# Patient Record
Sex: Male | Born: 1935
Health system: Southern US, Community
[De-identification: ages and names within clinical notes are randomized; demographics above are authoritative.]

## PROBLEM LIST (undated history)

## (undated) DIAGNOSIS — E785 Hyperlipidemia, unspecified: Secondary | ICD-10-CM

## (undated) DIAGNOSIS — K635 Polyp of colon: Secondary | ICD-10-CM

## (undated) DIAGNOSIS — M353 Polymyalgia rheumatica: Secondary | ICD-10-CM

## (undated) DIAGNOSIS — M1990 Unspecified osteoarthritis, unspecified site: Secondary | ICD-10-CM

## (undated) DIAGNOSIS — K759 Inflammatory liver disease, unspecified: Secondary | ICD-10-CM

## (undated) DIAGNOSIS — C61 Malignant neoplasm of prostate: Secondary | ICD-10-CM

## (undated) DIAGNOSIS — C4490 Unspecified malignant neoplasm of skin, unspecified: Secondary | ICD-10-CM

## (undated) DIAGNOSIS — K5790 Diverticulosis of intestine, part unspecified, without perforation or abscess without bleeding: Secondary | ICD-10-CM

## (undated) DIAGNOSIS — C439 Malignant melanoma of skin, unspecified: Secondary | ICD-10-CM

## (undated) DIAGNOSIS — I10 Essential (primary) hypertension: Secondary | ICD-10-CM

## (undated) DIAGNOSIS — E079 Disorder of thyroid, unspecified: Secondary | ICD-10-CM

## (undated) HISTORY — DX: Essential (primary) hypertension: I10

## (undated) HISTORY — PX: CATARACT EXTRACTION: SUR2

## (undated) HISTORY — DX: Unspecified malignant neoplasm of skin, unspecified: C44.90

## (undated) HISTORY — DX: Diverticulosis of intestine, part unspecified, without perforation or abscess without bleeding: K57.90

## (undated) HISTORY — DX: Unspecified osteoarthritis, unspecified site: M19.90

## (undated) HISTORY — DX: Hyperlipidemia, unspecified: E78.5

## (undated) HISTORY — DX: Malignant melanoma of skin, unspecified: C43.9

## (undated) HISTORY — PX: POLYPECTOMY: SHX149

## (undated) HISTORY — DX: Polyp of colon: K63.5

## (undated) HISTORY — DX: Malignant neoplasm of prostate: C61

## (undated) HISTORY — PX: TONSILLECTOMY: SUR1361

## (undated) HISTORY — DX: Disorder of thyroid, unspecified: E07.9

---

## 2004-10-06 ENCOUNTER — Ambulatory Visit: Payer: Self-pay | Admitting: Family Medicine

## 2004-10-13 ENCOUNTER — Ambulatory Visit: Payer: Self-pay | Admitting: Family Medicine

## 2005-02-17 ENCOUNTER — Ambulatory Visit: Payer: Self-pay | Admitting: Family Medicine

## 2005-07-28 ENCOUNTER — Ambulatory Visit: Payer: Self-pay | Admitting: Family Medicine

## 2005-09-19 DIAGNOSIS — C61 Malignant neoplasm of prostate: Secondary | ICD-10-CM

## 2005-09-19 HISTORY — DX: Malignant neoplasm of prostate: C61

## 2005-11-14 ENCOUNTER — Ambulatory Visit: Payer: Self-pay | Admitting: Family Medicine

## 2006-02-16 ENCOUNTER — Ambulatory Visit: Payer: Self-pay | Admitting: Family Medicine

## 2006-03-14 ENCOUNTER — Ambulatory Visit: Payer: Self-pay | Admitting: Family Medicine

## 2006-04-18 ENCOUNTER — Ambulatory Visit: Admission: RE | Admit: 2006-04-18 | Discharge: 2006-05-10 | Payer: Self-pay | Admitting: Radiation Oncology

## 2006-05-03 ENCOUNTER — Ambulatory Visit: Payer: Self-pay | Admitting: Family Medicine

## 2006-06-07 ENCOUNTER — Ambulatory Visit: Payer: Self-pay | Admitting: Family Medicine

## 2006-06-21 ENCOUNTER — Ambulatory Visit: Admission: RE | Admit: 2006-06-21 | Discharge: 2006-11-16 | Payer: Self-pay | Admitting: Radiation Oncology

## 2006-07-27 ENCOUNTER — Encounter: Admission: RE | Admit: 2006-07-27 | Discharge: 2006-07-27 | Payer: Self-pay | Admitting: Urology

## 2006-08-02 ENCOUNTER — Ambulatory Visit: Payer: Self-pay | Admitting: Family Medicine

## 2006-08-03 ENCOUNTER — Ambulatory Visit (HOSPITAL_BASED_OUTPATIENT_CLINIC_OR_DEPARTMENT_OTHER): Admission: RE | Admit: 2006-08-03 | Discharge: 2006-08-03 | Payer: Self-pay | Admitting: Urology

## 2007-01-02 ENCOUNTER — Ambulatory Visit: Payer: Self-pay | Admitting: Family Medicine

## 2007-02-05 ENCOUNTER — Ambulatory Visit: Payer: Self-pay | Admitting: Family Medicine

## 2007-02-06 LAB — CONVERTED CEMR LAB
ALT: 17 units/L (ref 0–40)
BUN: 12 mg/dL (ref 6–23)
CO2: 28 meq/L (ref 19–32)
Chloride: 109 meq/L (ref 96–112)
Creatinine, Ser: 1.1 mg/dL (ref 0.4–1.5)
Sodium: 141 meq/L (ref 135–145)

## 2007-02-28 ENCOUNTER — Ambulatory Visit: Payer: Self-pay | Admitting: Family Medicine

## 2007-02-28 DIAGNOSIS — M1990 Unspecified osteoarthritis, unspecified site: Secondary | ICD-10-CM | POA: Insufficient documentation

## 2007-02-28 DIAGNOSIS — E785 Hyperlipidemia, unspecified: Secondary | ICD-10-CM

## 2007-02-28 DIAGNOSIS — I10 Essential (primary) hypertension: Secondary | ICD-10-CM | POA: Insufficient documentation

## 2007-05-10 ENCOUNTER — Ambulatory Visit: Payer: Self-pay | Admitting: Gastroenterology

## 2007-06-11 ENCOUNTER — Telehealth (INDEPENDENT_AMBULATORY_CARE_PROVIDER_SITE_OTHER): Payer: Self-pay | Admitting: *Deleted

## 2007-06-18 ENCOUNTER — Ambulatory Visit: Payer: Self-pay | Admitting: Family Medicine

## 2007-06-18 DIAGNOSIS — Z8546 Personal history of malignant neoplasm of prostate: Secondary | ICD-10-CM

## 2007-06-18 DIAGNOSIS — M25519 Pain in unspecified shoulder: Secondary | ICD-10-CM

## 2007-06-27 ENCOUNTER — Ambulatory Visit: Payer: Self-pay | Admitting: Family Medicine

## 2007-07-06 ENCOUNTER — Encounter: Payer: Self-pay | Admitting: Family Medicine

## 2007-07-06 ENCOUNTER — Ambulatory Visit: Payer: Self-pay | Admitting: Gastroenterology

## 2007-07-13 ENCOUNTER — Encounter: Payer: Self-pay | Admitting: Family Medicine

## 2007-08-14 ENCOUNTER — Ambulatory Visit: Payer: Self-pay | Admitting: Family Medicine

## 2007-08-14 DIAGNOSIS — E041 Nontoxic single thyroid nodule: Secondary | ICD-10-CM

## 2007-08-17 LAB — CONVERTED CEMR LAB: T3, Free: 3 pg/mL (ref 2.3–4.2)

## 2007-08-20 ENCOUNTER — Encounter: Admission: RE | Admit: 2007-08-20 | Discharge: 2007-08-20 | Payer: Self-pay | Admitting: Family Medicine

## 2007-10-09 ENCOUNTER — Ambulatory Visit: Payer: Self-pay | Admitting: Family Medicine

## 2007-10-11 ENCOUNTER — Encounter: Payer: Self-pay | Admitting: Family Medicine

## 2007-10-15 LAB — CONVERTED CEMR LAB
BUN: 28 mg/dL — ABNORMAL HIGH (ref 6–23)
Hgb A1c MFr Bld: 5.9 % (ref 4.6–6.0)
Sodium: 142 meq/L (ref 135–145)

## 2007-12-25 ENCOUNTER — Ambulatory Visit: Payer: Self-pay | Admitting: Family Medicine

## 2007-12-26 ENCOUNTER — Encounter (INDEPENDENT_AMBULATORY_CARE_PROVIDER_SITE_OTHER): Payer: Self-pay | Admitting: *Deleted

## 2008-01-01 ENCOUNTER — Encounter (INDEPENDENT_AMBULATORY_CARE_PROVIDER_SITE_OTHER): Payer: Self-pay | Admitting: *Deleted

## 2008-02-27 ENCOUNTER — Encounter: Payer: Self-pay | Admitting: Family Medicine

## 2008-03-10 ENCOUNTER — Ambulatory Visit: Payer: Self-pay | Admitting: Internal Medicine

## 2008-03-10 DIAGNOSIS — M25559 Pain in unspecified hip: Secondary | ICD-10-CM

## 2008-03-12 ENCOUNTER — Encounter (INDEPENDENT_AMBULATORY_CARE_PROVIDER_SITE_OTHER): Payer: Self-pay | Admitting: *Deleted

## 2008-03-13 ENCOUNTER — Ambulatory Visit: Payer: Self-pay | Admitting: Family Medicine

## 2008-03-13 ENCOUNTER — Encounter (INDEPENDENT_AMBULATORY_CARE_PROVIDER_SITE_OTHER): Payer: Self-pay | Admitting: *Deleted

## 2008-03-13 DIAGNOSIS — H9190 Unspecified hearing loss, unspecified ear: Secondary | ICD-10-CM | POA: Insufficient documentation

## 2008-03-13 LAB — CONVERTED CEMR LAB
Chloride: 103 meq/L (ref 96–112)
Potassium: 3.6 meq/L (ref 3.5–5.1)
Sodium: 140 meq/L (ref 135–145)

## 2008-03-14 ENCOUNTER — Telehealth (INDEPENDENT_AMBULATORY_CARE_PROVIDER_SITE_OTHER): Payer: Self-pay | Admitting: *Deleted

## 2008-03-14 ENCOUNTER — Encounter: Admission: RE | Admit: 2008-03-14 | Discharge: 2008-03-14 | Payer: Self-pay | Admitting: Family Medicine

## 2008-03-25 ENCOUNTER — Encounter: Payer: Self-pay | Admitting: Internal Medicine

## 2008-05-13 ENCOUNTER — Encounter: Admission: RE | Admit: 2008-05-13 | Discharge: 2008-05-13 | Payer: Self-pay | Admitting: Orthopedic Surgery

## 2008-06-12 ENCOUNTER — Ambulatory Visit: Payer: Self-pay

## 2008-06-12 ENCOUNTER — Ambulatory Visit: Payer: Self-pay | Admitting: Family Medicine

## 2008-06-12 DIAGNOSIS — L03119 Cellulitis of unspecified part of limb: Secondary | ICD-10-CM

## 2008-06-12 DIAGNOSIS — L02419 Cutaneous abscess of limb, unspecified: Secondary | ICD-10-CM | POA: Insufficient documentation

## 2008-06-12 DIAGNOSIS — M79609 Pain in unspecified limb: Secondary | ICD-10-CM | POA: Insufficient documentation

## 2008-06-18 ENCOUNTER — Telehealth (INDEPENDENT_AMBULATORY_CARE_PROVIDER_SITE_OTHER): Payer: Self-pay | Admitting: *Deleted

## 2008-06-25 ENCOUNTER — Ambulatory Visit: Payer: Self-pay | Admitting: Family Medicine

## 2008-06-25 DIAGNOSIS — I8000 Phlebitis and thrombophlebitis of superficial vessels of unspecified lower extremity: Secondary | ICD-10-CM | POA: Insufficient documentation

## 2008-07-21 ENCOUNTER — Encounter: Payer: Self-pay | Admitting: Family Medicine

## 2008-07-22 ENCOUNTER — Ambulatory Visit: Payer: Self-pay | Admitting: Family Medicine

## 2008-07-22 ENCOUNTER — Telehealth (INDEPENDENT_AMBULATORY_CARE_PROVIDER_SITE_OTHER): Payer: Self-pay | Admitting: *Deleted

## 2008-07-27 LAB — CONVERTED CEMR LAB
AST: 20 units/L (ref 0–37)
Alkaline Phosphatase: 67 units/L (ref 39–117)
BUN: 22 mg/dL (ref 6–23)
Basophils Relative: 2.9 % (ref 0.0–3.0)
Bilirubin, Direct: 0.2 mg/dL (ref 0.0–0.3)
Calcium: 9.6 mg/dL (ref 8.4–10.5)
Cholesterol: 110 mg/dL (ref 0–200)
Creatinine, Ser: 1 mg/dL (ref 0.4–1.5)
Eosinophils Absolute: 0.1 10*3/uL (ref 0.0–0.7)
GFR calc Af Amer: 94 mL/min
Glucose, Bld: 87 mg/dL (ref 70–99)
HCT: 42.9 % (ref 39.0–52.0)
Hemoglobin: 14.7 g/dL (ref 13.0–17.0)
LDL Cholesterol: 51 mg/dL (ref 0–99)
Lymphocytes Relative: 29.4 % (ref 12.0–46.0)
MCV: 97.3 fL (ref 78.0–100.0)
Monocytes Relative: 5.4 % (ref 3.0–12.0)
RBC: 4.41 M/uL (ref 4.22–5.81)
RDW: 11.8 % (ref 11.5–14.6)
Total Bilirubin: 0.9 mg/dL (ref 0.3–1.2)
WBC: 5.5 10*3/uL (ref 4.5–10.5)

## 2008-07-29 ENCOUNTER — Encounter (INDEPENDENT_AMBULATORY_CARE_PROVIDER_SITE_OTHER): Payer: Self-pay | Admitting: *Deleted

## 2008-08-27 ENCOUNTER — Ambulatory Visit: Payer: Self-pay | Admitting: Internal Medicine

## 2008-08-27 ENCOUNTER — Encounter (INDEPENDENT_AMBULATORY_CARE_PROVIDER_SITE_OTHER): Payer: Self-pay | Admitting: Orthopedic Surgery

## 2008-08-27 ENCOUNTER — Inpatient Hospital Stay (HOSPITAL_COMMUNITY): Admission: RE | Admit: 2008-08-27 | Discharge: 2008-08-31 | Payer: Self-pay | Admitting: Orthopedic Surgery

## 2008-08-27 ENCOUNTER — Telehealth: Payer: Self-pay | Admitting: Internal Medicine

## 2008-08-27 HISTORY — PX: TOTAL HIP ARTHROPLASTY: SHX124

## 2008-10-21 ENCOUNTER — Ambulatory Visit: Payer: Self-pay | Admitting: Family Medicine

## 2008-10-28 ENCOUNTER — Encounter: Payer: Self-pay | Admitting: Family Medicine

## 2008-10-30 ENCOUNTER — Ambulatory Visit: Payer: Self-pay | Admitting: Family Medicine

## 2008-10-31 ENCOUNTER — Ambulatory Visit: Payer: Self-pay | Admitting: Family Medicine

## 2008-11-07 ENCOUNTER — Encounter: Payer: Self-pay | Admitting: Family Medicine

## 2008-11-13 ENCOUNTER — Ambulatory Visit: Payer: Self-pay | Admitting: Family Medicine

## 2008-11-13 LAB — CONVERTED CEMR LAB
ALT: 14 U/L
AST: 18 U/L
Albumin: 3.8 g/dL
Alkaline Phosphatase: 64 U/L
BUN: 20 mg/dL
Bilirubin, Direct: 0.1 mg/dL
CO2: 29 meq/L
Calcium: 9.4 mg/dL
Chloride: 103 meq/L
Cholesterol: 110 mg/dL
Creatinine, Ser: 1.1 mg/dL
GFR calc Af Amer: 84 mL/min
GFR calc non Af Amer: 70 mL/min
Glucose, Bld: 111 mg/dL — ABNORMAL HIGH
HDL: 45.5 mg/dL
LDL Cholesterol: 49 mg/dL
Potassium: 3.6 meq/L
Sodium: 141 meq/L
Total Bilirubin: 0.9 mg/dL
Total CHOL/HDL Ratio: 2.4
Total Protein: 6.1 g/dL
Triglycerides: 76 mg/dL
VLDL: 15 mg/dL

## 2008-11-17 ENCOUNTER — Encounter: Admission: RE | Admit: 2008-11-17 | Discharge: 2008-11-17 | Payer: Self-pay | Admitting: Family Medicine

## 2008-11-18 ENCOUNTER — Encounter: Payer: Self-pay | Admitting: Family Medicine

## 2008-11-20 ENCOUNTER — Encounter (INDEPENDENT_AMBULATORY_CARE_PROVIDER_SITE_OTHER): Payer: Self-pay | Admitting: *Deleted

## 2008-12-11 ENCOUNTER — Ambulatory Visit: Payer: Self-pay | Admitting: Family Medicine

## 2008-12-31 ENCOUNTER — Inpatient Hospital Stay (HOSPITAL_COMMUNITY): Admission: RE | Admit: 2008-12-31 | Discharge: 2009-01-04 | Payer: Self-pay | Admitting: Orthopedic Surgery

## 2008-12-31 HISTORY — PX: TOTAL KNEE ARTHROPLASTY: SHX125

## 2009-01-13 ENCOUNTER — Encounter: Payer: Self-pay | Admitting: Family Medicine

## 2009-01-14 ENCOUNTER — Encounter: Payer: Self-pay | Admitting: Family Medicine

## 2009-01-22 ENCOUNTER — Ambulatory Visit: Payer: Self-pay | Admitting: Family Medicine

## 2009-01-22 DIAGNOSIS — Z96659 Presence of unspecified artificial knee joint: Secondary | ICD-10-CM

## 2009-01-22 LAB — CONVERTED CEMR LAB

## 2009-03-03 ENCOUNTER — Telehealth (INDEPENDENT_AMBULATORY_CARE_PROVIDER_SITE_OTHER): Payer: Self-pay | Admitting: *Deleted

## 2009-05-08 ENCOUNTER — Encounter: Payer: Self-pay | Admitting: Family Medicine

## 2009-05-14 ENCOUNTER — Ambulatory Visit: Payer: Self-pay | Admitting: Family Medicine

## 2009-05-26 ENCOUNTER — Encounter (INDEPENDENT_AMBULATORY_CARE_PROVIDER_SITE_OTHER): Payer: Self-pay | Admitting: *Deleted

## 2009-05-26 LAB — CONVERTED CEMR LAB
ALT: 19 units/L (ref 0–53)
AST: 24 units/L (ref 0–37)
Albumin: 4 g/dL (ref 3.5–5.2)
BUN: 24 mg/dL — ABNORMAL HIGH (ref 6–23)
Bilirubin, Direct: 0.1 mg/dL (ref 0.0–0.3)
Calcium: 9.1 mg/dL (ref 8.4–10.5)
GFR calc non Af Amer: 62.68 mL/min (ref >60)
HDL: 46.3 mg/dL (ref >39.00)
Sodium: 142 meq/L (ref 135–145)
Total Bilirubin: 1.2 mg/dL (ref 0.3–1.2)
Total Protein: 6.7 g/dL (ref 6.0–8.3)
Triglycerides: 47 mg/dL (ref 0.0–149.0)
VLDL: 9.4 mg/dL (ref 0.0–40.0)

## 2009-06-03 ENCOUNTER — Telehealth: Payer: Self-pay | Admitting: Family Medicine

## 2009-06-15 ENCOUNTER — Encounter: Payer: Self-pay | Admitting: Family Medicine

## 2009-06-19 ENCOUNTER — Telehealth (INDEPENDENT_AMBULATORY_CARE_PROVIDER_SITE_OTHER): Payer: Self-pay | Admitting: *Deleted

## 2009-06-24 ENCOUNTER — Telehealth (INDEPENDENT_AMBULATORY_CARE_PROVIDER_SITE_OTHER): Payer: Self-pay | Admitting: *Deleted

## 2009-10-02 ENCOUNTER — Ambulatory Visit: Payer: Self-pay | Admitting: Family Medicine

## 2009-11-13 ENCOUNTER — Encounter: Payer: Self-pay | Admitting: Family Medicine

## 2009-11-23 ENCOUNTER — Encounter: Payer: Self-pay | Admitting: Family Medicine

## 2009-12-09 ENCOUNTER — Telehealth (INDEPENDENT_AMBULATORY_CARE_PROVIDER_SITE_OTHER): Payer: Self-pay | Admitting: *Deleted

## 2010-03-08 ENCOUNTER — Ambulatory Visit: Payer: Self-pay | Admitting: Family Medicine

## 2010-03-08 DIAGNOSIS — IMO0001 Reserved for inherently not codable concepts without codable children: Secondary | ICD-10-CM | POA: Insufficient documentation

## 2010-03-09 ENCOUNTER — Telehealth: Payer: Self-pay | Admitting: Family Medicine

## 2010-03-09 LAB — CONVERTED CEMR LAB
ALT: 30 units/L (ref 0–53)
AST: 32 units/L (ref 0–37)
Albumin: 3.8 g/dL (ref 3.5–5.2)
Alkaline Phosphatase: 90 units/L (ref 39–117)
BUN: 21 mg/dL (ref 6–23)
Basophils Absolute: 0 10*3/uL (ref 0.0–0.1)
Basophils Relative: 0.2 % (ref 0.0–3.0)
Bilirubin, Direct: 0.2 mg/dL (ref 0.0–0.3)
CO2: 30 meq/L (ref 19–32)
Calcium: 9.4 mg/dL (ref 8.4–10.5)
Chloride: 107 meq/L (ref 96–112)
Creatinine, Ser: 1 mg/dL (ref 0.4–1.5)
Eosinophils Absolute: 0.1 10*3/uL (ref 0.0–0.7)
Eosinophils Relative: 2.3 % (ref 0.0–5.0)
GFR calc non Af Amer: 73.78 mL/min (ref >60)
Glucose, Bld: 99 mg/dL (ref 70–99)
HCT: 37.9 % — ABNORMAL LOW (ref 39.0–52.0)
Hemoglobin: 12.8 g/dL — ABNORMAL LOW (ref 13.0–17.0)
Lymphocytes Relative: 25.8 % (ref 12.0–46.0)
Lymphs Abs: 1.6 10*3/uL (ref 0.7–4.0)
MCHC: 33.7 g/dL (ref 30.0–36.0)
MCV: 94.8 fL (ref 78.0–100.0)
Monocytes Absolute: 0.5 10*3/uL (ref 0.1–1.0)
Monocytes Relative: 8.3 % (ref 3.0–12.0)
Neutro Abs: 3.9 10*3/uL (ref 1.4–7.7)
Neutrophils Relative %: 63.4 % (ref 43.0–77.0)
Platelets: 211 10*3/uL (ref 150.0–400.0)
Potassium: 4.5 meq/L (ref 3.5–5.1)
RBC: 4 M/uL — ABNORMAL LOW (ref 4.22–5.81)
RDW: 13.7 % (ref 11.5–14.6)
Rheumatoid fact SerPl-aCnc: 24.7 intl units/mL — ABNORMAL HIGH (ref 0.0–20.0)
Sed Rate: 34 mm/hr — ABNORMAL HIGH (ref 0–22)
Sodium: 143 meq/L (ref 135–145)
Total Bilirubin: 0.5 mg/dL (ref 0.3–1.2)
Total CK: 59 units/L (ref 7–232)
Total Protein: 6.7 g/dL (ref 6.0–8.3)
WBC: 6.2 10*3/uL (ref 4.5–10.5)

## 2010-03-10 ENCOUNTER — Ambulatory Visit: Payer: Self-pay | Admitting: Family Medicine

## 2010-03-23 ENCOUNTER — Ambulatory Visit: Payer: Self-pay | Admitting: Family Medicine

## 2010-03-31 ENCOUNTER — Emergency Department (HOSPITAL_BASED_OUTPATIENT_CLINIC_OR_DEPARTMENT_OTHER): Admission: EM | Admit: 2010-03-31 | Discharge: 2010-03-31 | Payer: Self-pay | Admitting: Emergency Medicine

## 2010-04-06 ENCOUNTER — Ambulatory Visit: Payer: Self-pay | Admitting: Family Medicine

## 2010-04-12 ENCOUNTER — Ambulatory Visit: Payer: Self-pay | Admitting: Family Medicine

## 2010-04-13 ENCOUNTER — Encounter: Payer: Self-pay | Admitting: Family Medicine

## 2010-04-14 LAB — CONVERTED CEMR LAB: Vit D, 25-Hydroxy: 36 ng/mL (ref 30–89)

## 2010-04-19 ENCOUNTER — Encounter: Admission: RE | Admit: 2010-04-19 | Discharge: 2010-05-28 | Payer: Self-pay | Admitting: Family Medicine

## 2010-04-21 ENCOUNTER — Encounter: Payer: Self-pay | Admitting: Family Medicine

## 2010-04-27 ENCOUNTER — Ambulatory Visit: Payer: Self-pay | Admitting: Family Medicine

## 2010-04-27 ENCOUNTER — Encounter (INDEPENDENT_AMBULATORY_CARE_PROVIDER_SITE_OTHER): Payer: Self-pay | Admitting: *Deleted

## 2010-04-28 ENCOUNTER — Encounter (INDEPENDENT_AMBULATORY_CARE_PROVIDER_SITE_OTHER): Payer: Self-pay | Admitting: *Deleted

## 2010-04-28 DIAGNOSIS — K921 Melena: Secondary | ICD-10-CM

## 2010-05-08 ENCOUNTER — Encounter: Payer: Self-pay | Admitting: Family Medicine

## 2010-05-10 ENCOUNTER — Ambulatory Visit: Payer: Self-pay | Admitting: Family Medicine

## 2010-05-10 DIAGNOSIS — M2550 Pain in unspecified joint: Secondary | ICD-10-CM | POA: Insufficient documentation

## 2010-05-10 DIAGNOSIS — R609 Edema, unspecified: Secondary | ICD-10-CM

## 2010-05-11 ENCOUNTER — Encounter: Payer: Self-pay | Admitting: Family Medicine

## 2010-05-11 LAB — CONVERTED CEMR LAB: Anti Nuclear Antibody(ANA): NEGATIVE

## 2010-05-12 ENCOUNTER — Encounter: Payer: Self-pay | Admitting: Family Medicine

## 2010-05-12 LAB — CONVERTED CEMR LAB
ALT: 23 units/L (ref 0–53)
Albumin: 3.6 g/dL (ref 3.5–5.2)
Chloride: 106 meq/L (ref 96–112)
Creatinine, Ser: 1 mg/dL (ref 0.4–1.5)
Eosinophils Absolute: 0.1 10*3/uL (ref 0.0–0.7)
Eosinophils Relative: 0.6 % (ref 0.0–5.0)
Glucose, Bld: 101 mg/dL — ABNORMAL HIGH (ref 70–99)
HCT: 34.3 % — ABNORMAL LOW (ref 39.0–52.0)
Hemoglobin: 11.6 g/dL — ABNORMAL LOW (ref 13.0–17.0)
Lymphocytes Relative: 13.7 % (ref 12.0–46.0)
Lymphs Abs: 1.1 10*3/uL (ref 0.7–4.0)
MCHC: 34 g/dL (ref 30.0–36.0)
Monocytes Relative: 5.8 % (ref 3.0–12.0)
Neutrophils Relative %: 79.7 % — ABNORMAL HIGH (ref 43.0–77.0)
Platelets: 242 10*3/uL (ref 150.0–400.0)
Sodium: 142 meq/L (ref 135–145)
TSH: 1.63 microintl units/mL (ref 0.35–5.50)
WBC: 8 10*3/uL (ref 4.5–10.5)

## 2010-05-14 ENCOUNTER — Encounter: Payer: Self-pay | Admitting: Family Medicine

## 2010-05-17 ENCOUNTER — Encounter: Payer: Self-pay | Admitting: Family Medicine

## 2010-05-18 ENCOUNTER — Encounter: Payer: Self-pay | Admitting: Family Medicine

## 2010-05-18 DIAGNOSIS — E559 Vitamin D deficiency, unspecified: Secondary | ICD-10-CM

## 2010-05-25 ENCOUNTER — Encounter: Payer: Self-pay | Admitting: Family Medicine

## 2010-06-03 ENCOUNTER — Ambulatory Visit: Payer: Self-pay | Admitting: Family Medicine

## 2010-06-09 DIAGNOSIS — K644 Residual hemorrhoidal skin tags: Secondary | ICD-10-CM | POA: Insufficient documentation

## 2010-06-09 DIAGNOSIS — K5730 Diverticulosis of large intestine without perforation or abscess without bleeding: Secondary | ICD-10-CM | POA: Insufficient documentation

## 2010-06-09 DIAGNOSIS — Z860100 Personal history of colon polyps, unspecified: Secondary | ICD-10-CM | POA: Insufficient documentation

## 2010-06-10 ENCOUNTER — Encounter (INDEPENDENT_AMBULATORY_CARE_PROVIDER_SITE_OTHER): Payer: Self-pay | Admitting: *Deleted

## 2010-06-10 ENCOUNTER — Ambulatory Visit: Payer: Self-pay | Admitting: Gastroenterology

## 2010-06-22 ENCOUNTER — Ambulatory Visit: Payer: Self-pay | Admitting: Gastroenterology

## 2010-06-22 LAB — CONVERTED CEMR LAB: Fecal Occult Bld: NEGATIVE

## 2010-07-02 ENCOUNTER — Telehealth: Payer: Self-pay | Admitting: Family Medicine

## 2010-07-29 ENCOUNTER — Telehealth: Payer: Self-pay | Admitting: Family Medicine

## 2010-07-29 ENCOUNTER — Encounter (INDEPENDENT_AMBULATORY_CARE_PROVIDER_SITE_OTHER): Payer: Self-pay | Admitting: *Deleted

## 2010-08-05 ENCOUNTER — Ambulatory Visit: Payer: Self-pay | Admitting: Family Medicine

## 2010-08-09 LAB — CONVERTED CEMR LAB
Alkaline Phosphatase: 51 units/L (ref 39–117)
BUN: 23 mg/dL (ref 6–23)
Basophils Relative: 0.5 % (ref 0.0–3.0)
Bilirubin, Direct: 0.2 mg/dL (ref 0.0–0.3)
CO2: 26 meq/L (ref 19–32)
Calcium: 9.2 mg/dL (ref 8.4–10.5)
Cholesterol: 126 mg/dL (ref 0–200)
Creatinine, Ser: 1.3 mg/dL (ref 0.4–1.5)
Eosinophils Absolute: 0.1 10*3/uL (ref 0.0–0.7)
Eosinophils Relative: 1.8 % (ref 0.0–5.0)
Glucose, Bld: 95 mg/dL (ref 70–99)
HCT: 41.8 % (ref 39.0–52.0)
HDL: 53.2 mg/dL (ref >39.00)
Lymphocytes Relative: 26.7 % (ref 12.0–46.0)
Monocytes Relative: 9.4 % (ref 3.0–12.0)
Neutrophils Relative %: 61.6 % (ref 43.0–77.0)
Platelets: 170 10*3/uL (ref 150.0–400.0)
RBC: 4.51 M/uL (ref 4.22–5.81)
RDW: 18.7 % — ABNORMAL HIGH (ref 11.5–14.6)
Total CHOL/HDL Ratio: 2
Transferrin: 311.4 mg/dL (ref 212.0–360.0)
VLDL: 14.4 mg/dL (ref 0.0–40.0)
Vitamin B-12: 334 pg/mL (ref 211–911)

## 2010-08-26 ENCOUNTER — Ambulatory Visit: Payer: Self-pay | Admitting: Family Medicine

## 2010-10-17 LAB — CONVERTED CEMR LAB
AST: 17 units/L (ref 0–37)
AST: 26 units/L (ref 0–37)
Albumin: 3.8 g/dL (ref 3.5–5.2)
Albumin: 4.2 g/dL (ref 3.5–5.2)
Alkaline Phosphatase: 53 units/L (ref 39–117)
BUN: 22 mg/dL (ref 6–23)
Basophils Relative: 0.2 % (ref 0.0–1.0)
Basophils Relative: 0.2 % (ref 0.0–3.0)
Bilirubin Urine: NEGATIVE
Bilirubin, Direct: 0.2 mg/dL (ref 0.0–0.3)
Bilirubin, Direct: 0.2 mg/dL (ref 0.0–0.3)
CO2: 33 meq/L — ABNORMAL HIGH (ref 19–32)
Chloride: 105 meq/L (ref 96–112)
Cholesterol: 111 mg/dL (ref 0–200)
Creatinine, Ser: 1.1 mg/dL (ref 0.4–1.5)
Creatinine, Ser: 1.1 mg/dL (ref 0.4–1.5)
Eosinophils Absolute: 0.1 10*3/uL (ref 0.0–0.7)
Eosinophils Relative: 1.8 % (ref 0.0–5.0)
GFR calc non Af Amer: 70 mL/min
Glucose, Bld: 126 mg/dL — ABNORMAL HIGH (ref 70–99)
Glucose, Bld: 98 mg/dL (ref 70–99)
Glucose, Urine, Semiquant: NEGATIVE
HCT: 43.2 % (ref 39.0–52.0)
HDL: 32.3 mg/dL — ABNORMAL LOW (ref >39.00)
Hemoglobin: 12.6 g/dL — ABNORMAL LOW (ref 13.0–17.0)
Hemoglobin: 15.4 g/dL (ref 13.0–17.0)
Ketones, urine, test strip: NEGATIVE
LDL Cholesterol: 65 mg/dL (ref 0–99)
Lymphocytes Relative: 16.4 % (ref 12.0–46.0)
Lymphocytes Relative: 28.4 % (ref 12.0–46.0)
Lymphs Abs: 1.1 10*3/uL (ref 0.7–4.0)
MCHC: 34.4 g/dL (ref 30.0–36.0)
MCV: 92.2 fL (ref 78.0–100.0)
Monocytes Absolute: 0.6 10*3/uL (ref 0.1–1.0)
Monocytes Relative: 9.8 % (ref 3.0–12.0)
Neutrophils Relative %: 71.8 % (ref 43.0–77.0)
Platelets: 155 10*3/uL (ref 150–400)
Potassium: 3.7 meq/L (ref 3.5–5.1)
Potassium: 4.4 meq/L (ref 3.5–5.1)
Protein, U semiquant: NEGATIVE
RBC: 4.55 M/uL (ref 4.22–5.81)
Sed Rate: 32 mm/hr — ABNORMAL HIGH (ref 0–22)
Sodium: 139 meq/L (ref 135–145)
TSH: 1.04 microintl units/mL (ref 0.35–5.50)
Total Bilirubin: 0.7 mg/dL (ref 0.3–1.2)
Total Protein: 6.5 g/dL (ref 6.0–8.3)
Triglycerides: 71 mg/dL (ref 0.0–149.0)
WBC: 4.8 10*3/uL (ref 4.5–10.5)
pH: 5

## 2010-10-19 NOTE — Miscellaneous (Signed)
   Clinical Lists Changes  Problems: Added new problem of UNSPECIFIED VITAMIN D DEFICIENCY (ICD-268.9)

## 2010-10-19 NOTE — Assessment & Plan Note (Signed)
Summary: FOLLOW -UP AND LABS--PH   Vital Signs:  Patient profile:   77 year old male Height:      72.25 inches Weight:      240 pounds Temp:     98.5 degrees F oral Pulse rate:   78 / minute BP sitting:   130 / 78  (left arm)  Vitals Entered By: Jeremy Johann CMA (April 06, 2010 8:11 AM) CC: f/u   History of Present Illness: Pt here f/u being off niaspan and crestor.   Muscle aches are slightly better but still there esp in am and late evening.   Pt was in ER last wednesday for fecal impaction.  Pt taking fiber and drinking fluids.    Current Medications (verified): 1)  Hytrin 1 Mg Caps (Terazosin Hcl) .Marland Kitchen.. 1 By Mouth Qhs 2)  Lisinopril 10 Mg Tabs (Lisinopril) .... Take 1 Tab Once Daily 3)  Eq Aspirin 325 Mg Tabs (Aspirin) .... Take 1 Tab Once Daily 4)  Mvi-Centrum .Marland Kitchen.. 1 By Mouth Once Daily 5)  Vitamin D3 1000 Unit Tabs (Cholecalciferol) .Marland Kitchen.. 1 By Mouth Qam 6)  Crestor 20 Mg Tabs (Rosuvastatin Calcium) .Marland Kitchen.. 1 By Mouth Qpm 7)  Mobic 15 Mg Tabs (Meloxicam) .... 1/2 -1 By Mouth Once Daily  Allergies (verified): No Known Drug Allergies  Past History:  Past medical, surgical, family and social histories (including risk factors) reviewed for relevance to current acute and chronic problems.  Past Medical History: Reviewed history from 03/13/2008 and no changes required. Hyperlipidemia Hypertension Osteoarthritis Prostate cancer, hx of--s/p radiation seed implants.  Past Surgical History: Reviewed history from 03/08/2010 and no changes required. Tonsillectomy Total knee replacement (12/31/2008) Total hip replacement (08/27/2008)  Family History: Reviewed history from 06/18/2007 and no changes required. Family History of CAD Male 1st degree relative 54yo---mother Family History of CAD Male 1st degree relative- 66--- father father-- pancreatic cancer brother-- pancreatic ca 13yo  Social History: Reviewed history from 06/18/2007 and no changes  required. 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 Alert and cooperative with normal attention span and concentration. No apparent delusions, illusions, hallucinations   Impression & Recommendations:  Problem # 1:  MYALGIA (ICD-729.1) Assessment Improved  The following medications were removed from the medication list:    Ultram 50 Mg Tabs (Tramadol hcl) .Marland Kitchen... 1 -2 by mouth every 6 hours hours as needed pain His updated medication list for this problem includes:    Eq Aspirin 325 Mg Tabs (Aspirin) .Marland Kitchen... Take 1 tab once daily    Mobic 15 Mg Tabs (Meloxicam) .Marland Kitchen... 1/2 -1 by mouth once daily consider rheum referral if no improvement  Problem # 2:  HYPERLIPIDEMIA (ICD-272.4)  The following medications were removed from the medication list:    Niaspan 1000 Mg Tbcr (Niacin (antihyperlipidemic)) .Marland Kitchen... 1 once daily    Crestor 20 Mg Tabs (Rosuvastatin calcium) .Marland Kitchen... 1 by mouth at bedtime His updated medication list for this problem includes:    Crestor 20 Mg Tabs (Rosuvastatin calcium) .Marland Kitchen... 1 by mouth qpm  Labs Reviewed: SGOT: 32 (03/08/2010)   SGPT: 30 (03/08/2010)   HDL:46.30 (05/14/2009), 45.5 (10/31/2008)  LDL:46 (05/14/2009), 49 (10/31/2008)  Chol:102 (05/14/2009), 110 (10/31/2008)  Trig:47.0 (05/14/2009), 76 (10/31/2008)  Complete Medication List: 1)  Hytrin 1 Mg Caps (Terazosin hcl) .Marland KitchenMarland KitchenMarland Kitchen  1 by mouth qhs 2)  Lisinopril 10 Mg Tabs (Lisinopril) .... Take 1 tab once daily 3)  Eq Aspirin 325 Mg Tabs (Aspirin) .... Take 1 tab once daily 4)  Mvi-centrum  .Marland KitchenMarland Kitchen. 1 by mouth once daily 5)  Vitamin D3 1000 Unit Tabs (Cholecalciferol) .Marland Kitchen.. 1 by mouth qam 6)  Crestor 20 Mg Tabs (Rosuvastatin calcium) .Marland Kitchen.. 1 by mouth  qpm 7)  Mobic 15 Mg Tabs (Meloxicam) .... 1/2 -1 by mouth once daily  Patient Instructions: 1)  start crestor again 2)  we will start mobic for your arthritis pain---take otc pepcid or prilosec etc with med each day 3)  check labs in 36months----272.4  lipid, bmp, hep 729.1  ESR Prescriptions: MOBIC 15 MG TABS (MELOXICAM) 1/2 -1 by mouth once daily  #30 x 2   Entered and Authorized by:   Loreen Freud DO   Signed by:   Loreen Freud DO on 04/06/2010   Method used:   [facility] to        [facility] [phone]* (retail)       [address]       [place], [place]  [address]       Ph: [phone]       Fax: [phone]   RxID:   [id number] CRESTOR 20 MG TABS (ROSUVASTATIN CALCIUM) 1 by mouth qpm  #30 x 0   Entered and Authorized by:   Loreen Freud DO   Signed by:   Loreen Freud DO on 04/06/2010   Method used:   Historical   RxID:   [id number]

## 2010-10-19 NOTE — Letter (Signed)
[facility]   Imported By: Sherian Rein 05/27/2010 12:16:46  _____________________________________________________________________  External Attachment:    Type:   Image     Comment:   External Document

## 2010-10-19 NOTE — Assessment & Plan Note (Signed)
Summary: swelling hands,feet/cbs   Vital Signs:  Patient profile:   77 year old male Weight:      243 pounds Temp:     98.4 degrees F BP sitting:   120 / 90  (right arm)  Vitals Entered By: Almeta Monas CMA Duncan Dull) (May 10, 2010 11:26 AM) CC: c/o swelling to the hands and feet x2wks, also needs labs pt  is fasting   History of Present Illness: Pt is here c/o swelling in hands and feet about 2 weeks ago.  He went to ortho for hip injection and ortho saw it and wants him to see rheum and have labs done.  Swelling has gone down since.  No other complaints.    Current Medications (verified): 1)  Hytrin 1 Mg Caps (Terazosin Hcl) .Marland Kitchen.. 1 By Mouth Qhs 2)  Lisinopril-Hydrochlorothiazide 10-12.5 Mg Tabs (Lisinopril-Hydrochlorothiazide) .Marland Kitchen.. 1 By Mouth Once Daily 3)  Eq Aspirin 325 Mg Tabs (Aspirin) .... Take 1 Tab Once Daily 4)  Mvi-Centrum .Marland Kitchen.. 1 By Mouth Once Daily 5)  Vitamin D3 1000 Unit Tabs (Cholecalciferol) .Marland Kitchen.. 1 By Mouth Qam 6)  Crestor 20 Mg Tabs (Rosuvastatin Calcium) .Marland Kitchen.. 1 By Mouth Qpm 7)  Mobic 15 Mg Tabs (Meloxicam) .... 1/2 -1 By Mouth Once Daily 8)  Prilosec Otc 20 Mg Tbec (Omeprazole Magnesium) .... Once Daily  Allergies (verified): No Known Drug Allergies  Past History:  Past medical, surgical, family and social histories (including risk factors) reviewed for relevance to current acute and chronic problems.  Past Medical History: Reviewed history from 03/13/2008 and no changes required. Hyperlipidemia Hypertension Osteoarthritis Prostate cancer, hx of--s/p radiation seed implants.  Past Surgical History: Reviewed history from 03/08/2010 and no changes required. Tonsillectomy Total knee replacement (12/31/2008) Total hip replacement (08/27/2008)  Family History: Reviewed history from 06/18/2007 and no changes required. Family History of CAD Male 1st degree relative 54yo---mother Family History of CAD Male 1st degree relative- 13--- father father--  pancreatic cancer brother-- pancreatic ca 63yo  Social History: Reviewed history from 06/18/2007 and no changes required. Retired--Marine,  [employer] Development worker, international aid) Married Never Smoked Alcohol use-yes Drug use-no Regular exercise-yes  Review of Systems      See HPI  Physical Exam  General:  Well-developed,well-nourished,in no acute distress; alert,appropriate and cooperative throughout examination Lungs:  Normal respiratory effort, chest expands symmetrically. Lungs are clear to auscultation, no crackles or wheezes. Extremities:  left pretibial edema and right pretibial edema.  -- trace Psych:  Oriented X3 and normally interactive.     Impression & Recommendations:  Problem # 1:  EDEMA (ICD-782.3) Assessment Improved  His updated medication list for this problem includes:    Lisinopril-hydrochlorothiazide 10-12.5 Mg Tabs (Lisinopril-hydrochlorothiazide) .Marland Kitchen... 1 by mouth once daily  Orders: TLB-BMP (Basic Metabolic Panel-BMET) (80048-METABOL) TLB-CBC Platelet - w/Differential (85025-CBCD) TLB-Hepatic/Liver Function Pnl (80076-HEPATIC) TLB-TSH (Thyroid Stimulating Hormone) (84443-TSH) TLB-CK Total Only(Creatine Kinase/CPK) (82550-CK) TLB-Rheumatoid Factor (RA) (13086-VH) TLB-Sedimentation Rate (ESR) (85652-ESR) T-Antinuclear Antib (ANA) (84696-29528) Rheumatology Referral (Rheumatology)  Discussed elevation of the legs, use of compression stockings, sodium restiction, and medication use.   Problem # 2:  PAIN IN JOINT, MULTIPLE SITES (ICD-719.49)  Orders: TLB-BMP (Basic Metabolic Panel-BMET) (80048-METABOL) TLB-CBC Platelet - w/Differential (85025-CBCD) TLB-Hepatic/Liver Function Pnl (80076-HEPATIC) TLB-TSH (Thyroid Stimulating Hormone) (84443-TSH) TLB-CK Total Only(Creatine Kinase/CPK) (82550-CK) TLB-Rheumatoid Factor (RA) (41324-MW) TLB-Sedimentation Rate (ESR) (85652-ESR) T-Antinuclear Antib (ANA) (10272-53664) Rheumatology Referral (Rheumatology)  Complete Medication  List: 1)  Hytrin 1 Mg Caps (Terazosin hcl) .Marland Kitchen.. 1 by mouth qhs 2)  Lisinopril-hydrochlorothiazide 10-12.5 Mg Tabs (Lisinopril-hydrochlorothiazide) .Marland KitchenMarland KitchenMarland Kitchen  1 by mouth once daily 3)  Eq Aspirin 325 Mg Tabs (Aspirin) .... Take 1 tab once daily 4)  Mvi-centrum  .Marland KitchenMarland Kitchen. 1 by mouth once daily 5)  Vitamin D3 1000 Unit Tabs (Cholecalciferol) .Marland Kitchen.. 1 by mouth qam 6)  Crestor 20 Mg Tabs (Rosuvastatin calcium) .Marland Kitchen.. 1 by mouth qpm 7)  Mobic 15 Mg Tabs (Meloxicam) .... 1/2 -1 by mouth once daily 8)  Prilosec Otc 20 Mg Tbec (Omeprazole magnesium) .... Once daily  Other Orders: Venipuncture ([address]) Prescriptions: LISINOPRIL-HYDROCHLOROTHIAZIDE 10-12.5 MG TABS (LISINOPRIL-HYDROCHLOROTHIAZIDE) 1 by mouth once daily  #30 x 2   Entered and Authorized by:   Loreen Freud DO   Signed by:   Loreen Freud DO on 05/10/2010   Method used:   Print then Give to Patient   RxID:   [id number]   Appended Document: swelling hands,feet/cbs     Clinical Lists Changes  Orders: Added new Service order of Specimen Handling (95621) - Signed

## 2010-10-19 NOTE — Consult Note (Signed)
Summary: [facility]   Imported By: Lanelle Bal 05/27/2010 12:37:07  _____________________________________________________________________  External Attachment:    Type:   Image     Comment:   External Document

## 2010-10-19 NOTE — Progress Notes (Signed)
Summary: Refill Request  Phone Note Refill Request Message from:  [facility] Fax #: [phone]  Refills Requested: Medication #1:  LISINOPRIL-HYDROCHLOROTHIAZIDE 10-12.5 MG TABS 1 by mouth once daily   Dosage confirmed as above?Dosage Confirmed   Supply Requested: 3 months   Notes: 3 refills Next Appointment Scheduled: none Initial call taken by: Harold Barban,  December 09, 2009 8:24 AM    Prescriptions: LISINOPRIL-HYDROCHLOROTHIAZIDE 10-12.5 MG TABS (LISINOPRIL-HYDROCHLOROTHIAZIDE) 1 by mouth once daily  #90 x 0   Entered by:   Army Fossa CMA   Authorized by:   Loreen Freud DO   Signed by:   Army Fossa CMA on 12/09/2009   Method used:   Re-Faxed to ...       [facility])       [address] [address]       [place], [place]  [address]       Ph: [phone]       Fax: [phone]   RxID:   [id number] LISINOPRIL-HYDROCHLOROTHIAZIDE 10-12.5 MG TABS (LISINOPRIL-HYDROCHLOROTHIAZIDE) 1 by mouth once daily  #90 x 0   Entered by:   Army Fossa CMA   Authorized by:   Loreen Freud DO   Signed by:   Army Fossa CMA on 12/09/2009   Method used:   Print then Give to Patient   RxID:   [id number]

## 2010-10-19 NOTE — Letter (Signed)
Summary: [facility]: Immunoassay Fecal Occult Blood (iFOB) Order [facility]  [address] [place], [place] [address]   Phone: [phone]  Fax: [phone]      [facility]: Immunoassay Fecal Occult Blood (iFOB) Order Form   June 10, 2010 MRN: [id number]   Andrew Ramos June 10, 1932   Physicican Name:Dr. Sheryn Bison  Diagnosis Code:792.1/564.0      [name] CMA (AAMA)

## 2010-10-19 NOTE — Assessment & Plan Note (Signed)
Summary: COUGH, RUNNY NOSE, NO FEVER///SPH   Vital Signs:  Patient profile:   77 year old male Weight:      240.2 pounds O2 Sat:      96 % Temp:     97.9 degrees F oral Pulse rate:   76 / minute Pulse rhythm:   regular BP sitting:   120 / 78  (left arm)  Vitals Entered By: Almeta Monas CMA Duncan Dull) (June 03, 2010 3:39 PM) CC: c/o cough, congestion, coughing up phelgm, runny nose and nasal drainage, URI symptoms   History of Present Illness:       This is a 77 year old man who presents with URI symptoms.  The symptoms began 1 week ago.  The patient complains of nasal congestion, purulent nasal discharge, sore throat, productive cough, earache, and sick contacts.  The patient denies fever, low-grade fever (<100.5 degrees), fever of 100.5-103 degrees, fever of 103.1-104 degrees, fever to >104 degrees, stiff neck, dyspnea, wheezing, rash, vomiting, diarrhea, use of an antipyretic, and response to antipyretic.  The patient denies itchy watery eyes, itchy throat, sneezing, seasonal symptoms, response to antihistamine, headache, muscle aches, and severe fatigue.  The patient denies the following risk factors for Strep sinusitis: unilateral facial pain, unilateral nasal discharge, poor response to decongestant, double sickening, tooth pain, Strep exposure, tender adenopathy, and absence of cough.    Current Medications (verified): 1)  Hytrin 1 Mg Caps (Terazosin Hcl) .Marland Kitchen.. 1 By Mouth Qhs 2)  Lisinopril-Hydrochlorothiazide 10-12.5 Mg Tabs (Lisinopril-Hydrochlorothiazide) .Marland Kitchen.. 1 By Mouth Once Daily 3)  Eq Aspirin 325 Mg Tabs (Aspirin) .... Take 1 Tab Once Daily 4)  Mvi-Centrum .Marland Kitchen.. 1 By Mouth Once Daily 5)  Vitamin D3 1000 Unit Tabs (Cholecalciferol) .Marland Kitchen.. 1 By Mouth Qam 6)  Crestor 20 Mg Tabs (Rosuvastatin Calcium) .Marland Kitchen.. 1 By Mouth Qpm 7)  Prednisone 20 Mg Tabs (Prednisone) .... By Mouth For 2 Weeks Then 15 Mg For 2 Weeks 8)  Zithromax Z-Pak 250 Mg Tabs (Azithromycin) .... As Directed 9)   Delsym 30 Mg/29ml Lqcr (Dextromethorphan Polistirex) 10)  Mucinex Dm 30-600 Mg Xr12h-Tab (Dextromethorphan-Guaifenesin)  Allergies (verified): No Known Drug Allergies  Past History:  Past medical, surgical, family and social histories (including risk factors) reviewed for relevance to current acute and chronic problems.  Past Medical History: Reviewed history from 03/13/2008 and no changes required. Hyperlipidemia Hypertension Osteoarthritis Prostate cancer, hx of--s/p radiation seed implants.  Past Surgical History: Reviewed history from 03/08/2010 and no changes required. Tonsillectomy Total knee replacement (12/31/2008) Total hip replacement (08/27/2008)  Family History: Reviewed history from 06/18/2007 and no changes required. Family History of CAD Male 1st degree relative 54yo---mother Family History of CAD Male 1st degree relative- 30--- father father-- pancreatic cancer brother-- pancreatic ca 28yo  Social History: Reviewed history from 06/18/2007 and no changes required. Retired--Marine,  [employer] Development worker, international aid) Married Never Smoked Alcohol use-yes Drug use-no Regular exercise-yes  Review of Systems      See HPI  Physical Exam  General:  Well-developed,well-nourished,in no acute distress; alert,appropriate and cooperative throughout examination Ears:  + b/l hearing aids Nose:  External nasal examination shows no deformity or inflammation. Nasal mucosa are pink and moist without lesions or exudates. Mouth:  pharyngeal erythema and postnasal drip.   Neck:  cervical lymphadenopathy.   Lungs:  + rhonci scattered Heart:  Normal rate and regular rhythm. S1 and S2 normal without gallop, murmur, click, rub or other extra sounds. Psych:  Cognition and judgment appear intact. Alert and cooperative with normal attention  span and concentration. No apparent delusions, illusions, hallucinations   Impression & Recommendations:  Problem # 1:  BRONCHITIS- ACUTE  (ICD-466.0)  His updated medication list for this problem includes:    Zithromax Z-pak 250 Mg Tabs (Azithromycin) .Marland Kitchen... As directed    Delsym 30 Mg/52ml Lqcr (Dextromethorphan polistirex)    Mucinex Dm 30-600 Mg Xr12h-tab (Dextromethorphan-guaifenesin)  Take antibiotics and other medications as directed. Encouraged to push clear liquids, get enough rest, and take acetaminophen as needed. To be seen in 5-7 days if no improvement, sooner if worse.  Complete Medication List: 1)  Hytrin 1 Mg Caps (Terazosin hcl) .Marland Kitchen.. 1 by mouth qhs 2)  Lisinopril-hydrochlorothiazide 10-12.5 Mg Tabs (Lisinopril-hydrochlorothiazide) .Marland Kitchen.. 1 by mouth once daily 3)  Eq Aspirin 325 Mg Tabs (Aspirin) .... Take 1 tab once daily 4)  Mvi-centrum  .Marland KitchenMarland Kitchen. 1 by mouth once daily 5)  Vitamin D3 1000 Unit Tabs (Cholecalciferol) .Marland Kitchen.. 1 by mouth qam 6)  Crestor 20 Mg Tabs (Rosuvastatin calcium) .Marland Kitchen.. 1 by mouth qpm 7)  Prednisone 20 Mg Tabs (Prednisone) .... By mouth for 2 weeks then 15 mg for 2 weeks 8)  Zithromax Z-pak 250 Mg Tabs (Azithromycin) .... As directed 9)  Delsym 30 Mg/5ml Lqcr (Dextromethorphan polistirex) 10)  Mucinex Dm 30-600 Mg Xr12h-tab (Dextromethorphan-guaifenesin) Prescriptions: ZITHROMAX Z-PAK 250 MG TABS (AZITHROMYCIN) as directed  #1 x 0   Entered and Authorized by:   Loreen Freud DO   Signed by:   Loreen Freud DO on 06/03/2010   Method used:   [facility] to        [facility] [phone]* (retail)       [address]       [place], [place]  [address]       Ph: [phone]       Fax: [phone]   RxID:   [id number]

## 2010-10-19 NOTE — Letter (Signed)
[facility]: [facility]   Imported By: Lanelle Bal 11/23/2009 13:07:57  _____________________________________________________________________  External Attachment:    Type:   Image     Comment:   External Document

## 2010-10-19 NOTE — Assessment & Plan Note (Signed)
Summary: guaiac Pos Stool--ch.    History of Present Illness Visit Type: Initial Consult Primary GI MD: Sheryn Bison MD FACP FAGA Primary Provider: Loreen Freud DO Requesting Provider: Loreen Freud DO Chief Complaint: Postiive hemoccult on exam,Pt went to ER for imacted bowel in July, states he had to have a digital disimpaction, feels like that is the cause for the positive hemoccult test History of Present Illness:   Very Pleasant 77 year old Caucasian male with recently diagnosed polymyalgia rheumatica and currently is on low-dose prednisone. He has a history of severe osteoarthritis with previous bilateral knee replacements and left hip. He has had intermittently guaiac positive stools with colonoscopy in 2005 showing adenomatous polyps which were removed, negative followup exam in 2008.  He recently has had worsening constipation related to tramadol use for arthritis. He actually had impaction requiring enemas and manual disimpaction and within the last month. He is followed by endocrinology for thyroid nodules. He continues to be constipated but has no abdominal or rectal pain. He has not had any rectal bleeding, upper gastrointestinal, or hepatobiliary complaints. He is on aspirin 325 mg a day for prophylaxis. He denies previous MI or CVA. He does have well controlled essential hypertension and hyperlipidemia.   GI Review of Systems      Denies abdominal pain, acid reflux, belching, bloating, chest pain, dysphagia with liquids, dysphagia with solids, heartburn, loss of appetite, nausea, vomiting, vomiting blood, weight loss, and  weight gain.      Reports constipation.     Denies anal fissure, black tarry stools, change in bowel habit, diarrhea, diverticulosis, fecal incontinence, heme positive stool, hemorrhoids, irritable bowel syndrome, jaundice, light color stool, liver problems, rectal bleeding, and  rectal pain.    Current Medications (verified): 1)  Hytrin 1 Mg Caps  (Terazosin Hcl) .Marland Kitchen.. 1 By Mouth Qhs 2)  Lisinopril-Hydrochlorothiazide 10-12.5 Mg Tabs (Lisinopril-Hydrochlorothiazide) .Marland Kitchen.. 1 By Mouth Once Daily 3)  Eq Aspirin 325 Mg Tabs (Aspirin) .... Take 1 Tab Once Daily 4)  Mvi-Centrum .Marland Kitchen.. 1 By Mouth Once Daily 5)  Vitamin D3 1000 Unit Tabs (Cholecalciferol) .Marland Kitchen.. 1 By Mouth Qam 6)  Crestor 20 Mg Tabs (Rosuvastatin Calcium) .Marland Kitchen.. 1 By Mouth Qpm 7)  Prednisone 20 Mg Tabs (Prednisone) .... By Mouth For 2 Weeks Then 15 Mg For 2 Weeks  Allergies (verified): No Known Drug Allergies  Past History:  Past medical, surgical, family and social histories (including risk factors) reviewed for relevance to current acute and chronic problems.  Past Medical History: Hyperlipidemia Hypertension Osteoarthritis Prostate cancer, hx of--s/p radiation seed implants. Colon polyps - Tubular Adenoma 2005 blood clots thyroid disease  Past Surgical History: Reviewed history from 03/08/2010 and no changes required. Tonsillectomy Total knee replacement (12/31/2008) Total hip replacement (08/27/2008)  Family History: Reviewed history from 06/18/2007 and no changes required. Family History of CAD Male 1st degree relative 54yo---mother Family History of CAD Male 1st degree relative- 27--- father father-- pancreatic cancer brother-- pancreatic ca 28yo  Social History: Reviewed history from 06/18/2007 and no changes required. 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  The patient denies allergy/sinus, anemia, anxiety-new, back pain, blood in urine, breast changes/lumps, change in vision, confusion, coughing up blood, depression-new, fainting, fatigue, fever, headaches-new, heart murmur, heart rhythm changes, itching, night sweats, nosebleeds, shortness of  breath, skin rash, sleeping problems, sore throat,  swollen lymph glands, thirst - excessive, urination - excessive, urination changes/pain, urine leakage, vision changes, and voice change.         He recently completed a course of Z-Pak for pneumonitis. He also has a history of radiation seed implants for prostate carcinoma. Review of his colonoscopies as not mentioned significant radiation proctitis.  Vital Signs:  Patient profile:   77 year old male Height:      73 inches Weight:      237 pounds BMI:     31.38 BSA:     2.31 Pulse rate:   78 / minute Pulse rhythm:   regular BP sitting:   130 / 74  (left arm)  Vitals Entered By: Merri Ray CMA Duncan Dull) (June 10, 2010 9:06 AM)  Physical Exam  General:  Well developed, well nourished, no acute distress.healthy appearing.   Head:  Normocephalic and atraumatic. Eyes:  PERRLA, no icterus.exam deferred to patient's ophthalmologist.   Neck:  Supple; no masses or thyromegaly. Lungs:  Clear throughout to auscultation. Heart:  Regular rate and rhythm; no murmurs, rubs,  or bruits. Abdomen:  Soft, nontender and nondistended. No masses, hepatosplenomegaly or hernias noted. Normal bowel sounds. Rectal:  Normal exam.hemocult negative.  Hard but not impacted stool present. No obvious fissure or fistula noted. No rectal masses or tenderness. Msk:  decreased ROM and arthritic changes.  lower extremities. Extremities:  No clubbing, cyanosis, edema or deformities noted. Neurologic:  Alert and  oriented x4;  grossly normal neurologically. Cervical Nodes:  No significant cervical adenopathy. Psych:  Alert and cooperative. Normal mood and affect.   Impression & Recommendations:  Problem # 1:  GUAIAC POSITIVE STOOL (ICD-578.1) Assessment Unchanged He denies GI complaints except for new onset constipation. He is on aspirin 325 mg a day and may have a false positive stool guaiac. Review of his labs shows a normal CBC. I will repeat his stool  cards for human hemoglobin will treat him for constipation with [facility] Colon Health tabs b.i.d. and q.h.s. MiraLax 8 ounces as tolerated. He may well need followup colonoscopy exam and endoscopy. Apparently he also was taking Mobic at the time of his previous stool card exams. He currently denies rectal bleeding or any symptoms suggestive of radiation proctitis.  Problem # 2:  BRONCHITIS- ACUTE (ICD-466.0) Assessment: Improved  Problem # 3:  PAIN IN JOINT, MULTIPLE SITES (ICD-719.49) Assessment: Improved recent diagnosis of polymyalgia rheumatica on low-dose prednisone therapy.He Also has severe osteoarthritis in his had previous knee and hip replacements.  Problem # 4:  THYROID NODULE, HX OF (ICD-V12.2) Assessment: Unchanged followed by endocrinology.  Problem # 5:  PROSTATE CANCER, HX OF (ICD-V10.46) Assessment: Unchanged  Patient Instructions: 1)  Copy sent to : Loreen Freud DO and Dr. Clarita Crane at [facility]. 2)  Your prescriptions has been sent to you pharmacy.  3)  Constipation and Hemorrhoids brochure given.  4)  Take Miralax at bedtime. 5)  Take the [facility] one tablet by mouth two times a day. 6)  Please go to the basement today for your labs.  7)  The medication list was reviewed and reconciled.  All changed / newly prescribed medications were explained.  A complete medication list was provided to the patient / caregiver. Prescriptions: MIRALAX  POWD (POLYETHYLENE GLYCOL 3350) mix one capfull in 8 ounces of water and drink at bedtime  #527 x 1   Entered by:   [name] CMA (AAMA)   Authorized by:   Mardella Layman  MD [facility]   Signed by:   Mardella Layman MD [facility] on 06/10/2010   Method used:   [facility]        [facility] [phone][facility] (retail)       [address]       [place], [place]  [address]       Ph: [phone]       Fax: [phone]   RxID:   [id number]

## 2010-10-19 NOTE — Assessment & Plan Note (Signed)
Summary: CONGESTED/SORE THROAT/KN   Vital Signs:  Patient profile:   77 year old male Weight:      248.6 pounds Temp:     97.3 degrees F oral Pulse rate:   83 / minute Pulse rhythm:   regular BP sitting:   130 / 70  (left arm) Cuff size:   large  Vitals Entered By: Almeta Monas CMA Duncan Dull) (August 26, 2010 2:49 PM) CC: x4days c/o cough, sore throat, runny nose and green sputum, Cough   History of Present Illness:  Cough      This is a 77 year old man who presents with Cough.  The symptoms began 4 days ago.  The patient reports productive cough, but denies non-productive cough, pleuritic chest pain, shortness of breath, wheezing, exertional dyspnea, fever, hemoptysis, and malaise.  Associated symtpoms include cold/URI symptoms, sore throat, and chronic rhinitis.  The cough is worse with activity.  Ineffective prior treatments have included OTC cough medication.    Current Medications (verified): 1)  Hytrin 1 Mg Caps (Terazosin Hcl) .Marland Kitchen.. 1 By Mouth Qhs 2)  Lisinopril-Hydrochlorothiazide 10-12.5 Mg Tabs (Lisinopril-Hydrochlorothiazide) .Marland Kitchen.. 1 By Mouth Once Daily 3)  Eq Aspirin 325 Mg Tabs (Aspirin) .... Take 1 Tab Once Daily 4)  Mvi-Centrum .Marland Kitchen.. 1 By Mouth Once Daily 5)  Vitamin D3 1000 Unit Tabs (Cholecalciferol) .Marland Kitchen.. 1 By Mouth Qam 6)  Crestor 20 Mg Tabs (Rosuvastatin Calcium) .Marland Kitchen.. 1 By Mouth Qpm**labs Due Now** 7)  Prednisone 20 Mg Tabs (Prednisone) .... By Mouth For 2 Weeks Then 15 Mg For 2 Weeks 8)  Miralax  Powd (Polyethylene Glycol 3350) .... Mix One Capfull in 8 Ounces of Water and Drink At Bedtime 9)  [employer]Ex  Caps (Probiotic Product) .... Take One By Mouth Two Times A Day 10)  Zithromax Z-Pak 250 Mg Tabs (Azithromycin) .... As Directed  Allergies (verified): No Known Drug Allergies  Past History:  Past Medical History: Last updated: 06/10/2010 Hyperlipidemia Hypertension Osteoarthritis Prostate cancer, hx of--s/p radiation seed implants. Colon  polyps - Tubular Adenoma 2005 blood clots thyroid disease  Past Surgical History: Last updated: 03/08/2010 Tonsillectomy Total knee replacement (12/31/2008) Total hip replacement (08/27/2008)  Family History: Last updated: 06/18/2007 Family History of CAD Male 1st degree relative 54yo---mother Family History of CAD Male 1st degree relative- 81--- father father-- pancreatic cancer brother-- pancreatic ca 54yo  Social History: Last updated: 06/18/2007 Tora Duck,  [employer] worker, international aid) Married Never Smoked Alcohol use-yes Drug use-no Regular exercise-yes  Risk Factors: Alcohol Use: <1 (04/12/2010) Caffeine Use: 0 (04/12/2010) Exercise: yes (04/12/2010)  Risk Factors: Smoking Status: never (04/12/2010) Passive Smoke Exposure: no (04/12/2010)  Family History: Reviewed history from 06/18/2007 and no changes required. Family History of CAD Male 1st degree relative 54yo---mother Family History of CAD Male 1st degree relative- 52--- father father-- pancreatic cancer brother-- pancreatic ca 69yo  Social History: Reviewed history from 06/18/2007 and no changes required. 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  Otoscopic examination reveals clear canals, tympanic membranes are intact bilaterally without bulging, retraction, inflammation or discharge. Hearing is grossly normal bilaterally. Nose:  no external deformity, mucosal erythema, and mucosal edema.   Mouth:  pharyngeal erythema and postnasal drip.   Neck:  No deformities, masses, or tenderness noted. Lungs:  scattered rhonci occassional ext wheeze Psych:  Cognition and judgment appear intact. Alert and cooperative  with normal attention span and concentration. No apparent delusions, illusions, hallucinations   Impression & Recommendations:  Problem # 1:  BRONCHITIS- ACUTE (ICD-466.0)  Take antibiotics and other medications as directed. Encouraged to push clear liquids, get enough rest, and take acetaminophen as needed. To be seen in 5-7 days if no improvement, sooner if worse.  His updated medication list for this problem includes:    Zithromax Z-pak 250 Mg Tabs (Azithromycin) .Marland Kitchen... As directed  Complete Medication List: 1)  Hytrin 1 Mg Caps (Terazosin hcl) .Marland Kitchen.. 1 by mouth qhs 2)  Lisinopril-hydrochlorothiazide 10-12.5 Mg Tabs (Lisinopril-hydrochlorothiazide) .Marland Kitchen.. 1 by mouth once daily 3)  Eq Aspirin 325 Mg Tabs (Aspirin) .... Take 1 tab once daily 4)  Mvi-centrum  .Marland KitchenMarland Kitchen. 1 by mouth once daily 5)  Vitamin D3 1000 Unit Tabs (Cholecalciferol) .Marland Kitchen.. 1 by mouth qam 6)  Crestor 20 Mg Tabs (Rosuvastatin calcium) .Marland Kitchen.. 1 by mouth qpm**labs due now** 7)  Prednisone 20 Mg Tabs (Prednisone) .... By mouth for 2 weeks then 15 mg for 2 weeks 8)  Miralax Powd (Polyethylene glycol 3350) .... Mix one capfull in 8 ounces of water and drink at bedtime 9)  [facility] Colon Health Caps (Probiotic product) .... Take one by mouth two times a day 10)  Zithromax Z-pak 250 Mg Tabs (Azithromycin) .... As directed  Patient Instructions: 1)  Get plenty of rest, drink lots of clear liquids, and use Tylenol or Ibuprofen for fever and comfort. Return in 7-10 days if you're not better: sooner if you'er feeling worse.  2)  Use mucinex or mucinex DM for cough 3)  take claritin or allegra etc for drainage Prescriptions: ZITHROMAX Z-PAK 250 MG TABS (AZITHROMYCIN) as directed  #1 x 0   Entered and Authorized by:   Loreen Freud DO   Signed by:   Loreen Freud DO on 08/26/2010   Method used:   Electronically to        [facility] [phone]* (retail)       [address]       [place], [place]  [address]        Ph: [phone]       Fax: [phone]   RxID:   [id number]    Orders Added: 1)  Est. Patient Level III [60109]

## 2010-10-19 NOTE — Letter (Signed)
Summary: Primary Care Appointment Letter  [place] [facility] [place]  [address] [place], [place] [address]   Phone: [phone]  Fax: [phone]    07/29/2010 MRN: [id number]  Andrew Ramos [address] [place], [place]  [address]  Dear [name],   Your Primary Care Physician Loreen Freud DO has indicated that:    _______it is time to schedule an appointment.    _______you missed your appointment on______ and need to call and          reschedule.    __x_____you need to have lab work done.    _______you need to schedule an appointment discuss lab or test results.    _______you need to call to reschedule your appointment that is                       scheduled on _________.     Please call our office as soon as possible. Our phone number is [phone]. Please press option 1. Our office is open 8a-12noon and 1p-5p, Monday through Friday.     Thank you,    [place] [facility] Scheduler

## 2010-10-19 NOTE — Progress Notes (Signed)
Summary: Lab Results   Phone Note Outgoing Call   Call placed by: Army Fossa CMA,  March 09, 2010 3:42 PM Reason for Call: Discuss lab or test results Summary of Call: Regarding lab results, LMTCB:  ESR  ( sed rate)  slightly elevated----- recheck at pt convenience to make sure not increasing R A ---only very slightly elevated-- nonspecific--- nothing to worry about  Pt is slightly anemic---take mvi with iron daily How are his symptoms? Signed by Loreen Freud DO on 03/09/2010 at 1:30 PM  Follow-up for Phone Call        Pt states he just picked up the meds today so things are about the same. Pt is coming tomorrow to repeat sed rate. Army Fossa CMA  March 09, 2010 3:47 PM

## 2010-10-19 NOTE — Letter (Signed)
Summary: New Patient letter  [facility]  [address] [place], [place] [address]   Phone: [phone]  Fax: [phone]       04/28/2010 MRN: [id number]  Andrew Ramos [address] [place], [place]  [address]  Dear Andrew Ramos,  Welcome to the Gastroenterology Division at [facility].    You are scheduled to see Dr.  Jarold Motto on 06-10-10 at 9:00a.m. on the 3rd floor at [facility], [address]  We ask that you try to arrive at our office 15 minutes prior to your appointment time to allow for check-in.  We would like you to complete the enclosed self-administered evaluation form prior to your visit and bring it with you on the day of your appointment.  We will review it with you.  Also, please bring a complete list of all your medications or, if you prefer, bring the medication bottles and we will list them.  Please bring your insurance card so that we may make a copy of it.  If your insurance requires a referral to see a specialist, please bring your referral form from your primary care physician.  Co-payments are due at the time of your visit and may be paid by cash, check or credit card.     Your office visit will consist of a consult with your physician (includes a physical exam), any laboratory testing he/she may order, scheduling of any necessary diagnostic testing (e.g. x-ray, ultrasound, CT-scan), and scheduling of a procedure (e.g. Endoscopy, Colonoscopy) if required.  Please allow enough time on your schedule to allow for any/all of these possibilities.    If you cannot keep your appointment, please call [phone] to cancel or reschedule prior to your appointment date.  This allows [place] the opportunity to schedule an appointment for another patient in need of care.  If you do not cancel or reschedule by 5 p.m. the business day prior to your appointment date, you will be charged a $50.00 late cancellation/no-show fee.    Thank you for  choosing [facility] for your medical needs.  We appreciate the opportunity to care for you.  Please visit [place] at our website  to learn more about our practice.                     Sincerely,                                                             The Gastroenterology Division

## 2010-10-19 NOTE — Miscellaneous (Signed)
Summary: PT Initial Summary/[facility]  PT Initial [facility]   Imported By: Lanelle Bal 05/04/2010 08:00:45  _____________________________________________________________________  External Attachment:    Type:   Image     Comment:   External Document

## 2010-10-19 NOTE — Letter (Signed)
Summary: [facility]: Immunoassay Fecal Occult Blood (iFOB) Order Form  [place] [facility] [place]  [address] [place], [place] [address]   Phone: [phone]  Fax: [phone]      [facility]: Immunoassay Fecal Occult Blood (iFOB) Order Form   April 27, 2010 MRN: [id number]   DERIC BOCOCK 1931/09/24   Physicican Name:___dr_lowne_____________________  Diagnosis Code:_______v76.51___________________      Jeremy Johann CMA

## 2010-10-19 NOTE — Assessment & Plan Note (Signed)
Summary: follow up med/cbs   Vital Signs:  Patient profile:   77 year old male Height:      72.25 inches (183.52 cm) Weight:      239.38 pounds (108.81 kg) BMI:     32.36 O2 Sat:      96 % on Room air Temp:     98.9 degrees F (37.17 degrees C) oral Pulse rate:   68 / minute BP sitting:   100 / 50  (left arm) Cuff size:   large  Vitals Entered By: [name] (March 23, 2010 3:38 PM)  O2 Flow:  Room air CC: Follow up/discuss meds./kb Is Patient Diabetic? No Pain Assessment Patient in pain? no      Comments Patient notes that he has all listed side effects from Tramadol: sleepiness, dizziness, weight loss, and decreased appetite. Patient also questions if Niaspan has been causing his muscle issues./kb   History of Present Illness: Pt here go over labs and still c/o myalgias.  He has been off crestor for 2 weeks.  Pt also c/o fatigue and he lost 13 lbs in 2 weeks.   Current Medications (verified): 1)  Hytrin 1 Mg Caps (Terazosin Hcl) .Marland Kitchen.. 1 By Mouth Qhs 2)  Niaspan 1000 Mg Tbcr (Niacin (Antihyperlipidemic)) .Marland Kitchen.. 1 Once Daily 3)  Crestor 20 Mg Tabs (Rosuvastatin Calcium) .Marland Kitchen.. 1 Qsh 4)  Lisinopril-Hydrochlorothiazide 10-12.5 Mg Tabs (Lisinopril-Hydrochlorothiazide) .Marland Kitchen.. 1 By Mouth Once Daily 5)  Eq Aspirin 325 Mg Tabs (Aspirin) .... Take 1 Tab Once Daily 6)  Mvi-Centrum .Marland Kitchen.. 1 By Mouth Once Daily 7)  Ultram 50 Mg Tabs (Tramadol Hcl) .Marland Kitchen.. 1 -2 By Mouth Every 6 Hours Hours As Needed Pain 8)  Vitamin D3 1000 Unit Tabs (Cholecalciferol) .Marland Kitchen.. 1 By Mouth Qam  Allergies (verified): No Known Drug Allergies  Past History:  Past medical, surgical, family and social histories (including risk factors) reviewed for relevance to current acute and chronic problems.  Past Medical History: Reviewed history from 03/13/2008 and no changes required. Hyperlipidemia Hypertension Osteoarthritis Prostate cancer, hx of--s/p radiation seed implants.  Past Surgical History: Reviewed history  from 03/08/2010 and no changes required. Tonsillectomy Total knee replacement (12/31/2008) Total hip replacement (08/27/2008)  Family History: Reviewed history from 06/18/2007 and no changes required. Family History of CAD Male 1st degree relative 54yo---mother Family History of CAD Male 1st degree relative- 43--- father father-- pancreatic cancer brother-- pancreatic ca 18yo  Social History: Reviewed history from 06/18/2007 and no changes required. 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     Impression & Recommendations:  Problem # 1:  MYALGIA (ICD-729.1) con't off crestor ---stop niaspan rto 2-3 weeks His updated medication list for this problem includes:    Eq Aspirin 325 Mg Tabs (Aspirin) .Marland Kitchen... Take 1 tab once daily    Ultram 50 Mg Tabs (Tramadol hcl) .Marland Kitchen... 1 -2 by mouth every 6 hours hours as needed pain  Problem # 2:  HYPERTENSION (ICD-401.9)  low today---take 1/2 lisinopril--  pt just filled it  His updated medication list for this problem includes:    Hytrin 1 Mg Caps (Terazosin hcl) .Marland Kitchen... 1 by mouth qhs    Prinzide 10-12.5 Mg Tabs (Lisinopril-hydrochlorothiazide) .Marland Kitchen... 1/2 tab by mouth once  daily  BP today: 100/50 Prior BP: 128/76 (03/08/2010)  Labs Reviewed: K+: 4.5 (03/08/2010) Creat: : 1.0 (03/08/2010)   Chol: 102 (05/14/2009)   HDL: 46.30 (05/14/2009)   LDL: 46 (05/14/2009)   TG: 47.0 (05/14/2009)  Complete Medication List: 1)  Hytrin 1 Mg Caps (Terazosin hcl) .Marland Kitchen.. 1 by mouth qhs 2)  Niaspan 1000 Mg Tbcr (Niacin (antihyperlipidemic)) .Marland Kitchen.. 1 once daily 3)  Crestor 20 Mg Tabs (Rosuvastatin calcium) .Marland Kitchen.. 1 by  mouth at bedtime 4)  Prinzide 10-12.5 Mg Tabs (Lisinopril-hydrochlorothiazide) .... 1/2 tab by mouth once daily 5)  Eq Aspirin 325 Mg Tabs (Aspirin) .... Take 1 tab once daily 6)  Mvi-centrum  .Marland KitchenMarland Kitchen. 1 by mouth once daily 7)  Ultram 50 Mg Tabs (Tramadol hcl) .Marland Kitchen.. 1 -2 by mouth every 6 hours hours as needed pain 8)  Vitamin D3 1000 Unit Tabs (Cholecalciferol) .Marland Kitchen.. 1 by mouth qam  Patient Instructions: 1)  Please schedule a follow-up appointment in 2 weeks.  Prescriptions: LISINOPRIL 10 MG TABS (LISINOPRIL) 1 by mouth once daily  #30 x 2   Entered and Authorized by:   Loreen Freud DO   Signed by:   Loreen Freud DO on 03/23/2010   Method used:   [facility]ically to        [facility] [phone]* (retail)       [address]       [place], [place]  [address]       Ph: [phone]       Fax: [phone]   RxID:   [id number]

## 2010-10-19 NOTE — Assessment & Plan Note (Signed)
Summary: YEARLY EXAM AND FASTING LABS///SPH   Vital Signs:  Patient profile:   77 year old male Height:      73 inches Weight:      242 pounds Temp:     98.2 degrees F oral Pulse rate:   66 / minute BP sitting:   140 / 76  (left arm)  Vitals Entered By: Jeremy Johann CMA (April 12, 2010 9:16 AM) CC: Andrew Ramos  Does patient need assistance? Functional Status Self care, Cook/clean, Shopping, Social activities Ambulation Normal Comments pt is able to do all adls and can read and write.  Vision Screening:      Vision Comments: Pt sees optho q1y-- corrected to 20/20 with glasses 40db HL: Left  Right  Audiometry Comment: Pt has hearing aids b/l      History of Present Illness: Pt here for cpe and f/u.    Hyperlipidemia follow-up      This is a 77 year old man who presents for Hyperlipidemia follow-up.  The patient complains of muscle aches, but denies GI upset, abdominal pain, flushing, itching, constipation, diarrhea, and fatigue.  The patient denies the following symptoms: chest pain/pressure, exercise intolerance, dypsnea, palpitations, syncope, and pedal edema.  Compliance with medications (by patient report) has been near 100%.  Dietary compliance has been good.  Adjunctive measures currently used by the patient include ASA, limiting alcohol consumpton, and weight reduction.    Hypertension follow-up      The patient also presents for Hypertension follow-up.  The patient denies lightheadedness, urinary frequency, headaches, edema, impotence, rash, and fatigue.  The patient denies the following associated symptoms: chest pain, chest pressure, exercise intolerance, dyspnea, palpitations, syncope, leg edema, and pedal edema.  Compliance with medications (by patient report) has been near 100%.  The patient reports that dietary compliance has been good.  The patient reports no exercise.  Adjunctive measures currently used by the patient include salt restriction.    Preventive  Screening-Counseling & Management  Alcohol-Tobacco     Alcohol drinks/day: <1     Alcohol type: OCASSIONALLY     Smoking Status: never     Passive Smoke Exposure: no  Caffeine-Diet-Exercise     Caffeine use/day: 0     Does Patient Exercise: yes     Type of exercise: WALKING     Times/week: 7  Hep-HIV-STD-Contraception     HIV Risk: no     Dental Visit-last 6 months yes     Dental Care Counseling: not indicated; dental care within six months  Safety-Violence-Falls     Seat Belt Use: 100     Firearms in the Home: no firearms in the home     Smoke Detectors: yes     Violence in the Home: no risk noted     Fall Risk: no      Sexual History:  currently monogamous.    Current Medications (verified): 1)  Hytrin 1 Mg Caps (Terazosin Hcl) .Marland Kitchen.. 1 By Mouth Qhs 2)  Lisinopril 10 Mg Tabs (Lisinopril) .... Take 1 Tab Once Daily 3)  Eq Aspirin 325 Mg Tabs (Aspirin) .... Take 1 Tab Once Daily 4)  Mvi-Centrum .Marland Kitchen.. 1 By Mouth Once Daily 5)  Vitamin D3 1000 Unit Tabs (Cholecalciferol) .Marland Kitchen.. 1 By Mouth Qam 6)  Crestor 20 Mg Tabs (Rosuvastatin Calcium) .Marland Kitchen.. 1 By Mouth Qpm 7)  Mobic 15 Mg Tabs (Meloxicam) .... 1/2 -1 By Mouth Once Daily 8)  Prilosec Otc 20 Mg Tbec (Omeprazole Magnesium) .... Once Daily  Allergies (verified): No Known Drug Allergies  Past History:  Past Medical History: Last updated: 03/13/2008 Hyperlipidemia Hypertension Osteoarthritis Prostate cancer, hx of--s/p radiation seed implants.  Past Surgical History: Last updated: 03/08/2010 Tonsillectomy Total knee replacement (12/31/2008) Total hip replacement (08/27/2008)  Family History: Last updated: 06/18/2007 Family History of CAD Male 1st degree relative 54yo---mother Family History of CAD Male 1st degree relative- 39--- father father-- pancreatic cancer brother-- pancreatic ca 54yo  Social History: Last updated: 06/18/2007 Tora Duck,  [employer] worker, international aid) Married Never Smoked Alcohol use-yes Drug  use-no Regular exercise-yes  Risk Factors: Alcohol Use: <1 (04/12/2010) Caffeine Use: 0 (04/12/2010) Exercise: yes (04/12/2010)  Risk Factors: Smoking Status: never (04/12/2010) Passive Smoke Exposure: no (04/12/2010)  Family History: Reviewed history from 06/18/2007 and no changes required. Family History of CAD Male 1st degree relative 54yo---mother Family History of CAD Male 1st degree relative- 15--- father father-- pancreatic cancer brother-- pancreatic ca 66yo  Social History: Reviewed history from 06/18/2007 and no changes required. 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 Eyes:  Denies blurring, discharge, double vision, eye irritation, eye pain, halos, itching, light sensitivity, red eye, vision loss-1 eye, and vision loss-both eyes. ENT:  Denies decreased hearing, difficulty swallowing, ear discharge, earache, hoarseness, nasal congestion, nosebleeds, postnasal drainage, ringing in ears, sinus pressure, and sore throat. CV:  Denies bluish discoloration of lips or nails, chest pain or discomfort, difficulty breathing at night, difficulty breathing while lying down, fainting, fatigue, leg cramps with exertion, lightheadness, near fainting, palpitations, shortness of breath with exertion, swelling of feet, swelling of hands, and weight gain. Resp:  Denies chest discomfort, chest pain with inspiration, cough, coughing up blood, excessive snoring, hypersomnolence, morning headaches, pleuritic, shortness of breath, sputum productive, and wheezing. GI:  Denies abdominal pain, bloody stools, change in bowel habits, constipation, dark tarry stools, diarrhea, excessive appetite, gas,  hemorrhoids, indigestion, and loss of appetite. GU:  Denies decreased libido, discharge, dysuria, erectile dysfunction, genital sores, hematuria, incontinence, nocturia, urinary frequency, and urinary hesitancy. MS:  Complains of joint pain and stiffness; denies joint redness, joint swelling, loss of strength, low back pain, mid back pain, muscle aches, muscle , cramps, muscle weakness, and thoracic pain. Derm:  Denies changes in color of skin, changes in nail beds, dryness, excessive perspiration, flushing, hair loss, insect bite(s), itching, lesion(s), poor wound healing, and rash; Derm -- Dr Emily Filbert. Neuro:  Denies brief paralysis, difficulty with concentration, disturbances in coordination, falling down, headaches, inability to speak, memory loss, numbness, poor balance, seizures, sensation of room spinning, tingling, tremors, visual disturbances, and weakness. Psych:  Denies alternate hallucination ( auditory/visual), anxiety, depression, easily angered, easily tearful, irritability, mental problems, panic attacks, sense of great danger, suicidal thoughts/plans, thoughts of violence, unusual visions or sounds, and thoughts /plans of harming others. Endo:  Denies cold intolerance, excessive hunger, excessive thirst, excessive urination, heat intolerance, polyuria, and weight change. Heme:  Denies abnormal bruising, bleeding, enlarge lymph nodes, fevers, pallor, and skin discoloration. Allergy:  Denies hives or rash, itching eyes, persistent infections, seasonal allergies, and sneezing.  Physical Exam  General:  Well-developed,well-nourished,in no acute distress; alert,appropriate and cooperative throughout examination Head:  Normocephalic and atraumatic without obvious abnormalities. No apparent alopecia or balding. Eyes:  pupils equal,  pupils round, pupils reactive to light, pupils react to accomodation, corneas and lenses clear, and no injection.   Ears:  R ear normal and L ear normal.   +  hearing aids b/l  Nose:  External nasal examination shows no deformity or inflammation. Nasal mucosa are pink and moist without lesions or exudates. Mouth:  Oral mucosa and oropharynx without lesions or exudates.  Teeth in good repair. Neck:  No deformities, masses, or tenderness noted.no carotid bruits.   Chest Wall:  No deformities, masses, tenderness or gynecomastia noted. Lungs:  Normal respiratory effort, chest expands symmetrically. Lungs are clear to auscultation, no crackles or wheezes. Heart:  normal rate and no murmur.   Abdomen:  Bowel sounds positive,abdomen soft and non-tender without masses, organomegaly or hernias noted. Rectal:  per urology Genitalia:  per urology Prostate:  urologly q80m  Msk:  decreased rom R shoulder no joint swelling, no joint warmth, and no redness over joints.   Extremities:  No clubbing, cyanosis, edema, or deformity noted with normal full range of motion of all joints.   Neurologic:  alert & oriented X3 and gait normal.   Skin:  Intact without suspicious lesions or rashes Cervical Nodes:  No lymphadenopathy noted Psych:  Cognition and judgment appear intact. Alert and cooperative with normal attention span and concentration. No apparent delusions, illusions, hallucinations   Impression & Recommendations:  Problem # 1:  PREVENTIVE HEALTH CARE (ICD-V70.0)  Orders: Venipuncture ([address]) TLB-Lipid Panel (80061-LIPID) TLB-BMP (Basic Metabolic Panel-BMET) (80048-METABOL) TLB-CBC Platelet - w/Differential (85025-CBCD) TLB-Hepatic/Liver Function Pnl (80076-HEPATIC) TLB-TSH (Thyroid Stimulating Hormone) (84443-TSH) TLB-Sedimentation Rate (ESR) (85652-ESR) T-Vitamin D (25-Hydroxy) (60454-09811) Specimen Handling (91478) First annual wellness visit with prevention plan  (G9562) EKG w/ Interpretation (93000)  Reviewed preventive care protocols, scheduled due services, and updated immunizations.  Problem # 2:  SHOULDER PAIN, BILATERAL  (ICD-719.41)  His updated medication list for this problem includes:    Eq Aspirin 325 Mg Tabs (Aspirin) .Marland Kitchen... Take 1 tab once daily    Mobic 15 Mg Tabs (Meloxicam) .Marland Kitchen... 1/2 -1 by mouth once daily  Orders: Physical Therapy Referral (PT)  Discussed shoulder exercises, use of moist heat or ice, and medication.   Problem # 3:  MYALGIA (ICD-729.1)  His updated medication list for this problem includes:    Eq Aspirin 325 Mg Tabs (Aspirin) .Marland Kitchen... Take 1 tab once daily    Mobic 15 Mg Tabs (Meloxicam) .Marland Kitchen... 1/2 -1 by mouth once daily  Orders: Venipuncture (13086) TLB-Lipid Panel (80061-LIPID) TLB-BMP (Basic Metabolic Panel-BMET) (80048-METABOL) TLB-CBC Platelet - w/Differential (85025-CBCD) TLB-Hepatic/Liver Function Pnl (80076-HEPATIC) TLB-TSH (Thyroid Stimulating Hormone) (84443-TSH) TLB-Sedimentation Rate (ESR) (85652-ESR) T-Vitamin D (25-Hydroxy) (57846-96295)  Problem # 4:  THYROID NODULE, HX OF (ICD-V12.2)  Orders: Venipuncture (28413) TLB-Lipid Panel (80061-LIPID) TLB-BMP (Basic Metabolic Panel-BMET) (80048-METABOL) TLB-CBC Platelet - w/Differential (85025-CBCD) TLB-Hepatic/Liver Function Pnl (80076-HEPATIC) TLB-TSH (Thyroid Stimulating Hormone) (84443-TSH) TLB-Sedimentation Rate (ESR) (85652-ESR) T-Vitamin D (25-Hydroxy) (24401-02725)  Problem # 5:  HYPERTENSION (ICD-401.9)  His updated medication list for this problem includes:    Hytrin 1 Mg Caps (Terazosin hcl) .Marland Kitchen... 1 by mouth qhs    Lisinopril 10 Mg Tabs (Lisinopril) .Marland Kitchen... Take 1 tab once daily  Orders: Venipuncture (36644) TLB-Lipid Panel (80061-LIPID) TLB-BMP (Basic Metabolic Panel-BMET) (80048-METABOL) TLB-CBC Platelet - w/Differential (85025-CBCD) TLB-Hepatic/Liver Function Pnl (80076-HEPATIC) TLB-TSH (Thyroid Stimulating Hormone) (84443-TSH) TLB-Sedimentation Rate (ESR) (85652-ESR) T-Vitamin D (25-Hydroxy) (03474-25956)  BP today: 140/76 Prior BP: 130/78 (04/06/2010)  Labs Reviewed: K+: 4.5  (03/08/2010) Creat: : 1.0 (03/08/2010)   Chol: 102 (05/14/2009)  HDL: 46.30 (05/14/2009)   LDL: 46 (05/14/2009)   TG: 47.0 (05/14/2009)  Problem # 6:  HYPERLIPIDEMIA (ICD-272.4)  His updated medication list for this problem includes:    Crestor 20 Mg Tabs (Rosuvastatin calcium) .Marland Kitchen... 1 by mouth qpm  Orders: Venipuncture ([address]) TLB-Lipid Panel (80061-LIPID) TLB-BMP (Basic Metabolic Panel-BMET) (80048-METABOL) TLB-CBC Platelet - w/Differential (85025-CBCD) TLB-Hepatic/Liver Function Pnl (80076-HEPATIC) TLB-TSH (Thyroid Stimulating Hormone) (84443-TSH) TLB-Sedimentation Rate (ESR) (85652-ESR) T-Vitamin D (25-Hydroxy) (60454-09811)  Labs Reviewed: SGOT: 32 (03/08/2010)   SGPT: 30 (03/08/2010)   HDL:46.30 (05/14/2009), 45.5 (10/31/2008)  LDL:46 (05/14/2009), 49 (10/31/2008)  Chol:102 (05/14/2009), 110 (10/31/2008)  Trig:47.0 (05/14/2009), 76 (10/31/2008)  Complete Medication List: 1)  Hytrin 1 Mg Caps (Terazosin hcl) .Marland Kitchen.. 1 by mouth qhs 2)  Lisinopril 10 Mg Tabs (Lisinopril) .... Take 1 tab once daily 3)  Eq Aspirin 325 Mg Tabs (Aspirin) .... Take 1 tab once daily 4)  Mvi-centrum  .Marland KitchenMarland Kitchen. 1 by mouth once daily 5)  Vitamin D3 1000 Unit Tabs (Cholecalciferol) .Marland Kitchen.. 1 by mouth qam 6)  Crestor 20 Mg Tabs (Rosuvastatin calcium) .Marland Kitchen.. 1 by mouth qpm 7)  Mobic 15 Mg Tabs (Meloxicam) .... 1/2 -1 by mouth once daily 8)  Prilosec Otc 20 Mg Tbec (Omeprazole magnesium) .... Once daily   EKG  Procedure date:  04/12/2010  Findings:      Sinus bradycardia with rate of:  52  Appended Document: YEARLY EXAM AND FASTING LABS///SPH  Laboratory Results   Urine Tests   Date/Time Reported: April 12, 2010 1:47 PM   Routine Urinalysis   Color: yellow Appearance: Clear Glucose: negative   (Normal Range: Negative) Bilirubin: negative   (Normal Range: Negative) Ketone: negative   (Normal Range: Negative) Spec. Gravity: 1.020   (Normal Range: 1.003-1.035) Blood: negative   (Normal Range:  Negative) pH: 5.0   (Normal Range: 5.0-8.0) Protein: negative   (Normal Range: Negative) Urobilinogen: negative   (Normal Range: 0-1) Nitrite: negative   (Normal Range: Negative) Leukocyte Esterace: negative   (Normal Range: Negative)    Comments: Floydene Flock  April 12, 2010 1:47 PM

## 2010-10-19 NOTE — Consult Note (Signed)
Summary: [facility]  [facility]   Imported By: Lanelle Bal 05/18/2010 08:11:17  _____________________________________________________________________  External Attachment:    Type:   Image     Comment:   External Document

## 2010-10-19 NOTE — Letter (Signed)
Summary: [facility]   Imported By: Lanelle Bal 06/14/2010 10:56:22  _____________________________________________________________________  External Attachment:    Type:   Image     Comment:   External Document

## 2010-10-19 NOTE — Progress Notes (Signed)
Summary: Prednisone and flu shot  Phone Note Call from Patient Call back at Home Phone [phone]   Summary of Call: Patient left message that he is currently seein a rhemotologist and is taking Prednisone. He would like to know if he can go ahead with flu shot or if he should wait until off Prednisone. Please advise. Initial call taken by: [name] CMA,  July 02, 2010 3:20 PM  Follow-up for Phone Call        it would be better to wait until he is off prednisone Follow-up by: Loreen Freud DO,  July 02, 2010 3:24 PM  Additional Follow-up for Phone Call Additional follow up Details #1::        Pts wife is aware. Army Fossa CMA  July 02, 2010 4:43 PM

## 2010-10-19 NOTE — Assessment & Plan Note (Signed)
Summary: joint pain/leg cramps/cbs   Vital Signs:  Patient profile:   77 year old male Weight:      252 pounds Pulse rate:   65 / minute Pulse rhythm:   regular BP sitting:   128 / 76  (left arm) Cuff size:   large  Vitals Entered By: Army Fossa CMA (March 08, 2010 4:03 PM) CC: Pt here to discuss achey joints and muscles.   History of Present Illness: Pt here c/o muscle aches  all over for several weeks.  It was just L hip and r shoulder and pain went away with prednisone from ortho but now pain is all over.    Current Medications (verified): 1)  Hytrin 1 Mg Caps (Terazosin Hcl) .Marland Kitchen.. 1 By Mouth Qhs 2)  Niaspan 1000 Mg Tbcr (Niacin (Antihyperlipidemic)) .Marland Kitchen.. 1 Once Daily 3)  Crestor 20 Mg Tabs (Rosuvastatin Calcium) .Marland Kitchen.. 1 Qsh 4)  Lisinopril-Hydrochlorothiazide 10-12.5 Mg Tabs (Lisinopril-Hydrochlorothiazide) .Marland Kitchen.. 1 By Mouth Once Daily 5)  Eq Aspirin 325 Mg Tabs (Aspirin) .... Take 1 Tab Once Daily 6)  Mvi-Centrum .Marland Kitchen.. 1 By Mouth Once Daily 7)  Ultram 50 Mg Tabs (Tramadol Hcl) .Marland Kitchen.. 1 -2 By Mouth Every 6 Hours Hours As Needed Pain  Allergies (verified): No Known Drug Allergies  Past History:  Past medical, surgical, family and social histories (including risk factors) reviewed for relevance to current acute and chronic problems.  Past Medical History: Reviewed history from 03/13/2008 and no changes required. Hyperlipidemia Hypertension Osteoarthritis Prostate cancer, hx of--s/p radiation seed implants.  Past Surgical History: Tonsillectomy Total knee replacement (12/31/2008) Total hip replacement (08/27/2008)  Family History: Reviewed history from 06/18/2007 and no changes required. Family History of CAD Male 1st degree relative 54yo---mother Family History of CAD Male 1st degree relative- 42--- father father-- pancreatic cancer brother-- pancreatic ca 78yo  Social History: Reviewed history from 06/18/2007 and no changes required. 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   Extremities:  No clubbing, cyanosis, edema, or deformity noted with normal full range of motion of all joints.   Neurologic:  gait normal and DTRs symmetrical and normal.   Psych:  Oriented X3 and normally interactive.     Impression & Recommendations:  Problem # 1:  MYALGIA (ICD-729.1)  His updated medication list for this problem includes:    Eq Aspirin 325 Mg Tabs (Aspirin) .Marland Kitchen... Take 1 tab once daily    Ultram 50 Mg Tabs (Tramadol hcl) .Marland Kitchen... 1 -2 by mouth every 6 hours hours as needed pain  Orders: Venipuncture ([address]) TLB-BMP (Basic Metabolic Panel-BMET) (80048-METABOL) TLB-CBC Platelet - w/Differential (85025-CBCD) TLB-Hepatic/Liver Function Pnl (80076-HEPATIC) TLB-CK Total Only(Creatine Kinase/CPK) (82550-CK) TLB-Rheumatoid Factor (RA) (60454-UJ) TLB-Sedimentation Rate (ESR) (85652-ESR) T-Antinuclear Antib (ANA) (81191-47829) T-Vitamin D (25-Hydroxy) (56213-08657)  Complete Medication List: 1)  Hytrin 1 Mg Caps (Terazosin hcl) .Marland Kitchen.. 1 by mouth qhs 2)  Niaspan 1000 Mg Tbcr (Niacin (antihyperlipidemic)) .Marland Kitchen.. 1 once daily 3)  Crestor 20 Mg Tabs (Rosuvastatin calcium) .Marland Kitchen.. 1 qsh 4)  Lisinopril-hydrochlorothiazide 10-12.5 Mg Tabs (Lisinopril-hydrochlorothiazide) .Marland Kitchen.. 1 by mouth once daily 5)  Eq Aspirin 325 Mg Tabs (Aspirin) .... Take 1 tab once daily 6)  Mvi-centrum  .Marland KitchenMarland Kitchen. 1 by  mouth once daily 7)  Ultram 50 Mg Tabs (Tramadol hcl) .Marland Kitchen.. 1 -2 by mouth every 6 hours hours as needed pain  Patient Instructions: 1)  stop crestor for 2 weeks-- call us if pain gets better   Prescriptions: LISINOPRIL-HYDROCHLOROTHIAZIDE 10-12.5 MG TABS (LISINOPRIL-HYDROCHLOROTHIAZIDE) 1 by mouth once daily  #90 x 3   Entered and Authorized by:   Loreen Freud DO   Signed by:   Loreen Freud DO on 03/08/2010   Method used:   Faxed to ...       [facility] education [facility])       [address] [address]       [place], [place]  [address]       Ph: [phone]       Fax: [phone]   RxID:   [id number] NIASPAN 1000 MG TBCR (NIACIN (ANTIHYPERLIPIDEMIC)) 1 once daily  #90 x 3   Entered and Authorized by:   Loreen Freud DO   Signed by:   Loreen Freud DO on 03/08/2010   Method used:   Faxed to ...       [facility] education [facility])       [address] [address]       [place], [place]  [address]       Ph: [phone]       Fax: [phone]   RxID:   [id number] HYTRIN 1 MG CAPS (TERAZOSIN HCL) 1 by mouth QHS  #90 x 3   Entered and Authorized by:   Loreen Freud DO   Signed by:   Loreen Freud DO on 03/08/2010   Method used:   Faxed to ...       [facility] education [facility])       [address] [address]       [place], [place]  [address]       Ph: [phone]       Fax: [phone]   RxID:   [id number] ULTRAM 50 MG TABS (TRAMADOL HCL) 1 -2 by mouth every 6 hours hours as needed pain  #60 x 1   Entered and Authorized by:   Loreen Freud DO   Signed by:   Loreen Freud DO on 03/08/2010   Method used:   Electronically to        [facility] [phone]* (retail)       [address]       [place], [place]  [address]       Ph: [phone]       Fax: [phone]   RxID:   [id number]

## 2010-10-19 NOTE — Assessment & Plan Note (Signed)
Summary: FOR A COUGH//PH   Vital Signs:  Patient profile:   77 year old male Height:      72.25 inches Weight:      254 pounds BMI:     34.33 Temp:     98.0 degrees F oral Pulse rate:   60 / minute Pulse rhythm:   regular BP sitting:   128 / 76  (left arm) Cuff size:   large  Vitals Entered By: Army Fossa CMA (October 02, 2009 9:51 AM) CC: Pt c/o cough x 2 weeks. He was coughing up mucus at first now it is more of a dry cough. , Cough   History of Present Illness:  Cough      This is a 77 year old man who presents with Cough.  The symptoms began 2 weeks ago.  The patient reports productive cough, but denies non-productive cough, pleuritic chest pain, shortness of breath, wheezing, exertional dyspnea, fever, hemoptysis, and malaise.  Associated symtpoms include chronic rhinitis.  The patient denies the following symptoms: cold/URI symptoms, sore throat, nasal congestion, weight loss, acid reflux symptoms, and peripheral edema.  The cough is worse with lying down.  Ineffective prior treatments have included OTC cough medication.    Allergies (verified): No Known Drug Allergies  Physical Exam  General:  Well-developed,well-nourished,in no acute distress; alert,appropriate and cooperative throughout examination Ears:  External ear exam shows no significant lesions or deformities.  Otoscopic examination reveals clear canals, tympanic membranes are intact bilaterally without bulging, retraction, inflammation or discharge. Hearing is grossly normal bilaterally. Nose:  External nasal examination shows no deformity or inflammation. Nasal mucosa are pink and moist without lesions or exudates. Mouth:  pharyngeal erythema and postnasal drip.   Neck:  No deformities, masses, or tenderness noted. Lungs:  Normal respiratory effort, chest expands symmetrically. Lungs are clear to auscultation, no crackles or wheezes. Heart:  normal rate.   Psych:  Oriented X3 and normally interactive.      Impression & Recommendations:  Problem # 1:  BRONCHITIS- ACUTE (ICD-466.0)  His updated medication list for this problem includes:    Zithromax Z-pak 250 Mg Tabs (Azithromycin) .Marland Kitchen... 2 by mouth x1 then 1 by mouth once daily for 4 more days  Take antibiotics and other medications as directed. Encouraged to push clear liquids, get enough rest, and take acetaminophen as needed. To be seen in 5-7 days if no improvement, sooner if worse.  Complete Medication List: 1)  Hytrin 1 Mg Caps (Terazosin hcl) .Marland Kitchen.. 1 by mouth qhs 2)  Niaspan 1000 Mg Tbcr (Niacin (antihyperlipidemic)) .Marland Kitchen.. 1 once daily 3)  Crestor 20 Mg Tabs (Rosuvastatin calcium) .Marland Kitchen.. 1 qsh 4)  Lisinopril-hydrochlorothiazide 10-12.5 Mg Tabs (Lisinopril-hydrochlorothiazide) .Marland Kitchen.. 1 by mouth once daily 5)  Eq Aspirin 325 Mg Tabs (Aspirin) .... Take 1 tab once daily 6)  Mvi-centrum  .Marland KitchenMarland Kitchen. 1 by mouth once daily 7)  Zostavax [address] Unt/0.74ml Solr (Zoster vaccine live) .Marland Kitchen.. 1 ml im x1 8)  Zithromax Z-pak 250 Mg Tabs (Azithromycin) .... 2 by mouth x1 then 1 by mouth once daily for 4 more days 9)  Xyzal 5 Mg Tabs (Levocetirizine dihydrochloride) .Marland Kitchen.. 1 by mouth qd 10)  Astepro 0.15 % Soln (Azelastine hcl) .... 2 sprays each nostril once daily  Prescriptions: ASTEPRO 0.15 % SOLN (AZELASTINE HCL) 2 sprays each nostril once daily  #1 x 0   Entered and Authorized by:   Loreen Freud DO   Signed by:   Loreen Freud DO on 10/02/2009   Method  used:   Historical   RxID:   [id number] ZITHROMAX Z-PAK 250 MG TABS (AZITHROMYCIN) 2 by mouth x1 then 1 by mouth once daily for 4 more days  #1 x 0   Entered and Authorized by:   Loreen Freud DO   Signed by:   Loreen Freud DO on 10/02/2009   Method used:   Electronically to        [facility] [phone]* (retail)       [address]       [place], [place]  [address]       Ph: [phone]       Fax: [phone]   RxID:   [id number]

## 2010-10-19 NOTE — Letter (Signed)
Summary: [facility]  [facility]   Imported By: Lanelle Bal 11/30/2009 09:17:32  _____________________________________________________________________  External Attachment:    Type:   Image     Comment:   External Document

## 2010-10-19 NOTE — Progress Notes (Signed)
Summary: refill  Phone Note Refill Request Message from:  Fax from Pharmacy on July 29, 2010 8:15 AM  Refills Requested: Medication #1:  CRESTOR 20 MG TABS 1 by mouth qpm [facility] - fax [phone]  Initial call taken by: Okey Regal [place],  July 29, 2010 8:16 AM  Follow-up for Phone Call        lipids were done 04/12/10. Do you want the patient to re-check now or wait 1 year? [name] CMA  July 29, 2010 8:44 AM   Additional Follow-up for Phone Call Additional follow up Details #1::        per labs done in july---labs due now ok to refill crestor x1 Additional Follow-up by: Loreen Freud DO,  July 29, 2010 10:19 AM    New/Updated Medications: CRESTOR 20 MG TABS (ROSUVASTATIN CALCIUM) 1 by mouth qpm**LABS DUE NOW** Prescriptions: CRESTOR 20 MG TABS (ROSUVASTATIN CALCIUM) 1 by mouth qpm**LABS DUE NOW**  #90 x 0   Entered by:   Jeremy Johann CMA   Authorized by:   Loreen Freud DO   Signed by:   Jeremy Johann CMA on 07/29/2010   Method used:   Printed then faxed to ...       [facility])       [address] [address]       [place], [place]  [address]       Ph: [phone]       Fax: [phone]   RxID:   [id number]

## 2010-11-17 ENCOUNTER — Encounter: Payer: Self-pay | Admitting: Family Medicine

## 2010-11-22 ENCOUNTER — Encounter: Payer: Self-pay | Admitting: Family Medicine

## 2010-11-26 ENCOUNTER — Telehealth (INDEPENDENT_AMBULATORY_CARE_PROVIDER_SITE_OTHER): Payer: Self-pay | Admitting: *Deleted

## 2010-11-30 NOTE — Progress Notes (Signed)
Summary: refill  Phone Note Refill Request   Refills Requested: Medication #1:  CRESTOR 20 MG TABS 1 by mouth qpm**LABS DUE NOW** [facility].........Marland KitchenFelecia Deloach CMA  November 26, 2010 2:23 PM      Prescriptions: CRESTOR 20 MG TABS (ROSUVASTATIN CALCIUM) 1 by mouth qpm**LABS DUE NOW**  #90 x 0   Entered by:   Jeremy Johann CMA   Authorized by:   Loreen Freud DO   Signed by:   Jeremy Johann CMA on 11/26/2010   Method used:   Faxed to ...       [facility])       [address] [address]       [place], [place]  [address]       Ph: [phone]       Fax: [phone]   RxID:   [id number]

## 2010-11-30 NOTE — Letter (Signed)
Summary: [facility]  [facility]   Imported By: Maryln Gottron 11/23/2010 11:06:47  _____________________________________________________________________  External Attachment:    Type:   Image     Comment:   External Document

## 2010-12-07 NOTE — Letter (Signed)
Summary: [facility]   Imported By: Maryln Gottron 11/26/2010 11:28:57  _____________________________________________________________________  External Attachment:    Type:   Image     Comment:   External Document

## 2010-12-09 ENCOUNTER — Other Ambulatory Visit: Payer: Self-pay | Admitting: Dermatology

## 2010-12-29 LAB — COMPREHENSIVE METABOLIC PANEL
ALT: 19 U/L (ref 0–53)
AST: 20 U/L (ref 0–37)
CO2: 27 mEq/L (ref 19–32)
Calcium: 9.4 mg/dL (ref 8.4–10.5)
Chloride: 111 mEq/L (ref 96–112)
GFR calc Af Amer: >60 mL/min (ref >60)
GFR calc non Af Amer: >60 mL/min (ref >60)
Sodium: 143 mEq/L (ref 135–145)
Total Bilirubin: 1.1 mg/dL (ref 0.3–1.2)

## 2010-12-29 LAB — PROTIME-INR
INR: 2.5 — ABNORMAL HIGH (ref 0.00–1.49)
INR: 3.8 — ABNORMAL HIGH (ref 0.00–1.49)
Prothrombin Time: 24.9 seconds — ABNORMAL HIGH (ref 11.6–15.2)
Prothrombin Time: 13.4 seconds (ref 11.6–15.2)
Prothrombin Time: 16.5 seconds — ABNORMAL HIGH (ref 11.6–15.2)

## 2010-12-29 LAB — DIFFERENTIAL
Eosinophils Absolute: 0.1 10*3/uL (ref 0.0–0.7)
Eosinophils Relative: 3 % (ref 0–5)
Lymphs Abs: 1.3 10*3/uL (ref 0.7–4.0)

## 2010-12-29 LAB — TYPE AND SCREEN
ABO/RH(D): O POS
Antibody Screen: NEGATIVE

## 2010-12-29 LAB — URINALYSIS, ROUTINE W REFLEX MICROSCOPIC
Glucose, UA: NEGATIVE mg/dL
Hgb urine dipstick: NEGATIVE
Protein, ur: NEGATIVE mg/dL
pH: 5 (ref 5.0–8.0)

## 2010-12-29 LAB — HEMOGLOBIN AND HEMATOCRIT, BLOOD
HCT: 40.3 % (ref 39.0–52.0)
Hemoglobin: 10.8 g/dL — ABNORMAL LOW (ref 13.0–17.0)
Hemoglobin: 13.5 g/dL (ref 13.0–17.0)

## 2010-12-29 LAB — CBC
RBC: 4.56 MIL/uL (ref 4.22–5.81)
WBC: 4.6 10*3/uL (ref 4.0–10.5)

## 2011-01-17 ENCOUNTER — Other Ambulatory Visit: Payer: Self-pay | Admitting: Family Medicine

## 2011-02-01 NOTE — H&P (Signed)
NAME:  Andrew Ramos, Andrew Ramos              ACCOUNT NO.:  [id number]   MEDICAL RECORD NO.:  [id number]          PATIENT TYPE:  INP   LOCATION:  NA                           FACILITY:  [facility]   PHYSICIAN:  Georges Lynch. Gioffre, M.D.DATE OF BIRTH:  Oct 15, 1931   DATE OF ADMISSION:  08/27/2008  DATE OF DISCHARGE:                              HISTORY & PHYSICAL   CHIEF COMPLAINT:  Painful left hip.   HISTORY OF PRESENT ILLNESS:  Patient is a 77 year old gentleman with  progressive painful left hip.  X-rays reveal he has significant  arthritic changes of the hip.  Patient has failed conservative  treatment.  The patient feels that his lack of motion and pain with  ambulation is significantly affecting his activities of daily living.  He would like to proceed with a total hip arthroplasty.  He has also got  significant arthritic changes in his left knee and that may need to be  replaced in the future.   ALLERGIES:  NONE.   CURRENT MEDICATIONS:  1. Hydrochlorothiazide 25 mg a day.  2. Crestor 10 mg a day.  3. Niaspan 1000 mg a day.  4. Terazosin 1 mg a day.  5. Centrum multivitamins.  6. Aspirin 325 mg.   PRIMARY CARE PHYSICIAN:  Dr. Loreen Freud.   UROLOGIST:  Dr. Heloise Purpura.   DERMATOLOGIST:  Elmon Else, MD.   CURRENT MEDICAL HISTORY:  Includes:  1. Hearing impaired with bilateral hearing aids.  2. Hypertension.  3. Hypercholesterolemia.  4. History of hepatitis.  5. History of prostate cancer status post radiation treatment and      brady therapy in 2007.  6. History of blood clots and superficial clot in his left ankle,      treated with aspirin in 2009.   PAST SURGICAL HISTORY:  Includes:  1. Tonsillectomy in 1942.  2. Brady therapy for prostate cancer in December 2007 without      complications with anesthesia.   REVIEW OF SYSTEMS:  NEUROLOGIC:  Just the hearing impaired.  He has had  no previous strokes, seizure convulsions, or addictions.  PULMONARY:  Unremarkable.   CARDIOVASCULAR:  He has never had a stress test.  He  denies any chest pains or heart irregularities.  GI:  In 1992, he was a  very frequent blood donor.  He came up positive for hepatitis without  any symptoms or subsequent problems.  GU:  Positive for prostate cancer  with recent brady therapy and radiation.  ENDOCRINE:  Unremarkable.  HEMATOLOGIC:  He does have history of superficial blood clot, left  ankle, in September 2009, treated with aspirin.   SOCIAL HISTORY:  Patient is married.  He is retired.  He has never  smoked.  Occasional alcoholic beverage.  He has got a daughter and a  son.  He will be staying with family postop.  He lives in a home, 1  story.  He does have a living will and power of attorney.   FAMILY MEDICAL HISTORY:  Father is deceased at the age of 66 from  complications from a heart attack and pancreatic cancer.  Mother is  deceased at the age of 65 from a heart attack.  One brother deceased at  the age of 16 from pancreatic cancer.   PHYSICAL EXAM:  VITALS:  Height is 6 feet.  Weight is 250 pounds.  Blood  pressure is 152/82.  Pulse is 74.  Respirations are 12, nonlabored.  Patient is afebrile.  GENERAL:  This is a healthy-appearing gentleman, conscious, alert, and  appropriate.  Appears to be a good historian but has difficulty getting  on and off the exam table with motion of his left hip.  HEENT:  Head was normocephalic.  Pupils equal, round, and reactive.  He  had bilateral hearing aids in place.  NECK:  Supple.  No palpable lymphadenopathy.  Good range of motion.  CHEST:  Lung sounds were clear and equal bilaterally.  No wheezes,  rales, or rhonchi.  HEART:  Regular rate and rhythm.  ABDOMEN:  Soft.  Bowel sounds present.  EXTREMITIES:  He had good range of motion of his shoulders, elbows, and  wrists.  Motor strength was 5/5.  LOWER EXTREMITIES:  Right hip he could fully extend it.  He could flex  it up to 130 with 30 degrees of internal/external  rotation without any  discomfort.  His left hip he was able to fully extend it.  He was only  able to bring it up to 90 degrees.  He had significant pain with any  attempts at internal/external rotation but he had about 10 degrees of  external rotation, 0 degrees of internal rotation.  Both knees were  boggy appearing.  His left knee was painful but he was able to fully  extend it and he could flex it back to 120 degrees, no instability.  His  right knee he was able to fully extend it.  He could flex it back to 120  easily without any discomfort.  Calves were soft.  Ankles, good motion.  PERIPHERAL VASCULAR:  Carotid pulses were 2+.  No bruits.  Radial pulses  2+.  Posterior tibial pulses were 1+ with significant edema in his left  lower extremity.  He had no venostasis changes.  NEUROLOGIC:  Patient was conscious, alert, and appropriate.  No gross  neurologic defects.  BREAST EXAM:  Deferred at this time.  RECTAL EXAM:  Deferred at this time.  GU EXAM:  Deferred at this time.   IMPRESSION:  1. Severe osteoarthritis, left hip.  2. Osteoarthritis, left knee.  3. Hypertension.  4. Hypercholesterolemia.  5. Hearing impaired, bilateral hearing aids.  6. History of hepatitis.  7. History of pancreatic cancer status post brady therapy.  8. History of blood clot, left ankle, superficial, with aspirin      treatment.   PLAN:  Patient will undergo all routine labs and tests prior to having a  left total hip arthroplasty by Dr. Darrelyn Hillock at [facility] on  August 27, 2008.  Patient has been cleared by his primary care  physician for this upcoming surgical procedure.      Jamelle Rushing, P.A.    ______________________________  Georges Lynch Darrelyn Hillock, M.D.    RWK/MEDQ  D:  08/19/2008  T:  08/19/2008  Job:  [id number]   cc:   Windy Fast A. Darrelyn Hillock, M.D.  Fax: [phone]

## 2011-02-01 NOTE — Op Note (Signed)
NAME:  Andrew Ramos, Andrew Ramos              ACCOUNT NO.:  [id number]   MEDICAL RECORD NO.:  [id number]          PATIENT TYPE:  INP   LOCATION:  0098                         FACILITY:  [facility]   PHYSICIAN:  Georges Lynch. Gioffre, M.D.DATE OF BIRTH:  [birth year]   DATE OF PROCEDURE:  12/31/2008  DATE OF DISCHARGE:                               OPERATIVE REPORT   SURGEON:  Dr. Darrelyn Hillock   ASSISTANT:  Nurse.   PREOPERATIVE DIAGNOSES:  Severe degenerative arthritis left knee with a  valgus deformity.   POSTOPERATIVE DIAGNOSES:  Severe degenerative arthritis left knee with a  valgus deformity.   OPERATION:  1. Left total knee arthroplasty utilizing DePuy system.  All three      components were cemented.  I used vancomycin in the cement.  The      sizes used was a size 6 left femoral component posterior cruciate      stabilizer.  2. The tibial insert was a size 6, the tibial tray was a size 6, the      insert was a size 6 at 12.5 mm thickness.  The patella was a size      38 with three pegs.   DESCRIPTION OF PROCEDURE:  Under general anesthesia, routine orthopedic  prep and draping of the left lower extremity was carried out.  He had 2  grams of IV Ancef preop.  At this time, the leg was exsanguinated with  an Esmarch and the tourniquet was elevated at 350 degrees.  The knee was  flexed.  An anterior approach to the knee was carried out.  Bleeders  identified and cauterized.  At this time, two flaps were created.  I  then went down and did a median parapatellar incision.  I reflected the  patella laterally, flexed the knee.  I then did medial and lateral  meniscectomies and excised the anterior and posterior cruciate  ligaments.  Following that, the initial drill holes made in the  intercondylar notch.  I removed 12 mm thickness off the distal femur.  At that time, I then went down and measured the femur to be a size 6  left.  I then cut the femur for a size 6 left femoral component.  Following  that, I went down and prepared the tibia.  The tibial tray was  measured be a size 6.  I utilized the intramedullary guide, as well as  the external guide to line up the tibial tray.  I removed approximately  6 mm of thickness off the tibia because of a severe contracture and  collapse of the joint space.  Following that, a keel cut was made in the  tibial plateau.  I then completed the preparation of the femur.  My  notch cut was made in the usual fashion.  Following that, the trial  inserts were inserted.  I first selected a 10-mm thickness and that was  not stable.  I also went to a 12.5 mm thickness tibial tray which fit  very well.  We had good stability, good extension.  Following that, I  then prepared the patella.  I did a resurfacing procedure on the patella  for a size 38 patella.  Three drill holes were made in the patella.  I  then removed all the components, thoroughly water picked out the knee  and cemented all three components in simultaneously.  Prior to doing  that, I injected 30 mL of quarter-percent Marcaine with 30 mg of Toradol  into the soft tissue structures.  Following that, before inserting the  permanent tibial tray, I did inject 10 mL of FloSeal posteriorly.  I  bone waxed the raw bone ends as well.  I then inserted my permanent  tibial tray which was a 12.5 mm  thickness size 6, reduced the knee.  We had good flexion, good extension  and good stability.  I then inserted a Hemovac drain and closed the knee  in layers in the usual fashion.  The skin was closed with metal staples.  A sterile Neosporin dressing was applied.  The patient left the  operative in satisfactory condition.           ______________________________  Georges Lynch Darrelyn Hillock, M.D.     RAG/MEDQ  D:  12/31/2008  T:  12/31/2008  Job:  [id number]

## 2011-02-01 NOTE — Discharge Summary (Signed)
NAME:  [name], [name]              ACCOUNT NO.:  [id number]   MEDICAL RECORD NO.:  [id number]          PATIENT TYPE:  INP   LOCATION:  1603                         FACILITY:  [facility]   PHYSICIAN:  Georges Lynch. Gioffre, M.D.DATE OF BIRTH:  01-25-1932   DATE OF ADMISSION:  12/31/2008  DATE OF DISCHARGE:  01/04/2009                               DISCHARGE SUMMARY   Andrew Ramos came into the hospital December 31, 2008, at which time I did  a left total knee arthroplasty on him for severe degenerative arthritis  of his left knee.  That was done on December 31, 2008.  He was seen daily  after that.  He was started on heparin and Coumadin protocol.  His  Hemovac drain.  He was seen on January 01, 2009, doing well, hemoglobin  11.7.  The dressing was saturated so we removed the dressing, changed  his dressing and removed his Hemovac drain.  He had a good dorsalis  pedis pulse.  We did call Genev[facility] Norlander for home health services.  The patient  as I said was up and ambulating, full weightbearing with PT and a  walker.  He was also started on a knee machine.  He was seen on January 02, 2009, doing well.  The dressing now was dry.  The wound looked good.  His hemoglobin 10.9, temperature 99, INR was 2.5.   Our plan was to discharge him on the following Saturday.  He was seen on  January 02, 2009.  The wound was good.  He had no particular  complications.  He was up ambulating.  He remained on Coumadin.  He was  then seen on January 04, 2009.  His left knee wound was clean and he was  discharged to be followed in the office in 2 weeks.   DISCHARGE DIAGNOSES:  1. Osteoarthritis of left knee.  2. Previous hip joint replacement on the left.  3. Hyperlipidemia.  4. Hypertension.  5. History of prostate cancer.   DISCHARGE INSTRUCTIONS:  He will ambulate full weightbearing with a  walker.  He will be on Coumadin 5 mg a day.  His INR will be monitored  weekly and I will see him in the office 2 weeks from the day of  surgery  for suture removal and start him on therapy in the office.  Prior to  coming to the office he will be started on therapy at home.   LABORATORY STUDIES:  Initially his white count was 4600, hemoglobin  14.3, hematocrit 42.4, platelet count was little low at 130.  Sodium  143, potassium 3.8, chloride 111, glucose 124, BUN 19, creatinine 1.17,  calcium 9.4, SGOT 20, SGPT 19, alkaline phosphatase 74, bilirubin was  1.1.  His INR was 1.  PT was 13.4.  PTT was 29.  His urinalysis was  negative.  The remaining studies, his EKG he had was reported on  basically within normal limits as far as surgical clearance.           ______________________________  Georges Lynch. Darrelyn Hillock, M.D.     RAG/MEDQ  D:  01/16/2009  T:  01/16/2009  Job:  [id number]

## 2011-02-01 NOTE — Op Note (Signed)
NAME:  Andrew Ramos, Andrew Ramos              ACCOUNT NO.:  [id number]   MEDICAL RECORD NO.:  [id number]          PATIENT TYPE:  INP   LOCATION:  0005                         [facility]ACILITY:  [facility]   PHYSICIAN:  Georges Lynch. Gioffre, M.D.DATE OF BIRTH:  12-01-1931   DATE OF PROCEDURE:  08/27/2008  DATE OF DISCHARGE:                               OPERATIVE REPORT   SURGEON:  Georges Lynch. Darrelyn Hillock, M.D.   ASSISTANT:  Jamelle Rushing, P.A. and Madlyn Frankel. Charlann Boxer, M.D.   PREOPERATIVE DIAGNOSIS:  Avascular necrosis of the left hip.   POSTOPERATIVE DIAGNOSIS:  Avascular necrosis of the left hip.   OPERATION:  Left total hip arthroplasty utilizing DePuy system, utilized  a size 11 standard offset Tri-Lock stem, the cup was a Pinnacle cup that  measured 56 mm, the insert was a metal insert 48 mm  inside diameter.  We utilized two cancellous screws, sizes were a 30 and 35 mm.  We  utilized the hole eliminator as well into the cup.  The C-tapered head  was a +5 with a metal-on-metal prosthesis.   PROCEDURE:  Under general anesthesia with the patient on the right side,  left side up, a routine orthopedic prep and drape of the left hip was  carried out.  The patient had 2 g of IV Ancef preop.  A posterolateral  approach to the hip was carried out.  Bleeders identified and  cauterized.  An incision was made over the greater trochanter and  extended out into the gluteal region.  I dissected the gluteal muscle by  blunt dissection.  Great care was taken to avoid the sciatic nerve.  I  then went down and detached the external rotators in the usual fashion  and exposed the head.  I did a capsulectomy, dislocated the head and  amputated the femoral head at the appropriate neck length.  The  measurements were taken before the cut was made.  Following that, we  then utilized the box osteotome to remove the cancellous bone from the  greater trochanteric area.  We then used the widening reamer and then we  went up and rasped to  a size 11 stem.  We then packed the femoral canal  with a sponge which was later removed and then completed the  capsulectomy around the capsule, reamed the acetabulum up to a size 55  mm for a 56-mm outside diameter cup.  Once the cup was inserted, 2  screws were utilized for fixation of the cup.  I thoroughly irrigated  out the area.  We inserted our metal insert which was an inside diameter  of 40 mm.  We then inserted our permanent Tri-Lock stem and went through  trials again and selected the +5 C-tapered head mainly because of  stability purposes and leg length purposes.  Leg lengths were measured  several times during the procedure.  I would like to say also that the  size of the diameter of the head was 40-mm metal ball with a +5.  The  hip was reduced after we cleared the  acetabulum from all soft tissues and blood  and reduced the hip.  It had  good stability.  Irrigated the hip out, reapproximated soft tissue  structures in the usual fashion.  The skin was closed with metal  staples.  Sterile Neosporin dressing applied.  Estimated blood loss  about 250 mL.           ______________________________  Georges Lynch. Darrelyn Hillock, M.D.     RAG/MEDQ  D:  08/27/2008  T:  08/27/2008  Job:  [id number]   cc:   Titus Dubin. Alwyn Ren, MD,FACP,FCCP  [phone] [place]  [place] [address]

## 2011-02-04 NOTE — Op Note (Signed)
NAME:  Andrew Ramos, Andrew Ramos              ACCOUNT NO.:  [id number]   MEDICAL RECORD NO.:  [id number]          PATIENT TYPE:  AMB   LOCATION:  [facility]                         FACILITY:  [facility]   PHYSICIAN:  Claudette Laws, M.D.  DATE OF BIRTH:  01-15-1932   DATE OF PROCEDURE:  08/03/2006  DATE OF DISCHARGE:                               OPERATIVE REPORT   PREOPERATIVE DIAGNOSIS:  Clinical stage T1c Gleason grade VI carcinoma  of the prostate gland.   POSTOPERATIVE DIAGNOSIS:  Clinical stage T1c Gleason grade VI carcinoma  of the prostate gland.   OPERATION:  Transperineal radioactive seed implantation along with  cystoscopy.   SURGEON:  Claudette Laws, M.D.   HISTORY:  This is a 77 year old man recently diagnosed with a low volume  Gleason grade VI T1c carcinoma of the prostate gland.  After going over  treatment options, he elected to go ahead with a radioactive seed  implantation.  We discussed complications such as postop bleeding,  infection, urosepsis, as well as possible rectal injury.  He understands  and agrees to the proposed surgery.  Because of the size of the gland,  he was downsized with one shot of Lupron 2-3 months ago and also has  been taking the Casodex 50 mg a day.   PROCEDURE:  The patient was prepped and draped in the exaggerated dorsal  lithotomy position under intubated general anesthesia.  Dr. Chipper Herb from radiation oncology initially did the preop planning.  The  appropriate anchors were placed and then I entered the operative field  and using the Nucletron robotic arm, we delivered a total of 71 seeds  using 28 needles.  The total apparent activity was 27.69 mCi.  This was  an I-125 seed.   The patient tolerated the procedure well.  I removed the catheter.  Rigid cystoscopy again showed a normal anterior urethra but no obvious  seeds in the prostatic urethra.  A wide open bladder neck, normal smooth  bladder, no obvious a seeds or foreign body, normal  ureteral orifices. I  replaced the #16-French Foley catheter and he was taken back to the  recovery room in satisfactory condition.      Claudette Laws, M.D.  Electronically Signed     RFS/MEDQ  D:  08/03/2006  T:  08/03/2006  Job:  [id number]

## 2011-02-04 NOTE — Discharge Summary (Signed)
NAME:  Andrew Ramos, Andrew Ramos              ACCOUNT NO.:  [id number]   MEDICAL RECORD NO.:  [id number]          PATIENT TYPE:  INP   LOCATION:  1533                         FACILITY:  [facility]   PHYSICIAN:  Georges Lynch. Gioffre, M.D.DATE OF BIRTH:  16-Sep-1932   DATE OF ADMISSION:  08/27/2008  DATE OF DISCHARGE:  08/31/2008                               DISCHARGE SUMMARY   ADMISSION DIAGNOSES:  1. Severe osteoarthritis left hip.  2. Osteoarthritis left knee.  3. Hypertension.  4. Hypercholesterolemia.  5. Hearing impaired, bilateral hearing aids.  6. History of hepatitis.  7. History of prostate cancer with status post brachytherapy.  8. History of blood clots, left ankle, superficial, with aspirin      treatment.   DISCHARGE DIAGNOSES:  1. Status post left total hip arthroplasty.  2. History of hypertension.  3. History of hypercholesterolemia.  4. History of hearing impaired, bilateral hearing aids.  5. History of hepatitis.  6. History of prostate cancer, status post brachytherapy.  7. History of blood clots, left ankle, superficial, with aspirin      treatment.  8. Osteoarthritis, left knee.   HISTORY OF PRESENT ILLNESS:  The patient is a 77 year old gentleman with  progressively worsening painful left hip.  X-rays reveal significant  arthritic changes of the hip.  He has failed conservative treatment.  The patient feels that, due to his lack of motion and pain, this is  significantly affecting activities of daily living.  The patient elected  proceed with a total hip arthroplasty on the left.   ALLERGIES:  No known drug allergies.   MEDICATIONS ON ADMISSION:  1. Hydrochlorothiazide 25 mg a day.  2. Crestor 10 mg a day.  3. Niaspan 1000 mg a day.  4. Terazosin 1 mg a day.  5. Centrum multivitamins.  6. Aspirin 325 mg a day.   SURGICAL PROCEDURES:  On August 27, 2008 the patient was taken to the  OR by Dr. Worthy Rancher, assisted by Dr. Lajoyce Corners and Oneida Alar, PA-C.  Under  general anesthesia the patient underwent a left total hip  arthroplasty with a Tri-Lock Pinnacle system.  The patient's femoral  head was sent to pathology for evaluation.  Estimated blood loss was 250  mL.  There were no complications.  The patient was transferred to the  recovery room then to the orthopedic floor in good condition to follow  total hip arthroplasty protocol.  The patient had the following  components implanted.  A size 11 standard offset Tri-Lock stem, a size  56 Pinnacle Sector 2 acetabular cup, a size 56 outside diameter 40 mm  inside diameter Pinnacle metal insert, a size 40 +5 metal-on-metal  femoral head, a size 6.5 35 and 30 mm cancellous screw, one apex hole  eliminator.   CONSULTS:  The following routine consults were requested:  Physical  therapy, case management, pharmacy for Coumadin monitoring.   A [place] medical consult was requested due to the patient's multiple  medical issues.   HOSPITAL COURSE:  On August 27, 2008 the patient was admitted to [facility] under the care of Dr.  Ranee Gosselin.  The patient was  taken to the OR where a left total hip arthroplasty was performed  without any complications.  The patient was transferred to the recovery  room and then to the orthopedic floor in good condition.  Followup total  hip protocol with IV antibiotics, pain medicines and Coumadin and  heparin for DVT prophylaxis.  The patient then spent 4 days  postoperative course on the orthopedic floor, doing very well.  Due to  his multiple medical issues, a [place] consult was requested for his  multiple medical issues but the patient did not incur any medical  complications during his hospitalization and they did not require to  change any of his medications or any other treatments.  The patient was  able to wean off his IV pain medicines well.  He did have expected  postoperative blood loss but it was not to the point where he became  anemic.  He  did not have any symptoms related to his blood loss.  He did  have a routine postoperative temperatures which resolved with time and  the use of deep breathing and inspiratory spirometer.  The patient's  wound remained benign for any signs of infection.  His leg remained  neuromotor vascularly intact.  The patient worked well with his physical  therapy.  He was able to ambulate 75 feet with just close supervision  with the use of a walker so on postop day #4 he was medically and  orthopedically ready for discharge home.  He was discharged in good  condition with outpatient home health physical therapy and RN for  monitoring Coumadin and follow-up instructions.   LABS:  CBC on admission found WBC 5.8, hemoglobin 14.9, hematocrit 43.9,  platelets 174.  Date of discharge his H and H was 10.9/32.5.  INR was  2.4 on date of discharge.  Routine chemistries on admission within  normal limits.  Urinalysis unremarkable preop.   DISCHARGE INSTRUCTIONS:   DIET:  No restrictions.   ACTIVITY:  The patient may ambulate with the use of crutches or walker.  He is to be touch-down weightbearing.  He may shower in 5 days.   WOUND CARE:  He is to change his dressing on a daily basis.   FOLLOW-UP:  He needs follow-up with Dr. Darrelyn Hillock in the office 2 weeks  from date of surgery.  The patient is to call [phone] for that follow-  up appointment.   Home health physical therapy and RN for Coumadin monitoring through  [place].   MEDICATIONS:  1. Percocet 10/650 one tablet every 4-6 hours for pain if needed.  2. Robaxin 500 mg 1 tablet every 6 hours for muscle spasms if needed.  3. Coumadin 5 mg tablets take one half of a tablet or 2.5 mg daily      unless changed by the [facility] pharmacist.  4. Hydrochlorothiazide 12.5 mg a day.  5. Crestor 10 mg a day.  6. Niaspan 1000 mg a day.  7. Terazosin 1 mg a day.  8. Centrum Silver 1 tablet a day.  9. Aspirin on hold until completed Coumadin therapy.    CONDITION UPON DISCHARGE TO HOME:  Listed improved and good.      Jamelle Rushing, P.A.    ______________________________  Georges Lynch Darrelyn Hillock, M.D.    RWK/MEDQ  D:  09/24/2008  T:  09/24/2008  Job:  [id number]

## 2011-02-09 ENCOUNTER — Other Ambulatory Visit: Payer: Self-pay

## 2011-02-09 MED ORDER — LISINOPRIL-HYDROCHLOROTHIAZIDE 10-12.5 MG PO TABS: 1.0000 | ORAL_TABLET | Freq: Every day | ORAL | Status: DC

## 2011-03-13 ENCOUNTER — Other Ambulatory Visit: Payer: Self-pay | Admitting: Family Medicine

## 2011-03-27 ENCOUNTER — Other Ambulatory Visit: Payer: Self-pay | Admitting: Family Medicine

## 2011-03-28 ENCOUNTER — Other Ambulatory Visit: Payer: Self-pay

## 2011-03-28 MED ORDER — LISINOPRIL-HYDROCHLOROTHIAZIDE 10-12.5 MG PO TABS: 1.0000 | ORAL_TABLET | Freq: Every day | ORAL | Status: DC

## 2011-03-28 NOTE — Telephone Encounter (Signed)
Call from patient and he stated [facility] has not sent his Rx yet and wanted a 30 day supply sent the local pharmacy    KP

## 2011-05-09 ENCOUNTER — Encounter: Payer: Self-pay | Admitting: Family Medicine

## 2011-05-09 ENCOUNTER — Ambulatory Visit (INDEPENDENT_AMBULATORY_CARE_PROVIDER_SITE_OTHER): Payer: Medicare Other | Admitting: Family Medicine

## 2011-05-09 VITALS — BP 132/90 | HR 74 | Temp 98.7°F | Ht 72.0 in | Wt 252.8 lb

## 2011-05-09 DIAGNOSIS — I10 Essential (primary) hypertension: Secondary | ICD-10-CM

## 2011-05-09 DIAGNOSIS — C61 Malignant neoplasm of prostate: Secondary | ICD-10-CM

## 2011-05-09 DIAGNOSIS — E785 Hyperlipidemia, unspecified: Secondary | ICD-10-CM

## 2011-05-09 DIAGNOSIS — Z0000 Encounter for general adult medical examination without abnormal findings: Secondary | ICD-10-CM

## 2011-05-09 DIAGNOSIS — M353 Polymyalgia rheumatica: Secondary | ICD-10-CM

## 2011-05-09 LAB — LIPID PANEL
Cholesterol: 118 mg/dL (ref 0–200)
HDL: 43.5 mg/dL (ref >39.00)
LDL Cholesterol: 51 mg/dL (ref 0–99)
Total CHOL/HDL Ratio: 3
Triglycerides: 117 mg/dL (ref 0.0–149.0)

## 2011-05-09 LAB — HEPATIC FUNCTION PANEL
Bilirubin, Direct: 0.2 mg/dL (ref 0.0–0.3)
Total Bilirubin: 1.3 mg/dL — ABNORMAL HIGH (ref 0.3–1.2)
Total Protein: 6.8 g/dL (ref 6.0–8.3)

## 2011-05-09 LAB — BASIC METABOLIC PANEL
BUN: 20 mg/dL (ref 6–23)
CO2: 29 mEq/L (ref 19–32)
Chloride: 103 mEq/L (ref 96–112)
Creatinine, Ser: 1.2 mg/dL (ref 0.4–1.5)
Glucose, Bld: 107 mg/dL — ABNORMAL HIGH (ref 70–99)

## 2011-05-09 LAB — POCT URINALYSIS DIPSTICK
Bilirubin, UA: NEGATIVE
Blood, UA: NEGATIVE
Glucose, UA: NEGATIVE
Nitrite, UA: NEGATIVE
Spec Grav, UA: 1.005
pH, UA: 6

## 2011-05-09 LAB — CBC WITH DIFFERENTIAL/PLATELET
Eosinophils Absolute: 0.1 10*3/uL (ref 0.0–0.7)
HCT: 45.3 % (ref 39.0–52.0)
Lymphs Abs: 1.3 10*3/uL (ref 0.7–4.0)
MCHC: 33.9 g/dL (ref 30.0–36.0)
MCV: 96.3 fl (ref 78.0–100.0)
Monocytes Absolute: 0.5 10*3/uL (ref 0.1–1.0)
Neutrophils Relative %: 63.6 % (ref 43.0–77.0)
Platelets: 181 10*3/uL (ref 150.0–400.0)

## 2011-05-09 MED ORDER — LISINOPRIL-HYDROCHLOROTHIAZIDE 10-12.5 MG PO TABS: 1.0000 | ORAL_TABLET | Freq: Every day | ORAL | Status: DC

## 2011-05-09 MED ORDER — ZOSTER VACCINE LIVE 19400 UNT/0.65ML ~~LOC~~ SOLR: 0.6500 mL | Freq: Once | SUBCUTANEOUS | Status: DC

## 2011-05-09 NOTE — Progress Notes (Signed)
Subjective:    Andrew Ramos is a 77 y.o. male who presents for Medicare Annual/Subsequent preventive examination.   Preventive Screening-Counseling & Management  Tobacco History  Smoking status  . Never Smoker   Smokeless tobacco  . Never Used    Problems Prior to Visit 1.   Current Problems (verified) Patient Active Problem List  Diagnoses  . UNSPECIFIED VITAMIN D DEFICIENCY  . HYPERLIPIDEMIA  . Unspecified Hearing Loss  . HYPERTENSION  . SUPERFICIAL PHLEBITIS  . EXTERNAL HEMORRHOIDS  . DIVERTICULOSIS, COLON  . GUAIAC POSITIVE STOOL  . CELLULITIS AND ABSCESS OF LEG EXCEPT FOOT  . OSTEOARTHRITIS  . Pain in Joint, Shoulder Region  . HIP PAIN, LEFT  . PAIN IN JOINT, MULTIPLE SITES  . MYALGIA  . CALF PAIN, LEFT  . EDEMA  . PROSTATE CANCER, HX OF  . THYROID NODULE, HX OF  . COLONIC POLYPS, HX OF  . PROBLEMS WITH HEARING  . TOTAL KNEE REPLACEMENT, LEFT, HX OF    Medications Prior to Visit Current Outpatient Prescriptions on File Prior to Visit  Medication Sig Dispense Refill  . CRESTOR 20 MG tablet TAKE 1 TABLET BY MOUTH EVERY EVENING (LABS DUE NOW)  90 tablet  1  . terazosin (HYTRIN) 1 MG capsule TAKE 1 CAPSULE BY MOUTH AT BEDTIME  90 capsule  2  . DISCONTD: lisinopril-hydrochlorothiazide (PRINZIDE,ZESTORETIC) 10-12.5 MG per tablet Take 1 tablet by mouth daily.  30 tablet  1    Current Medications (verified) Current Outpatient Prescriptions  Medication Sig Dispense Refill  . alendronate (FOSAMAX) 70 MG tablet Take 70 mg by mouth every 7 (seven) days. Take with a full glass of water on an empty stomach.       Marland Kitchen aspirin 325 MG tablet Take 325 mg by mouth daily.        . calcium carbonate (OS-CAL) 600 MG TABS Take 600 mg by mouth daily.        . Cholecalciferol (VITAMIN D3) 1000 UNITS tablet Take 1,000 Units by mouth daily.        . CRESTOR 20 MG tablet TAKE 1 TABLET BY MOUTH EVERY EVENING (LABS DUE NOW)  90 tablet  1  . lisinopril-hydrochlorothiazide  (PRINZIDE,ZESTORETIC) 10-12.5 MG per tablet Take 1 tablet by mouth daily.  90 tablet  3  . Multiple Vitamins-Minerals (CENTRUM SILVER ULTRA MENS) TABS Take 1 tablet by mouth daily.        . predniSONE (DELTASONE) 20 MG tablet Take 20 mg by mouth daily.        Marland Kitchen terazosin (HYTRIN) 1 MG capsule TAKE 1 CAPSULE BY MOUTH AT BEDTIME  90 capsule  2  . DISCONTD: lisinopril-hydrochlorothiazide (PRINZIDE,ZESTORETIC) 10-12.5 MG per tablet Take 1 tablet by mouth daily.  30 tablet  1  . zoster vaccine live, PF, (ZOSTAVAX) 08657 UNT/0.65ML injection Inject 19,400 Units into the skin once.  1 vial  0     Allergies (verified) Review of patient's allergies indicates no known allergies.   PAST HISTORY  Family History Family History  Problem Relation Age of Onset  . Coronary artery disease Mother 64  . Heart disease Mother 32    MI  . Coronary artery disease Father 41  . Pancreatic cancer Father   . Cancer Father     pancreatic  . Pancreatic cancer Brother 92  . Cancer Brother     pancreatic    Social History History  Substance Use Topics  . Smoking status: Never Smoker   . Smokeless tobacco: Never  Used  . Alcohol Use: Yes    Are there smokers in your home (other than you)?  No  Risk Factors Current exercise habits: pt is exercising  Dietary issues discussed: none   Cardiac risk factors: dyslipidemia, hypertension, male gender and obesity (BMI >= 30 kg/m2).  Depression Screen (Note: if answer to either of the following is "Yes", a more complete depression screening is indicated)   Q1: Over the past two weeks, have you felt down, depressed or hopeless? No  Q2: Over the past two weeks, have you felt little interest or pleasure in doing things? No  Have you lost interest or pleasure in daily life? No  Do you often feel hopeless? No  Do you cry easily over simple problems? No  Activities of Daily Living In your present state of health, do you have any difficulty performing the following  activities?:  Driving? No Managing money?  No Feeding yourself? No Getting from bed to chair? No Climbing a flight of stairs? No Preparing food and eating?: No Bathing or showering? No Getting dressed: No Getting to the toilet? No Using the toilet:No Moving around from place to place: No In the past year have you fallen or had a near fall?:No   Are you sexually active?  Yes  Do you have more than one partner?  no  Hearing Difficulties: Yes Do you often ask people to speak up or repeat themselves? Yes Do you experience ringing or noises in your ears? Yes Do you have difficulty understanding soft or whispered voices? Yes   Do you feel that you have a problem with memory? No  Do you often misplace items? No  Do you feel safe at home?  No  Cognitive Testing  Alert? Yes  Normal Appearance?Yes  Oriented to person? Yes  Place? Yes   Time? Yes  Recall of three objects?  Yes  Can perform simple calculations? Yes  Displays appropriate judgment?Yes  Can read the correct time from a watch face?Yes   Advanced Directives have been discussed with the patient? Yes   List the Names of Other Physician/Practitioners you currently use: 1.    Indicate any recent Medical Services you may have received from other than [facility] providers in the past year (date may be approximate).  Immunization History  Administered Date(s) Administered  . H1N1 10/21/2008  . Influenza Whole 06/25/2008, 06/15/2009  . Pneumococcal Polysaccharide 09/26/2001  . Td 10/02/2002    Screening Tests Health Maintenance  Topic Date Due  . Colonoscopy  01/27/1984  . Zostavax  01/26/1994  . Influenza Vaccine  06/20/2011  . Tetanus/tdap  10/02/2012  . Pneumococcal Polysaccharide Vaccine Age 22 And Over  Completed    All answers were reviewed with the patient and necessary referrals were made:  Loreen Freud, DO   05/09/2011   History reviewed: allergies, current medications, past family history, past medical history,  past social history, past surgical history and problem list  Review of Systems  Review of Systems  Constitutional: Negative for activity change, appetite change and fatigue.  HENT: Negative for hearing loss, congestion, tinnitus and ear discharge.   Eyes: Negative for visual disturbance (see optho q1y -- vision corrected to 20/20 with glasses).  Respiratory: Negative for cough, chest tightness and shortness of breath.   Cardiovascular: Negative for chest pain, palpitations and leg swelling.  Gastrointestinal: Negative for abdominal pain, diarrhea, constipation and abdominal distention.  Genitourinary: Negative for urgency, frequency, decreased urine volume and difficulty urinating.  Musculoskeletal: Negative for  back pain, arthralgias and gait problem.  Skin: Negative for color change, pallor and rash.  Neurological: Negative for dizziness, light-headedness, numbness and headaches.  Hematological: Negative for adenopathy. Does not bruise/bleed easily.  Psychiatric/Behavioral: Negative for suicidal ideas, confusion, sleep disturbance, self-injury, dysphoric mood, decreased concentration and agitation.  Pt is able to read and write and can do all ADLs No risk for falling No abuse/ violence in home      Objective:     Vision by Snellen chart: right eye:pt sees optho, left ZOX:WRUEA Blood pressure 132/90, pulse 74, temperature 98.7 F (37.1 C), temperature source Oral, height 6' (1.829 m), weight 252 lb 12.8 oz (114.669 kg), SpO2 96.00%. Body mass index is 34.29 kg/(m^2).  BP 132/90  Pulse 74  Temp(Src) 98.7 F (37.1 C) (Oral)  Ht 6' (1.829 m)  Wt 252 lb 12.8 oz (114.669 kg)  BMI 34.29 kg/m2  SpO2 96%  General Appearance:    Alert, cooperative, no distress, appears stated age  Head:    Normocephalic, without obvious abnormality, atraumatic  Eyes:    PERRL, conjunctiva/corneas clear, EOM's intact, fundi    benign, both eyes       Ears:    Normal TM's and external ear canals,  both ears  Nose:   Nares normal, septum midline, mucosa normal, no drainage    or sinus tenderness  Throat:   Lips, mucosa, and tongue normal; teeth and gums normal  Neck:   Supple, symmetrical, trachea midline, no adenopathy;       thyroid:  No enlargement/tenderness/nodules; no carotid   bruit or JVD  Back:     Symmetric, no curvature, ROM normal, no CVA tenderness  Lungs:     Clear to auscultation bilaterally, respirations unlabored  Chest wall:    No tenderness or deformity  Heart:    Regular rate and rhythm, S1 and S2 normal, no murmur, rub   or gallop  Abdomen:     Soft, non-tender, bowel sounds active all four quadrants,    no masses, no organomegaly  Genitalia:    urology  Rectal:  urology  Extremities:   Extremities normal, atraumatic, no cyanosis or edema  Pulses:   2+ and symmetric all extremities  Skin:   Skin color, texture, turgor normal, no rashes or lesions  Lymph nodes:   Cervical, supraclavicular, and axillary nodes normal  Neurologic:   CNII-XII intact. Normal strength, sensation and reflexes      throughout       Assessment     Preventative exam Htn=--- con't meds,  Check labs Hx prostate ca--- pt sees urology q1y Hyperlipidemia-- con't meds , check labs   OA PMR--per rheum   Plan:     During the course of the visit the patient was educated and counseled about appropriate screening and preventive services including:    Screening electrocardiogram  Prostate cancer screening  Colorectal cancer screening x Diet review for nutrition referral? Yes ____  Not Indicated __x__   Patient Instructions (the written plan) was given to the patient.  Medicare Attestation I have personally reviewed: The patient's medical and social history Their use of alcohol, tobacco or illicit drugs Their current medications and supplements The patient's functional ability including ADLs,fall risks, home safety risks, cognitive, and hearing and visual impairment Diet and  physical activities Evidence for depression or mood disorders  The patient's weight, height, BMI, and visual acuity have been recorded in the chart.  I have made referrals, counseling, and provided education to the  patient based on review of the above and I have provided the patient with a written personalized care plan for preventive services.     Loreen Freud, DO   05/09/2011

## 2011-05-09 NOTE — Patient Instructions (Signed)
Preventative Care for Adults, Male   MAINTAIN REGULAR HEALTH EXAMS   A routine yearly physical is a good way to check in with your primary care provider about your health and preventive screening. It is also an opportunity to share updates about your health and any concerns you have and receive a thorough all-over exam.   If you smoke or chew tobacco, find out from your caregiver how to quit. It can literally save your life, no matter how long you have been a tobacco user. If you do not use tobacco, never begin.   Maintain a healthy diet and normal weight. Increased weight leads to problems with blood pressure and diabetes. Decrease saturated fat in the diet and increase regular exercise. Get information about proper diet from your caregiver if necessary. Eat a variety of foods, including fruit, vegetables, animal or vegetable protein, such as meat, fish, chicken, and eggs, or beans, lentils, tofu, and grains, such as rice.   High blood pressure causes heart and blood vessel problems. Fat leaves deposits in your arteries that can block them. This causes heart disease and vessel disease elsewhere in your body. Check your blood pressure regularly and keep it within normal limits. Men over age 50 or those who have a family history of high blood pressure should have it checked at least every year.   Aerobic exercise helps maintain good heart health. 30 minutes of moderate-intensity exercise is recommended. For example, a brisk walk that increases your heart rate and breathing. This walk should be done on most days of the week. Persistent high blood pressure should be treated with medicine if weight loss and exercise do not work.   For many men aged 20 and older, having a cholesterol test of the blood every 5 years is recommended. If your cholesterol is found to be borderline high, or if you have heart disease or certain other medical conditions, then you may need to have it monitored more frequently.   Avoid smoking,  drinking alcohol in excess (more than two drinks per day) or use of street drugs. Do not share needles with anyone. Ask for professional help if you need assistance or instructions on stopping the use of alcohol, cigarettes, and/or drugs.   Maintain normal blood lipids and cholesterol by minimizing your intake of saturated fat. Eat a well rounded diet otherwise, with plenty of fruit and vegetables. The National Institutes of Health encourage men to eat 5-9 servings of fruit and vegetables each day. Your caregiver can help you keep your risk of heart disease or stroke at a lower level.   Ask your caregiver if you are in need of earlier testing because of: a strong family history of heart disease, or you have signs of elevated testosterone (male sex hormone) levels. This can predispose you to earlier heart disease. Ask if you should have a stress test if your history suggests this. A stress test is a test done on a treadmill that looks for heart disease. This test can find disease prior to there being a problem.   Diabetes screening assesses your blood sugar level after a fasting once every 3 years after age 45 if previous tests were normal.   Most routine colon cancer screening begins at the age of 50. On a yearly basis, doctors may provide special easy to use take-home tests to check for hidden blood in the stool. Sigmoidoscopy or colonoscopy can detect the earliest forms of colon cancer and is life saving. These test use a   small camera at the end of a tube to directly examine the colon. Speak to your caregiver about this at age 50, when routine screening begins (and is repeated every 5 years unless early forms of pre-cancerous polyps or small growths are found).   At the age of 50 men usually start screening for prostate cancer every year. Screening may begin at a younger age for those with higher risk. Those at higher risk include African-Americans or having a family history of prostate cancer. There are two types  of tests for prostate cancer:   Prostate-specific antigen (PSA) testing. Recent studies raise questions about prostate cancer using PSA and you should discuss this with your caregiver.   Digital rectal exam (in which your doctor’s lubricated and gloved finger feels for enlargement of the prostate through the anus).   Practice safe sex. Use condoms. Condoms are used for birth control and to help reduce the spread of sexually transmitted infections (or STIs). Unsafe sex is having an unprotected physical relationship with someone who is bisexual, homosexual, uses intravenous street drugs, or going with someone who has sexual relations with high-risk groups. Practicing safe sex helps you avoid getting an STI. Some of the STIs are gonorrhea (the clap), chlamydia, syphilis, trichimonas, herpes, HPV (human papiloma virus) and HIV (human immunodeficiency virus) which causes AIDS. The herpes, HIV and HPV are viral illnesses that have no cure. These can result in disability, cancer and death.   It is not safe for someone who has AIDS or is HIV positive to have unprotected sex with someone else who is positive. The reason for this is the fact that there are many different strains of HIV. If you have a strain that is readily treated with medications and then suddenly introduce a strain from a partner that has no further treatment options, you may suddenly have a strain of HIV that is untreatable. Even if you are both positive for HIV, it is still necessary to practice safe sex.   Use sunscreen with a SPF (or skin protection factor) of 15 or greater. Apply sunscreen liberally and repeatedly throughout the day. Being outside in the sun when your shadow caused by the sun is shorter than you are, means you are being exposed to sun at greater intensity. Lighter skinned people are at a greater risk of skin cancer. Don’t forget to also wear sunglasses in order to protect your eyes from too much damaging sunlight. Damaging sunlight can  accelerate cataract formation.   Once a month do a whole body skin exam or review, using a mirror to look at your back. Notify your caregivers of changes in moles, especially if there are changes in shapes, colors, a size larger than a pencil eraser, an irregular border, or development of new moles.   Keep carbon monoxide and smoke detectors in your home functioning at all times. Change the batteries every 6 months or use a model that plugs into the wall.   Do a monthly exam of your testicles. Gently roll each testicle between your thumb and fingers, feeling for any abnormal lumps. The best time to do this is after a hot shower or bath when the tissues are looser. Notify your caregivers of any lumps, tenderness or changes in size or shape immediately.   Stay up to date with your tetanus shots and other required immunizations. You should have a booster for tetanus every 10 years. Be sure to get your flu shot every year, since 5%-20% of the [place] population   comes down with the flu. The flu vaccine changes each year, so being vaccinated once is not enough. Get your shot in the fall, before the flu season peaks. The table below lists important vaccines to get. Other vaccines to consider include:   Hepatitis A virus (to prevent a form of infection of the liver by a virus acquired from food), Varicella Zoster (a virus that causes shingles).   Meningococcal (against bacteria which cause a form of meningitis).   Brush your teeth twice a day with fluoride toothpaste, and floss once a day. Good oral hygiene prevents tooth decay and gum disease. The problems can be painful, unattractive, and can cause other health problems. Visit your dentist for a routine oral and dental check up and preventive care every 6-12 months.   The Body Mass Index or BMI is a way of measuring how much of your body is fat. Having a BMI above 27 increases the risk of heart disease, diabetes, hypertension, stroke and other problems related to obesity.  Your caregiver can help determine your BMI and based on it develop an exercise and dietary program to help you achieve or maintain this important measurement at a healthful level.   Wear seat belts whenever in a vehicle, whether a passenger or driver, and even for very short drives of a few minutes.   If you bicycle, wear a helmet at all times.   Below is a summary of the most important preventative healthcare services that adult males should seek on a regular basis throughout their lives:   Preventative Care for Adult Males    Preventative Service  Ages 19-39  Ages 40-64  Ages 65 and over    Schedule of medical visits  Every 5 years  Every 5 years     Schedule of dental visits  Every 6-12 months  Every 6-12 months     Health risk assessment and lifestyle counseling  Every 3-5 years  Every 3-5 years  Every 3-5 years    Blood pressure check**  Every 2 years  Every 2 years  Every 2 years    Total cholesterol check including HDL**  Every 5 years beginning at age 35  Every 5 years  Every 5 years through age 75, then optional.    Flexible sigmoidoscopy or colonoscopy**   Every 5 years beginning at age 50  Every 5 years through age 80, then optional.    Prostate screening   Every year beginning at age 50  Every Year    Testicular exam  Monthly  Monthly  Monthly    FOBT (fecal occult blood test)   Every year beginning at age 50  Every year until 80, then optional.    Skin self-exam  Monthly  Monthly  Monthly    Tetanus-diphtheria (Td) immunization  Every 10 years  Every 10 years  Every 10 years    Influenza immunization**  Every year  Every year  Every year    Pneumococcal immunization**  Optional  Optional  Every 5 years    Hepatitis B immunization**  Series of 3 immunizations   (if not done previously, usually given at 0, 1 to 2, and 4 to 6 months)  Check with your caregiver if vaccination not previously given.  Check with your caregiver if vaccination not previously given.    **Family history and personal history of  risk and conditions may change your physician's recommendations.    Document Released: 11/01/2001 Document Re-Released: 11/30/2009   Exit[employer]® Patient Information ©  2011 [employer].

## 2011-05-10 DIAGNOSIS — M353 Polymyalgia rheumatica: Secondary | ICD-10-CM | POA: Insufficient documentation

## 2011-05-11 ENCOUNTER — Other Ambulatory Visit: Payer: Self-pay | Admitting: *Deleted

## 2011-05-11 DIAGNOSIS — Z0000 Encounter for general adult medical examination without abnormal findings: Secondary | ICD-10-CM

## 2011-05-11 MED ORDER — ZOSTER VACCINE LIVE 19400 UNT/0.65ML ~~LOC~~ SOLR: 0.6500 mL | Freq: Once | SUBCUTANEOUS | Status: DC

## 2011-05-11 NOTE — Telephone Encounter (Signed)
Pt states that he missed placed Rx and needs another one. Rx printed  Awaiting signature.

## 2011-06-24 LAB — URINALYSIS, ROUTINE W REFLEX MICROSCOPIC
Glucose, UA: NEGATIVE mg/dL
Hgb urine dipstick: NEGATIVE
Ketones, ur: NEGATIVE mg/dL
Leukocytes, UA: NEGATIVE
Nitrite: NEGATIVE
Specific Gravity, Urine: 1.019 (ref 1.005–1.030)
Urobilinogen, UA: 1 mg/dL (ref 0.0–1.0)
pH: 5 (ref 5.0–8.0)

## 2011-06-24 LAB — COMPREHENSIVE METABOLIC PANEL
ALT: 20 U/L (ref 0–53)
AST: 20 U/L (ref 0–37)
Alkaline Phosphatase: 72 U/L (ref 39–117)
CO2: 28 mEq/L (ref 19–32)
Chloride: 105 mEq/L (ref 96–112)
GFR calc Af Amer: >60 mL/min (ref >60)
GFR calc non Af Amer: >60 mL/min (ref >60)
Sodium: 142 mEq/L (ref 135–145)
Total Bilirubin: 1.1 mg/dL (ref 0.3–1.2)

## 2011-06-24 LAB — PROTIME-INR
INR: 1.2 (ref 0.00–1.49)
INR: 2.6 — ABNORMAL HIGH (ref 0.00–1.49)
Prothrombin Time: 13.2 seconds (ref 11.6–15.2)
Prothrombin Time: 15.6 seconds — ABNORMAL HIGH (ref 11.6–15.2)
Prothrombin Time: 23.6 seconds — ABNORMAL HIGH (ref 11.6–15.2)
Prothrombin Time: 27.3 seconds — ABNORMAL HIGH (ref 11.6–15.2)

## 2011-06-24 LAB — URINE CULTURE
Special Requests: NEGATIVE
Special Requests: NEGATIVE

## 2011-06-24 LAB — HEMOGLOBIN AND HEMATOCRIT, BLOOD
HCT: 37.4 % — ABNORMAL LOW (ref 39.0–52.0)
Hemoglobin: 11.7 g/dL — ABNORMAL LOW (ref 13.0–17.0)
Hemoglobin: 12.5 g/dL — ABNORMAL LOW (ref 13.0–17.0)

## 2011-06-24 LAB — CBC
Hemoglobin: 14.9 g/dL (ref 13.0–17.0)
RBC: 4.61 MIL/uL (ref 4.22–5.81)
WBC: 5.8 10*3/uL (ref 4.0–10.5)

## 2011-06-24 LAB — DIFFERENTIAL
Basophils Absolute: 0 10*3/uL (ref 0.0–0.1)
Basophils Relative: 1 % (ref 0–1)
Eosinophils Absolute: 0.1 10*3/uL (ref 0.0–0.7)
Eosinophils Relative: 2 % (ref 0–5)

## 2011-06-24 LAB — URINE MICROSCOPIC-ADD ON

## 2011-06-24 LAB — TYPE AND SCREEN: ABO/RH(D): O POS

## 2011-07-08 ENCOUNTER — Other Ambulatory Visit: Payer: Self-pay | Admitting: Dermatology

## 2011-07-13 ENCOUNTER — Ambulatory Visit (INDEPENDENT_AMBULATORY_CARE_PROVIDER_SITE_OTHER): Payer: Medicare Other

## 2011-07-13 DIAGNOSIS — Z23 Encounter for immunization: Secondary | ICD-10-CM

## 2011-08-02 ENCOUNTER — Other Ambulatory Visit: Payer: Self-pay | Admitting: Family Medicine

## 2011-08-02 NOTE — Telephone Encounter (Signed)
rx sent to pharmacy by e-script crestor

## 2011-11-23 DIAGNOSIS — E042 Nontoxic multinodular goiter: Secondary | ICD-10-CM | POA: Diagnosis not present

## 2011-11-24 DIAGNOSIS — M810 Age-related osteoporosis without current pathological fracture: Secondary | ICD-10-CM | POA: Diagnosis not present

## 2011-11-24 DIAGNOSIS — M353 Polymyalgia rheumatica: Secondary | ICD-10-CM | POA: Diagnosis not present

## 2011-11-24 DIAGNOSIS — M159 Polyosteoarthritis, unspecified: Secondary | ICD-10-CM | POA: Diagnosis not present

## 2011-11-24 DIAGNOSIS — E042 Nontoxic multinodular goiter: Secondary | ICD-10-CM | POA: Diagnosis not present

## 2012-01-09 DIAGNOSIS — H2510 Age-related nuclear cataract, unspecified eye: Secondary | ICD-10-CM | POA: Diagnosis not present

## 2012-01-09 DIAGNOSIS — H40009 Preglaucoma, unspecified, unspecified eye: Secondary | ICD-10-CM | POA: Diagnosis not present

## 2012-01-09 DIAGNOSIS — H524 Presbyopia: Secondary | ICD-10-CM | POA: Diagnosis not present

## 2012-02-07 DIAGNOSIS — M76899 Other specified enthesopathies of unspecified lower limb, excluding foot: Secondary | ICD-10-CM | POA: Diagnosis not present

## 2012-02-23 ENCOUNTER — Other Ambulatory Visit: Payer: Self-pay | Admitting: Family Medicine

## 2012-02-27 DIAGNOSIS — M353 Polymyalgia rheumatica: Secondary | ICD-10-CM | POA: Diagnosis not present

## 2012-02-27 DIAGNOSIS — M810 Age-related osteoporosis without current pathological fracture: Secondary | ICD-10-CM | POA: Diagnosis not present

## 2012-02-27 DIAGNOSIS — M159 Polyosteoarthritis, unspecified: Secondary | ICD-10-CM | POA: Diagnosis not present

## 2012-03-13 DIAGNOSIS — D239 Other benign neoplasm of skin, unspecified: Secondary | ICD-10-CM | POA: Diagnosis not present

## 2012-03-13 DIAGNOSIS — L570 Actinic keratosis: Secondary | ICD-10-CM | POA: Diagnosis not present

## 2012-03-13 DIAGNOSIS — Z85828 Personal history of other malignant neoplasm of skin: Secondary | ICD-10-CM | POA: Diagnosis not present

## 2012-03-13 DIAGNOSIS — C4359 Malignant melanoma of other part of trunk: Secondary | ICD-10-CM | POA: Diagnosis not present

## 2012-03-24 ENCOUNTER — Other Ambulatory Visit: Payer: Self-pay | Admitting: Family Medicine

## 2012-04-04 DIAGNOSIS — M353 Polymyalgia rheumatica: Secondary | ICD-10-CM | POA: Diagnosis not present

## 2012-04-04 DIAGNOSIS — M159 Polyosteoarthritis, unspecified: Secondary | ICD-10-CM | POA: Diagnosis not present

## 2012-04-04 DIAGNOSIS — M25559 Pain in unspecified hip: Secondary | ICD-10-CM | POA: Diagnosis not present

## 2012-04-04 DIAGNOSIS — M810 Age-related osteoporosis without current pathological fracture: Secondary | ICD-10-CM | POA: Diagnosis not present

## 2012-04-20 DIAGNOSIS — M76899 Other specified enthesopathies of unspecified lower limb, excluding foot: Secondary | ICD-10-CM | POA: Diagnosis not present

## 2012-04-25 DIAGNOSIS — M76899 Other specified enthesopathies of unspecified lower limb, excluding foot: Secondary | ICD-10-CM | POA: Diagnosis not present

## 2012-04-25 DIAGNOSIS — M25559 Pain in unspecified hip: Secondary | ICD-10-CM | POA: Diagnosis not present

## 2012-05-01 DIAGNOSIS — M25559 Pain in unspecified hip: Secondary | ICD-10-CM | POA: Diagnosis not present

## 2012-05-07 DIAGNOSIS — M25559 Pain in unspecified hip: Secondary | ICD-10-CM | POA: Diagnosis not present

## 2012-05-24 ENCOUNTER — Encounter: Payer: Self-pay | Admitting: Family Medicine

## 2012-05-24 ENCOUNTER — Ambulatory Visit (INDEPENDENT_AMBULATORY_CARE_PROVIDER_SITE_OTHER): Payer: Medicare Other | Admitting: Family Medicine

## 2012-05-24 VITALS — BP 138/84 | HR 103 | Temp 98.2°F | Ht 72.0 in | Wt 240.6 lb

## 2012-05-24 DIAGNOSIS — Z01818 Encounter for other preprocedural examination: Secondary | ICD-10-CM | POA: Diagnosis not present

## 2012-05-24 DIAGNOSIS — I10 Essential (primary) hypertension: Secondary | ICD-10-CM

## 2012-05-24 DIAGNOSIS — Z8546 Personal history of malignant neoplasm of prostate: Secondary | ICD-10-CM | POA: Diagnosis not present

## 2012-05-24 DIAGNOSIS — M353 Polymyalgia rheumatica: Secondary | ICD-10-CM

## 2012-05-24 DIAGNOSIS — E785 Hyperlipidemia, unspecified: Secondary | ICD-10-CM | POA: Diagnosis not present

## 2012-05-24 LAB — HEPATIC FUNCTION PANEL
ALT: 19 U/L (ref 0–53)
AST: 19 U/L (ref 0–37)
Albumin: 3.8 g/dL (ref 3.5–5.2)

## 2012-05-24 LAB — CBC WITH DIFFERENTIAL/PLATELET
Eosinophils Relative: 1.6 % (ref 0.0–5.0)
HCT: 43.2 % (ref 39.0–52.0)
Hemoglobin: 14.3 g/dL (ref 13.0–17.0)
Lymphs Abs: 1.1 10*3/uL (ref 0.7–4.0)
Monocytes Relative: 8.5 % (ref 3.0–12.0)
Neutro Abs: 3.5 10*3/uL (ref 1.4–7.7)
RBC: 4.41 Mil/uL (ref 4.22–5.81)
WBC: 5.1 10*3/uL (ref 4.5–10.5)

## 2012-05-24 LAB — BASIC METABOLIC PANEL
GFR: 59.32 mL/min — ABNORMAL LOW (ref >60.00)
Potassium: 3.7 mEq/L (ref 3.5–5.1)
Sodium: 141 mEq/L (ref 135–145)

## 2012-05-24 LAB — POCT URINALYSIS DIPSTICK
Glucose, UA: NEGATIVE
Ketones, UA: NEGATIVE
Leukocytes, UA: NEGATIVE
Spec Grav, UA: >=1.03
Urobilinogen, UA: 0.2

## 2012-05-24 LAB — LIPID PANEL
Cholesterol: 99 mg/dL (ref 0–200)
LDL Cholesterol: 39 mg/dL (ref 0–99)

## 2012-05-24 LAB — PSA: PSA: 0.17 ng/mL (ref 0.10–4.00)

## 2012-05-24 MED ORDER — LISINOPRIL-HYDROCHLOROTHIAZIDE 10-12.5 MG PO TABS: ORAL_TABLET | ORAL | Status: DC

## 2012-05-24 MED ORDER — ROSUVASTATIN CALCIUM 20 MG PO TABS: 20.0000 mg | ORAL_TABLET | Freq: Every day | ORAL | Status: DC

## 2012-05-24 NOTE — Patient Instructions (Addendum)
You are cleared for your hip surgery We will see you for f/u after you recover

## 2012-05-24 NOTE — Assessment & Plan Note (Signed)
Check labs today con't meds 

## 2012-05-24 NOTE — Assessment & Plan Note (Signed)
Stable con't meds 

## 2012-05-24 NOTE — Assessment & Plan Note (Signed)
Pt doing well F/u per rheum

## 2012-05-24 NOTE — Progress Notes (Signed)
Subjective:    Andrew Ramos is a 78 y.o. male who presents to the office today for a preoperative consultation at the request of surgeon Dr Darrelyn Hillock who plans on performing total R hip replacement for stress fracture on tba -. This consultation is requested for the specific conditions prompting preoperative evaluation (i.e. because of potential affect on operative risk): htn, hyperlipidemia, PMR and advanced age. Planned anesthesia: general. The patient has the following known anesthesia issues: none. Patients bleeding risk: no recent abnormal bleeding. Patient does not have objections to receiving blood products if needed.  The following portions of the patient's history were reviewed and updated as appropriate: He  has a past medical history of Hypertension; Hyperlipidemia; Prostate cancer; Osteoarthritis; Colon polyps; and Thyroid disease. He  does not have any pertinent problems on file. He  has past surgical history that includes Tonsillectomy; Total knee arthroplasty (12/31/2008); and Total hip arthroplasty (08/27/2008). His family history includes Cancer in his brother and father; Coronary artery disease (age of onset:54) in his mother; Coronary artery disease (age of onset:63) in his father; Heart disease (age of onset:54) in his mother; Pancreatic cancer in his father; and Pancreatic cancer (age of onset:54) in his brother. He  reports that he has never smoked. He has never used smokeless tobacco. He reports that he drinks alcohol. He reports that he does not use illicit drugs. He has a current medication list which includes the following prescription(s): alendronate, aspirin, calcium carbonate, cholecalciferol, crestor, lisinopril-hydrochlorothiazide, centrum silver ultra mens, prednisone, and terazosin. He  has no known allergies..  Review of Systems Review of Systems  Constitutional: Negative for activity change, appetite change and fatigue.  HENT: Negative for hearing loss,  congestion, tinnitus and ear discharge.  dentist q36m Eyes: Negative for visual disturbance (see optho q1y -- vision corrected to 20/20 with glasses).  Respiratory: Negative for cough, chest tightness and shortness of breath.   Cardiovascular: Negative for chest pain, palpitations and leg swelling.  Gastrointestinal: Negative for abdominal pain, diarrhea, constipation and abdominal distention.  Genitourinary: Negative for urgency, frequency, decreased urine volume and difficulty urinating.  Musculoskeletal: Negative for back pain,  R hip pin Skin: Negative for color change, pallor and rash.  Neurological: Negative for dizziness, light-headedness, numbness and headaches.  Hematological: Negative for adenopathy. Does not bruise/bleed easily.  Psychiatric/Behavioral: Negative for suicidal ideas, confusion, sleep disturbance, self-injury, dysphoric mood, decreased concentration and agitation.        Objective:    BP 138/84  Pulse 103  Temp 98.2 F (36.8 C) (Oral)  Ht 6' (1.829 m)  Wt 240 lb 9.6 oz (109.135 kg)  BMI 32.63 kg/m2  SpO2 97% General appearance: alert, cooperative, appears stated age and no distress Head: Normocephalic, without obvious abnormality, atraumatic Eyes: negative findings: lids and lashes normal and pupils equal, round, reactive to light and accomodation Ears: normal TM's and external ear canals both ears Nose: Nares normal. Septum midline. Mucosa normal. No drainage or sinus tenderness. Throat: lips, mucosa, and tongue normal; teeth and gums normal Neck: no adenopathy, no carotid bruit, no JVD, supple, symmetrical, trachea midline and thyroid not enlarged, symmetric, no tenderness/mass/nodules Back: negative Lungs: clear to auscultation bilaterally Chest wall: no tenderness Heart: regular rate and rhythm, S1, S2 normal, no murmur, click, rub or gallop Abdomen: soft, non-tender; bowel sounds normal; no masses,  no organomegaly Extremities: edema trace pitting  edema b/l Pulses: 2+ and symmetric Lymph nodes: Cervical, supraclavicular, and axillary nodes normal. Neurologic:  Pain with walking secondary to  R hip pain--walking with cane Predictors of intubation difficulty:  Morbid obesity? no  Anatomically abnormal facies? no  Prominent incisors? no  Receding mandible? no  Short, thick neck? no  Neck range of motion: normal       Dentition: No chipped, loose, or missing teeth.  Cardiographics ECG: normal sinus rhythm, no blocks or conduction defects, no ischemic changes Echocardiogram: not done  Imaging Chest x-ray: not done   Lab Review --labs drawn today No visits with results within 2 Month(s) from this visit. Latest known visit with results is:  Office Visit on 05/09/2011  Component Date Value  . Sodium 05/09/2011 142   . Potassium 05/09/2011 4.9   . Chloride 05/09/2011 103   . CO2 05/09/2011 29   . Glucose, Bld 05/09/2011 107*  . BUN 05/09/2011 20   . Creatinine, Ser 05/09/2011 1.2   . Calcium 05/09/2011 9.6   . GFR 05/09/2011 61.18   . WBC 05/09/2011 5.2   . RBC 05/09/2011 4.71   . Hemoglobin 05/09/2011 15.4   . HCT 05/09/2011 45.3   . MCV 05/09/2011 96.3   . MCHC 05/09/2011 33.9   . RDW 05/09/2011 13.3   . Platelets 05/09/2011 181.0   . Neutrophils Relative 05/09/2011 63.6   . Lymphocytes Relative 05/09/2011 24.4   . Monocytes Relative 05/09/2011 8.8   . Eosinophils Relative 05/09/2011 2.7   . Basophils Relative 05/09/2011 0.5   . Neutro Abs 05/09/2011 3.3   . Lymphs Abs 05/09/2011 1.3   . Monocytes Absolute 05/09/2011 0.5   . Eosinophils Absolute 05/09/2011 0.1   . Basophils Absolute 05/09/2011 0.0   . Total Bilirubin 05/09/2011 1.3*  . Bilirubin, Direct 05/09/2011 0.2   . Alkaline Phosphatase 05/09/2011 51   . AST 05/09/2011 28   . ALT 05/09/2011 18   . Total Protein 05/09/2011 6.8   . Albumin 05/09/2011 4.5   . Cholesterol 05/09/2011 118   . Triglycerides 05/09/2011 117.0   . HDL 05/09/2011 43.50   .  VLDL 05/09/2011 23.4   . LDL Cholesterol 05/09/2011 51   . Total CHOL/HDL Ratio 05/09/2011 3   . Color, UA 05/09/2011 yellow   . Clarity, UA 05/09/2011 clear   . Glucose, UA 05/09/2011 neg   . Bilirubin, UA 05/09/2011 neg   . Ketones, UA 05/09/2011 neg   . Spec Grav, UA 05/09/2011 1.005   . Blood, UA 05/09/2011 neg   . pH, UA 05/09/2011 6.0   . Protein, UA 05/09/2011 neg   . Urobilinogen, UA 05/09/2011 0.2   . Nitrite, UA 05/09/2011 neg   . Leukocytes, UA 05/09/2011 neg   . PSA 05/09/2011 0.18       Assessment:      77 y.o. male with planned surgery as above.   Known risk factors for perioperative complications: None   Difficulty with intubation is not anticipated.    Current medications which may produce withdrawal symptoms if withheld perioperatively: na    Plan:    1. Preoperative workup as follows ECG, labs. 2. Change in medication regimen before surgery: none, continue medication regimen including morning of surgery, with sip of water.--- stop aspirin per surgery recommendation 3. Prophylaxis for cardiac events with perioperative beta-blockers: per anesthesia. 4. Invasive hemodynamic monitoring perioperatively: at the discretion of anesthesiologist. 5. Deep vein thrombosis prophylaxis postoperatively:regimen to be chosen by surgical team. 6. Surveillance for postoperative MI with ECG immediately postoperatively and on postoperative days 1 and 2 AND troponin levels 24 hours postoperatively and on day 4  or hospital discharge (whichever comes first): at the discretion of anesthesiologist. 7. Other measures: triad hospitalist available for consult if needed

## 2012-05-29 ENCOUNTER — Telehealth: Payer: Self-pay | Admitting: Family Medicine

## 2012-05-29 NOTE — Telephone Encounter (Signed)
Caller: Andrew Ramos/Patient; Phone: [phone]; Reason for Call: Patient needs to have a release form sent to [facility] so that patient can have hip surgery.  Please call back.

## 2012-05-29 NOTE — Telephone Encounter (Signed)
Forms completed and faxed      Kp

## 2012-05-29 NOTE — Telephone Encounter (Signed)
I thought this was done the day he was here----ov should have been sent.

## 2012-06-05 DIAGNOSIS — M169 Osteoarthritis of hip, unspecified: Secondary | ICD-10-CM | POA: Diagnosis not present

## 2012-06-08 ENCOUNTER — Encounter (HOSPITAL_COMMUNITY): Payer: Self-pay | Admitting: Pharmacy Technician

## 2012-06-11 ENCOUNTER — Encounter (HOSPITAL_COMMUNITY)
Admission: RE | Admit: 2012-06-11 | Discharge: 2012-06-11 | Disposition: A | Discharge status: Home / Self Care | Payer: Medicare Other | Source: Ambulatory Visit | Attending: Orthopedic Surgery | Admitting: Orthopedic Surgery

## 2012-06-11 ENCOUNTER — Encounter (HOSPITAL_COMMUNITY): Payer: Self-pay

## 2012-06-11 ENCOUNTER — Ambulatory Visit (HOSPITAL_COMMUNITY)
Admission: RE | Admit: 2012-06-11 | Discharge: 2012-06-11 | Disposition: A | Discharge status: Home / Self Care | Payer: Medicare Other | Source: Ambulatory Visit | Attending: Orthopedic Surgery | Admitting: Orthopedic Surgery

## 2012-06-11 DIAGNOSIS — Z01812 Encounter for preprocedural laboratory examination: Secondary | ICD-10-CM | POA: Insufficient documentation

## 2012-06-11 DIAGNOSIS — M169 Osteoarthritis of hip, unspecified: Secondary | ICD-10-CM | POA: Diagnosis not present

## 2012-06-11 DIAGNOSIS — M1610 Unilateral primary osteoarthritis, unspecified hip: Secondary | ICD-10-CM | POA: Insufficient documentation

## 2012-06-11 DIAGNOSIS — I10 Essential (primary) hypertension: Secondary | ICD-10-CM | POA: Diagnosis not present

## 2012-06-11 DIAGNOSIS — Z01818 Encounter for other preprocedural examination: Secondary | ICD-10-CM | POA: Diagnosis not present

## 2012-06-11 HISTORY — DX: Inflammatory liver disease, unspecified: K75.9

## 2012-06-11 HISTORY — DX: Polymyalgia rheumatica: M35.3

## 2012-06-11 LAB — APTT: aPTT: 31 seconds (ref 24–37)

## 2012-06-11 LAB — SURGICAL PCR SCREEN: MRSA, PCR: NEGATIVE

## 2012-06-11 LAB — TYPE AND SCREEN: ABO/RH(D): O POS

## 2012-06-11 LAB — CBC
HCT: 41.3 % (ref 39.0–52.0)
MCV: 93.9 fL (ref 78.0–100.0)
RDW: 13.2 % (ref 11.5–15.5)
WBC: 6 10*3/uL (ref 4.0–10.5)

## 2012-06-11 LAB — URINALYSIS, ROUTINE W REFLEX MICROSCOPIC
Glucose, UA: NEGATIVE mg/dL
Leukocytes, UA: NEGATIVE
Nitrite: NEGATIVE
Protein, ur: NEGATIVE mg/dL
Urobilinogen, UA: 0.2 mg/dL (ref 0.0–1.0)

## 2012-06-11 LAB — BASIC METABOLIC PANEL
BUN: 20 mg/dL (ref 6–23)
CO2: 26 mEq/L (ref 19–32)
Chloride: 102 mEq/L (ref 96–112)
Creatinine, Ser: 1.06 mg/dL (ref 0.50–1.35)

## 2012-06-11 NOTE — Progress Notes (Signed)
05-24-12 ekg epic Dr Laury Axon medical clearance note on chart

## 2012-06-11 NOTE — Patient Instructions (Addendum)
20 Andrew Ramos  06/11/2012   Your procedure is scheduled on:  06-12-2012  Report to [facility] at 115 PM  Call this number if you have problems the morning of surgery: [phone]   Remember:   Do not eat food :After Midnight.   clear liquids midnight until 0945 am day of surgery, then nothing by mouth.  Take these medicines the morning of surgery with A SIP OF WATER: prednisone   Do not wear jewelry or make up.  Do not wear lotions, powders, or perfumes.Do not wear deodorant.    Do not bring valuables to the hospital.  Contacts, dentures or bridgework may not be worn into surgery.  Leave suitcase in the car. After surgery it may be brought to your room.  For patients admitted to the hospital, checkout time is 11:00 AM the day of discharge                             Patients discharged the day of surgery will not be allowed to drive home. If going home same day of surgery, you must have someone stay with you the first 24 hours at home and arrange for some one to drive you home from hospital.    Special Instructions: See [facility] Preparing for Surgery instruction sheet. Women do not shave legs or underarms for 12 hours before showers. Men may shave face morning of surgery.    Please read over the following fact sheets that you were given: MRSA Information, blood fact sheet incentive spirometer fact sheet  [facility] pre op nurse phone number [phone], call if needed

## 2012-06-12 ENCOUNTER — Inpatient Hospital Stay (HOSPITAL_COMMUNITY): Payer: Medicare Other

## 2012-06-12 ENCOUNTER — Inpatient Hospital Stay (HOSPITAL_COMMUNITY): Payer: Medicare Other | Admitting: Anesthesiology

## 2012-06-12 ENCOUNTER — Inpatient Hospital Stay (HOSPITAL_COMMUNITY)
Admission: RE | Admit: 2012-06-12 | Discharge: 2012-06-14 | DRG: 470 | Disposition: A | Discharge status: Home Health | Payer: Medicare Other | Source: Ambulatory Visit | Attending: Orthopedic Surgery | Admitting: Orthopedic Surgery

## 2012-06-12 ENCOUNTER — Encounter (HOSPITAL_COMMUNITY)
Admission: RE | Disposition: A | Discharge status: Home Health | Payer: Self-pay | Source: Ambulatory Visit | Attending: Orthopedic Surgery

## 2012-06-12 ENCOUNTER — Encounter (HOSPITAL_COMMUNITY): Payer: Self-pay | Admitting: *Deleted

## 2012-06-12 ENCOUNTER — Encounter (HOSPITAL_COMMUNITY): Payer: Self-pay | Admitting: Anesthesiology

## 2012-06-12 DIAGNOSIS — I10 Essential (primary) hypertension: Secondary | ICD-10-CM | POA: Diagnosis present

## 2012-06-12 DIAGNOSIS — M169 Osteoarthritis of hip, unspecified: Principal | ICD-10-CM | POA: Diagnosis present

## 2012-06-12 DIAGNOSIS — M810 Age-related osteoporosis without current pathological fracture: Secondary | ICD-10-CM | POA: Diagnosis present

## 2012-06-12 DIAGNOSIS — E669 Obesity, unspecified: Secondary | ICD-10-CM | POA: Diagnosis present

## 2012-06-12 DIAGNOSIS — Z8619 Personal history of other infectious and parasitic diseases: Secondary | ICD-10-CM

## 2012-06-12 DIAGNOSIS — Z96649 Presence of unspecified artificial hip joint: Secondary | ICD-10-CM | POA: Diagnosis not present

## 2012-06-12 DIAGNOSIS — Z7982 Long term (current) use of aspirin: Secondary | ICD-10-CM

## 2012-06-12 DIAGNOSIS — M353 Polymyalgia rheumatica: Secondary | ICD-10-CM | POA: Diagnosis present

## 2012-06-12 DIAGNOSIS — Z6832 Body mass index (BMI) 32.0-32.9, adult: Secondary | ICD-10-CM

## 2012-06-12 DIAGNOSIS — Z79899 Other long term (current) drug therapy: Secondary | ICD-10-CM

## 2012-06-12 DIAGNOSIS — Z01818 Encounter for other preprocedural examination: Secondary | ICD-10-CM

## 2012-06-12 DIAGNOSIS — Z9089 Acquired absence of other organs: Secondary | ICD-10-CM

## 2012-06-12 DIAGNOSIS — E785 Hyperlipidemia, unspecified: Secondary | ICD-10-CM | POA: Diagnosis present

## 2012-06-12 DIAGNOSIS — Z86718 Personal history of other venous thrombosis and embolism: Secondary | ICD-10-CM | POA: Diagnosis not present

## 2012-06-12 DIAGNOSIS — Z8249 Family history of ischemic heart disease and other diseases of the circulatory system: Secondary | ICD-10-CM | POA: Diagnosis not present

## 2012-06-12 DIAGNOSIS — M1610 Unilateral primary osteoarthritis, unspecified hip: Principal | ICD-10-CM | POA: Diagnosis present

## 2012-06-12 DIAGNOSIS — Z8546 Personal history of malignant neoplasm of prostate: Secondary | ICD-10-CM | POA: Diagnosis not present

## 2012-06-12 DIAGNOSIS — Z01812 Encounter for preprocedural laboratory examination: Secondary | ICD-10-CM

## 2012-06-12 DIAGNOSIS — D62 Acute posthemorrhagic anemia: Secondary | ICD-10-CM | POA: Diagnosis not present

## 2012-06-12 DIAGNOSIS — IMO0002 Reserved for concepts with insufficient information to code with codable children: Secondary | ICD-10-CM

## 2012-06-12 DIAGNOSIS — Z96659 Presence of unspecified artificial knee joint: Secondary | ICD-10-CM | POA: Diagnosis not present

## 2012-06-12 DIAGNOSIS — Z471 Aftercare following joint replacement surgery: Secondary | ICD-10-CM | POA: Diagnosis not present

## 2012-06-12 HISTORY — PX: TOTAL HIP ARTHROPLASTY: SHX124

## 2012-06-12 SURGERY — ARTHROPLASTY, HIP, TOTAL, ANTERIOR APPROACH
Anesthesia: General | Site: Hip | Laterality: Right | Wound class: Clean

## 2012-06-12 MED ORDER — SUCCINYLCHOLINE CHLORIDE 20 MG/ML IJ SOLN
INTRAMUSCULAR | Status: DC | PRN
  Administered 2012-06-12: 100 mg via INTRAVENOUS

## 2012-06-12 MED ORDER — LIDOCAINE HCL 4 % MT SOLN
OROMUCOSAL | Status: DC | PRN
  Administered 2012-06-12: 4 mL via TOPICAL

## 2012-06-12 MED ORDER — METHOCARBAMOL 100 MG/ML IJ SOLN
500.0000 mg | Freq: Four times a day (QID) | INTRAVENOUS | Status: DC | PRN
  Filled 2012-06-12: qty 5

## 2012-06-12 MED ORDER — METOCLOPRAMIDE HCL 10 MG PO TABS: 5.0000 mg | ORAL_TABLET | Freq: Three times a day (TID) | ORAL | Status: DC | PRN

## 2012-06-12 MED ORDER — PROMETHAZINE HCL 25 MG/ML IJ SOLN: 6.2500 mg | INTRAMUSCULAR | Status: DC | PRN

## 2012-06-12 MED ORDER — METHOCARBAMOL 500 MG PO TABS: 500.0000 mg | ORAL_TABLET | Freq: Four times a day (QID) | ORAL | Status: DC | PRN

## 2012-06-12 MED ORDER — TERAZOSIN HCL 1 MG PO CAPS
1.0000 mg | ORAL_CAPSULE | Freq: Every day | ORAL | Status: DC
  Administered 2012-06-12 – 2012-06-13 (×2): 1 mg via ORAL
  Filled 2012-06-12 (×3): qty 1

## 2012-06-12 MED ORDER — ACETAMINOPHEN 10 MG/ML IV SOLN
INTRAVENOUS | Status: DC | PRN
  Administered 2012-06-12: 1000 mg via INTRAVENOUS

## 2012-06-12 MED ORDER — SODIUM CHLORIDE 0.9 % IV SOLN
100.0000 mL/h | INTRAVENOUS | Status: DC
  Administered 2012-06-12 – 2012-06-13 (×2): 100 mL/h via INTRAVENOUS
  Filled 2012-06-12 (×6): qty 1000

## 2012-06-12 MED ORDER — FLEET ENEMA 7-19 GM/118ML RE ENEM: 1.0000 | ENEMA | Freq: Once | RECTAL | Status: AC | PRN

## 2012-06-12 MED ORDER — ONDANSETRON HCL 4 MG/2ML IJ SOLN: 4.0000 mg | Freq: Four times a day (QID) | INTRAMUSCULAR | Status: DC | PRN

## 2012-06-12 MED ORDER — ZOLPIDEM TARTRATE 5 MG PO TABS: 5.0000 mg | ORAL_TABLET | Freq: Every evening | ORAL | Status: DC | PRN

## 2012-06-12 MED ORDER — RIVAROXABAN 10 MG PO TABS
10.0000 mg | ORAL_TABLET | ORAL | Status: DC
  Administered 2012-06-13 – 2012-06-14 (×2): 10 mg via ORAL
  Filled 2012-06-12 (×3): qty 1

## 2012-06-12 MED ORDER — PHENOL 1.4 % MT LIQD: 1.0000 | OROMUCOSAL | Status: DC | PRN

## 2012-06-12 MED ORDER — CEFAZOLIN SODIUM-DEXTROSE 2-3 GM-% IV SOLR
INTRAVENOUS | Status: AC
  Filled 2012-06-12: qty 50

## 2012-06-12 MED ORDER — MENTHOL 3 MG MT LOZG: 1.0000 | LOZENGE | OROMUCOSAL | Status: DC | PRN

## 2012-06-12 MED ORDER — LACTATED RINGERS IV SOLN
INTRAVENOUS | Status: DC
  Administered 2012-06-12: 17:00:00 via INTRAVENOUS
  Administered 2012-06-12: 1000 mL via INTRAVENOUS

## 2012-06-12 MED ORDER — PROPOFOL 10 MG/ML IV BOLUS
INTRAVENOUS | Status: DC | PRN
  Administered 2012-06-12: 160 mg via INTRAVENOUS

## 2012-06-12 MED ORDER — ONDANSETRON HCL 4 MG/2ML IJ SOLN
INTRAMUSCULAR | Status: DC | PRN
  Administered 2012-06-12: 4 mg via INTRAVENOUS

## 2012-06-12 MED ORDER — CEFAZOLIN SODIUM-DEXTROSE 2-3 GM-% IV SOLR
2.0000 g | Freq: Four times a day (QID) | INTRAVENOUS | Status: AC
  Administered 2012-06-13 (×2): 2 g via INTRAVENOUS
  Filled 2012-06-12 (×2): qty 50

## 2012-06-12 MED ORDER — PREDNISONE 1 MG PO TABS
1.0000 mg | ORAL_TABLET | Freq: Every morning | ORAL | Status: DC
  Administered 2012-06-13 – 2012-06-14 (×2): 1 mg via ORAL
  Filled 2012-06-12 (×2): qty 1

## 2012-06-12 MED ORDER — DIPHENHYDRAMINE HCL 25 MG PO CAPS: 25.0000 mg | ORAL_CAPSULE | Freq: Four times a day (QID) | ORAL | Status: DC | PRN

## 2012-06-12 MED ORDER — FENTANYL CITRATE 0.05 MG/ML IJ SOLN
INTRAMUSCULAR | Status: DC | PRN
  Administered 2012-06-12: 50 ug via INTRAVENOUS
  Administered 2012-06-12: 25 ug via INTRAVENOUS
  Administered 2012-06-12: 50 ug via INTRAVENOUS
  Administered 2012-06-12: 25 ug via INTRAVENOUS
  Administered 2012-06-12: 100 ug via INTRAVENOUS
  Administered 2012-06-12: 50 ug via INTRAVENOUS
  Administered 2012-06-12: 100 ug via INTRAVENOUS
  Administered 2012-06-12: 50 ug via INTRAVENOUS

## 2012-06-12 MED ORDER — CEFAZOLIN SODIUM-DEXTROSE 2-3 GM-% IV SOLR
2.0000 g | INTRAVENOUS | Status: AC
  Administered 2012-06-12: 2 g via INTRAVENOUS

## 2012-06-12 MED ORDER — HYDROMORPHONE HCL PF 1 MG/ML IJ SOLN
0.2500 mg | INTRAMUSCULAR | Status: DC | PRN
Start: 2012-06-12 — End: 2012-06-12

## 2012-06-12 MED ORDER — DOCUSATE SODIUM 100 MG PO CAPS
100.0000 mg | ORAL_CAPSULE | Freq: Two times a day (BID) | ORAL | Status: DC
  Administered 2012-06-12 – 2012-06-14 (×4): 100 mg via ORAL

## 2012-06-12 MED ORDER — ROCURONIUM BROMIDE 100 MG/10ML IV SOLN
INTRAVENOUS | Status: DC | PRN
  Administered 2012-06-12: 5 mg via INTRAVENOUS

## 2012-06-12 MED ORDER — HYDROCODONE-ACETAMINOPHEN 7.5-325 MG PO TABS
1.0000 | ORAL_TABLET | ORAL | Status: DC
  Administered 2012-06-13: 1 via ORAL
  Filled 2012-06-12 (×2): qty 1
  Filled 2012-06-12: qty 2

## 2012-06-12 MED ORDER — ALUM & MAG HYDROXIDE-SIMETH 200-200-20 MG/5ML PO SUSP: 30.0000 mL | ORAL | Status: DC | PRN

## 2012-06-12 MED ORDER — ONDANSETRON HCL 4 MG PO TABS: 4.0000 mg | ORAL_TABLET | Freq: Four times a day (QID) | ORAL | Status: DC | PRN

## 2012-06-12 MED ORDER — ATORVASTATIN CALCIUM 40 MG PO TABS
40.0000 mg | ORAL_TABLET | Freq: Every day | ORAL | Status: DC
  Administered 2012-06-13: 40 mg via ORAL
  Filled 2012-06-12 (×2): qty 1

## 2012-06-12 MED ORDER — DEXAMETHASONE SODIUM PHOSPHATE 10 MG/ML IJ SOLN
INTRAMUSCULAR | Status: DC | PRN
  Administered 2012-06-12: 10 mg via INTRAVENOUS

## 2012-06-12 MED ORDER — POLYETHYLENE GLYCOL 3350 17 G PO PACK
17.0000 g | PACK | Freq: Two times a day (BID) | ORAL | Status: DC
  Administered 2012-06-12 – 2012-06-14 (×3): 17 g via ORAL

## 2012-06-12 MED ORDER — METOCLOPRAMIDE HCL 5 MG/ML IJ SOLN: 5.0000 mg | Freq: Three times a day (TID) | INTRAMUSCULAR | Status: DC | PRN

## 2012-06-12 MED ORDER — ACETAMINOPHEN 10 MG/ML IV SOLN
INTRAVENOUS | Status: AC
  Filled 2012-06-12: qty 100

## 2012-06-12 MED ORDER — CELECOXIB 200 MG PO CAPS
200.0000 mg | ORAL_CAPSULE | Freq: Two times a day (BID) | ORAL | Status: DC
  Administered 2012-06-12 – 2012-06-14 (×4): 200 mg via ORAL
  Filled 2012-06-12 (×5): qty 1

## 2012-06-12 MED ORDER — BISACODYL 10 MG RE SUPP: 10.0000 mg | Freq: Every day | RECTAL | Status: DC | PRN

## 2012-06-12 MED ORDER — LIDOCAINE HCL (CARDIAC) 20 MG/ML IV SOLN
INTRAVENOUS | Status: DC | PRN
  Administered 2012-06-12: 60 mg via INTRAVENOUS

## 2012-06-12 MED ORDER — ALENDRONATE SODIUM 70 MG PO TABS: 70.0000 mg | ORAL_TABLET | ORAL | Status: DC

## 2012-06-12 MED ORDER — HYDROMORPHONE HCL PF 1 MG/ML IJ SOLN: 0.5000 mg | INTRAMUSCULAR | Status: DC | PRN

## 2012-06-12 MED ORDER — FERROUS SULFATE 325 (65 FE) MG PO TABS
325.0000 mg | ORAL_TABLET | Freq: Three times a day (TID) | ORAL | Status: DC
  Administered 2012-06-13 – 2012-06-14 (×4): 325 mg via ORAL
  Filled 2012-06-12 (×7): qty 1

## 2012-06-12 SURGICAL SUPPLY — 39 items
ADH SKN CLS APL DERMABOND .7 (GAUZE/BANDAGES/DRESSINGS) ×1
BAG SPEC THK2 15X12 ZIP CLS (MISCELLANEOUS) ×2
BAG ZIPLOCK 12X15 (MISCELLANEOUS) ×4 IMPLANT
BLADE SAW SGTL 18X1.27X75 (BLADE) ×2 IMPLANT
CLOTH BEACON ORANGE TIMEOUT ST (SAFETY) ×2 IMPLANT
DERMABOND ADVANCED (GAUZE/BANDAGES/DRESSINGS) ×1
DERMABOND ADVANCED .7 DNX12 (GAUZE/BANDAGES/DRESSINGS) ×1 IMPLANT
DRAPE C-ARM 42X72 X-RAY (DRAPES) ×2 IMPLANT
DRAPE STERI IOBAN 125X83 (DRAPES) ×2 IMPLANT
DRAPE U-SHAPE 47X51 STRL (DRAPES) ×6 IMPLANT
DRSG AQUACEL AG ADV 3.5X10 (GAUZE/BANDAGES/DRESSINGS) ×2 IMPLANT
DRSG AQUACEL AG ADV 3.5X14 (GAUZE/BANDAGES/DRESSINGS) ×1 IMPLANT
DRSG TEGADERM 4X4.75 (GAUZE/BANDAGES/DRESSINGS) ×1 IMPLANT
DURAPREP 26ML APPLICATOR (WOUND CARE) ×2 IMPLANT
ELECT BLADE TIP CTD 4 INCH (ELECTRODE) ×2 IMPLANT
ELECT REM PT RETURN 9FT ADLT (ELECTROSURGICAL) ×2
ELECTRODE REM PT RTRN 9FT ADLT (ELECTROSURGICAL) ×1 IMPLANT
EVACUATOR 1/8 PVC DRAIN (DRAIN) IMPLANT
FACESHIELD LNG OPTICON STERILE (SAFETY) ×8 IMPLANT
GAUZE SPONGE 2X2 8PLY STRL LF (GAUZE/BANDAGES/DRESSINGS) ×1 IMPLANT
GLOVE BIOGEL PI IND STRL 7.5 (GLOVE) ×1 IMPLANT
GLOVE BIOGEL PI IND STRL 8 (GLOVE) ×1 IMPLANT
GLOVE BIOGEL PI INDICATOR 7.5 (GLOVE) ×1
GLOVE BIOGEL PI INDICATOR 8 (GLOVE) ×1
GLOVE ECLIPSE 8.0 STRL XLNG CF (GLOVE) ×2 IMPLANT
GLOVE ORTHO TXT STRL SZ7.5 (GLOVE) ×4 IMPLANT
GOWN BRE IMP PREV XXLGXLNG (GOWN DISPOSABLE) ×4 IMPLANT
GOWN STRL NON-REIN LRG LVL3 (GOWN DISPOSABLE) ×2 IMPLANT
KIT BASIN OR (CUSTOM PROCEDURE TRAY) ×2 IMPLANT
PACK TOTAL JOINT (CUSTOM PROCEDURE TRAY) ×2 IMPLANT
PADDING CAST COTTON 6X4 STRL (CAST SUPPLIES) ×2 IMPLANT
SPONGE GAUZE 2X2 STER 10/PKG (GAUZE/BANDAGES/DRESSINGS) ×1
SUCTION FRAZIER 12FR DISP (SUCTIONS) ×2 IMPLANT
SUT MNCRL AB 4-0 PS2 18 (SUTURE) ×2 IMPLANT
SUT VIC AB 1 CT1 36 (SUTURE) ×8 IMPLANT
SUT VIC AB 2-0 CT1 27 (SUTURE) ×4
SUT VIC AB 2-0 CT1 TAPERPNT 27 (SUTURE) ×2 IMPLANT
TOWEL OR 17X26 10 PK STRL BLUE (TOWEL DISPOSABLE) ×4 IMPLANT
TRAY FOLEY CATH 14FRSI W/METER (CATHETERS) ×2 IMPLANT

## 2012-06-12 NOTE — Progress Notes (Signed)

## 2012-06-12 NOTE — H&P (Signed)
NAME:  Andrew Ramos, PEW NO.:  [id number]  MEDICAL RECORD NO.:  [id number]  LOCATION:                               FACILITY:  [facility]  PHYSICIAN:  Madlyn Frankel. Charlann Boxer, M.D.  DATE OF BIRTH:  [birth year]  DATE OF ADMISSION:  06/12/2012 DATE OF DISCHARGE:                             HISTORY & PHYSICAL   DATE OF SURGERY:  June 12, 2012.  ADMITTING DIAGNOSIS:  End-stage osteoarthritis of the right hip.  PROPOSED PROCEDURE:  Anterior total hip replacement of the right hip by anterior approach.  HISTORY OF PRESENT ILLNESS:  This is a 78 year old gentleman with a history of previous total hip arthroplasty by anterior approach, who did well, who now has end-stage osteoarthritis of the right hip that has failed conservative treatment.  After discussion of treatments, benefits, risks, and options, the patient is now scheduled for total hip arthroplasty by anterior approach of the right hip.  Note that he has had a history of DVT in the left leg previously, so he is not a candidate for tranexamic acid, will not receive that.  Also note that, he has polymyalgia rheumatica and has been on chronic prednisone therapy, so we will need a prednisone boost prior to surgery and postoperatively.  The patient's medical doctor is Dr. Loreen Freud.  He does plan on going home after surgery.  He is not given his home medications today as he may require a different postoperative regimen due to his DVT in the past then just plain aspirin.  PAST MEDICAL HISTORY:  Drug allergies, none.  CURRENT MEDICATIONS:  Calcium 600 mg with vitamin D once a day, vitamin D3 1000 units daily, lisinopril/hydrochlorothiazide 10/12.5 one daily, prednisone 1 mg daily, aspirin 325 mg daily, terazosin 1 mg daily, Crestor 20 mg daily, and alendronate 70 mg once a week.  SERIOUS MEDICAL ILLNESSES:  Include hypertension, polymyalgia rheumatica, history of prostate cancer, hyperlipidemia, hypertension, and  osteoporosis.  Also history of blood clots in the left leg requiring Coumadin therapy.  PREVIOUS SURGERIES:  Include left hip replacement, left total knee replacement, and tonsillectomy.  FAMILY HISTORY:  Positive for pancreatic cancer, heart attack.  SOCIAL HISTORY:  The patient is married.  He is retired.  He does not smoke and drinks rarely and again plans on going home after surgery.  REVIEW OF SYSTEMS:  CENTRAL NERVOUS SYSTEM:  Negative for headache, blurred vision, or dizziness.  Positive for hearing loss.  PULMONARY: Negative for shortness breath, PND, or orthopnea.  CARDIOVASCULAR: Negative for chest pain, palpitation.  GI:  Negative for ulcers, hepatitis.  GU:  Positive for history of prostate cancer with some urinary retention.  MUSCULOSKELETAL:  Positive as in HPI.  PHYSICAL EXAMINATION:  VITAL SIGNS:  BP 138/83, pulse 72 and regular, respirations 14. HEENT.  Head normocephalic.  Nose patent.  Ears patent.  Pupils equal, round, and reactive to light.  Throat without injection. NECK:  Supple without adenopathy.  Carotids 2+ without bruit. CHEST:  Clear to auscultation.  No rales or rhonchi.  Respirations 14. HEART:  Regular rate and rhythm at 72 beats per minute without murmur. ABDOMEN:  Soft with active bowel sounds.  No masses or organomegaly. NEUROLOGIC:  The patient alert and oriented to time, place, and person. Cranial nerves II through XII grossly intact. EXTREMITIES:  The right hip with decreased range of motion with pain. Neurovascular status is intact.  ASSESSMENT:  End-stage osteoarthritis, right hip.  PLAN:  Total hip arthroplasty by anterior approach, right hip.     Jaquelyn Bitter. Brayleigh Rybacki, P.A.   ______________________________ Madlyn Frankel Charlann Boxer, M.D.    SJC/MEDQ  D:  06/11/2012  T:  06/11/2012  Job:  [id number]

## 2012-06-12 NOTE — Anesthesia Postprocedure Evaluation (Signed)
  Anesthesia Post-op Note  Patient: Andrew Ramos  Procedure(s) Performed: Procedure(s) (LRB): TOTAL HIP ARTHROPLASTY ANTERIOR APPROACH (Right)  Patient Location: PACU  Anesthesia Type: General  Level of Consciousness: awake and alert   Airway and Oxygen Therapy: Patient Spontanous Breathing  Post-op Pain: mild  Post-op Assessment: Post-op Vital signs reviewed, Patient's Cardiovascular Status Stable, Respiratory Function Stable, Patent Airway and No signs of Nausea or vomiting  Post-op Vital Signs: stable  Complications: No apparent anesthesia complications

## 2012-06-12 NOTE — Interval H&P Note (Signed)
History and Physical Interval Note:  06/12/2012 3:29 PM  Andrew Ramos  has presented today for surgery, with the diagnosis of Osteoarthritis of the Right Hip  The various methods of treatment have been discussed with the patient and family. After consideration of risks, benefits and other options for treatment, the patient has consented to  Procedure(s) (LRB) with comments: TOTAL HIP ARTHROPLASTY ANTERIOR APPROACH (Right) as a surgical intervention .  The patient's history has been reviewed, patient examined, no change in status, stable for surgery.  I have reviewed the patient's chart and labs.  Questions were answered to the patient's satisfaction.     Shelda Pal

## 2012-06-12 NOTE — Op Note (Signed)
NAME:  Andrew Ramos NO.: [id number]      MEDICAL RECORD NO.: [id number]      FACILITY:  [facility]      PHYSICIAN:  Durene Romans D  DATE OF BIRTH:  1932/06/11     DATE OF PROCEDURE:  06/12/2012                                 OPERATIVE REPORT         PREOPERATIVE DIAGNOSIS: Right  hip osteoarthritis.      POSTOPERATIVE DIAGNOSIS:  Right hip osteoarthritis.      PROCEDURE:  Right total hip replacement through an anterior approach   utilizing DePuy THR system, component size 58mm pinnacle cup, a size 36+4 neutral   Altrex liner, a size 10 Hi Tri Lock stem with a 36+5 metal ball.      SURGEON:  Madlyn Frankel. Charlann Boxer, M.D.      ASSISTANT:  Lanney Gins, PA      ANESTHESIA:  General.      SPECIMENS:  None.      COMPLICATIONS:  None.      BLOOD LOSS:  350 cc     DRAINS:  One Hemovac.      INDICATION OF THE PROCEDURE:  Andrew Ramos is a 78 y.o. male who had   presented to office for evaluation of right hip pain.  Radiographs revealed   progressive degenerative changes with bone-on-bone   articulation to the  hip joint.  The patient had painful limited range of   motion significantly affecting their overall quality of life.  The patient was failing to    respond to conservative measures, and at this point was ready   to proceed with more definitive measures.  The patient has noted progressive   degenerative changes in his hip, progressive problems and dysfunction   with regarding the hip prior to surgery.  He had previous left THR performed through the posterior approach,  Consent was obtained for   benefit of pain relief.  Specific risk of infection, DVT, component   failure, dislocation, need for revision surgery, as well discussion of   the anterior versus posterior approach were reviewed.  Consent was   obtained for benefit of anterior pain relief through an anterior   approach.      PROCEDURE IN DETAIL:  The patient was  brought to operative theater.   Once adequate anesthesia, preoperative antibiotics, 2gm Ancef administered.   The patient was positioned supine on the OSI Hanna table.  Once adequate   padding of boney process was carried out, we had predraped out the hip, and  used fluoroscopy to confirm orientation of the pelvis and position.      The right hip was then prepped and draped from proximal iliac crest to   mid thigh with shower curtain technique.      Time-out was performed identifying the patient, planned procedure, and   extremity.     An incision was then made 2 cm distal and lateral to the   anterior superior iliac spine extending over the orientation of the   tensor fascia lata muscle and sharp dissection was carried down to the   fascia of the muscle and protractor placed in the soft tissues.      The fascia was then  incised.  The muscle belly was identified and swept   laterally and retractor placed along the superior neck.  Following   cauterization of the circumflex vessels and removing some pericapsular   fat, a second cobra retractor was placed on the inferior neck.  A third   retractor was placed on the anterior acetabulum after elevating the   anterior rectus.  A L-capsulotomy was along the line of the   superior neck to the trochanteric fossa, then extended proximally and   distally.  Tag sutures were placed and the retractors were then placed   intracapsular.  We then identified the trochanteric fossa and   orientation of my neck cut, confirmed this radiographically   and then made a neck osteotomy with the femur on traction.  The femoral   head was removed without difficulty or complication.  Traction was let   off and retractors were placed posterior and anterior around the   acetabulum.      The labrum and foveal tissue were debrided.  I began reaming with a 53mm   reamer and reamed up to 57mm reamer with good bony bed preparation and a 58   cup was chosen.  The  final 58mm Pinnacle cup was then impacted under fluoroscopy  to confirm the depth of penetration and orientation with respect to   abduction.  A screw was placed followed by the hole eliminator.  The final   36+4 Altrex liner was impacted with good visualized rim fit.  The cup was positioned anatomically within the acetabular portion of the pelvis.      At this point, the femur was rolled at 80 degrees.  Further capsule was   released off the inferior aspect of the femoral neck.  I then   released the superior capsule proximally.  The hook was placed laterally   along the femur and elevated manually and held in position with the bed   hook.  The leg was then extended and adducted with the leg rolled to 100   degrees of external rotation.  Once the proximal femur was fully   exposed, I used a box osteotome to set orientation.  I then began   broaching with the starting chili pepper broach and passed this by hand and then broached up to 10.  With the 10 broach in place I chose a high offset neck and did a trial reduction.  With a +5 trial ball I felt that the offset was appropriate, leg lengths   appeared to be equal, confirmed radiographically.   Given these findings, I went ahead and dislocated the hip, repositioned all   retractors and positioned the right hip in the extended and abducted position.  The final 10 Hi Tri Lock stem was   chosen and it was impacted down to the level of neck cut.  Based on this   and the trial reduction, a 36+5 metal ball was chosen and   impacted onto a clean and dry trunnion, and the hip was reduced.  The   hip had been irrigated throughout the case again at this point.  I did   reapproximate the superior capsular leaflet to the anterior leaflet   using #1 Vicryl, placed a medium Hemovac drain deep.  The fascia of the   tensor fascia lata muscle was then reapproximated using #1 Vicryl.  The   remaining wound was closed with 2-0 Vicryl and running 4-0 Monocryl.     The hip was cleaned, dried,  and dressed sterilely using Dermabond and   Aquacel dressing.  Drain site dressed separately.  She was then brought   to recovery room in stable condition tolerating the procedure well.    Lanney Gins, PA-C was present for the entirety of the case involved from   preoperative positioning, perioperative retractor management, general   facilitation of the case, as well as primary wound closure as assistant.            Madlyn Frankel Charlann Boxer, M.D.            MDO/MEDQ  D:  07/12/2011  T:  07/12/2011  Job:  [id number]      Electronically Signed by Durene Romans M.D. on 07/18/2011 09:15:38 AM

## 2012-06-12 NOTE — Transfer of Care (Signed)
Immediate Anesthesia Transfer of Care Note  Patient: Andrew Ramos  Procedure(s) Performed: Procedure(s) (LRB) with comments: TOTAL HIP ARTHROPLASTY ANTERIOR APPROACH (Right)  Patient Location: PACU  Anesthesia Type: General  Level of Consciousness: awake  Airway & Oxygen Therapy: Patient Spontanous Breathing and Patient connected to face mask oxygen  Post-op Assessment: Report given to PACU RN, Post -op Vital signs reviewed and stable and Patient moving all extremities X 4  Post vital signs: stable  Complications: No apparent anesthesia complications

## 2012-06-12 NOTE — Plan of Care (Signed)
Problem: Consults Goal: Diagnosis- Total Joint Replacement Right Anterior HIp

## 2012-06-12 NOTE — Anesthesia Preprocedure Evaluation (Addendum)
Anesthesia Evaluation  Patient identified by MRN, date of birth, ID band Patient awake    Reviewed: Allergy & Precautions, H&P , NPO status , Patient's Chart, lab work & pertinent test results  Airway Mallampati: II TM Distance: >3 FB Neck ROM: Full    Dental No notable dental hx.    Pulmonary neg pulmonary ROS,  breath sounds clear to auscultation  Pulmonary exam normal       Cardiovascular hypertension, Pt. on medications Rhythm:Regular Rate:Normal     Neuro/Psych negative neurological ROS  negative psych ROS   GI/Hepatic Neg liver ROS, (+) Hepatitis -  Endo/Other  negative endocrine ROS  Renal/GU negative Renal ROS  negative genitourinary   Musculoskeletal negative musculoskeletal ROS (+)   Abdominal (+) + obese,   Peds negative pediatric ROS (+)  Hematology negative hematology ROS (+)   Anesthesia Other Findings   Reproductive/Obstetrics negative OB ROS                           Anesthesia Physical Anesthesia Plan  ASA: II  Anesthesia Plan: General   Post-op Pain Management:    Induction: Intravenous  Airway Management Planned:   Additional Equipment:   Intra-op Plan:   Post-operative Plan: Extubation in OR  Informed Consent: I have reviewed the patients History and Physical, chart, labs and discussed the procedure including the risks, benefits and alternatives for the proposed anesthesia with the patient or authorized representative who has indicated his/her understanding and acceptance.   Dental advisory given  Plan Discussed with: CRNA  Anesthesia Plan Comments: (Discussed general versus spinal. Patient prefers general. Had general with last THA and TKA.)       Anesthesia Quick Evaluation

## 2012-06-13 ENCOUNTER — Encounter (HOSPITAL_COMMUNITY): Payer: Self-pay | Admitting: Orthopedic Surgery

## 2012-06-13 LAB — BASIC METABOLIC PANEL
BUN: 20 mg/dL (ref 6–23)
CO2: 24 mEq/L (ref 19–32)
Chloride: 102 mEq/L (ref 96–112)
Creatinine, Ser: 0.93 mg/dL (ref 0.50–1.35)
Glucose, Bld: 226 mg/dL — ABNORMAL HIGH (ref 70–99)
Potassium: 3.9 mEq/L (ref 3.5–5.1)

## 2012-06-13 LAB — CBC
HCT: 32.9 % — ABNORMAL LOW (ref 39.0–52.0)
Hemoglobin: 11.3 g/dL — ABNORMAL LOW (ref 13.0–17.0)
MCV: 92.4 fL (ref 78.0–100.0)
RBC: 3.56 MIL/uL — ABNORMAL LOW (ref 4.22–5.81)
RDW: 13 % (ref 11.5–15.5)
WBC: 7.8 10*3/uL (ref 4.0–10.5)

## 2012-06-13 NOTE — Progress Notes (Signed)
Utilization review completed.  

## 2012-06-13 NOTE — Progress Notes (Signed)
Patient ID: Andrew Ramos, male   DOB: [birth year], 78 y.o.   MRN: [id number] Subjective: 1 Day Post-Op Procedure(s) (LRB): TOTAL HIP ARTHROPLASTY ANTERIOR APPROACH (Right)    Patient reports pain as mild.  Doing very well, slept great last night and already ate breakfast this am  Objective:   VITALS:   Filed Vitals:   06/13/12 0543  BP: 161/90  Pulse: 91  Temp: 97.9 F (36.6 C)  Resp: 16    Neurovascular intact Incision: dressing C/D/I  LABS  Basename 06/13/12 0356 06/11/12 0920  HGB 11.3* 14.0  HCT 32.9* 41.3  WBC 7.8 6.0  PLT 155 201     Basename 06/13/12 0356 06/11/12 0920  NA 134* 139  K 3.9 3.8  BUN 20 20  CREATININE 0.93 1.06  GLUCOSE 226* 116*     Basename 06/11/12 0920  LABPT --  INR 0.95     Assessment/Plan: 1 Day Post-Op Procedure(s) (LRB): TOTAL HIP ARTHROPLASTY ANTERIOR APPROACH (Right)   Up with therapy Discharge home with home health tomorrow Encouraged early activity

## 2012-06-13 NOTE — Evaluation (Signed)
Occupational Therapy Evaluation Patient Details Name: Andrew Ramos MRN: [id number] DOB: 1932-05-07 Today's Date: 06/13/2012 Time: 4098-1191 OT Time Calculation (min): 23 min  OT Assessment / Plan / Recommendation Clinical Impression  Pt. does not need any further OT secondary is I with use of AE.    OT Assessment  Patient does not need any further OT services    Follow Up Recommendations  No OT follow up    Barriers to Discharge      Equipment Recommendations  None recommended by OT    Recommendations for Other Services    Frequency       Precautions / Restrictions Precautions Precautions: None Precaution Comments: direct anterior hip Restrictions Weight Bearing Restrictions: No RLE Weight Bearing: Weight bearing as tolerated       ADL  Grooming: Simulated;Set up Where Assessed - Grooming: Unsupported sitting Upper Body Bathing: Simulated;Set up Where Assessed - Upper Body Bathing: Unsupported sitting Lower Body Bathing: Maximal assistance Where Assessed - Lower Body Bathing: Supported sitting Upper Body Dressing: Set up Where Assessed - Upper Body Dressing: Unsupported sitting Lower Body Dressing: Maximal assistance Where Assessed - Lower Body Dressing: Unsupported sitting Toilet Transfer: Minimal assistance Toilet Transfer Method: Stand pivot Toilet Transfer Equipment: Bedside commode Toileting - Clothing Manipulation and Hygiene: Simulated;Minimal assistance Where Assessed - Engineer, mining and Hygiene: Sit on 3-in-1 or toilet Equipment Used: Reacher;Sock aid Transfers/Ambulation Related to ADLs: Min A ADL Comments: Pt. has AE and DME from previous sx and is I with use.    OT Diagnosis:    OT Problem List:   OT Treatment Interventions:     OT Goals    Visit Information  Assistance Needed: +1    Subjective Data  Subjective: I am not having pain Patient Stated Goal: go home   Prior Functioning     Home Living Lives With:  Spouse Available Help at Discharge: Family Type of Home: House Home Access: Stairs to enter [employer] Shower/Tub: Walk-in shower;Door [employer] Toilet: Standard Bathroom Accessibility: Yes How Accessible: Accessible via walker Home Adaptive Equipment: Bedside commode/3-in-1;Hand-held shower hose;Long-handled shoehorn;Long-handled sponge;Reacher;Shower chair with back;Sock aid;Walker - four wheeled Prior Function Level of Independence: Independent with assistive device(s) Vocation: Retired Musician: HOH         Vision/Perception     Cognition  Overall Cognitive Status: Appears within functional limits for tasks assessed/performed Arousal/Alertness: Awake/alert Orientation Level: Appears intact for tasks assessed Behavior During Session: Andrew Ramos [facility] for tasks performed    Extremity/Trunk Assessment Right Upper Extremity Assessment RUE ROM/Strength/Tone: Within functional levels Left Upper Extremity Assessment LUE ROM/Strength/Tone: Within functional levels     Mobility Bed Mobility Bed Mobility: Not assessed Details for Bed Mobility Assistance: HOB 30* Transfers Sit to Stand: 4: Min assist Stand to Sit: 4: Min assist Details for Transfer Assistance: VCs hand placement and to kick out RLE prior to sitting     Shoulder Instructions     Exercise Total Joint Exercises Ankle Circles/Pumps: AROM;Both;15 reps;Supine Quad Sets: AROM;Both;15 reps;Supine Towel Squeeze: AROM;Both;10 reps;Supine Short Arc Quad: AROM;Right;15 reps;Supine Heel Slides: AAROM;Right;10 reps;Supine Hip ABduction/ADduction: AAROM;Right;10 reps;Supine   Balance     End of Session OT - End of Session Activity Tolerance: Patient tolerated treatment well Patient left: in chair;with call bell/phone within reach  GO     Andrew Ramos 06/13/2012, 4:24 PM

## 2012-06-13 NOTE — Progress Notes (Signed)
Physical Therapy Treatment Patient Details Name: Andrew Ramos MRN: [id number] DOB: 1932-05-03 Today's Date: 06/13/2012 Time: 4098-1191 PT Time Calculation (min): 23 min  PT Assessment / Plan / Recommendation Comments on Treatment Session  Pt progressing very well with mobility. Ambulated 400' with RW, performed THA ther ex.  Will do stair training in morning. Expect will be ready to DC tomorrow.     Follow Up Recommendations  Home health PT    Barriers to Discharge None      Equipment Recommendations  None recommended by PT    Recommendations for Other Services OT consult  Frequency 7X/week   Plan Discharge plan remains appropriate;Frequency remains appropriate    Precautions / Restrictions Precautions Precautions: None Precaution Comments: direct anterior hip Restrictions Weight Bearing Restrictions: No RLE Weight Bearing: Weight bearing as tolerated   Pertinent Vitals/Pain *"It's just stiff" R hip Ice applied after PT session**    Mobility  Bed Mobility Bed Mobility: Not assessed Supine to Sit: 5: Supervision;HOB elevated Details for Bed Mobility Assistance: HOB 30* Transfers Transfers: Sit to Stand;Stand to Sit Sit to Stand: With upper extremity assist;5: Supervision;From chair/3-in-1;With armrests Stand to Sit: To chair/3-in-1;With upper extremity assist;5: Supervision;With armrests Details for Transfer Assistance: VCs hand placement and to kick out RLE prior to sitting Ambulation/Gait Ambulation/Gait Assistance: 5: Supervision Ambulation Distance (Feet): 400 Feet Assistive device: Rolling walker Gait Pattern: Step-through pattern Gait velocity: WNL General Gait Details: no LOB, VCs for flexed neck    Exercises Total Joint Exercises Ankle Circles/Pumps: AROM;Both;15 reps;Supine Quad Sets: AROM;Both;15 reps;Supine Towel Squeeze: AROM;Both;10 reps;Supine Short Arc Quad: AROM;Right;15 reps;Supine Heel Slides: AAROM;Right;10 reps;Supine Hip  ABduction/ADduction: AAROM;Right;10 reps;Supine Long Arc Quad: AROM;Right;10 reps;Seated   PT Diagnosis: Difficulty walking  PT Problem List: Decreased strength;Decreased activity tolerance;Decreased mobility;Pain PT Treatment Interventions: Therapeutic exercise;Functional mobility training;Gait training;DME instruction;Therapeutic activities;Patient/family education   PT Goals Acute Rehab PT Goals PT Goal Formulation: With patient/family Time For Goal Achievement: 06/16/12 Potential to Achieve Goals: Good Pt will go Supine/Side to Sit: Independently;with HOB 0 degrees PT Goal: Supine/Side to Sit - Progress: Goal set today Pt will go Sit to Stand: with modified independence;with upper extremity assist PT Goal: Sit to Stand - Progress: Progressing toward goal Pt will Ambulate: >150 feet;with modified independence;with rolling walker PT Goal: Ambulate - Progress: Progressing toward goal Pt will Go Up / Down Stairs: 1-2 stairs;with supervision PT Goal: Up/Down Stairs - Progress: Goal set today Pt will Perform Home Exercise Program: Independently PT Goal: Perform Home Exercise Program - Progress: Progressing toward goal  Visit Information  Last PT Received On: 06/13/12 Assistance Needed: +1    Subjective Data  Subjective: My hip feels stiff. Patient Stated Goal: to walk dogs   Cognition  Overall Cognitive Status: Appears within functional limits for tasks assessed/performed Arousal/Alertness: Awake/alert Orientation Level: Appears intact for tasks assessed Behavior During Session: [facility] for tasks performed    Balance     End of Session PT - End of Session Equipment Utilized During Treatment: Gait belt Activity Tolerance: Patient tolerated treatment well Patient left: in chair;with call bell/phone within reach Nurse Communication: Mobility status   GP     Ralene Bathe Kistler 06/13/2012, 12:30 PM [phone]

## 2012-06-13 NOTE — Evaluation (Signed)
Physical Therapy Evaluation Patient Details Name: JOSHWA LOEBS MRN: [id number] DOB: 08/29/1932 Today's Date: 06/13/2012 Time: 4098-1191 PT Time Calculation (min): 38 min  PT Assessment / Plan / Recommendation Clinical Impression  78 y.o. male POD #1 for R THA, direct anterior approach. Pt doing very well with mobility, he ambulated 200' with RW and supervision.  Excellent progress expected. Likely pt will be ready to DC home tomorrow with HHPT. Pt would benefit from acute PT to maximize safety and independence with mobility.    PT Assessment  Patient needs continued PT services    Follow Up Recommendations  Home health PT    Barriers to Discharge None      Equipment Recommendations  None recommended by PT    Recommendations for Other Services OT consult   Frequency 7X/week    Precautions / Restrictions Precautions Precautions: None Precaution Comments: direct anterior hip Restrictions Weight Bearing Restrictions: No RLE Weight Bearing: Weight bearing as tolerated   Pertinent Vitals/Pain *"no pain, just discomfort"**      Mobility  Bed Mobility Bed Mobility: Supine to Sit Supine to Sit: 5: Supervision;HOB elevated Details for Bed Mobility Assistance: HOB 30* Transfers Transfers: Sit to Stand;Stand to Sit Sit to Stand: 4: Min guard;From bed;With upper extremity assist Stand to Sit: 4: Min guard;To chair/3-in-1;With upper extremity assist Details for Transfer Assistance: VCs hand placement Ambulation/Gait Ambulation/Gait Assistance: 5: Supervision Ambulation Distance (Feet): 200 Feet Assistive device: Rolling walker Gait Pattern: Step-through pattern;Decreased weight shift to right    Exercises Total Joint Exercises Ankle Circles/Pumps: AROM;Both;15 reps;Supine Quad Sets: AROM;Right;10 reps;Supine Heel Slides: AAROM;Right;10 reps;Supine Hip ABduction/ADduction: AAROM;Right;10 reps;Supine Long Arc Quad: AROM;Right;10 reps;Seated   PT Diagnosis: Difficulty  walking  PT Problem List: Decreased strength;Decreased activity tolerance;Decreased mobility;Pain PT Treatment Interventions: Therapeutic exercise;Functional mobility training;Gait training;DME instruction;Therapeutic activities;Patient/family education   PT Goals Acute Rehab PT Goals PT Goal Formulation: With patient/family Time For Goal Achievement: 06/16/12 Potential to Achieve Goals: Good Pt will go Supine/Side to Sit: Independently;with HOB 0 degrees PT Goal: Supine/Side to Sit - Progress: Goal set today Pt will go Sit to Stand: with modified independence;with upper extremity assist PT Goal: Sit to Stand - Progress: Goal set today Pt will Ambulate: >150 feet;with modified independence;with rolling walker PT Goal: Ambulate - Progress: Goal set today Pt will Go Up / Down Stairs: 1-2 stairs;with supervision PT Goal: Up/Down Stairs - Progress: Goal set today Pt will Perform Home Exercise Program: Independently PT Goal: Perform Home Exercise Program - Progress: Goal set today  Visit Information  Last PT Received On: 06/13/12    Subjective Data  Subjective: This new hip is wonderful! It's like a miracle! Patient Stated Goal: to walk his dogs   Prior Functioning  Home Living Lives With: Spouse Available Help at Discharge: Family Type of Home: House Home Access: Stairs to enter Secretary/administrator of Steps: 1 Home Layout: One level Firefighter: Standard Home Adaptive Equipment: Walker - rolling;Shower chair with back;Bedside commode/3-in-1;Straight cane Prior Function Level of Independence: Independent with assistive device(s) (with cane) Able to Take Stairs?: Yes Driving: Yes Vocation: Retired Musician: Clinical cytogeneticist  Overall Cognitive Status: Appears within functional limits for tasks assessed/performed Arousal/Alertness: Awake/alert Orientation Level: Appears intact for tasks assessed Behavior During Session: [facility] for tasks performed      Extremity/Trunk Assessment Right Upper Extremity Assessment RUE ROM/Strength/Tone: [facility] for tasks assessed Left Upper Extremity Assessment LUE ROM/Strength/Tone: [facility] for tasks assessed Right Lower Extremity Assessment RLE ROM/Strength/Tone: Deficits RLE ROM/Strength/Tone Deficits:  knee ext 3/5, hip flexion/ABD 2/5, ankle 5/5  AAROM WFL throughout RLE Sensation: WFL - Light Touch RLE Coordination: WFL - gross/fine motor Left Lower Extremity Assessment LLE ROM/Strength/Tone: Within functional levels LLE Sensation: WFL - Light Touch LLE Coordination: WFL - gross/fine motor Trunk Assessment Trunk Assessment: Normal   Balance    End of Session PT - End of Session Equipment Utilized During Treatment: Gait belt Activity Tolerance: Patient tolerated treatment well Patient left: in chair;with call bell/phone within reach;with family/visitor present Nurse Communication: Mobility status  GP     Ralene Bathe Kistler 06/13/2012, 11:03 AM  [phone]

## 2012-06-14 DIAGNOSIS — E669 Obesity, unspecified: Secondary | ICD-10-CM | POA: Insufficient documentation

## 2012-06-14 LAB — CBC
HCT: 30.8 % — ABNORMAL LOW (ref 39.0–52.0)
Hemoglobin: 10.7 g/dL — ABNORMAL LOW (ref 13.0–17.0)
MCH: 32.6 pg (ref 26.0–34.0)
MCHC: 34.7 g/dL (ref 30.0–36.0)
MCV: 93.9 fL (ref 78.0–100.0)
RDW: 13.4 % (ref 11.5–15.5)

## 2012-06-14 LAB — BASIC METABOLIC PANEL
BUN: 21 mg/dL (ref 6–23)
Creatinine, Ser: 1.05 mg/dL (ref 0.50–1.35)
GFR calc Af Amer: 76 mL/min — ABNORMAL LOW (ref >90)
GFR calc non Af Amer: 66 mL/min — ABNORMAL LOW (ref >90)
Glucose, Bld: 134 mg/dL — ABNORMAL HIGH (ref 70–99)

## 2012-06-14 MED ORDER — RIVAROXABAN 10 MG PO TABS: 10.0000 mg | ORAL_TABLET | ORAL | Status: DC

## 2012-06-14 MED ORDER — HYDROCODONE-ACETAMINOPHEN 7.5-325 MG PO TABS: 1.0000 | ORAL_TABLET | ORAL | Status: DC | PRN

## 2012-06-14 MED ORDER — DIPHENHYDRAMINE HCL 25 MG PO CAPS: 25.0000 mg | ORAL_CAPSULE | Freq: Four times a day (QID) | ORAL | Status: DC | PRN

## 2012-06-14 MED ORDER — ASPIRIN EC 325 MG PO TBEC: 325.0000 mg | DELAYED_RELEASE_TABLET | Freq: Two times a day (BID) | ORAL | Status: DC

## 2012-06-14 MED ORDER — DSS 100 MG PO CAPS: 100.0000 mg | ORAL_CAPSULE | Freq: Two times a day (BID) | ORAL | Status: DC

## 2012-06-14 MED ORDER — POLYETHYLENE GLYCOL 3350 17 G PO PACK: 17.0000 g | PACK | Freq: Two times a day (BID) | ORAL | Status: DC

## 2012-06-14 MED ORDER — METHOCARBAMOL 500 MG PO TABS: 500.0000 mg | ORAL_TABLET | Freq: Four times a day (QID) | ORAL | Status: DC | PRN

## 2012-06-14 MED ORDER — FERROUS SULFATE 325 (65 FE) MG PO TABS: 325.0000 mg | ORAL_TABLET | Freq: Three times a day (TID) | ORAL | Status: DC

## 2012-06-14 NOTE — Progress Notes (Signed)
Physical Therapy Discharge Patient Details Name: Andrew Ramos MRN: [id number] DOB: 1932/06/12 Today's Date: 06/14/2012 Time: 4098-1191 PT Time Calculation (min): 27 min  Patient discharged from PT services secondary to goals met and no further PT needs identified.  Please see latest therapy progress note for current level of functioning and progress toward goals.    Progress and discharge plan discussed with patient and/or caregiver: Patient/Caregiver agrees with plan  GP     [name] 06/14/2012, 8:53 AM  [phone]

## 2012-06-14 NOTE — Discharge Summary (Signed)
Physician Discharge Summary  Patient ID: FOWLER ANTOS MRN: [id number] DOB/AGE: 02/22/1932 78 y.o.  Admit date: 06/12/2012 Discharge date:  06/14/2012  Procedures:  Procedure(s) (LRB): TOTAL HIP ARTHROPLASTY ANTERIOR APPROACH (Right)  Attending Physician:  Dr. Durene Romans   Admission Diagnoses:   End-stage osteoarthritis of the right hip  Discharge Diagnoses:  Principal Problem:  *S/P right THA, AA Hypertension Obesity Polymyalgia rheumatica History of prostate cancer Hyperlipidemia Osteoporosis History of blood clots in the left leg requiring Coumadin therapy  HPI: This is a 78 year old gentleman with a history of previous total hip arthroplasty by anterior approach, who did well, who now has end-stage osteoarthritis of the right hip that has failed conservative treatment. After discussion of treatments, benefits, risks, and options, the patient is now scheduled for total hip arthroplasty by anterior approach of the right hip. Note that he has had a history of DVT in the left leg previously, so he is not a candidate for tranexamic acid, will not receive that. Also note that, he has polymyalgia rheumatica and has been on chronic prednisone therapy, so we will need a prednisone boost prior to surgery and postoperatively. The patient's medical doctor is Dr. Loreen Freud. He does plan on going home after surgery. He is not given his home medications today as he may require a different postoperative regimen due to his DVT in the past then just plain aspirin.   PCP: Loreen Freud, DO   Discharged Condition: good  Hospital Course:  Patient underwent the above stated procedure on 06/12/2012. Patient tolerated the procedure well and brought to the recovery room in good condition and subsequently to the floor.  POD #1 BP: 161/90 ; Pulse: 91 ; Temp: 97.9 F (36.6 C) ; Resp: 16  Pt's foley was removed, as well as the hemovac drain removed. IV was changed to a saline lock. Patient  reports pain as mild. Doing very well, slept great last night and already ate breakfast this am. Neurovascular intact, dorsiflexion/plantar flexion intact, incision: dressing C/D/I, no cellulitis present and compartment soft.   LABS  Basename  06/13/12 0356   HGB  11.3  HCT  32.9   POD #2  BP: 115/65 ; Pulse: 81 ; Temp: 97.7 F (36.5 C) ; Resp: 18 Patient reports pain as mild, pain well controlled. No events throughout the night. Ready to be discharged home. Neurovascular intact, dorsiflexion/plantar flexion intact, incision: dressing C/D/I, no cellulitis present and compartment soft.   LABS  Basename  06/14/12 0403   HGB  10.7  HCT  30.8    Discharge Exam: General appearance: alert, cooperative and no distress Extremities: Homans sign is negative, no sign of DVT, no edema, redness or tenderness in the calves or thighs and no ulcers, gangrene or trophic changes  Disposition:  Home with follow up in 2 weeks   Follow-up Information    Follow up with Shelda Pal, MD. In 2 weeks.   Contact information:   [facility] [address] [place] [address] [phone]          Discharge Orders    Future Orders Please Complete By Expires   Diet - low sodium heart healthy      Call MD / Call 911      Comments:   If you experience chest pain or shortness of breath, CALL 911 and be transported to the hospital emergency room.  If you develope a fever above 101 F, pus (white drainage) or increased drainage or  redness at the wound, or calf pain, call your surgeon's office.   Discharge instructions      Comments:   Maintain surgical dressing for 8 days, then replace with gauze and tape. Keep the area dry and clean until follow up. Follow up in 2 weeks at [facility]. Call with any questions or concerns.   Constipation Prevention      Comments:   Drink plenty of fluids.  Prune juice may be helpful.  You may use a stool softener, such as  Colace (over the counter) 100 mg twice a day.  Use MiraLax (over the counter) for constipation as needed.   Increase activity slowly as tolerated      Change dressing      Comments:   Maintain surgical dressing for 8 days, then replace with 4x4 guaze and tape. Keep the area dry and clean.   TED hose      Comments:   Use stockings (TED hose) for 2 weeks on both leg(s).  You may remove them at night for sleeping.      Current Discharge Medication List    START taking these medications   Details  aspirin EC 325 MG tablet Take 1 tablet (325 mg total) by mouth 2 (two) times daily. X 4 weeks Qty: 60 tablet, Refills: 0   Comments: Start the day after finishing the Xarelto. Take for 4 weeks.    diphenhydrAMINE (BENADRYL) 25 mg capsule Take 1 capsule (25 mg total) by mouth every 6 (six) hours as needed for itching, allergies or sleep. Qty: 30 capsule    docusate sodium 100 MG CAPS Take 100 mg by mouth 2 (two) times daily. Qty: 10 capsule    ferrous sulfate 325 (65 FE) MG tablet Take 1 tablet (325 mg total) by mouth 3 (three) times daily after meals.    HYDROcodone-acetaminophen (NORCO) 7.5-325 MG per tablet Take 1-2 tablets by mouth every 4 (four) hours as needed for pain. Qty: 120 tablet, Refills: 0    methocarbamol (ROBAXIN) 500 MG tablet Take 1 tablet (500 mg total) by mouth every 6 (six) hours as needed (muscle spasms). Qty: 50 tablet, Refills: 0    polyethylene glycol (MIRALAX / GLYCOLAX) packet Take 17 g by mouth 2 (two) times daily. Qty: 14 each    rivaroxaban (XARELTO) 10 MG TABS tablet Take 1 tablet (10 mg total) by mouth daily. Qty: 14 tablet, Refills: 0   Comments: Take for 2 weeks, then start taking aspirin 325mg  twice a day      CONTINUE these medications which have NOT CHANGED   Details  Calcium Carbonate-Vitamin D (CALCIUM 600 + D PO) Take 1 tablet by mouth daily.    Cholecalciferol (VITAMIN D3) 1000 UNITS tablet Take 1,000 Units by mouth daily.        lisinopril-hydrochlorothiazide (PRINZIDE,ZESTORETIC) 10-12.5 MG per tablet Take 1 tablet by mouth daily with breakfast.    predniSONE (DELTASONE) 1 MG tablet Take 1 mg by mouth every morning.     rosuvastatin (CRESTOR) 20 MG tablet Take 20 mg by mouth at bedtime.    terazosin (HYTRIN) 1 MG capsule TAKE 1 CAPSULE BY MOUTH AT BEDTIME Qty: 90 capsule, Refills: 0    alendronate (FOSAMAX) 70 MG tablet Take 70 mg by mouth every 7 (seven) days. Take with a full glass of water on an empty stomach.    Multiple Vitamins-Minerals (CENTRUM SILVER ULTRA MENS) TABS Take 1 tablet by mouth daily.        STOP taking these  medications     acetaminophen (TYLENOL) 500 MG tablet Comments:  Reason for Stopping:       aspirin 325 MG tablet Comments:  Reason for Stopping:           Signed: Anastasio Auerbach. Malka Bocek   PAC  06/14/2012, 9:21 AM

## 2012-06-14 NOTE — Progress Notes (Signed)
   Subjective: 2 Days Post-Op Procedure(s) (LRB): TOTAL HIP ARTHROPLASTY ANTERIOR APPROACH (Right)   Patient reports pain as mild, pain well controlled. No events throughout the night. Ready to be discharged home.  Objective:   VITALS:   Filed Vitals:   06/14/12 0526  BP: 115/65  Pulse: 81  Temp: 97.7 F (36.5 C)  Resp: 18    Neurovascular intact Dorsiflexion/Plantar flexion intact Incision: dressing C/D/I No cellulitis present Compartment soft  LABS  Basename 06/14/12 0403 06/13/12 0356 06/11/12 0920  HGB 10.7* 11.3* 14.0  HCT 30.8* 32.9* 41.3  WBC 8.2 7.8 6.0  PLT 172 155 201     Basename 06/14/12 0403 06/13/12 0356 06/11/12 0920  NA 138 134* 139  K 3.9 3.9 3.8  BUN 21 20 20   CREATININE 1.05 0.93 1.06  GLUCOSE 134* 226* 116*     Assessment/Plan: 2 Days Post-Op Procedure(s) (LRB): TOTAL HIP ARTHROPLASTY ANTERIOR APPROACH (Right) Up with therapy Discharge home with home health Follow up in 2 weeks at [facility].  Follow-up Information    Follow up with OLIN,Yousra Ivens D in 2 weeks.   Contact information:   [facility] [address], [address] [place] [address] [phone]         Expected ABLA  Treated with iron and will observe  Obese (BMI 30-39.9) Estimated Body mass index is 32.82 kg/(m^2) as calculated from the following:   Height as of this encounter: 6\' 0" (1.829 m).   Weight as of this encounter: 242 lb(109.77 kg). Patient also counseled that weight may inhibit the healing process Patient counseled that losing weight will help with future health issues      Anastasio Auerbach. Kaliyah Gladman   PAC  06/14/2012, 8:49 AM

## 2012-06-14 NOTE — Plan of Care (Signed)
Problem: Consults Goal: Diagnosis- Total Joint Replacement Outcome: Completed/Met Date Met:  06/14/12 PRIMARY TOTAL HIP RIGHT ANTERIOR

## 2012-06-14 NOTE — Progress Notes (Signed)
Physical Therapy Treatment Patient Details Name: Andrew Ramos MRN: [id number] DOB: 1932-07-18 Today's Date: 06/14/2012 Time: 1191-4782 PT Time Calculation (min): 27 min  PT Assessment / Plan / Recommendation Comments on Treatment Session  Pt doing very well with mobility. Ambulated 400' with RW, stair training completed, ther ex performed. Ready to DC home from PT standpoint. PT goals met.     Follow Up Recommendations  Home health PT    Barriers to Discharge        Equipment Recommendations  None recommended by PT    Recommendations for Other Services    Frequency     Plan All goals met and education completed, patient dischaged from PT services    Precautions / Restrictions Precautions Precautions: None Precaution Comments: direct anterior hip Restrictions Weight Bearing Restrictions: No RLE Weight Bearing: Weight bearing as tolerated   Pertinent Vitals/Pain *"just stiff, no pain" R hip Ice applied after PT**    Mobility  Bed Mobility Supine to Sit: 6: Modified independent (Device/Increase time) Details for Bed Mobility Assistance: HOB 15* Transfers Transfers: Sit to Stand;Stand to Sit Sit to Stand: With upper extremity assist;6: Modified independent (Device/Increase time);From bed Stand to Sit: To chair/3-in-1;With upper extremity assist;With armrests;6: Modified independent (Device/Increase time) Ambulation/Gait Ambulation/Gait Assistance: 6: Modified independent (Device/Increase time) Ambulation Distance (Feet): 400 Feet Assistive device: Rolling walker Gait Pattern: Step-through pattern Gait velocity: WNL General Gait Details: VCs for flexed neck Stairs: Yes Stairs Assistance: 6: Modified independent (Device/Increase time) Stair Management Technique: Backwards;With walker Number of Stairs: 1  Wheelchair Mobility Wheelchair Mobility: No    Exercises Total Joint Exercises Ankle Circles/Pumps: AROM;Both;15 reps;Supine Quad Sets: AROM;Both;15  reps;Supine Short Arc Quad: AROM;Right;Supine;20 reps Heel Slides: Right;Supine;15 reps;AROM Hip ABduction/ADduction: Right;Supine;AROM;15 reps   PT Diagnosis:    PT Problem List:   PT Treatment Interventions:     PT Goals Acute Rehab PT Goals PT Goal Formulation: With patient/family Time For Goal Achievement: 06/16/12 Potential to Achieve Goals: Good Pt will go Supine/Side to Sit: Independently;with HOB 0 degrees PT Goal: Supine/Side to Sit - Progress: Met Pt will go Sit to Stand: with modified independence;with upper extremity assist PT Goal: Sit to Stand - Progress: Met Pt will Ambulate: >150 feet;with modified independence;with rolling walker PT Goal: Ambulate - Progress: Met Pt will Go Up / Down Stairs: 1-2 stairs;with supervision PT Goal: Up/Down Stairs - Progress: Met Pt will Perform Home Exercise Program: Independently PT Goal: Perform Home Exercise Program - Progress: Met  Visit Information  Last PT Received On: 06/14/12 Assistance Needed: +1    Subjective Data  Subjective: Pt reports stiffness but no pain in R hip.    Cognition  Overall Cognitive Status: Appears within functional limits for tasks assessed/performed Arousal/Alertness: Awake/alert Orientation Level: Appears intact for tasks assessed Behavior During Session: [facility] for tasks performed    Balance     End of Session PT - End of Session Activity Tolerance: Patient tolerated treatment well Patient left: in chair;with call bell/phone within reach Nurse Communication: Mobility status   GP     Ralene Bathe Kistler 06/14/2012, 8:51 AM [phone]

## 2012-06-15 DIAGNOSIS — IMO0001 Reserved for inherently not codable concepts without codable children: Secondary | ICD-10-CM | POA: Diagnosis not present

## 2012-06-15 DIAGNOSIS — Z471 Aftercare following joint replacement surgery: Secondary | ICD-10-CM | POA: Diagnosis not present

## 2012-06-15 DIAGNOSIS — Z96649 Presence of unspecified artificial hip joint: Secondary | ICD-10-CM | POA: Diagnosis not present

## 2012-06-15 DIAGNOSIS — Z4801 Encounter for change or removal of surgical wound dressing: Secondary | ICD-10-CM | POA: Diagnosis not present

## 2012-06-15 DIAGNOSIS — Z7901 Long term (current) use of anticoagulants: Secondary | ICD-10-CM | POA: Diagnosis not present

## 2012-06-15 NOTE — Care Management Note (Signed)
    Page 1 of 2   06/15/2012     9:25:12 AM   CARE MANAGEMENT NOTE 06/15/2012  Patient:  Andrew Ramos, Andrew Ramos   Account Number:  [id number]  Date Initiated:  06/14/2012  Documentation initiated by:  Colleen Can  Subjective/Objective Assessment:   DX OSTEOARTHRITIS RIGHT HIP, HIP REPLACEMNT -ANTERIOR APPROACH     Action/Plan:   CM SPOKE WITH PATIENT. PLANS ARE FOR PATIENT TO RETURN TO HIS HOME WHERE SPOUSE WILL BE CAREGIVER. ALREADY HAS DME. [facility] WILL PROVIDE HH SERVICES   Anticipated DC Date:  06/14/2012   Anticipated DC Plan:  HOME W HOME HEALTH SERVICES  In-house referral  NA      DC Planning Services  CM consult      [facility] Choice  HOME HEALTH   Choice offered to / List presented to:  C-1 Patient   DME arranged  NA      DME agency  NA     HH arranged  HH-2 PT      [facility] agency  [facility]   Status of service:  Completed, signed off Medicare Important Message given?  NA - LOS <3 / Initial given by admissions (If response is "NO", the following Medicare IM given date fields will be blank) Date Medicare IM given:   Date Additional Medicare IM given:    Discharge Disposition:  HOME W HOME HEALTH SERVICES  Per UR Regulation:    If discussed at Long Length of Stay Meetings, dates discussed:    Comments:  hOME hEALTH SERVICES WILL START 06/15/2012/LMANNING,RN BSN CCM [phone]

## 2012-06-18 DIAGNOSIS — Z96649 Presence of unspecified artificial hip joint: Secondary | ICD-10-CM | POA: Diagnosis not present

## 2012-06-18 DIAGNOSIS — Z471 Aftercare following joint replacement surgery: Secondary | ICD-10-CM | POA: Diagnosis not present

## 2012-06-18 DIAGNOSIS — Z4801 Encounter for change or removal of surgical wound dressing: Secondary | ICD-10-CM | POA: Diagnosis not present

## 2012-06-18 DIAGNOSIS — Z7901 Long term (current) use of anticoagulants: Secondary | ICD-10-CM | POA: Diagnosis not present

## 2012-06-18 DIAGNOSIS — IMO0001 Reserved for inherently not codable concepts without codable children: Secondary | ICD-10-CM | POA: Diagnosis not present

## 2012-06-20 DIAGNOSIS — Z96649 Presence of unspecified artificial hip joint: Secondary | ICD-10-CM | POA: Diagnosis not present

## 2012-06-20 DIAGNOSIS — Z471 Aftercare following joint replacement surgery: Secondary | ICD-10-CM | POA: Diagnosis not present

## 2012-06-20 DIAGNOSIS — IMO0001 Reserved for inherently not codable concepts without codable children: Secondary | ICD-10-CM | POA: Diagnosis not present

## 2012-06-20 DIAGNOSIS — Z7901 Long term (current) use of anticoagulants: Secondary | ICD-10-CM | POA: Diagnosis not present

## 2012-06-20 DIAGNOSIS — Z4801 Encounter for change or removal of surgical wound dressing: Secondary | ICD-10-CM | POA: Diagnosis not present

## 2012-06-22 DIAGNOSIS — IMO0001 Reserved for inherently not codable concepts without codable children: Secondary | ICD-10-CM | POA: Diagnosis not present

## 2012-06-22 DIAGNOSIS — Z7901 Long term (current) use of anticoagulants: Secondary | ICD-10-CM | POA: Diagnosis not present

## 2012-06-22 DIAGNOSIS — Z4801 Encounter for change or removal of surgical wound dressing: Secondary | ICD-10-CM | POA: Diagnosis not present

## 2012-06-22 DIAGNOSIS — Z471 Aftercare following joint replacement surgery: Secondary | ICD-10-CM | POA: Diagnosis not present

## 2012-06-22 DIAGNOSIS — Z96649 Presence of unspecified artificial hip joint: Secondary | ICD-10-CM | POA: Diagnosis not present

## 2012-06-25 DIAGNOSIS — Z471 Aftercare following joint replacement surgery: Secondary | ICD-10-CM | POA: Diagnosis not present

## 2012-06-25 DIAGNOSIS — Z4801 Encounter for change or removal of surgical wound dressing: Secondary | ICD-10-CM | POA: Diagnosis not present

## 2012-06-25 DIAGNOSIS — Z7901 Long term (current) use of anticoagulants: Secondary | ICD-10-CM | POA: Diagnosis not present

## 2012-06-25 DIAGNOSIS — Z96649 Presence of unspecified artificial hip joint: Secondary | ICD-10-CM | POA: Diagnosis not present

## 2012-06-25 DIAGNOSIS — IMO0001 Reserved for inherently not codable concepts without codable children: Secondary | ICD-10-CM | POA: Diagnosis not present

## 2012-06-26 DIAGNOSIS — Z471 Aftercare following joint replacement surgery: Secondary | ICD-10-CM | POA: Diagnosis not present

## 2012-06-26 DIAGNOSIS — IMO0001 Reserved for inherently not codable concepts without codable children: Secondary | ICD-10-CM | POA: Diagnosis not present

## 2012-06-26 DIAGNOSIS — Z7901 Long term (current) use of anticoagulants: Secondary | ICD-10-CM | POA: Diagnosis not present

## 2012-06-26 DIAGNOSIS — Z4801 Encounter for change or removal of surgical wound dressing: Secondary | ICD-10-CM | POA: Diagnosis not present

## 2012-06-26 DIAGNOSIS — Z96649 Presence of unspecified artificial hip joint: Secondary | ICD-10-CM | POA: Diagnosis not present

## 2012-06-29 DIAGNOSIS — Z4801 Encounter for change or removal of surgical wound dressing: Secondary | ICD-10-CM | POA: Diagnosis not present

## 2012-06-29 DIAGNOSIS — Z96649 Presence of unspecified artificial hip joint: Secondary | ICD-10-CM | POA: Diagnosis not present

## 2012-06-29 DIAGNOSIS — Z471 Aftercare following joint replacement surgery: Secondary | ICD-10-CM | POA: Diagnosis not present

## 2012-06-29 DIAGNOSIS — Z7901 Long term (current) use of anticoagulants: Secondary | ICD-10-CM | POA: Diagnosis not present

## 2012-06-29 DIAGNOSIS — IMO0001 Reserved for inherently not codable concepts without codable children: Secondary | ICD-10-CM | POA: Diagnosis not present

## 2012-07-02 ENCOUNTER — Encounter: Payer: Self-pay | Admitting: Gastroenterology

## 2012-07-02 DIAGNOSIS — Z4801 Encounter for change or removal of surgical wound dressing: Secondary | ICD-10-CM | POA: Diagnosis not present

## 2012-07-02 DIAGNOSIS — Z96649 Presence of unspecified artificial hip joint: Secondary | ICD-10-CM | POA: Diagnosis not present

## 2012-07-02 DIAGNOSIS — IMO0001 Reserved for inherently not codable concepts without codable children: Secondary | ICD-10-CM | POA: Diagnosis not present

## 2012-07-02 DIAGNOSIS — Z7901 Long term (current) use of anticoagulants: Secondary | ICD-10-CM | POA: Diagnosis not present

## 2012-07-02 DIAGNOSIS — Z471 Aftercare following joint replacement surgery: Secondary | ICD-10-CM | POA: Diagnosis not present

## 2012-07-04 DIAGNOSIS — Z4801 Encounter for change or removal of surgical wound dressing: Secondary | ICD-10-CM | POA: Diagnosis not present

## 2012-07-04 DIAGNOSIS — Z471 Aftercare following joint replacement surgery: Secondary | ICD-10-CM | POA: Diagnosis not present

## 2012-07-04 DIAGNOSIS — Z7901 Long term (current) use of anticoagulants: Secondary | ICD-10-CM | POA: Diagnosis not present

## 2012-07-04 DIAGNOSIS — C61 Malignant neoplasm of prostate: Secondary | ICD-10-CM | POA: Diagnosis not present

## 2012-07-04 DIAGNOSIS — Z96649 Presence of unspecified artificial hip joint: Secondary | ICD-10-CM | POA: Diagnosis not present

## 2012-07-04 DIAGNOSIS — IMO0001 Reserved for inherently not codable concepts without codable children: Secondary | ICD-10-CM | POA: Diagnosis not present

## 2012-07-06 DIAGNOSIS — Z96649 Presence of unspecified artificial hip joint: Secondary | ICD-10-CM | POA: Diagnosis not present

## 2012-07-06 DIAGNOSIS — Z4801 Encounter for change or removal of surgical wound dressing: Secondary | ICD-10-CM | POA: Diagnosis not present

## 2012-07-06 DIAGNOSIS — IMO0001 Reserved for inherently not codable concepts without codable children: Secondary | ICD-10-CM | POA: Diagnosis not present

## 2012-07-06 DIAGNOSIS — Z7901 Long term (current) use of anticoagulants: Secondary | ICD-10-CM | POA: Diagnosis not present

## 2012-07-06 DIAGNOSIS — Z471 Aftercare following joint replacement surgery: Secondary | ICD-10-CM | POA: Diagnosis not present

## 2012-07-09 DIAGNOSIS — Z4801 Encounter for change or removal of surgical wound dressing: Secondary | ICD-10-CM | POA: Diagnosis not present

## 2012-07-09 DIAGNOSIS — IMO0001 Reserved for inherently not codable concepts without codable children: Secondary | ICD-10-CM | POA: Diagnosis not present

## 2012-07-09 DIAGNOSIS — M159 Polyosteoarthritis, unspecified: Secondary | ICD-10-CM | POA: Diagnosis not present

## 2012-07-09 DIAGNOSIS — Z96649 Presence of unspecified artificial hip joint: Secondary | ICD-10-CM | POA: Diagnosis not present

## 2012-07-09 DIAGNOSIS — M353 Polymyalgia rheumatica: Secondary | ICD-10-CM | POA: Diagnosis not present

## 2012-07-09 DIAGNOSIS — Z471 Aftercare following joint replacement surgery: Secondary | ICD-10-CM | POA: Diagnosis not present

## 2012-07-09 DIAGNOSIS — Z7901 Long term (current) use of anticoagulants: Secondary | ICD-10-CM | POA: Diagnosis not present

## 2012-07-09 DIAGNOSIS — M810 Age-related osteoporosis without current pathological fracture: Secondary | ICD-10-CM | POA: Diagnosis not present

## 2012-07-09 DIAGNOSIS — M76899 Other specified enthesopathies of unspecified lower limb, excluding foot: Secondary | ICD-10-CM | POA: Diagnosis not present

## 2012-07-10 DIAGNOSIS — Z96649 Presence of unspecified artificial hip joint: Secondary | ICD-10-CM | POA: Diagnosis not present

## 2012-07-10 DIAGNOSIS — IMO0001 Reserved for inherently not codable concepts without codable children: Secondary | ICD-10-CM | POA: Diagnosis not present

## 2012-07-10 DIAGNOSIS — Z7901 Long term (current) use of anticoagulants: Secondary | ICD-10-CM | POA: Diagnosis not present

## 2012-07-10 DIAGNOSIS — Z471 Aftercare following joint replacement surgery: Secondary | ICD-10-CM | POA: Diagnosis not present

## 2012-07-10 DIAGNOSIS — Z4801 Encounter for change or removal of surgical wound dressing: Secondary | ICD-10-CM | POA: Diagnosis not present

## 2012-07-11 ENCOUNTER — Ambulatory Visit (INDEPENDENT_AMBULATORY_CARE_PROVIDER_SITE_OTHER): Payer: Medicare Other

## 2012-07-11 DIAGNOSIS — C61 Malignant neoplasm of prostate: Secondary | ICD-10-CM | POA: Diagnosis not present

## 2012-07-11 DIAGNOSIS — Z23 Encounter for immunization: Secondary | ICD-10-CM | POA: Diagnosis not present

## 2012-07-11 DIAGNOSIS — N401 Enlarged prostate with lower urinary tract symptoms: Secondary | ICD-10-CM | POA: Diagnosis not present

## 2012-07-12 DIAGNOSIS — IMO0001 Reserved for inherently not codable concepts without codable children: Secondary | ICD-10-CM | POA: Diagnosis not present

## 2012-07-12 DIAGNOSIS — Z96649 Presence of unspecified artificial hip joint: Secondary | ICD-10-CM | POA: Diagnosis not present

## 2012-07-12 DIAGNOSIS — Z4801 Encounter for change or removal of surgical wound dressing: Secondary | ICD-10-CM | POA: Diagnosis not present

## 2012-07-12 DIAGNOSIS — Z7901 Long term (current) use of anticoagulants: Secondary | ICD-10-CM | POA: Diagnosis not present

## 2012-07-12 DIAGNOSIS — Z471 Aftercare following joint replacement surgery: Secondary | ICD-10-CM | POA: Diagnosis not present

## 2012-07-17 DIAGNOSIS — M810 Age-related osteoporosis without current pathological fracture: Secondary | ICD-10-CM | POA: Diagnosis not present

## 2012-07-24 ENCOUNTER — Ambulatory Visit (INDEPENDENT_AMBULATORY_CARE_PROVIDER_SITE_OTHER): Payer: Medicare Other | Admitting: Family Medicine

## 2012-07-24 ENCOUNTER — Encounter: Payer: Self-pay | Admitting: Family Medicine

## 2012-07-24 VITALS — BP 130/70 | HR 100 | Temp 98.3°F | Wt 239.0 lb

## 2012-07-24 DIAGNOSIS — J069 Acute upper respiratory infection, unspecified: Secondary | ICD-10-CM

## 2012-07-24 MED ORDER — GUAIFENESIN-CODEINE 100-10 MG/5ML PO SYRP: ORAL_SOLUTION | ORAL | Status: DC

## 2012-07-24 NOTE — Progress Notes (Signed)
  Subjective:     Andrew Ramos is a 78 y.o. male who presents for evaluation of symptoms of a URI. Symptoms include cough described as nonproductive, no  fever, non productive cough, post nasal drip and sore throat. Onset of symptoms was a few days ago, and has been unchanged since that time. Treatment to date: cough suppressants.  The following portions of the patient's history were reviewed and updated as appropriate: allergies, current medications, past family history, past medical history, past social history, past surgical history and problem list.  Review of Systems Pertinent items are noted in HPI.   Objective:    BP 130/70  Pulse 100  Temp 98.3 F (36.8 C) (Oral)  Wt 239 lb (108.41 kg)  SpO2 97% General appearance: alert, cooperative, appears stated age and no distress Ears: normal TM's and external ear canals both ears Nose: Nares normal. Septum midline. Mucosa normal. No drainage or sinus tenderness. Throat: abnormal findings: mild oropharyngeal erythema and pnd Neck: no adenopathy, supple, symmetrical, trachea midline and thyroid not enlarged, symmetric, no tenderness/mass/nodules Lungs: clear to auscultation bilaterally Heart: S1, S2 normal   Assessment:    viral upper respiratory illness   Plan:    Discussed diagnosis and treatment of URI. Suggested symptomatic OTC remedies. Nasal saline spray for congestion. Nasal steroids per orders. Follow up as needed.

## 2012-07-24 NOTE — Patient Instructions (Addendum)
Upper Respiratory Infection, Adult An upper respiratory infection (URI) is also known as the common cold. It is often caused by a type of germ (virus). Colds are easily spread (contagious). You can pass it to others by kissing, coughing, sneezing, or drinking out of the same glass. Usually, you get better in 1 or 2 weeks.  HOME CARE   Only take medicine as told by your doctor.  Use a warm mist humidifier or breathe in steam from a hot shower.  Drink enough water and fluids to keep your pee (urine) clear or pale yellow.  Get plenty of rest.  Return to work when your temperature is back to normal or as told by your doctor. You may use a face mask and wash your hands to stop your cold from spreading. GET HELP RIGHT AWAY IF:   After the first few days, you feel you are getting worse.  You have questions about your medicine.  You have chills, shortness of breath, or brown or red spit (mucus).  You have yellow or brown snot (nasal discharge) or pain in the face, especially when you bend forward.  You have a fever, puffy (swollen) neck, pain when you swallow, or white spots in the back of your throat.  You have a bad headache, ear pain, sinus pain, or chest pain.  You have a high-pitched whistling sound when you breathe in and out (wheezing).  You have a lasting cough or cough up blood.  You have sore muscles or a stiff neck. MAKE SURE YOU:   Understand these instructions.  Will watch your condition.  Will get help right away if you are not doing well or get worse. Document Released: 02/22/2008 Document Revised: 11/28/2011 Document Reviewed: 01/10/2011 [facility] Patient Information 2013 [place], [place].   USE AN ANTIHISTAMINE-- IE --ALLEGRA,  ZYRTEC OR CLARITIN USE NASAL SPRAY SAMPLE CALL us IF YOUR SYMPTOMS WORSEN

## 2012-07-25 DIAGNOSIS — M169 Osteoarthritis of hip, unspecified: Secondary | ICD-10-CM | POA: Diagnosis not present

## 2012-07-27 ENCOUNTER — Encounter: Payer: Self-pay | Admitting: Gastroenterology

## 2012-08-09 DIAGNOSIS — M810 Age-related osteoporosis without current pathological fracture: Secondary | ICD-10-CM | POA: Diagnosis not present

## 2012-08-09 DIAGNOSIS — M353 Polymyalgia rheumatica: Secondary | ICD-10-CM | POA: Diagnosis not present

## 2012-08-09 DIAGNOSIS — M76899 Other specified enthesopathies of unspecified lower limb, excluding foot: Secondary | ICD-10-CM | POA: Diagnosis not present

## 2012-08-09 DIAGNOSIS — M159 Polyosteoarthritis, unspecified: Secondary | ICD-10-CM | POA: Diagnosis not present

## 2012-08-27 ENCOUNTER — Ambulatory Visit (AMBULATORY_SURGERY_CENTER): Payer: Medicare Other | Admitting: *Deleted

## 2012-08-27 VITALS — Ht 72.0 in | Wt 243.4 lb

## 2012-08-27 DIAGNOSIS — Z1211 Encounter for screening for malignant neoplasm of colon: Secondary | ICD-10-CM

## 2012-08-27 DIAGNOSIS — Z86010 Personal history of colonic polyps: Secondary | ICD-10-CM

## 2012-08-27 MED ORDER — PEG-KCL-NACL-NASULF-NA ASC-C 100 G PO SOLR: ORAL | Status: DC

## 2012-08-27 NOTE — Progress Notes (Signed)
No allergies to eggs or soy products 

## 2012-09-07 ENCOUNTER — Ambulatory Visit (AMBULATORY_SURGERY_CENTER): Payer: Medicare Other | Admitting: Gastroenterology

## 2012-09-07 ENCOUNTER — Encounter: Payer: Self-pay | Admitting: Gastroenterology

## 2012-09-07 VITALS — BP 130/75 | HR 56 | Temp 98.7°F | Resp 30 | Ht 72.0 in | Wt 243.0 lb

## 2012-09-07 DIAGNOSIS — M353 Polymyalgia rheumatica: Secondary | ICD-10-CM | POA: Diagnosis not present

## 2012-09-07 DIAGNOSIS — D126 Benign neoplasm of colon, unspecified: Secondary | ICD-10-CM

## 2012-09-07 DIAGNOSIS — Z86010 Personal history of colonic polyps: Secondary | ICD-10-CM

## 2012-09-07 DIAGNOSIS — K5730 Diverticulosis of large intestine without perforation or abscess without bleeding: Secondary | ICD-10-CM

## 2012-09-07 DIAGNOSIS — Z1211 Encounter for screening for malignant neoplasm of colon: Secondary | ICD-10-CM

## 2012-09-07 DIAGNOSIS — I10 Essential (primary) hypertension: Secondary | ICD-10-CM | POA: Diagnosis not present

## 2012-09-07 DIAGNOSIS — K635 Polyp of colon: Secondary | ICD-10-CM

## 2012-09-07 MED ORDER — SODIUM CHLORIDE 0.9 % IV SOLN: 500.0000 mL | INTRAVENOUS | Status: DC

## 2012-09-07 NOTE — Op Note (Signed)
[facility] [address] [place], [address]   COLONOSCOPY PROCEDURE REPORT  PATIENT: Andrew, Ramos  MR#: [id number] BIRTHDATE: 09-18-1932 , 80  yrs. old GENDER: Male ENDOSCOPIST: Mardella Layman, MD, [facility] REFERRED BY: PROCEDURE DATE:  09/07/2012 PROCEDURE:   Colonoscopy with snare polypectomy and Colonoscopy with biopsy ASA CLASS:   Class II INDICATIONS:Patient's personal history of adenomatous colon polyps.  MEDICATIONS: Propofol (Diprivan) 230 mg IV  DESCRIPTION OF PROCEDURE:   After the risks and benefits and of the procedure were explained, informed consent was obtained.  A digital rectal exam revealed no abnormalities of the rectum.    The LB [id number]  endoscope was introduced through the anus and advanced to the cecum, which was identified by both the appendix and ileocecal valve .  The quality of the prep was good, using MoviPrep .  The instrument was then slowly withdrawn as the colon was fully examined.     COLON FINDINGS: Nine polypoid shaped and smooth sessile polyps ranging between 5-80mm in size were found in the right colon.  A polypectomy was performed using snare cautery and with cold forceps.  The resection was complete and the polyp tissue was completely retrieved.   Moderate diverticulosis was noted in the descending colon and sigmoid colon.     Retroflexed views revealed no abnormalities.     The scope was then withdrawn from the patient and the procedure completed.  COMPLICATIONS: There were no complications. ENDOSCOPIC IMPRESSION: 1.   Nine sessile polyps ranging between 5-28mm in size were found in the left colon; polypectomy was performed using snare cautery and with cold forceps ...these are probable serrated adenomas... 2.   Moderate diverticulosis was noted in the descending colon and sigmoid colon  RECOMMENDATIONS: 1.  Await biopsy results 2.  High fiber diet with liberal fluid intake. 3.  Repeat  Colonoscopy in 1 year.   REPEAT EXAM:  cc:  _______________________________ eSignedMardella Layman, MD, [facility] 09/07/2012 2:52 PM     PATIENT NAME:  Andrew Ramos MR#: [id number]

## 2012-09-07 NOTE — Patient Instructions (Addendum)

## 2012-09-07 NOTE — Progress Notes (Signed)
Patient did not experience any of the following events: a burn prior to discharge; a fall within the facility; wrong site/side/patient/procedure/implant event; or a hospital transfer or hospital admission upon discharge from the facility. (G8907) Patient did not have preoperative order for IV antibiotic SSI prophylaxis. (G8918)  

## 2012-09-10 ENCOUNTER — Telehealth: Payer: Self-pay

## 2012-09-10 NOTE — Telephone Encounter (Signed)
  Follow up Call-  Call back number 09/07/2012  Post procedure Call Back phone  # [phone]  Permission to leave phone message Yes     Patient questions:  Do you have a fever, pain , or abdominal swelling? no Pain Score  0 *  Have you tolerated food without any problems? yes  Have you been able to return to your normal activities? yes  Do you have any questions about your discharge instructions: Diet   no Medications  no Follow up visit  no  Do you have questions or concerns about your Care? no  Actions: * If pain score is 4 or above: No action needed, pain <4.

## 2012-09-18 ENCOUNTER — Encounter: Payer: Self-pay | Admitting: Gastroenterology

## 2012-09-25 DIAGNOSIS — Z85820 Personal history of malignant melanoma of skin: Secondary | ICD-10-CM | POA: Diagnosis not present

## 2012-09-25 DIAGNOSIS — D235 Other benign neoplasm of skin of trunk: Secondary | ICD-10-CM | POA: Diagnosis not present

## 2012-09-25 DIAGNOSIS — Z85828 Personal history of other malignant neoplasm of skin: Secondary | ICD-10-CM | POA: Diagnosis not present

## 2012-09-25 DIAGNOSIS — D485 Neoplasm of uncertain behavior of skin: Secondary | ICD-10-CM | POA: Diagnosis not present

## 2012-09-25 DIAGNOSIS — L570 Actinic keratosis: Secondary | ICD-10-CM | POA: Diagnosis not present

## 2012-09-25 DIAGNOSIS — D239 Other benign neoplasm of skin, unspecified: Secondary | ICD-10-CM | POA: Diagnosis not present

## 2012-11-12 DIAGNOSIS — M810 Age-related osteoporosis without current pathological fracture: Secondary | ICD-10-CM | POA: Diagnosis not present

## 2012-11-12 DIAGNOSIS — M159 Polyosteoarthritis, unspecified: Secondary | ICD-10-CM | POA: Diagnosis not present

## 2012-11-12 DIAGNOSIS — M353 Polymyalgia rheumatica: Secondary | ICD-10-CM | POA: Diagnosis not present

## 2012-11-12 DIAGNOSIS — M76899 Other specified enthesopathies of unspecified lower limb, excluding foot: Secondary | ICD-10-CM | POA: Diagnosis not present

## 2012-12-03 ENCOUNTER — Other Ambulatory Visit: Payer: Self-pay | Admitting: Family Medicine

## 2012-12-03 DIAGNOSIS — E042 Nontoxic multinodular goiter: Secondary | ICD-10-CM | POA: Diagnosis not present

## 2012-12-03 NOTE — Telephone Encounter (Signed)
Last seen 07/24/12 and filled 03/24/12 #90. Please advise     KP

## 2012-12-06 DIAGNOSIS — E042 Nontoxic multinodular goiter: Secondary | ICD-10-CM | POA: Diagnosis not present

## 2013-01-15 DIAGNOSIS — H40009 Preglaucoma, unspecified, unspecified eye: Secondary | ICD-10-CM | POA: Diagnosis not present

## 2013-01-15 DIAGNOSIS — H524 Presbyopia: Secondary | ICD-10-CM | POA: Diagnosis not present

## 2013-01-15 DIAGNOSIS — H2510 Age-related nuclear cataract, unspecified eye: Secondary | ICD-10-CM | POA: Diagnosis not present

## 2013-01-15 DIAGNOSIS — H43819 Vitreous degeneration, unspecified eye: Secondary | ICD-10-CM | POA: Diagnosis not present

## 2013-02-19 DIAGNOSIS — M76899 Other specified enthesopathies of unspecified lower limb, excluding foot: Secondary | ICD-10-CM | POA: Diagnosis not present

## 2013-02-19 DIAGNOSIS — M069 Rheumatoid arthritis, unspecified: Secondary | ICD-10-CM | POA: Diagnosis not present

## 2013-02-19 DIAGNOSIS — M79609 Pain in unspecified limb: Secondary | ICD-10-CM | POA: Diagnosis not present

## 2013-02-19 DIAGNOSIS — M353 Polymyalgia rheumatica: Secondary | ICD-10-CM | POA: Diagnosis not present

## 2013-02-19 DIAGNOSIS — M2550 Pain in unspecified joint: Secondary | ICD-10-CM | POA: Diagnosis not present

## 2013-02-19 DIAGNOSIS — M159 Polyosteoarthritis, unspecified: Secondary | ICD-10-CM | POA: Diagnosis not present

## 2013-04-10 DIAGNOSIS — M159 Polyosteoarthritis, unspecified: Secondary | ICD-10-CM | POA: Diagnosis not present

## 2013-04-10 DIAGNOSIS — M76899 Other specified enthesopathies of unspecified lower limb, excluding foot: Secondary | ICD-10-CM | POA: Diagnosis not present

## 2013-04-10 DIAGNOSIS — M353 Polymyalgia rheumatica: Secondary | ICD-10-CM | POA: Diagnosis not present

## 2013-04-10 DIAGNOSIS — M069 Rheumatoid arthritis, unspecified: Secondary | ICD-10-CM | POA: Diagnosis not present

## 2013-05-09 ENCOUNTER — Other Ambulatory Visit: Payer: Self-pay | Admitting: Dermatology

## 2013-05-09 DIAGNOSIS — D485 Neoplasm of uncertain behavior of skin: Secondary | ICD-10-CM | POA: Diagnosis not present

## 2013-05-09 DIAGNOSIS — Z85828 Personal history of other malignant neoplasm of skin: Secondary | ICD-10-CM | POA: Diagnosis not present

## 2013-05-09 DIAGNOSIS — Z85820 Personal history of malignant melanoma of skin: Secondary | ICD-10-CM | POA: Diagnosis not present

## 2013-05-09 DIAGNOSIS — L259 Unspecified contact dermatitis, unspecified cause: Secondary | ICD-10-CM | POA: Diagnosis not present

## 2013-05-09 DIAGNOSIS — L821 Other seborrheic keratosis: Secondary | ICD-10-CM | POA: Diagnosis not present

## 2013-05-09 DIAGNOSIS — L570 Actinic keratosis: Secondary | ICD-10-CM | POA: Diagnosis not present

## 2013-05-10 ENCOUNTER — Encounter: Payer: Self-pay | Admitting: Family Medicine

## 2013-05-10 ENCOUNTER — Ambulatory Visit (INDEPENDENT_AMBULATORY_CARE_PROVIDER_SITE_OTHER): Payer: Medicare Other | Admitting: Family Medicine

## 2013-05-10 VITALS — BP 122/76 | HR 94 | Temp 98.8°F | Wt 241.8 lb

## 2013-05-10 DIAGNOSIS — R52 Pain, unspecified: Secondary | ICD-10-CM | POA: Diagnosis not present

## 2013-05-10 DIAGNOSIS — R1032 Left lower quadrant pain: Secondary | ICD-10-CM

## 2013-05-10 DIAGNOSIS — R319 Hematuria, unspecified: Secondary | ICD-10-CM | POA: Diagnosis not present

## 2013-05-10 DIAGNOSIS — E669 Obesity, unspecified: Secondary | ICD-10-CM | POA: Insufficient documentation

## 2013-05-10 LAB — CBC WITH DIFFERENTIAL/PLATELET
Basophils Absolute: 0 10*3/uL (ref 0.0–0.1)
Eosinophils Absolute: 0.2 10*3/uL (ref 0.0–0.7)
Eosinophils Relative: 3 % (ref 0–5)
HCT: 39.2 % (ref 39.0–52.0)
Lymphocytes Relative: 18 % (ref 12–46)
MCH: 31.5 pg (ref 26.0–34.0)
MCV: 92.2 fL (ref 78.0–100.0)
Monocytes Absolute: 0.3 10*3/uL (ref 0.1–1.0)
Platelets: 256 10*3/uL (ref 150–400)
RDW: 15 % (ref 11.5–15.5)
WBC: 6.3 10*3/uL (ref 4.0–10.5)

## 2013-05-10 LAB — POCT URINALYSIS DIPSTICK
Glucose, UA: NEGATIVE
Leukocytes, UA: NEGATIVE
Nitrite, UA: NEGATIVE
Spec Grav, UA: 1.015
Urobilinogen, UA: 0.2

## 2013-05-10 MED ORDER — CIPROFLOXACIN HCL 500 MG PO TABS: 500.0000 mg | ORAL_TABLET | Freq: Two times a day (BID) | ORAL | Status: DC

## 2013-05-10 MED ORDER — METRONIDAZOLE 500 MG PO TABS: ORAL_TABLET | ORAL | Status: DC

## 2013-05-10 NOTE — Patient Instructions (Addendum)
Diverticulitis °A diverticulum is a small pouch or sac on the colon. Diverticulosis is the presence of these diverticula on the colon. Diverticulitis is the irritation (inflammation) or infection of diverticula. °CAUSES  °The colon and its diverticula contain bacteria. If food particles block the tiny opening to a diverticulum, the bacteria inside can grow and cause an increase in pressure. This leads to infection and inflammation and is called diverticulitis. °SYMPTOMS  °· Abdominal pain and tenderness. Usually, the pain is located on the left side of your abdomen. However, it could be located elsewhere. °· Fever. °· Bloating. °· Feeling sick to your stomach (nausea). °· Throwing up (vomiting). °· Abnormal stools. °DIAGNOSIS  °Your caregiver will take a history and perform a physical exam. Since many things can cause abdominal pain, other tests may be necessary. Tests may include: °· Blood tests. °· Urine tests. °· X-ray of the abdomen. °· CT scan of the abdomen. °Sometimes, surgery is needed to determine if diverticulitis or other conditions are causing your symptoms. °TREATMENT  °Most of the time, you can be treated without surgery. Treatment includes: °· Resting the bowels by only having liquids for a few days. As you improve, you will need to eat a low-fiber diet. °· Intravenous (IV) fluids if you are losing body fluids (dehydrated). °· Antibiotic medicines that treat infections may be given. °· Pain and nausea medicine, if needed. °· Surgery if the inflamed diverticulum has burst. °HOME CARE INSTRUCTIONS  °· Try a clear liquid diet (broth, tea, or water for as long as directed by your caregiver). You may then gradually begin a low-fiber diet as tolerated.  °A low-fiber diet is a diet with less than 10 grams of fiber. Choose the foods below to reduce fiber in the diet: °· White breads, cereals, rice, and pasta. °· Cooked fruits and vegetables or soft fresh fruits and vegetables without the skin. °· Ground or  well-cooked tender beef, ham, veal, lamb, pork, or poultry. °· Eggs and seafood. °· After your diverticulitis symptoms have improved, your caregiver may put you on a high-fiber diet. A high-fiber diet includes 14 grams of fiber for every 1000 calories consumed. For a standard 2000 calorie diet, you would need 28 grams of fiber. Follow these diet guidelines to help you increase the fiber in your diet. It is important to slowly increase the amount fiber in your diet to avoid gas, constipation, and bloating. °· Choose whole-grain breads, cereals, pasta, and brown rice. °· Choose fresh fruits and vegetables with the skin on. Do not overcook vegetables because the more vegetables are cooked, the more fiber is lost. °· Choose more nuts, seeds, legumes, dried peas, beans, and lentils. °· Look for food products that have greater than 3 grams of fiber per serving on the Nutrition Facts label. °· Take all medicine as directed by your caregiver. °· If your caregiver has given you a follow-up appointment, it is very important that you go. Not going could result in lasting (chronic) or permanent injury, pain, and disability. If there is any problem keeping the appointment, call to reschedule. °SEEK MEDICAL CARE IF:  °· Your pain does not improve. °· You have a hard time advancing your diet beyond clear liquids. °· Your bowel movements do not return to normal. °SEEK IMMEDIATE MEDICAL CARE IF:  °· Your pain becomes worse. °· You have an oral temperature above 102° F (38.9° C), not controlled by medicine. °· You have repeated vomiting. °· You have bloody or black, tarry stools. °·   Symptoms that brought you to your caregiver become worse or are not getting better. °MAKE SURE YOU:  °· Understand these instructions. °· Will watch your condition. °· Will get help right away if you are not doing well or get worse. °Document Released: 06/15/2005 Document Revised: 11/28/2011 Document Reviewed: 10/11/2010 °[employer]® Patient Information  ©2014 [employer]. ° °

## 2013-05-11 ENCOUNTER — Encounter: Payer: Self-pay | Admitting: Family Medicine

## 2013-05-11 LAB — BASIC METABOLIC PANEL
BUN: 24 mg/dL — ABNORMAL HIGH (ref 6–23)
CO2: 25 mEq/L (ref 19–32)
Calcium: 10.1 mg/dL (ref 8.4–10.5)
Chloride: 103 mEq/L (ref 96–112)
Creat: 1.28 mg/dL (ref 0.50–1.35)

## 2013-05-11 LAB — HEPATIC FUNCTION PANEL
ALT: 14 U/L (ref 0–53)
Albumin: 4.1 g/dL (ref 3.5–5.2)
Indirect Bilirubin: 0.6 mg/dL (ref 0.0–0.9)
Total Protein: 6.4 g/dL (ref 6.0–8.3)

## 2013-05-11 NOTE — Progress Notes (Signed)
  Subjective:     Andrew Ramos is a 79 y.o. male who presents for evaluation of abdominal pain. Onset was several days ago. Symptoms have been unchanged. The pain is described as sharp.. Pain is located in the LLQ without radiation.  Aggravating factors: activity.  Alleviating factors: none. Associated symptoms: none. The patient denies anorexia, belching, chills, constipation, diarrhea, dysuria, fever, flatus, frequency, headache, hematochezia, hematuria, melena, nausea, sweats and vomiting.  The patient's history has been marked as reviewed and updated as appropriate.  Review of Systems Pertinent items are noted in HPI.     Objective:    BP 122/76  Pulse 94  Temp(Src) 98.8 F (37.1 C) (Oral)  Wt 241 lb 12.8 oz (109.68 kg)  BMI 32.79 kg/m2  SpO2 97% General appearance: alert and no distress Neck: no adenopathy, supple, symmetrical, trachea midline and thyroid not enlarged, symmetric, no tenderness/mass/nodules Lungs: clear to auscultation bilaterally Heart: S1, S2 normal Abdomen: abnormal findings:  marked tenderness in the LLQ with palpation.  Nondistended, + BS, soft Rectal: soft brown guaiac negative stool noted    Assessment:    Abdominal pain, likely secondary to ? diverticulitis .    Plan:    The diagnosis was discussed with the patient and evaluation and treatment plans outlined. See orders for lab and imaging studies. Follow up as needed. if pain worsens pt instructed to go to ER  Flagyl and cipro

## 2013-05-12 LAB — URINE CULTURE
Colony Count: NO GROWTH
Organism ID, Bacteria: NO GROWTH

## 2013-05-17 DIAGNOSIS — M25559 Pain in unspecified hip: Secondary | ICD-10-CM | POA: Diagnosis not present

## 2013-05-17 DIAGNOSIS — M76899 Other specified enthesopathies of unspecified lower limb, excluding foot: Secondary | ICD-10-CM | POA: Diagnosis not present

## 2013-05-22 DIAGNOSIS — M25559 Pain in unspecified hip: Secondary | ICD-10-CM | POA: Diagnosis not present

## 2013-05-22 DIAGNOSIS — M169 Osteoarthritis of hip, unspecified: Secondary | ICD-10-CM | POA: Diagnosis not present

## 2013-05-22 DIAGNOSIS — M545 Low back pain: Secondary | ICD-10-CM | POA: Diagnosis not present

## 2013-05-27 DIAGNOSIS — M48061 Spinal stenosis, lumbar region without neurogenic claudication: Secondary | ICD-10-CM | POA: Diagnosis not present

## 2013-05-28 ENCOUNTER — Other Ambulatory Visit: Payer: Self-pay | Admitting: Family Medicine

## 2013-06-01 ENCOUNTER — Other Ambulatory Visit: Payer: Self-pay | Admitting: Family Medicine

## 2013-06-03 DIAGNOSIS — M76899 Other specified enthesopathies of unspecified lower limb, excluding foot: Secondary | ICD-10-CM | POA: Diagnosis not present

## 2013-06-03 DIAGNOSIS — M48061 Spinal stenosis, lumbar region without neurogenic claudication: Secondary | ICD-10-CM | POA: Diagnosis not present

## 2013-06-05 ENCOUNTER — Other Ambulatory Visit: Payer: Self-pay | Admitting: Dermatology

## 2013-06-05 DIAGNOSIS — D485 Neoplasm of uncertain behavior of skin: Secondary | ICD-10-CM | POA: Diagnosis not present

## 2013-06-05 DIAGNOSIS — D0420 Carcinoma in situ of skin of unspecified ear and external auricular canal: Secondary | ICD-10-CM | POA: Diagnosis not present

## 2013-06-06 ENCOUNTER — Other Ambulatory Visit: Payer: Self-pay | Admitting: Orthopedic Surgery

## 2013-06-06 DIAGNOSIS — M79605 Pain in left leg: Secondary | ICD-10-CM

## 2013-06-06 DIAGNOSIS — M48061 Spinal stenosis, lumbar region without neurogenic claudication: Secondary | ICD-10-CM

## 2013-06-12 DIAGNOSIS — M810 Age-related osteoporosis without current pathological fracture: Secondary | ICD-10-CM | POA: Diagnosis not present

## 2013-06-12 DIAGNOSIS — M159 Polyosteoarthritis, unspecified: Secondary | ICD-10-CM | POA: Diagnosis not present

## 2013-06-12 DIAGNOSIS — M353 Polymyalgia rheumatica: Secondary | ICD-10-CM | POA: Diagnosis not present

## 2013-06-12 DIAGNOSIS — M069 Rheumatoid arthritis, unspecified: Secondary | ICD-10-CM | POA: Diagnosis not present

## 2013-06-20 ENCOUNTER — Ambulatory Visit
Admission: RE | Admit: 2013-06-20 | Discharge: 2013-06-20 | Disposition: A | Discharge status: Home / Self Care | Payer: Medicare Other | Source: Ambulatory Visit | Attending: Orthopedic Surgery | Admitting: Orthopedic Surgery

## 2013-06-20 VITALS — BP 136/66 | HR 50

## 2013-06-20 DIAGNOSIS — M79605 Pain in left leg: Secondary | ICD-10-CM

## 2013-06-20 DIAGNOSIS — M48061 Spinal stenosis, lumbar region without neurogenic claudication: Secondary | ICD-10-CM | POA: Diagnosis not present

## 2013-06-20 DIAGNOSIS — M5137 Other intervertebral disc degeneration, lumbosacral region: Secondary | ICD-10-CM | POA: Diagnosis not present

## 2013-06-20 DIAGNOSIS — M47817 Spondylosis without myelopathy or radiculopathy, lumbosacral region: Secondary | ICD-10-CM | POA: Diagnosis not present

## 2013-06-20 MED ORDER — DIAZEPAM 5 MG PO TABS
5.0000 mg | ORAL_TABLET | Freq: Once | ORAL | Status: AC
  Administered 2013-06-20: 5 mg via ORAL

## 2013-06-20 MED ORDER — IOHEXOL 180 MG/ML  SOLN
15.0000 mL | Freq: Once | INTRAMUSCULAR | Status: AC | PRN
  Administered 2013-06-20: 15 mL via INTRATHECAL

## 2013-07-05 DIAGNOSIS — M76899 Other specified enthesopathies of unspecified lower limb, excluding foot: Secondary | ICD-10-CM | POA: Diagnosis not present

## 2013-07-10 DIAGNOSIS — C61 Malignant neoplasm of prostate: Secondary | ICD-10-CM | POA: Diagnosis not present

## 2013-07-17 DIAGNOSIS — C61 Malignant neoplasm of prostate: Secondary | ICD-10-CM | POA: Diagnosis not present

## 2013-07-17 DIAGNOSIS — N401 Enlarged prostate with lower urinary tract symptoms: Secondary | ICD-10-CM | POA: Diagnosis not present

## 2013-07-25 ENCOUNTER — Other Ambulatory Visit: Payer: Self-pay

## 2013-08-08 ENCOUNTER — Telehealth: Payer: Self-pay

## 2013-08-08 NOTE — Telephone Encounter (Addendum)
LM with spouse for CB Medication List and allergies: reviewed and updated  90 day supply/mail order: [facility] Local prescriptions: [facility]  Immunizations due: Admin Flu Vaccine today  A/P:   No changes to FH or PSH CCS--08/2012--adenomatous polyps--recheck 1 year PSA--05/2012--0.17 Health Maintenance  Topic Date Due  . Zostavax  01/26/1994  . Tetanus/tdap  10/02/2012  . Influenza Vaccine  04/19/2013  . Colonoscopy  09/07/2013  . Pneumococcal Polysaccharide Vaccine Age 77 And Over  Completed   To Discuss with Provider: Second PNA vaccine

## 2013-08-09 ENCOUNTER — Encounter: Payer: Self-pay | Admitting: Family Medicine

## 2013-08-09 ENCOUNTER — Ambulatory Visit (INDEPENDENT_AMBULATORY_CARE_PROVIDER_SITE_OTHER): Payer: Medicare Other | Admitting: Family Medicine

## 2013-08-09 VITALS — BP 128/80 | HR 57 | Temp 98.4°F | Ht 72.0 in | Wt 240.0 lb

## 2013-08-09 DIAGNOSIS — R739 Hyperglycemia, unspecified: Secondary | ICD-10-CM

## 2013-08-09 DIAGNOSIS — R7309 Other abnormal glucose: Secondary | ICD-10-CM | POA: Diagnosis not present

## 2013-08-09 DIAGNOSIS — E785 Hyperlipidemia, unspecified: Secondary | ICD-10-CM | POA: Diagnosis not present

## 2013-08-09 DIAGNOSIS — Z0000 Encounter for general adult medical examination without abnormal findings: Secondary | ICD-10-CM | POA: Diagnosis not present

## 2013-08-09 DIAGNOSIS — I10 Essential (primary) hypertension: Secondary | ICD-10-CM | POA: Diagnosis not present

## 2013-08-09 DIAGNOSIS — M353 Polymyalgia rheumatica: Secondary | ICD-10-CM

## 2013-08-09 DIAGNOSIS — Z8546 Personal history of malignant neoplasm of prostate: Secondary | ICD-10-CM

## 2013-08-09 LAB — LIPID PANEL
HDL: 45.7 mg/dL (ref >39.00)
LDL Cholesterol: 46 mg/dL (ref 0–99)
Total CHOL/HDL Ratio: 2
Triglycerides: 64 mg/dL (ref 0.0–149.0)

## 2013-08-09 LAB — CBC WITH DIFFERENTIAL/PLATELET
Basophils Relative: 0.3 % (ref 0.0–3.0)
Eosinophils Absolute: 0 10*3/uL (ref 0.0–0.7)
Eosinophils Relative: 0.8 % (ref 0.0–5.0)
Lymphocytes Relative: 21.6 % (ref 12.0–46.0)
MCV: 98.4 fl (ref 78.0–100.0)
Monocytes Absolute: 0.3 10*3/uL (ref 0.1–1.0)
Monocytes Relative: 5.6 % (ref 3.0–12.0)
Neutrophils Relative %: 71.7 % (ref 43.0–77.0)
Platelets: 187 10*3/uL (ref 150.0–400.0)
RBC: 4.15 Mil/uL — ABNORMAL LOW (ref 4.22–5.81)
WBC: 5.1 10*3/uL (ref 4.5–10.5)

## 2013-08-09 LAB — HEPATIC FUNCTION PANEL
Alkaline Phosphatase: 42 U/L (ref 39–117)
Bilirubin, Direct: 0.2 mg/dL (ref 0.0–0.3)
Total Bilirubin: 1.2 mg/dL (ref 0.3–1.2)

## 2013-08-09 LAB — BASIC METABOLIC PANEL
BUN: 20 mg/dL (ref 6–23)
Calcium: 9.6 mg/dL (ref 8.4–10.5)
Chloride: 101 mEq/L (ref 96–112)
Creatinine, Ser: 1.1 mg/dL (ref 0.4–1.5)
Glucose, Bld: 106 mg/dL — ABNORMAL HIGH (ref 70–99)

## 2013-08-09 NOTE — Progress Notes (Signed)
Subjective:    Andrew Ramos is a 79 y.o. male who presents for Medicare Annual/Subsequent preventive examination.   Preventive Screening-Counseling & Management  Tobacco History  Smoking status  . Never Smoker   Smokeless tobacco  . Never Used    Problems Prior to Visit 1.   Current Problems (verified) Patient Active Problem List   Diagnosis Date Noted  . Obesity (BMI 30-39.9) 05/10/2013  . Obese 06/14/2012  . S/P right THA, AA 06/12/2012  . PMR (polymyalgia rheumatica) 05/10/2011  . EXTERNAL HEMORRHOIDS 06/09/2010  . DIVERTICULOSIS, COLON 06/09/2010  . COLONIC POLYPS, HX OF 06/09/2010  . UNSPECIFIED VITAMIN D DEFICIENCY 05/18/2010  . TOTAL KNEE REPLACEMENT, LEFT, HX OF 01/22/2009  . Unspecified Hearing Loss 03/13/2008  . THYROID NODULE, HX OF 08/14/2007  . PROSTATE CANCER, HX OF 06/18/2007  . HYPERLIPIDEMIA 02/28/2007  . HYPERTENSION 02/28/2007  . OSTEOARTHRITIS 02/28/2007    Medications Prior to Visit Current Outpatient Prescriptions on File Prior to Visit  Medication Sig Dispense Refill  . alendronate (FOSAMAX) 70 MG tablet Take 70 mg by mouth every 7 (seven) days. Take with a full glass of water on an empty stomach every Friday      . aspirin EC 325 MG tablet Take 1 tablet (325 mg total) by mouth 2 (two) times daily. X 4 weeks  60 tablet  0  . Calcium Carbonate-Vitamin D (CALCIUM 600 + D PO) Take 1 tablet by mouth every morning.       . Cholecalciferol (VITAMIN D3) 1000 UNITS tablet Take 1,000 Units by mouth every morning.       . methotrexate (RHEUMATREX) 2.5 MG tablet Take 15 mg by mouth once a week.      . Multiple Vitamins-Minerals (CENTRUM SILVER ULTRA MENS) TABS Take 1 tablet by mouth every evening.       Marland Kitchen PREDNISONE PO Take by mouth. 5 mg dose daily- sometimes decreases to 3 mg      . rosuvastatin (CRESTOR) 20 MG tablet 1 tab by mouth daily--Lipid panel due now  90 tablet  0  . terazosin (HYTRIN) 1 MG capsule TAKE 1 CAPSULE AT BEDTIME  90 capsule  1   . ZESTORETIC 10-12.5 MG per tablet TAKE 1 TABLET BY MOUTH DAILY  90 tablet  1  . docusate sodium 100 MG CAPS Take 100 mg by mouth 2 (two) times daily.  10 capsule     No current facility-administered medications on file prior to visit.    Current Medications (verified) Current Outpatient Prescriptions  Medication Sig Dispense Refill  . alendronate (FOSAMAX) 70 MG tablet Take 70 mg by mouth every 7 (seven) days. Take with a full glass of water on an empty stomach every Friday      . aspirin EC 325 MG tablet Take 1 tablet (325 mg total) by mouth 2 (two) times daily. X 4 weeks  60 tablet  0  . Calcium Carbonate-Vitamin D (CALCIUM 600 + D PO) Take 1 tablet by mouth every morning.       . Cholecalciferol (VITAMIN D3) 1000 UNITS tablet Take 1,000 Units by mouth every morning.       . methotrexate (RHEUMATREX) 2.5 MG tablet Take 15 mg by mouth once a week.      . Multiple Vitamins-Minerals (CENTRUM SILVER ULTRA MENS) TABS Take 1 tablet by mouth every evening.       Marland Kitchen PREDNISONE PO Take by mouth. 5 mg dose daily- sometimes decreases to 3 mg      .  rosuvastatin (CRESTOR) 20 MG tablet 1 tab by mouth daily--Lipid panel due now  90 tablet  0  . terazosin (HYTRIN) 1 MG capsule TAKE 1 CAPSULE AT BEDTIME  90 capsule  1  . ZESTORETIC 10-12.5 MG per tablet TAKE 1 TABLET BY MOUTH DAILY  90 tablet  1  . docusate sodium 100 MG CAPS Take 100 mg by mouth 2 (two) times daily.  10 capsule     No current facility-administered medications for this visit.     Allergies (verified) Review of patient's allergies indicates no known allergies.   PAST HISTORY  Family History Family History  Problem Relation Age of Onset  . Coronary artery disease Mother 34  . Heart disease Mother 28    MI  . Coronary artery disease Father 21  . Pancreatic cancer Father   . Cancer Father     pancreatic  . Pancreatic cancer Brother 42  . Cancer Brother     pancreatic  . Colon cancer Neg Hx   . Esophageal cancer Neg Hx   .  Stomach cancer Neg Hx   . Rectal cancer Neg Hx     Social History History  Substance Use Topics  . Smoking status: Never Smoker   . Smokeless tobacco: Never Used  . Alcohol Use: Yes     Comment: very occasional    Are there smokers in your home (other than you)?  No  Risk Factors Current exercise habits: walking  Dietary issues discussed: na   Cardiac risk factors: advanced age (older than 48 for men, 80 for women), dyslipidemia, hypertension, male gender, obesity (BMI >= 30 kg/m2) and sedentary lifestyle.  Depression Screen (Note: if answer to either of the following is "Yes", a more complete depression screening is indicated)   Q1: Over the past two weeks, have you felt down, depressed or hopeless? No  Q2: Over the past two weeks, have you felt little interest or pleasure in doing things? No  Have you lost interest or pleasure in daily life? No  Do you often feel hopeless? No  Do you cry easily over simple problems? No  Activities of Daily Living In your present state of health, do you have any difficulty performing the following activities?:  Driving? No Managing money?  No Feeding yourself? No Getting from bed to chair? No Climbing a flight of stairs? No Preparing food and eating?: No Bathing or showering? No Getting dressed: No Getting to the toilet? No Using the toilet:No Moving around from place to place: No In the past year have you fallen or had a near fall?:No   Are you sexually active?  Yes  Do you have more than one partner?  No  Hearing Difficulties: Yes--- pt has hearing aids Do you often ask people to speak up or repeat themselves? Yes Do you experience ringing or noises in your ears? No Do you have difficulty understanding soft or whispered voices? Yes   Do you feel that you have a problem with memory? No  Do you often misplace items? No  Do you feel safe at home?  Yes  Cognitive Testing  Alert? Yes  Normal Appearance?Yes  Oriented to person?  Yes  Place? Yes   Time? Yes  Recall of three objects?  Yes  Can perform simple calculations? Yes  Displays appropriate judgment?Yes  Can read the correct time from a watch face?Yes   Advanced Directives have been discussed with the patient? Yes   List the Names of Other Physician/Practitioners  you currently use: 1.  Rheum-- Dr Dierdre Forth 2.  uro-- borden 3.   Derm- gould 4.  GI -Patterson 5.  Ortho--shaw 6.  Dentist---jones  Indicate any recent Medical Services you may have received from other than [facility] providers in the past year (date may be approximate).  Immunization History  Administered Date(s) Administered  . H1N1 10/21/2008  . Influenza Split 07/13/2011, 07/11/2012  . Influenza Whole 06/25/2008, 06/15/2009, 06/15/2011  . Pneumococcal Polysaccharide-23 09/26/2001  . Td 10/02/2002    Screening Tests Health Maintenance  Topic Date Due  . Zostavax  01/26/1994  . Tetanus/tdap  10/02/2012  . Influenza Vaccine  04/19/2013  . Colonoscopy  09/07/2013  . Pneumococcal Polysaccharide Vaccine Age 34 And Over  Completed    All answers were reviewed with the patient and necessary referrals were made:  Loreen Freud, DO   08/09/2013   History reviewed:  He  has a past medical history of Hypertension; Hyperlipidemia; Colon polyps; Thyroid disease (sees dr altzhimer for yearly); Prostate cancer (2007); Polymyalgia rheumatica; Hepatitis; and Osteoarthritis. He  does not have any pertinent problems on file. He  has past surgical history that includes Total knee arthroplasty (12/31/2008); Total hip arthroplasty (08/27/2008); Tonsillectomy (as child); Total hip arthroplasty (06/12/2012); Colonoscopy; and Polypectomy. His family history includes Cancer in his brother and father; Coronary artery disease (age of onset: 55) in his mother; Coronary artery disease (age of onset: 42) in his father; Heart disease (age of onset: 16) in his mother; Pancreatic cancer in his father; Pancreatic cancer  (age of onset: 58) in his brother. There is no history of Colon cancer, Esophageal cancer, Stomach cancer, or Rectal cancer. He  reports that he has never smoked. He has never used smokeless tobacco. He reports that he drinks alcohol. He reports that he does not use illicit drugs. He has a current medication list which includes the following prescription(s): alendronate, aspirin ec, calcium carb-cholecalciferol, cholecalciferol, methotrexate, centrum silver ultra mens, prednisone, rosuvastatin, terazosin, zestoretic, and dss. Current Outpatient Prescriptions on File Prior to Visit  Medication Sig Dispense Refill  . alendronate (FOSAMAX) 70 MG tablet Take 70 mg by mouth every 7 (seven) days. Take with a full glass of water on an empty stomach every Friday      . aspirin EC 325 MG tablet Take 1 tablet (325 mg total) by mouth 2 (two) times daily. X 4 weeks  60 tablet  0  . Calcium Carbonate-Vitamin D (CALCIUM 600 + D PO) Take 1 tablet by mouth every morning.       . Cholecalciferol (VITAMIN D3) 1000 UNITS tablet Take 1,000 Units by mouth every morning.       . methotrexate (RHEUMATREX) 2.5 MG tablet Take 15 mg by mouth once a week.      . Multiple Vitamins-Minerals (CENTRUM SILVER ULTRA MENS) TABS Take 1 tablet by mouth every evening.       Marland Kitchen PREDNISONE PO Take by mouth. 5 mg dose daily- sometimes decreases to 3 mg      . rosuvastatin (CRESTOR) 20 MG tablet 1 tab by mouth daily--Lipid panel due now  90 tablet  0  . terazosin (HYTRIN) 1 MG capsule TAKE 1 CAPSULE AT BEDTIME  90 capsule  1  . ZESTORETIC 10-12.5 MG per tablet TAKE 1 TABLET BY MOUTH DAILY  90 tablet  1  . docusate sodium 100 MG CAPS Take 100 mg by mouth 2 (two) times daily.  10 capsule     No current facility-administered medications on file  prior to visit.   He has No Known Allergies.  Review of Systems  Review of Systems  Constitutional: Negative for activity change, appetite change and fatigue.  HENT: Negative for , congestion,  tinnitus and ear discharge.  + hearing loss--has hearing aids Eyes: Negative for visual disturbance (see optho q1y -- vision corrected to 20/20 with glasses).  Respiratory: Negative for cough, chest tightness and shortness of breath.   Cardiovascular: Negative for chest pain, palpitations and leg swelling.  Gastrointestinal: Negative for abdominal pain, diarrhea, constipation and abdominal distention.  Genitourinary: Negative for urgency, frequency, decreased urine volume and difficulty urinating.  Musculoskeletal: Negative for back pain, arthralgias and gait problem.  Skin: Negative for color change, pallor and rash.  Neurological: Negative for dizziness, light-headedness, numbness and headaches.  Hematological: Negative for adenopathy. Does not bruise/bleed easily.  Psychiatric/Behavioral: Negative for suicidal ideas, confusion, sleep disturbance, self-injury, dysphoric mood, decreased concentration and agitation.  Pt is able to read and write and can do all ADLs No risk for falling No abuse/ violence in home      Objective:     Vision by Snellen chart: opth Blood pressure 128/80, pulse 57, temperature 98.4 F (36.9 C), temperature source Oral, height 6' (1.829 m), weight 240 lb (108.863 kg), SpO2 96.00%. Body mass index is 32.54 kg/(m^2).  BP 128/80  Pulse 57  Temp(Src) 98.4 F (36.9 C) (Oral)  Ht 6' (1.829 m)  Wt 240 lb (108.863 kg)  BMI 32.54 kg/m2  SpO2 96% General appearance: alert, cooperative, appears stated age and no distress Head: Normocephalic, without obvious abnormality, atraumatic Eyes: conjunctivae/corneas clear. PERRL, EOM&#39;s intact. Fundi benign. Ears: normal TM's and external ear canals both ears Nose: Nares normal. Septum midline. Mucosa normal. No drainage or sinus tenderness. Throat: lips, mucosa, and tongue normal; teeth and gums normal Neck: no adenopathy, no carotid bruit, no JVD, supple, symmetrical, trachea midline and thyroid not enlarged,  symmetric, no tenderness/mass/nodules Back: symmetric, no curvature. ROM normal. No CVA tenderness. Lungs: clear to auscultation bilaterally Chest wall: no tenderness Heart: S1, S2 normal Abdomen: soft, non-tender; bowel sounds normal; no masses,  no organomegaly Male genitalia: normal, deferred--urology Rectal: deferred --urology Extremities: extremities normal, atraumatic, no cyanosis or edema Pulses: 2+ and symmetric Skin: Skin color, texture, turgor normal. No rashes or lesions Lymph nodes: Cervical, supraclavicular, and axillary nodes normal. Neurologic: Alert and oriented X 3, normal strength and tone. Normal symmetric reflexes. Normal coordination and gait Psych-- no depression, no anxiety      Assessment:     cpe      Plan:    ghm utd Check labs During the course of the visit the patient was educated and counseled about appropriate screening and preventive services including:    Pneumococcal vaccine   Influenza vaccine  Td vaccine  Prostate cancer screening  Colorectal cancer screening  Diabetes screening  Glaucoma screening  Advanced directives: has an advanced directive - a copy HAS NOT been provided.  Diet review for nutrition referral? Yes ____  Not Indicated _x___   Patient Instructions (the written plan) was given to the patient.  Medicare Attestation I have personally reviewed: The patient's medical and social history Their use of alcohol, tobacco or illicit drugs Their current medications and supplements The patient's functional ability including ADLs,fall risks, home safety risks, cognitive, and hearing and visual impairment Diet and physical activities Evidence for depression or mood disorders  The patient's weight, height, BMI, and visual acuity have been recorded in the chart.  I have  made referrals, counseling, and provided education to the patient based on review of the above and I have provided the patient with a written personalized  care plan for preventive services.     Loreen Freud, DO   08/09/2013

## 2013-08-09 NOTE — Patient Instructions (Signed)
 Preventive Care for Adults, Male A healthy lifestyle and preventive care can promote health and wellness. Preventive health guidelines for men include the following key practices:  A routine yearly physical is a good way to check with your caregiver about your health and preventative screening. It is a chance to share any concerns and updates on your health, and to receive a thorough exam.  Visit your dentist for a routine exam and preventative care every 6 months. Brush your teeth twice a day and floss once a day. Good oral hygiene prevents tooth decay and gum disease.  The frequency of eye exams is based on your age, health, family medical history, use of contact lenses, and other factors. Follow your caregiver's recommendations for frequency of eye exams.  Eat a healthy diet. Foods like vegetables, fruits, whole grains, low-fat dairy products, and lean protein foods contain the nutrients you need without too many calories. Decrease your intake of foods high in solid fats, added sugars, and salt. Eat the right amount of calories for you.Get information about a proper diet from your caregiver, if necessary.  Regular physical exercise is one of the most important things you can do for your health. Most adults should get at least 150 minutes of moderate-intensity exercise (any activity that increases your heart rate and causes you to sweat) each week. In addition, most adults need muscle-strengthening exercises on 2 or more days a week.  Maintain a healthy weight. The body mass index (BMI) is a screening tool to identify possible weight problems. It provides an estimate of body fat based on height and weight. Your caregiver can help determine your BMI, and can help you achieve or maintain a healthy weight.For adults 20 years and older:  A BMI below 18.5 is considered underweight.  A BMI of 18.5 to 24.9 is normal.  A BMI of 25 to 29.9 is considered overweight.  A BMI of 30 and above is  considered obese.  Maintain normal blood lipids and cholesterol levels by exercising and minimizing your intake of saturated fat. Eat a balanced diet with plenty of fruit and vegetables. Blood tests for lipids and cholesterol should begin at age 20 and be repeated every 5 years. If your lipid or cholesterol levels are high, you are over 50, or you are a high risk for heart disease, you may need your cholesterol levels checked more frequently.Ongoing high lipid and cholesterol levels should be treated with medicines if diet and exercise are not effective.  If you smoke, find out from your caregiver how to quit. If you do not use tobacco, do not start.  Lung cancer screening is recommended for adults aged 55 80 years who are at high risk for developing lung cancer because of a history of smoking. Yearly low-dose computed tomography (CT) is recommended for people who have at least a 30-pack-year history of smoking and are a current smoker or have quit within the past 15 years. A pack year of smoking is smoking an average of 1 pack of cigarettes a day for 1 year (for example: 1 pack a day for 30 years or 2 packs a day for 15 years). Yearly screening should continue until the smoker has stopped smoking for at least 15 years. Yearly screening should also be stopped for people who develop a health problem that would prevent them from having lung cancer treatment.  If you choose to drink alcohol, do not exceed 2 drinks per day. One drink is considered to be 12   ounces (355 mL) of beer, 5 ounces (148 mL) of wine, or 1.5 ounces (44 mL) of liquor.  Avoid use of street drugs. Do not share needles with anyone. Ask for help if you need support or instructions about stopping the use of drugs.  High blood pressure causes heart disease and increases the risk of stroke. Your blood pressure should be checked at least every 1 to 2 years. Ongoing high blood pressure should be treated with medicines, if weight loss and  exercise are not effective.  If you are 45 to 77 years old, ask your caregiver if you should take aspirin to prevent heart disease.  Diabetes screening involves taking a blood sample to check your fasting blood sugar level. This should be done once every 3 years, after age 45, if you are within normal weight and without risk factors for diabetes. Testing should be considered at a younger age or be carried out more frequently if you are overweight and have at least 1 risk factor for diabetes.  Colorectal cancer can be detected and often prevented. Most routine colorectal cancer screening begins at the age of 50 and continues through age 75. However, your caregiver may recommend screening at an earlier age if you have risk factors for colon cancer. On a yearly basis, your caregiver may provide home test kits to check for hidden blood in the stool. Use of a small camera at the end of a tube, to directly examine the colon (sigmoidoscopy or colonoscopy), can detect the earliest forms of colorectal cancer. Talk to your caregiver about this at age 50, when routine screening begins. Direct examination of the colon should be repeated every 5 to 10 years through age 75, unless early forms of pre-cancerous polyps or small growths are found.  Hepatitis C blood testing is recommended for all people born from 1945 through 1965 and any individual with known risks for hepatitis C.  Practice safe sex. Use condoms and avoid high-risk sexual practices to reduce the spread of sexually transmitted infections (STIs). STIs include gonorrhea, chlamydia, syphilis, trichomonas, herpes, HPV, and human immunodeficiency virus (HIV). Herpes, HIV, and HPV are viral illnesses that have no cure. They can result in disability, cancer, and death.  A one-time screening for abdominal aortic aneurysm (AAA) and surgical repair of large AAAs by sound wave imaging (ultrasonography) is recommended for ages 65 to 75 years who are current or  former smokers.  Healthy men should no longer receive prostate-specific antigen (PSA) blood tests as part of routine cancer screening. Consult with your caregiver about prostate cancer screening.  Testicular cancer screening is not recommended for adult males who have no symptoms. Screening includes self-exam, caregiver exam, and other screening tests. Consult with your caregiver about any symptoms you have or any concerns you have about testicular cancer.  Use sunscreen. Apply sunscreen liberally and repeatedly throughout the day. You should seek shade when your shadow is shorter than you. Protect yourself by wearing long sleeves, pants, a wide-brimmed hat, and sunglasses year round, whenever you are outdoors.  Once a month, do a whole body skin exam, using a mirror to look at the skin on your back. Notify your caregiver of new moles, moles that have irregular borders, moles that are larger than a pencil eraser, or moles that have changed in shape or color.  Stay current with required immunizations.  Influenza vaccine. All adults should be immunized every year.  Tetanus, diphtheria, and acellular pertussis (Td, Tdap) vaccine. An adult who has not   previously received Tdap or who does not know his vaccine status should receive 1 dose of Tdap. This initial dose should be followed by tetanus and diphtheria toxoids (Td) booster doses every 10 years. Adults with an unknown or incomplete history of completing a 3-dose immunization series with Td-containing vaccines should begin or complete a primary immunization series including a Tdap dose. Adults should receive a Td booster every 10 years.  Varicella vaccine. An adult without evidence of immunity to varicella should receive 2 doses or a second dose if he has previously received 1 dose.  Human papillomavirus (HPV) vaccine. Males aged 13 21 years who have not received the vaccine previously should receive the 3-dose series. Males aged 22 26 years may be  immunized. Immunization is recommended through the age of 26 years for any male who has sex with males and did not get any or all doses earlier. Immunization is recommended for any person with an immunocompromised condition through the age of 26 years if he did not get any or all doses earlier. During the 3-dose series, the second dose should be obtained 4 8 weeks after the first dose. The third dose should be obtained 24 weeks after the first dose and 16 weeks after the second dose.  Zoster vaccine. One dose is recommended for adults aged 60 years or older unless certain conditions are present.  Measles, mumps, and rubella (MMR) vaccine. Adults born before 1957 generally are considered immune to measles and mumps. Adults born in 1957 or later should have 1 or more doses of MMR vaccine unless there is a contraindication to the vaccine or there is laboratory evidence of immunity to each of the three diseases. A routine second dose of MMR vaccine should be obtained at least 28 days after the first dose for students attending postsecondary schools, health care workers, or international travelers. People who received inactivated measles vaccine or an unknown type of measles vaccine during 1963 1967 should receive 2 doses of MMR vaccine. People who received inactivated mumps vaccine or an unknown type of mumps vaccine before 1979 and are at high risk for mumps infection should consider immunization with 2 doses of MMR vaccine. Unvaccinated health care workers born before 1957 who lack laboratory evidence of measles, mumps, or rubella immunity or laboratory confirmation of disease should consider measles and mumps immunization with 2 doses of MMR vaccine or rubella immunization with 1 dose of MMR vaccine.  Pneumococcal 13-valent conjugate (PCV13) vaccine. When indicated, a person who is uncertain of his immunization history and has no record of immunization should receive the PCV13 vaccine. An adult aged 19 years or  older who has certain medical conditions and has not been previously immunized should receive 1 dose of PCV13 vaccine. This PCV13 should be followed with a dose of pneumococcal polysaccharide (PPSV23) vaccine. The PPSV23 vaccine dose should be obtained at least 8 weeks after the dose of PCV13 vaccine. An adult aged 19 years or older who has certain medical conditions and previously received 1 or more doses of PPSV23 vaccine should receive 1 dose of PCV13. The PCV13 vaccine dose should be obtained 1 or more years after the last PPSV23 vaccine dose.  Pneumococcal polysaccharide (PPSV23) vaccine. When PCV13 is also indicated, PCV13 should be obtained first. All adults aged 65 years and older should be immunized. An adult younger than age 65 years who has certain medical conditions should be immunized. Any person who resides in a nursing home or long-term care facility should be   immunized. An adult smoker should be immunized. People with an immunocompromised condition and certain other conditions should receive both PCV13 and PPSV23 vaccines. People with human immunodeficiency virus (HIV) infection should be immunized as soon as possible after diagnosis. Immunization during chemotherapy or radiation therapy should be avoided. Routine use of PPSV23 vaccine is not recommended for American Indians, [place] Natives, or people younger than 65 years unless there are medical conditions that require PPSV23 vaccine. When indicated, people who have unknown immunization and have no record of immunization should receive PPSV23 vaccine. One-time revaccination 5 years after the first dose of PPSV23 is recommended for people aged 19 64 years who have chronic kidney failure, nephrotic syndrome, asplenia, or immunocompromised conditions. People who received 1 2 doses of PPSV23 before age 65 years should receive another dose of PPSV23 vaccine at age 65 years or later if at least 5 years have passed since the previous dose. Doses of  PPSV23 are not needed for people immunized with PPSV23 at or after age 65 years.  Meningococcal vaccine. Adults with asplenia or persistent complement component deficiencies should receive 2 doses of quadrivalent meningococcal conjugate (MenACWY-D) vaccine. The doses should be obtained at least 2 months apart. Microbiologists working with certain meningococcal bacteria, military recruits, people at risk during an outbreak, and people who travel to or live in countries with a high rate of meningitis should be immunized. A first-year college student up through age 21 years who is living in a residence hall should receive a dose if he did not receive a dose on or after his 16th birthday. Adults who have certain high-risk conditions should receive one or more doses of vaccine.  Hepatitis A vaccine. Adults who wish to be protected from this disease, have certain high-risk conditions, work with hepatitis A-infected animals, work in hepatitis A research labs, or travel to or work in countries with a high rate of hepatitis A should be immunized. Adults who were previously unvaccinated and who anticipate close contact with an international adoptee during the first 60 days after arrival in the [place] from a country with a high rate of hepatitis A should be immunized.  Hepatitis B vaccine. Adults who wish to be protected from this disease, have certain high-risk conditions, may be exposed to blood or other infectious body fluids, are household contacts or sex partners of hepatitis B positive people, are clients or workers in certain care facilities, or travel to or work in countries with a high rate of hepatitis B should be immunized.  Haemophilus influenzae type b (Hib) vaccine. A previously unvaccinated person with asplenia or sickle cell disease or having a scheduled splenectomy should receive 1 dose of Hib vaccine. Regardless of previous immunization, a recipient of a hematopoietic stem cell transplant  should receive a 3-dose series 6 12 months after his successful transplant. Hib vaccine is not recommended for adults with HIV infection. Preventive Service / Frequency Ages 19 to 39  Blood pressure check.** / Every 1 to 2 years.  Lipid and cholesterol check.** / Every 5 years beginning at age 20.  Hepatitis C blood test.** / For any individual with known risks for hepatitis C.  Skin self-exam. / Monthly.  Influenza vaccine. / Every year.  Tetanus, diphtheria, and acellular pertussis (Tdap, Td) vaccine.** / Consult your caregiver. 1 dose of Td every 10 years.  Varicella vaccine.** / Consult your caregiver.  HPV vaccine. / 3 doses over 6 months, if 26 and younger.  Measles, mumps, rubella (MMR) vaccine.** /   You need at least 1 dose of MMR if you were born in 1957 or later. You may also need a 2nd dose.  Pneumococcal 13-valent conjugate (PCV13) vaccine.** / Consult your caregiver.  Pneumococcal polysaccharide (PPSV23) vaccine.** / 1 to 2 doses if you smoke cigarettes or if you have certain conditions.  Meningococcal vaccine.** / 1 dose if you are age 19 to 21 years and a first-year college student living in a residence hall, or have one of several medical conditions, you need to get vaccinated against meningococcal disease. You may also need additional booster doses.  Hepatitis A vaccine.** / Consult your caregiver.  Hepatitis B vaccine.** / Consult your caregiver.  Haemophilus influenzae type b (Hib) vaccine.** / Consult your caregiver. Ages 40 to 64  Blood pressure check.** / Every 1 to 2 years.  Lipid and cholesterol check.** / Every 5 years beginning at age 20.  Lung cancer screening. / Every year if you are aged 55 80 years and have a 30-pack-year history of smoking and currently smoke or have quit within the past 15 years. Yearly screening is stopped once you have quit smoking for at least 15 years or develop a health problem that would prevent you from having lung cancer  treatment.  Fecal occult blood test (FOBT) of stool. / Every year beginning at age 50 and continuing until age 75. You may not have to do this test if you get colonoscopy every 10 years.  Flexible sigmoidoscopy** or colonoscopy.** / Every 5 years for a flexible sigmoidoscopy or every 10 years for a colonoscopy beginning at age 50 and continuing until age 75.  Hepatitis C blood test.** / For all people born from 1945 through 1965 and any individual with known risks for hepatitis C.  Skin self-exam. / Monthly.  Influenza vaccine. / Every year.  Tetanus, diphtheria, and acellular pertussis (Tdap/Td) vaccine.** / Consult your caregiver. 1 dose of Td every 10 years.  Varicella vaccine.** / Consult your caregiver.  Zoster vaccine.** / 1 dose for adults aged 60 years or older.  Measles, mumps, rubella (MMR) vaccine.** / You need at least 1 dose of MMR if you were born in 1957 or later. You may also need a 2nd dose.  Pneumococcal 13-valent conjugate (PCV13) vaccine.** / Consult your caregiver.  Pneumococcal polysaccharide (PPSV23) vaccine.** / 1 to 2 doses if you smoke cigarettes or if you have certain conditions.  Meningococcal vaccine.** / Consult your caregiver.  Hepatitis A vaccine.** / Consult your caregiver.  Hepatitis B vaccine.** / Consult your caregiver.  Haemophilus influenzae type b (Hib) vaccine.** / Consult your caregiver. Ages 65 and over  Blood pressure check.** / Every 1 to 2 years.  Lipid and cholesterol check.**/ Every 5 years beginning at age 20.  Lung cancer screening. / Every year if you are aged 55 80 years and have a 30-pack-year history of smoking and currently smoke or have quit within the past 15 years. Yearly screening is stopped once you have quit smoking for at least 15 years or develop a health problem that would prevent you from having lung cancer treatment.  Fecal occult blood test (FOBT) of stool. / Every year beginning at age 50 and continuing until  age 75. You may not have to do this test if you get colonoscopy every 10 years.  Flexible sigmoidoscopy** or colonoscopy.** / Every 5 years for a flexible sigmoidoscopy or every 10 years for a colonoscopy beginning at age 50 and continuing until age 75.  Hepatitis C blood   test.** / For all people born from 1945 through 1965 and any individual with known risks for hepatitis C.  Abdominal aortic aneurysm (AAA) screening.** / A one-time screening for ages 65 to 75 years who are current or former smokers.  Skin self-exam. / Monthly.  Influenza vaccine. / Every year.  Tetanus, diphtheria, and acellular pertussis (Tdap/Td) vaccine.** / 1 dose of Td every 10 years.  Varicella vaccine.** / Consult your caregiver.  Zoster vaccine.** / 1 dose for adults aged 60 years or older.  Pneumococcal 13-valent conjugate (PCV13) vaccine.** / Consult your caregiver.  Pneumococcal polysaccharide (PPSV23) vaccine.** / 1 dose for all adults aged 65 years and older.  Meningococcal vaccine.** / Consult your caregiver.  Hepatitis A vaccine.** / Consult your caregiver.  Hepatitis B vaccine.** / Consult your caregiver.  Haemophilus influenzae type b (Hib) vaccine.** / Consult your caregiver. **Family history and personal history of risk and conditions may change your caregiver's recommendations. Document Released: 11/01/2001 Document Revised: 12/31/2012 Document Reviewed: 01/31/2011 [employer] Patient Information 2014 [employer].  

## 2013-08-11 NOTE — Assessment & Plan Note (Signed)
Per rheum 

## 2013-08-11 NOTE — Assessment & Plan Note (Signed)
Check labs con't meds 

## 2013-08-11 NOTE — Assessment & Plan Note (Signed)
Stable , con't meds  

## 2013-08-11 NOTE — Assessment & Plan Note (Signed)
Per u rology 

## 2013-08-13 LAB — POCT URINALYSIS DIPSTICK
Bilirubin, UA: NEGATIVE
Blood, UA: NEGATIVE
Leukocytes, UA: NEGATIVE
Nitrite, UA: NEGATIVE
Protein, UA: NEGATIVE
Spec Grav, UA: 1.02
Urobilinogen, UA: 0.2
pH, UA: 6.5

## 2013-08-14 ENCOUNTER — Ambulatory Visit (INDEPENDENT_AMBULATORY_CARE_PROVIDER_SITE_OTHER): Payer: Medicare Other

## 2013-08-14 DIAGNOSIS — Z23 Encounter for immunization: Secondary | ICD-10-CM | POA: Diagnosis not present

## 2013-09-04 ENCOUNTER — Other Ambulatory Visit: Payer: Self-pay | Admitting: Family Medicine

## 2013-09-09 DIAGNOSIS — M069 Rheumatoid arthritis, unspecified: Secondary | ICD-10-CM | POA: Diagnosis not present

## 2013-09-09 DIAGNOSIS — M159 Polyosteoarthritis, unspecified: Secondary | ICD-10-CM | POA: Diagnosis not present

## 2013-09-09 DIAGNOSIS — M810 Age-related osteoporosis without current pathological fracture: Secondary | ICD-10-CM | POA: Diagnosis not present

## 2013-09-09 DIAGNOSIS — M353 Polymyalgia rheumatica: Secondary | ICD-10-CM | POA: Diagnosis not present

## 2013-09-09 DIAGNOSIS — M79609 Pain in unspecified limb: Secondary | ICD-10-CM | POA: Diagnosis not present

## 2013-09-30 ENCOUNTER — Encounter: Payer: Self-pay | Admitting: Family Medicine

## 2013-09-30 ENCOUNTER — Ambulatory Visit (INDEPENDENT_AMBULATORY_CARE_PROVIDER_SITE_OTHER): Payer: Medicare Other | Admitting: Family Medicine

## 2013-09-30 VITALS — BP 140/80 | HR 105 | Temp 100.1°F | Resp 16 | Wt 241.2 lb

## 2013-09-30 DIAGNOSIS — R6889 Other general symptoms and signs: Secondary | ICD-10-CM | POA: Diagnosis not present

## 2013-09-30 DIAGNOSIS — R69 Illness, unspecified: Secondary | ICD-10-CM

## 2013-09-30 DIAGNOSIS — J111 Influenza due to unidentified influenza virus with other respiratory manifestations: Secondary | ICD-10-CM | POA: Diagnosis not present

## 2013-09-30 LAB — POCT INFLUENZA A/B
INFLUENZA A, POC: NEGATIVE
Influenza B, POC: NEGATIVE

## 2013-09-30 MED ORDER — BENZONATATE 200 MG PO CAPS: 200.0000 mg | ORAL_CAPSULE | Freq: Three times a day (TID) | ORAL | Status: DC | PRN

## 2013-09-30 MED ORDER — OSELTAMIVIR PHOSPHATE 75 MG PO CAPS: 75.0000 mg | ORAL_CAPSULE | Freq: Two times a day (BID) | ORAL | Status: DC

## 2013-09-30 NOTE — Progress Notes (Signed)
Pre visit review using our clinic review tool, if applicable. No additional management support is needed unless otherwise documented below in the visit note. 

## 2013-09-30 NOTE — Patient Instructions (Signed)
Follow up as needed Despite the negative flu test, we're going to treat this as the flu due to your exposure Start the Tamiflu twice daily Drink plenty of fluids REST! Use Mucinex to thin any congestion Use the Tessalon cough pills as needed Call if no improvement or worsening Hang in there!!

## 2013-09-30 NOTE — Assessment & Plan Note (Signed)
New.  Rapid test (-) but given pt's exposure will tx w/ Tamiflu.  Cough meds prn.  Reviewed supportive care and red flags that should prompt return.  Pt expressed understanding and is in agreement w/ plan.

## 2013-09-30 NOTE — Progress Notes (Signed)
   Subjective:    Patient ID: Andrew Ramos, male    DOB: 1932/01/08, 79 y.o.   MRN: [id number]  HPI Flu- spent 5 hrs in ER yesterday w/ wife, dx'd w/ URI and started on abx.  sxs started acutely last night w/ cough, fevers, chills, body aches.  No sinus pain/pressure.  No ear pain.  Cough is dry.  No N/V/D.     Review of Systems For ROS see HPI     Objective:   Physical Exam  Vitals reviewed. Constitutional: He is oriented to person, place, and time. He appears well-developed and well-nourished. He appears distressed (obviously uncomfortable).  HENT:  Head: Normocephalic and atraumatic.  Right Ear: Tympanic membrane normal.  Left Ear: Tympanic membrane normal.  Nose: No mucosal edema or rhinorrhea. Right sinus exhibits no maxillary sinus tenderness and no frontal sinus tenderness. Left sinus exhibits no maxillary sinus tenderness and no frontal sinus tenderness.  Mouth/Throat: Oropharynx is clear and moist and mucous membranes are normal. No oropharyngeal exudate.  Neck: Normal range of motion. Neck supple.  Pulmonary/Chest: Effort normal and breath sounds normal. No respiratory distress. He has no wheezes. He has no rales.  Lymphadenopathy:    He has cervical adenopathy.  Neurological: He is alert and oriented to person, place, and time.  Skin: Skin is warm.          Assessment & Plan:

## 2013-10-08 ENCOUNTER — Telehealth: Payer: Self-pay | Admitting: Family Medicine

## 2013-10-08 ENCOUNTER — Ambulatory Visit (HOSPITAL_BASED_OUTPATIENT_CLINIC_OR_DEPARTMENT_OTHER)
Admission: RE | Admit: 2013-10-08 | Discharge: 2013-10-08 | Disposition: A | Discharge status: Home / Self Care | Payer: Medicare Other | Source: Ambulatory Visit | Attending: Family Medicine | Admitting: Family Medicine

## 2013-10-08 DIAGNOSIS — R059 Cough, unspecified: Secondary | ICD-10-CM

## 2013-10-08 DIAGNOSIS — R05 Cough: Secondary | ICD-10-CM

## 2013-10-08 NOTE — Telephone Encounter (Signed)
Patient came in on 09/30/13 for his flu-like symptoms and states that he is now left with a hacking cough and sometimes coughing up green phlem. He is calling to see what he needs to do. Please advise.

## 2013-10-08 NOTE — Telephone Encounter (Signed)
[place] for 2 view CXR.  Will determine next steps based on results

## 2013-10-08 NOTE — Telephone Encounter (Signed)
Pt notified and orders placed  

## 2013-10-09 ENCOUNTER — Telehealth: Payer: Self-pay | Admitting: *Deleted

## 2013-10-09 MED ORDER — GUAIFENESIN-CODEINE 100-10 MG/5ML PO SYRP: ORAL_SOLUTION | ORAL | Status: DC

## 2013-10-09 NOTE — Telephone Encounter (Signed)
Med filled and pt notified.  

## 2013-10-09 NOTE — Telephone Encounter (Signed)
Ok for Cheratussin 34ml Q4-6 prn, #240

## 2013-10-09 NOTE — Telephone Encounter (Signed)
Spoke with patient and informed of neg CXR, pt states he still has a "bad cough and wants to know what else can be done." Please advise.

## 2013-10-09 NOTE — Addendum Note (Signed)
Addended by: Kris Hartmann on: 10/09/2013 12:03 PM   Modules accepted: Orders

## 2013-10-20 ENCOUNTER — Other Ambulatory Visit: Payer: Self-pay | Admitting: Family Medicine

## 2013-11-08 ENCOUNTER — Other Ambulatory Visit: Payer: Self-pay | Admitting: Dermatology

## 2013-11-08 DIAGNOSIS — Z85820 Personal history of malignant melanoma of skin: Secondary | ICD-10-CM | POA: Diagnosis not present

## 2013-11-08 DIAGNOSIS — L259 Unspecified contact dermatitis, unspecified cause: Secondary | ICD-10-CM | POA: Diagnosis not present

## 2013-11-08 DIAGNOSIS — L570 Actinic keratosis: Secondary | ICD-10-CM | POA: Diagnosis not present

## 2013-11-08 DIAGNOSIS — D0410 Carcinoma in situ of skin of unspecified eyelid, including canthus: Secondary | ICD-10-CM | POA: Diagnosis not present

## 2013-11-08 DIAGNOSIS — D485 Neoplasm of uncertain behavior of skin: Secondary | ICD-10-CM | POA: Diagnosis not present

## 2013-11-26 DIAGNOSIS — D0439 Carcinoma in situ of skin of other parts of face: Secondary | ICD-10-CM | POA: Diagnosis not present

## 2013-11-26 DIAGNOSIS — D0430 Carcinoma in situ of skin of unspecified part of face: Secondary | ICD-10-CM | POA: Diagnosis not present

## 2013-12-09 DIAGNOSIS — E042 Nontoxic multinodular goiter: Secondary | ICD-10-CM | POA: Diagnosis not present

## 2013-12-09 DIAGNOSIS — M159 Polyosteoarthritis, unspecified: Secondary | ICD-10-CM | POA: Diagnosis not present

## 2013-12-09 DIAGNOSIS — M810 Age-related osteoporosis without current pathological fracture: Secondary | ICD-10-CM | POA: Diagnosis not present

## 2013-12-09 DIAGNOSIS — M79609 Pain in unspecified limb: Secondary | ICD-10-CM | POA: Diagnosis not present

## 2013-12-09 DIAGNOSIS — M069 Rheumatoid arthritis, unspecified: Secondary | ICD-10-CM | POA: Diagnosis not present

## 2013-12-09 DIAGNOSIS — M353 Polymyalgia rheumatica: Secondary | ICD-10-CM | POA: Diagnosis not present

## 2013-12-17 DIAGNOSIS — E042 Nontoxic multinodular goiter: Secondary | ICD-10-CM | POA: Diagnosis not present

## 2013-12-18 ENCOUNTER — Telehealth: Payer: Self-pay | Admitting: Family Medicine

## 2013-12-18 NOTE — Telephone Encounter (Signed)
Patient called to request a refill for terazosin (HYTRIN) 1 MG capsule but he does not have anymore and would like for [place] to send him a couple to [facility] until we renew it with [facility].  Please advise.

## 2013-12-19 MED ORDER — TERAZOSIN HCL 1 MG PO CAPS: ORAL_CAPSULE | ORAL | Status: DC

## 2013-12-19 NOTE — Telephone Encounter (Signed)
30 sent tot he local and pharmacy and 90 days supply sent to [facility].     KP

## 2013-12-27 DIAGNOSIS — H25019 Cortical age-related cataract, unspecified eye: Secondary | ICD-10-CM | POA: Diagnosis not present

## 2013-12-27 DIAGNOSIS — H2510 Age-related nuclear cataract, unspecified eye: Secondary | ICD-10-CM | POA: Diagnosis not present

## 2013-12-27 DIAGNOSIS — H40019 Open angle with borderline findings, low risk, unspecified eye: Secondary | ICD-10-CM | POA: Diagnosis not present

## 2013-12-27 DIAGNOSIS — D2310 Other benign neoplasm of skin of unspecified eyelid, including canthus: Secondary | ICD-10-CM | POA: Diagnosis not present

## 2014-01-01 ENCOUNTER — Other Ambulatory Visit: Payer: Self-pay | Admitting: Family Medicine

## 2014-01-08 DIAGNOSIS — M25559 Pain in unspecified hip: Secondary | ICD-10-CM | POA: Diagnosis not present

## 2014-01-08 DIAGNOSIS — M25569 Pain in unspecified knee: Secondary | ICD-10-CM | POA: Diagnosis not present

## 2014-01-16 DIAGNOSIS — M25569 Pain in unspecified knee: Secondary | ICD-10-CM | POA: Diagnosis not present

## 2014-01-22 DIAGNOSIS — M25559 Pain in unspecified hip: Secondary | ICD-10-CM | POA: Diagnosis not present

## 2014-01-26 ENCOUNTER — Other Ambulatory Visit: Payer: Self-pay | Admitting: Family Medicine

## 2014-01-29 DIAGNOSIS — M25559 Pain in unspecified hip: Secondary | ICD-10-CM | POA: Diagnosis not present

## 2014-02-20 DIAGNOSIS — Z96659 Presence of unspecified artificial knee joint: Secondary | ICD-10-CM | POA: Diagnosis not present

## 2014-02-20 DIAGNOSIS — Z9660 Presence of unspecified orthopedic joint implant: Secondary | ICD-10-CM | POA: Diagnosis not present

## 2014-03-11 DIAGNOSIS — M353 Polymyalgia rheumatica: Secondary | ICD-10-CM | POA: Diagnosis not present

## 2014-03-11 DIAGNOSIS — M069 Rheumatoid arthritis, unspecified: Secondary | ICD-10-CM | POA: Diagnosis not present

## 2014-03-11 DIAGNOSIS — M810 Age-related osteoporosis without current pathological fracture: Secondary | ICD-10-CM | POA: Diagnosis not present

## 2014-03-11 DIAGNOSIS — M159 Polyosteoarthritis, unspecified: Secondary | ICD-10-CM | POA: Diagnosis not present

## 2014-03-14 ENCOUNTER — Other Ambulatory Visit: Payer: Self-pay | Admitting: Family Medicine

## 2014-03-21 DIAGNOSIS — M25579 Pain in unspecified ankle and joints of unspecified foot: Secondary | ICD-10-CM | POA: Diagnosis not present

## 2014-04-09 ENCOUNTER — Other Ambulatory Visit: Payer: Self-pay | Admitting: Family Medicine

## 2014-04-09 DIAGNOSIS — E785 Hyperlipidemia, unspecified: Secondary | ICD-10-CM

## 2014-04-09 NOTE — Telephone Encounter (Signed)
Refill Request:  rosuvastatin (CRESTOR) 20 MG tablet--1 tab by mouth daily--repeat labs are due now  Last Filled: 01/27/14 Amt Filled: 90 tablets, 0 refills Last OV:  09/30/13 Labs: 08/09/13  Medication filled.  90 tablets, 0 refills.  Lab appointment scheduled 04/10/14 @ 10:30 am.  Appointment scheduled by wife.  Wife was advised to encourage patient to fast.  Lab ordered placed (recheck 6 months---lipid, hep, bmp 272.4).

## 2014-04-10 ENCOUNTER — Other Ambulatory Visit (INDEPENDENT_AMBULATORY_CARE_PROVIDER_SITE_OTHER): Payer: Medicare Other

## 2014-04-10 DIAGNOSIS — E785 Hyperlipidemia, unspecified: Secondary | ICD-10-CM

## 2014-04-10 LAB — LIPID PANEL
CHOLESTEROL: 92 mg/dL (ref 0–200)
HDL: 43 mg/dL (ref >39.00)
LDL Cholesterol: 33 mg/dL (ref 0–99)
NonHDL: 49
Total CHOL/HDL Ratio: 2
Triglycerides: 78 mg/dL (ref 0.0–149.0)
VLDL: 15.6 mg/dL (ref 0.0–40.0)

## 2014-04-10 LAB — BASIC METABOLIC PANEL
BUN: 23 mg/dL (ref 6–23)
CO2: 30 mEq/L (ref 19–32)
CREATININE: 1.1 mg/dL (ref 0.4–1.5)
Calcium: 9.8 mg/dL (ref 8.4–10.5)
Chloride: 107 mEq/L (ref 96–112)
GFR: 68.42 mL/min (ref >60.00)
GLUCOSE: 111 mg/dL — AB (ref 70–99)
Potassium: 4.3 mEq/L (ref 3.5–5.1)
Sodium: 142 mEq/L (ref 135–145)

## 2014-04-10 LAB — HEPATIC FUNCTION PANEL
ALT: 34 U/L (ref 0–53)
AST: 28 U/L (ref 0–37)
Albumin: 4 g/dL (ref 3.5–5.2)
Alkaline Phosphatase: 38 U/L — ABNORMAL LOW (ref 39–117)
BILIRUBIN DIRECT: 0.2 mg/dL (ref 0.0–0.3)
BILIRUBIN TOTAL: 1.4 mg/dL — AB (ref 0.2–1.2)
TOTAL PROTEIN: 6.4 g/dL (ref 6.0–8.3)

## 2014-04-18 ENCOUNTER — Other Ambulatory Visit (INDEPENDENT_AMBULATORY_CARE_PROVIDER_SITE_OTHER): Payer: Medicare Other

## 2014-04-18 ENCOUNTER — Telehealth: Payer: Self-pay | Admitting: *Deleted

## 2014-04-18 DIAGNOSIS — R7309 Other abnormal glucose: Secondary | ICD-10-CM | POA: Diagnosis not present

## 2014-04-18 LAB — HEMOGLOBIN A1C
HEMOGLOBIN A1C: 6 % — AB (ref <5.7)
Mean Plasma Glucose: 126 mg/dL — ABNORMAL HIGH (ref <117)

## 2014-04-18 NOTE — Telephone Encounter (Signed)
Informed the pt of recent lab results and note.  Pt verbalized understanding and agreed.  Pt stated that he was fasting.  Pt requested to come in today to have HgbA1c drawn. Pt scheduled an lab appt for today and future lab ordered and sent.//AB/CMA

## 2014-04-18 NOTE — Telephone Encounter (Signed)
Message copied by Harl Bowie on Fri Apr 18, 2014  2:34 PM ------      Message from: Midge Minium      Created: Fri Apr 11, 2014  4:58 PM       Labs look good w/ exception of elevated sugar.  Were you fasting?  If you were fasting you need to return for a follow up lab visit for an A1C (a test for diabetes). ------

## 2014-04-18 NOTE — Addendum Note (Signed)
Addended by: Harl Bowie on: 04/18/2014 03:01 PM   Modules accepted: Orders

## 2014-05-07 ENCOUNTER — Telehealth: Payer: Self-pay

## 2014-05-07 MED ORDER — ONETOUCH DELICA LANCETS 33G MISC: Status: DC

## 2014-05-07 MED ORDER — GLUCOSE BLOOD VI STRP: ORAL_STRIP | Status: DC

## 2014-05-07 NOTE — Telephone Encounter (Signed)
Patient came in with his meter to report blood sugars:  8/5     130 8/6     102 8/7     141 8/9     176 8/10   108 8/11    89 8/12    111 8/13    97 8/14    163

## 2014-05-07 NOTE — Telephone Encounter (Signed)
patient has been made aware and voiced understanding. Rx sent for strips and lancets.     KP

## 2014-05-07 NOTE — Telephone Encounter (Signed)
I need to know time of day--are they all fasting?

## 2014-05-07 NOTE — Telephone Encounter (Signed)
Msg left to call the office     KP 

## 2014-05-07 NOTE — Telephone Encounter (Signed)
Ok -- con't to check con't to watch diet -- starches and simple sugars Recheck labs 3 months

## 2014-05-07 NOTE — Telephone Encounter (Signed)
Spoke with patient and he said the blood sugars are random, the lower ones are fasting.     KP

## 2014-05-08 ENCOUNTER — Telehealth: Payer: Self-pay | Admitting: Family Medicine

## 2014-05-08 MED ORDER — GLUCOSE BLOOD VI STRP: ORAL_STRIP | Status: DC

## 2014-05-08 NOTE — Telephone Encounter (Signed)
Caller name: Owenn  Relation to pt: self  Call back number: [phone] Pharmacy: [facility] [phone]  Reason for call: requesting test strips please send to pharmacy

## 2014-05-08 NOTE — Telephone Encounter (Deleted)
Caller name: Gerrod  Relation to pt: self  Call back number:  Pharmacy:   Reason for call: requesting test strips

## 2014-05-08 NOTE — Telephone Encounter (Signed)
Rx sent      KP 

## 2014-05-09 ENCOUNTER — Telehealth: Payer: Self-pay | Admitting: Family Medicine

## 2014-05-09 MED ORDER — GLUCOSE BLOOD VI STRP: ORAL_STRIP | Status: DC

## 2014-05-09 NOTE — Telephone Encounter (Signed)
Caller name: Jj  Call back number:[phone] Pharmacy[facility]  Reason for call:   Pt states that the pharmacy had questions about the test strips and that the patient had to call us for more information.   Requested to speak with Maudie Mercury

## 2014-05-09 NOTE — Telephone Encounter (Signed)
PA initiated. The patient has [employer] and they will not cover the [facility] test strips. The patient would have to be blind, visually impaired, on an insulin pump, or has a documented mental illness in order to get the One touch. The preferred meter is Freestyle Lite or precision X-tra. I made the patient aware and he voiced understanding, Rx for Freestyle lite test strips has been sent to the pharmacy and a meter has been left at check in.      KP

## 2014-05-11 ENCOUNTER — Other Ambulatory Visit: Payer: Self-pay | Admitting: Family Medicine

## 2014-05-25 ENCOUNTER — Other Ambulatory Visit: Payer: Self-pay | Admitting: Family Medicine

## 2014-06-02 DIAGNOSIS — M25559 Pain in unspecified hip: Secondary | ICD-10-CM | POA: Diagnosis not present

## 2014-06-02 DIAGNOSIS — M76899 Other specified enthesopathies of unspecified lower limb, excluding foot: Secondary | ICD-10-CM | POA: Diagnosis not present

## 2014-06-02 DIAGNOSIS — Z96649 Presence of unspecified artificial hip joint: Secondary | ICD-10-CM | POA: Diagnosis not present

## 2014-06-09 DIAGNOSIS — M810 Age-related osteoporosis without current pathological fracture: Secondary | ICD-10-CM | POA: Diagnosis not present

## 2014-06-09 DIAGNOSIS — M353 Polymyalgia rheumatica: Secondary | ICD-10-CM | POA: Diagnosis not present

## 2014-06-09 DIAGNOSIS — M069 Rheumatoid arthritis, unspecified: Secondary | ICD-10-CM | POA: Diagnosis not present

## 2014-06-09 DIAGNOSIS — M159 Polyosteoarthritis, unspecified: Secondary | ICD-10-CM | POA: Diagnosis not present

## 2014-06-20 ENCOUNTER — Other Ambulatory Visit: Payer: Self-pay | Admitting: Family Medicine

## 2014-06-24 ENCOUNTER — Other Ambulatory Visit: Payer: Self-pay | Admitting: Dermatology

## 2014-06-24 DIAGNOSIS — Z85820 Personal history of malignant melanoma of skin: Secondary | ICD-10-CM | POA: Diagnosis not present

## 2014-06-24 DIAGNOSIS — L570 Actinic keratosis: Secondary | ICD-10-CM | POA: Diagnosis not present

## 2014-06-24 DIAGNOSIS — Z85828 Personal history of other malignant neoplasm of skin: Secondary | ICD-10-CM | POA: Diagnosis not present

## 2014-06-24 DIAGNOSIS — C44712 Basal cell carcinoma of skin of right lower limb, including hip: Secondary | ICD-10-CM | POA: Diagnosis not present

## 2014-06-24 DIAGNOSIS — Z86018 Personal history of other benign neoplasm: Secondary | ICD-10-CM | POA: Diagnosis not present

## 2014-06-24 DIAGNOSIS — D239 Other benign neoplasm of skin, unspecified: Secondary | ICD-10-CM | POA: Diagnosis not present

## 2014-06-24 DIAGNOSIS — D485 Neoplasm of uncertain behavior of skin: Secondary | ICD-10-CM | POA: Diagnosis not present

## 2014-06-26 ENCOUNTER — Ambulatory Visit (INDEPENDENT_AMBULATORY_CARE_PROVIDER_SITE_OTHER): Payer: Medicare Other

## 2014-06-26 DIAGNOSIS — Z23 Encounter for immunization: Secondary | ICD-10-CM

## 2014-06-27 DIAGNOSIS — M216X2 Other acquired deformities of left foot: Secondary | ICD-10-CM | POA: Diagnosis not present

## 2014-06-27 DIAGNOSIS — Q6689 Other  specified congenital deformities of feet: Secondary | ICD-10-CM | POA: Diagnosis not present

## 2014-06-27 DIAGNOSIS — D2371 Other benign neoplasm of skin of right lower limb, including hip: Secondary | ICD-10-CM | POA: Diagnosis not present

## 2014-06-30 ENCOUNTER — Telehealth: Payer: Self-pay | Admitting: *Deleted

## 2014-06-30 DIAGNOSIS — H40009 Preglaucoma, unspecified, unspecified eye: Secondary | ICD-10-CM | POA: Diagnosis not present

## 2014-06-30 DIAGNOSIS — H40013 Open angle with borderline findings, low risk, bilateral: Secondary | ICD-10-CM | POA: Diagnosis not present

## 2014-06-30 NOTE — Telephone Encounter (Signed)
Received referral.  Called pt and confirmed 07/03/14 genetic appt w/ pt.  Called referring and left a message for Santiago Glad to make her aware.  Took a copy of paperwork to genetic's office and took a copy to HIM to scan.

## 2014-07-02 ENCOUNTER — Encounter: Payer: Self-pay | Admitting: Gastroenterology

## 2014-07-03 ENCOUNTER — Ambulatory Visit (HOSPITAL_BASED_OUTPATIENT_CLINIC_OR_DEPARTMENT_OTHER): Payer: Medicare Other | Admitting: Genetic Counselor

## 2014-07-03 ENCOUNTER — Other Ambulatory Visit: Payer: Medicare Other

## 2014-07-03 ENCOUNTER — Encounter: Payer: Self-pay | Admitting: Genetic Counselor

## 2014-07-03 DIAGNOSIS — C439 Malignant melanoma of skin, unspecified: Secondary | ICD-10-CM | POA: Insufficient documentation

## 2014-07-03 DIAGNOSIS — Z315 Encounter for genetic counseling: Secondary | ICD-10-CM | POA: Diagnosis not present

## 2014-07-03 DIAGNOSIS — Z8546 Personal history of malignant neoplasm of prostate: Secondary | ICD-10-CM

## 2014-07-03 DIAGNOSIS — K635 Polyp of colon: Secondary | ICD-10-CM | POA: Diagnosis not present

## 2014-07-03 DIAGNOSIS — C61 Malignant neoplasm of prostate: Secondary | ICD-10-CM | POA: Insufficient documentation

## 2014-07-03 DIAGNOSIS — Z809 Family history of malignant neoplasm, unspecified: Secondary | ICD-10-CM | POA: Diagnosis not present

## 2014-07-03 DIAGNOSIS — C4490 Unspecified malignant neoplasm of skin, unspecified: Secondary | ICD-10-CM | POA: Insufficient documentation

## 2014-07-03 NOTE — Progress Notes (Signed)
Patient Name: Andrew Ramos Patient Age: 80 y.o. Encounter Date: 07/03/2014    Primary Care Provider: Garnet Koyanagi, DO   Mr. Andrew Ramos, a 80 y.o. male, is being seen at the [facility] due to a personal and family history of cancer. He presents to clinic today to discuss the possibility of a hereditary predisposition to cancer and discuss whether genetic testing is warranted.  HISTORY OF PRESENT ILLNESS: Mr. Andrew Ramos has a history of several cancers:  Prostate cancer at age 24 treated with radiation seed implants Melanoma at age 89 - he could not recall the location Basal cell and squamous cell skin cancers  He also reports that he had his first colonoscopy at age 35. At that time, he states 16 polyps were removed. He had a repeat colonoscopy at age 12, where he states another 59 polyps were removed. His most recent colonoscopy (2013) identified one sessile serrated adenoma. Between age 72 and the colonoscopy in 2013, he said he had a screening every 5 years and polyps were removed, but he could not recall how many. We have no documentation today as to the type(s) of polyps removed.    Past Medical History  Diagnosis Date  . Hypertension   . Hyperlipidemia   . Colon polyps     Tubular Adenoma 2005  . Thyroid disease sees dr altzhimer for yearly    nodules  . Prostate cancer 2007    s/p radiation seed implants  . Polymyalgia rheumatica   . Hepatitis     pt told by [facility] has hepatitis antibodies in blood   . Osteoarthritis     right hip- none since hip replacement  . Prostate cancer   . Melanoma   . Nonmelanoma skin cancer   . Polyposis of colon     Past Surgical History  Procedure Laterality Date  . Total knee arthroplasty  12/31/2008    left  . Total hip arthroplasty  08/27/2008    left  . Tonsillectomy  as child  . Total hip arthroplasty  06/12/2012    Procedure: TOTAL HIP ARTHROPLASTY ANTERIOR APPROACH;  Surgeon: Mauri Pole, MD;   Location: [facility];  Service: Orthopedics;  Laterality: Right;  . Colonoscopy    . Polypectomy      History   Social History  . Marital Status: Married    Spouse Name: N/A    Number of Children: N/A  . Years of Education: N/A   Occupational History  . retired Nature conservation officer    Social History Main Topics  . Smoking status: Never Smoker   . Smokeless tobacco: Never Used  . Alcohol Use: Yes     Comment: very occasional  . Drug Use: No  . Sexual Activity: Yes    Partners: Female   Other Topics Concern  . Not on file   Social History Narrative  . No narrative on file     FAMILY HISTORY:   During the visit, a 4-generation pedigree was obtained. Family tree will be sent for scanning and will be in EPIC under the Media tab.  Significant diagnoses include the following:  Family History  Problem Relation Age of Onset  . Coronary artery disease Mother 67  . Heart disease Mother 67    MI  . Coronary artery disease Father 43  . Pancreatic cancer Father 75    deceased 38  . Pancreatic cancer Brother 29    deceased 43; smoker    Additionally, Mr. Andrew Ramos has  a daughter (age 3) and two other brothers (age 64 and 61) who are all cancer-free. His mother died at 62 due to an MI. She had 3 sister who died in their 67s and 13s, cancer-free.  His father had 3 sisters and 2 brothers who all died, cancer-free.  Mr. Andrew Ramos ancestry is Caucasian - NOS. There is no known Jewish ancestry and no consanguinity.  ASSESSMENT AND PLAN: Mr. Andrew Ramos is a 80 y.o. male with a personal history of multiple colon polyps as well as prostate cancer, melanoma and non-melanoma skin cancers. Even with having that many polyps, his family history is not highly suggestive of a hereditary predisposition to cancer since there are no other cancers other than pancreatic cancer. It may be that his polyposis is due to homozygous MYH mutations rather than a dominantly-acting gene. We reviewed the characteristics,  features and inheritance patterns of hereditary cancer syndromes. We also discussed genetic testing, including the process of testing, insurance coverage and implications of results. He meets Medicare's criteria for testing given his polyp burden. A negative result, however, will be reassuring for him and his relatives.  Mr. Andrew Ramos wished to pursue genetic testing and a blood sample will be sent to [facility] for analysis of the 32 genes on the CancerNext gene panel. We discussed the implications of a positive, negative and/ or Variant of Uncertain Significance (VUS) result. Results should be available in approximately 4-5 weeks, at which point we will contact him and address implications for him as well as address genetic testing for at-risk family members, if needed.    We encouraged Mr. Andrew Ramos to remain in contact with Cancer Genetics annually so that we can update the family history and inform him of any changes in cancer genetics and testing that may be of benefit for this family. Mr.  Andrew Ramos questions were answered to his satisfaction today.   Thank you for the referral and allowing [place] to share in the care of your patient.   The patient was seen for a total of 30 minutes, greater than 50% of which was spent face-to-face counseling. This patient was discussed with the overseeing provider who agrees with the above.   Steele Berg, MS, [place] Certified Genetic Counseor phone: [phone] [email or web address]

## 2014-07-15 DIAGNOSIS — C61 Malignant neoplasm of prostate: Secondary | ICD-10-CM | POA: Diagnosis not present

## 2014-07-21 DIAGNOSIS — D2371 Other benign neoplasm of skin of right lower limb, including hip: Secondary | ICD-10-CM | POA: Diagnosis not present

## 2014-07-24 DIAGNOSIS — C44712 Basal cell carcinoma of skin of right lower limb, including hip: Secondary | ICD-10-CM | POA: Diagnosis not present

## 2014-07-25 ENCOUNTER — Telehealth: Payer: Self-pay | Admitting: Family Medicine

## 2014-07-25 MED ORDER — TERAZOSIN HCL 1 MG PO CAPS: 1.0000 mg | ORAL_CAPSULE | Freq: Every day | ORAL | Status: DC

## 2014-07-25 NOTE — Telephone Encounter (Signed)
Caller name: [facility]  Call back number:[phone]   Reason for call:    requesting a refill of terazosin (HYTRIN) 1 MG capsule  Reference # [id number]

## 2014-07-25 NOTE — Telephone Encounter (Signed)
Pt scheduled he's cpe 10/06/2014

## 2014-07-25 NOTE — Telephone Encounter (Signed)
It has been 1 year since we seen this patient, he needs an appointment.      KP

## 2014-07-30 ENCOUNTER — Encounter: Payer: Self-pay | Admitting: Genetic Counselor

## 2014-07-30 NOTE — Progress Notes (Signed)
GENETIC TEST RESULTS  Patient Name: Andrew Ramos Patient Age: 80 y.o. Encounter Date: 07/30/2014  Primary Care Provider: Garnet Koyanagi, DO   [name] was called today to discuss genetic test results. Please see the Genetics note from his visit on 07/03/2014 for a detailed discussion of his personal and family history.  GENETIC TESTING: At the time of [name]'s visit, we recommended he pursue genetic testing of multiple genes on the CancerNext gene panel. This test, which included sequencing and deletion/duplication analysis of 32 genes, was performed at [facility]. Testing was normal and did not reveal a mutation in these genes. The genes tested were APC, ATM, BARD1, BRCA1, BRCA2, BRIP1, BMPR1A, CDH1, CDK4, CDKN2A, CHEK2, EPCAM, GREM1, MLH1, MRE11A, MSH2, MSH6, MUTYH, NBN, NF1, PALB2, PMS2, POLD1, POLE, PTEN, RAD50, RAD51C, RAD51D, SMAD4, SMARCA4, STK11, and TP53.  We discussed with [name] that since the current test is not perfect, it is possible there may be a gene mutation that current testing cannot detect, but that chance is small. We also discussed that it is possible that a different genetic factor, which was not part of this testing or has not yet been discovered, is responsible for his polyps and/or the cancer diagnoses in the family. Should [name] wish to discuss or pursue this additional testing, we are happy to coordinate this at any time, but do not feel that she is at significant risk of harboring a mutation in a different gene.     CANCER SCREENING: This result is generally reassuring, but does not explain [name]'s history of colon polyps. We recommended he continue to follow the cancer screening guidelines provided by his physicians.   FAMILY MEMBERS: Family members are at some increased risk of developing cancer, over the general population risk, simply due to the family history. We recommended women have a yearly mammogram beginning at age 39,  a yearly clinical breast exam, and perform monthly breast self-exams. A gynecologic exam is recommended yearly. Given the history of colon polyps, men and women in the family should discuss with their own providers whether to initiate colon cancer screening earlier than age 4.  Lastly, we discussed with [name] that cancer genetics is a rapidly advancing field and it is possible that new genetic tests will be appropriate for him in the future. We encouraged him to remain in contact with [place] on an annual basis so we can update his personal and family histories, and let him know of advances in cancer genetics that may benefit the family. Our contact number was provided. [name] questions were answered to his satisfaction today, and he knows he is welcome to call anytime with additional questions.    Steele Berg, MS, [place] Certified Genetic Counselor phone: [phone] [email or web address]

## 2014-08-01 DIAGNOSIS — N401 Enlarged prostate with lower urinary tract symptoms: Secondary | ICD-10-CM | POA: Diagnosis not present

## 2014-08-01 DIAGNOSIS — R3912 Poor urinary stream: Secondary | ICD-10-CM | POA: Diagnosis not present

## 2014-08-01 DIAGNOSIS — C61 Malignant neoplasm of prostate: Secondary | ICD-10-CM | POA: Diagnosis not present

## 2014-08-07 ENCOUNTER — Other Ambulatory Visit: Payer: Self-pay | Admitting: Family Medicine

## 2014-08-31 ENCOUNTER — Other Ambulatory Visit: Payer: Self-pay | Admitting: Family Medicine

## 2014-09-08 ENCOUNTER — Encounter: Payer: Self-pay | Admitting: Internal Medicine

## 2014-09-08 DIAGNOSIS — M0609 Rheumatoid arthritis without rheumatoid factor, multiple sites: Secondary | ICD-10-CM | POA: Diagnosis not present

## 2014-09-08 DIAGNOSIS — M353 Polymyalgia rheumatica: Secondary | ICD-10-CM | POA: Diagnosis not present

## 2014-09-08 DIAGNOSIS — M810 Age-related osteoporosis without current pathological fracture: Secondary | ICD-10-CM | POA: Diagnosis not present

## 2014-09-08 DIAGNOSIS — M150 Primary generalized (osteo)arthritis: Secondary | ICD-10-CM | POA: Diagnosis not present

## 2014-10-04 ENCOUNTER — Other Ambulatory Visit: Payer: Self-pay | Admitting: Family Medicine

## 2014-10-06 ENCOUNTER — Ambulatory Visit (INDEPENDENT_AMBULATORY_CARE_PROVIDER_SITE_OTHER): Payer: Medicare Other | Admitting: Family Medicine

## 2014-10-06 ENCOUNTER — Encounter: Payer: Self-pay | Admitting: Family Medicine

## 2014-10-06 VITALS — BP 138/80 | HR 98 | Temp 98.2°F | Ht 72.0 in | Wt 224.0 lb

## 2014-10-06 DIAGNOSIS — Z8546 Personal history of malignant neoplasm of prostate: Secondary | ICD-10-CM

## 2014-10-06 DIAGNOSIS — E041 Nontoxic single thyroid nodule: Secondary | ICD-10-CM | POA: Diagnosis not present

## 2014-10-06 DIAGNOSIS — Z23 Encounter for immunization: Secondary | ICD-10-CM

## 2014-10-06 DIAGNOSIS — Z0000 Encounter for general adult medical examination without abnormal findings: Secondary | ICD-10-CM | POA: Diagnosis not present

## 2014-10-06 DIAGNOSIS — I10 Essential (primary) hypertension: Secondary | ICD-10-CM | POA: Diagnosis not present

## 2014-10-06 DIAGNOSIS — E119 Type 2 diabetes mellitus without complications: Secondary | ICD-10-CM

## 2014-10-06 DIAGNOSIS — C61 Malignant neoplasm of prostate: Secondary | ICD-10-CM

## 2014-10-06 DIAGNOSIS — M353 Polymyalgia rheumatica: Secondary | ICD-10-CM | POA: Diagnosis not present

## 2014-10-06 DIAGNOSIS — E785 Hyperlipidemia, unspecified: Secondary | ICD-10-CM | POA: Diagnosis not present

## 2014-10-06 LAB — HEPATIC FUNCTION PANEL
ALT: 27 U/L (ref 0–53)
AST: 27 U/L (ref 0–37)
Albumin: 4.1 g/dL (ref 3.5–5.2)
Alkaline Phosphatase: 51 U/L (ref 39–117)
Bilirubin, Direct: 0.2 mg/dL (ref 0.0–0.3)
TOTAL PROTEIN: 6.3 g/dL (ref 6.0–8.3)
Total Bilirubin: 0.8 mg/dL (ref 0.2–1.2)

## 2014-10-06 LAB — CBC WITH DIFFERENTIAL/PLATELET
BASOS PCT: 0.4 % (ref 0.0–3.0)
Basophils Absolute: 0 10*3/uL (ref 0.0–0.1)
EOS PCT: 1.6 % (ref 0.0–5.0)
Eosinophils Absolute: 0.1 10*3/uL (ref 0.0–0.7)
HEMATOCRIT: 42.8 % (ref 39.0–52.0)
HEMOGLOBIN: 13.9 g/dL (ref 13.0–17.0)
LYMPHS PCT: 20.2 % (ref 12.0–46.0)
Lymphs Abs: 0.8 10*3/uL (ref 0.7–4.0)
MCHC: 32.6 g/dL (ref 30.0–36.0)
MCV: 96.1 fl (ref 78.0–100.0)
MONO ABS: 0.3 10*3/uL (ref 0.1–1.0)
Monocytes Relative: 8.1 % (ref 3.0–12.0)
NEUTROS PCT: 69.7 % (ref 43.0–77.0)
Neutro Abs: 2.9 10*3/uL (ref 1.4–7.7)
PLATELETS: 172 10*3/uL (ref 150.0–400.0)
RBC: 4.46 Mil/uL (ref 4.22–5.81)
RDW: 14.7 % (ref 11.5–15.5)
WBC: 4.1 10*3/uL (ref 4.0–10.5)

## 2014-10-06 LAB — LIPID PANEL
Cholesterol: 105 mg/dL (ref 0–200)
HDL: 44.3 mg/dL (ref >39.00)
LDL CALC: 47 mg/dL (ref 0–99)
NonHDL: 60.7
TRIGLYCERIDES: 68 mg/dL (ref 0.0–149.0)
Total CHOL/HDL Ratio: 2
VLDL: 13.6 mg/dL (ref 0.0–40.0)

## 2014-10-06 LAB — POCT URINALYSIS DIPSTICK
Bilirubin, UA: NEGATIVE
Glucose, UA: NEGATIVE
KETONES UA: NEGATIVE
Leukocytes, UA: NEGATIVE
Nitrite, UA: NEGATIVE
PH UA: 6
Protein, UA: NEGATIVE
RBC UA: NEGATIVE
UROBILINOGEN UA: NEGATIVE

## 2014-10-06 LAB — BASIC METABOLIC PANEL
BUN: 19 mg/dL (ref 6–23)
CO2: 28 meq/L (ref 19–32)
Calcium: 10.1 mg/dL (ref 8.4–10.5)
Chloride: 102 mEq/L (ref 96–112)
Creatinine, Ser: 1.07 mg/dL (ref 0.40–1.50)
GFR: 70.55 mL/min (ref >60.00)
GLUCOSE: 110 mg/dL — AB (ref 70–99)
POTASSIUM: 3.9 meq/L (ref 3.5–5.1)
Sodium: 138 mEq/L (ref 135–145)

## 2014-10-06 LAB — MICROALBUMIN / CREATININE URINE RATIO
CREATININE, U: 96.5 mg/dL
MICROALB UR: 0.4 mg/dL (ref 0.0–1.9)
MICROALB/CREAT RATIO: 0.4 mg/g (ref 0.0–30.0)

## 2014-10-06 LAB — HEMOGLOBIN A1C: Hgb A1c MFr Bld: 6 % (ref 4.6–6.5)

## 2014-10-06 LAB — PSA: PSA: 0.08 ng/mL — ABNORMAL LOW (ref 0.10–4.00)

## 2014-10-06 MED ORDER — ASPIRIN 81 MG PO TABS: 81.0000 mg | ORAL_TABLET | Freq: Every day | ORAL | Status: DC

## 2014-10-06 NOTE — Assessment & Plan Note (Signed)
Per u rology 

## 2014-10-06 NOTE — Assessment & Plan Note (Signed)
Per rheumatology 

## 2014-10-06 NOTE — Patient Instructions (Signed)
Preventive Care for Adults A healthy lifestyle and preventive care can promote health and wellness. Preventive health guidelines for men include the following key practices:  A routine yearly physical is a good way to check with your health care provider about your health and preventative screening. It is a chance to share any concerns and updates on your health and to receive a thorough exam.  Visit your dentist for a routine exam and preventative care every 6 months. Brush your teeth twice a day and floss once a day. Good oral hygiene prevents tooth decay and gum disease.  The frequency of eye exams is based on your age, health, family medical history, use of contact lenses, and other factors. Follow your health care provider's recommendations for frequency of eye exams.  Eat a healthy diet. Foods such as vegetables, fruits, whole grains, low-fat dairy products, and lean protein foods contain the nutrients you need without too many calories. Decrease your intake of foods high in solid fats, added sugars, and salt. Eat the right amount of calories for you.Get information about a proper diet from your health care provider, if necessary.  Regular physical exercise is one of the most important things you can do for your health. Most adults should get at least 150 minutes of moderate-intensity exercise (any activity that increases your heart rate and causes you to sweat) each week. In addition, most adults need muscle-strengthening exercises on 2 or more days a week.  Maintain a healthy weight. The body mass index (BMI) is a screening tool to identify possible weight problems. It provides an estimate of body fat based on height and weight. Your health care provider can find your BMI and can help you achieve or maintain a healthy weight.For adults 20 years and older:  A BMI below 18.5 is considered underweight.  A BMI of 18.5 to 24.9 is normal.  A BMI of 25 to 29.9 is considered overweight.  A BMI  of 30 and above is considered obese.  Maintain normal blood lipids and cholesterol levels by exercising and minimizing your intake of saturated fat. Eat a balanced diet with plenty of fruit and vegetables. Blood tests for lipids and cholesterol should begin at age 22 and be repeated every 5 years. If your lipid or cholesterol levels are high, you are over 50, or you are at high risk for heart disease, you may need your cholesterol levels checked more frequently.Ongoing high lipid and cholesterol levels should be treated with medicines if diet and exercise are not working.  If you smoke, find out from your health care provider how to quit. If you do not use tobacco, do not start.  Lung cancer screening is recommended for adults aged 81-80 years who are at high risk for developing lung cancer because of a history of smoking. A yearly low-dose CT scan of the lungs is recommended for people who have at least a 30-pack-year history of smoking and are a current smoker or have quit within the past 15 years. A pack year of smoking is smoking an average of 1 pack of cigarettes a day for 1 year (for example: 1 pack a day for 30 years or 2 packs a day for 15 years). Yearly screening should continue until the smoker has stopped smoking for at least 15 years. Yearly screening should be stopped for people who develop a health problem that would prevent them from having lung cancer treatment.  If you choose to drink alcohol, do not have more than  2 drinks per day. One drink is considered to be 12 ounces (355 mL) of beer, 5 ounces (148 mL) of wine, or 1.5 ounces (44 mL) of liquor.  Avoid use of street drugs. Do not share needles with anyone. Ask for help if you need support or instructions about stopping the use of drugs.  High blood pressure causes heart disease and increases the risk of stroke. Your blood pressure should be checked at least every 1-2 years. Ongoing high blood pressure should be treated with  medicines, if weight loss and exercise are not effective.  If you are 75-55 years old, ask your health care provider if you should take aspirin to prevent heart disease.  Diabetes screening involves taking a blood sample to check your fasting blood sugar level. This should be done once every 3 years, after age 82, if you are within normal weight and without risk factors for diabetes. Testing should be considered at a younger age or be carried out more frequently if you are overweight and have at least 1 risk factor for diabetes.  Colorectal cancer can be detected and often prevented. Most routine colorectal cancer screening begins at the age of 30 and continues through age 76. However, your health care provider may recommend screening at an earlier age if you have risk factors for colon cancer. On a yearly basis, your health care provider may provide home test kits to check for hidden blood in the stool. Use of a small camera at the end of a tube to directly examine the colon (sigmoidoscopy or colonoscopy) can detect the earliest forms of colorectal cancer. Talk to your health care provider about this at age 44, when routine screening begins. Direct exam of the colon should be repeated every 5-10 years through age 13, unless early forms of precancerous polyps or small growths are found.  People who are at an increased risk for hepatitis B should be screened for this virus. You are considered at high risk for hepatitis B if:  You were born in a country where hepatitis B occurs often. Talk with your health care provider about which countries are considered high risk.  Your parents were born in a high-risk country and you have not received a shot to protect against hepatitis B (hepatitis B vaccine).  You have HIV or AIDS.  You use needles to inject street drugs.  You live with, or have sex with, someone who has hepatitis B.  You are a man who has sex with other men (MSM).  You get hemodialysis  treatment.  You take certain medicines for conditions such as cancer, organ transplantation, and autoimmune conditions.  Hepatitis C blood testing is recommended for all people born from [birth year] through 1965 and any individual with known risks for hepatitis C.  Practice safe sex. Use condoms and avoid high-risk sexual practices to reduce the spread of sexually transmitted infections (STIs). STIs include gonorrhea, chlamydia, syphilis, trichomonas, herpes, HPV, and human immunodeficiency virus (HIV). Herpes, HIV, and HPV are viral illnesses that have no cure. They can result in disability, cancer, and death.  If you are at risk of being infected with HIV, it is recommended that you take a prescription medicine daily to prevent HIV infection. This is called preexposure prophylaxis (PrEP). You are considered at risk if:  You are a man who has sex with other men (MSM) and have other risk factors.  You are a heterosexual man, are sexually active, and are at increased risk for HIV infection.  You take drugs by injection.  You are sexually active with a partner who has HIV.  Talk with your health care provider about whether you are at high risk of being infected with HIV. If you choose to begin PrEP, you should first be tested for HIV. You should then be tested every 3 months for as long as you are taking PrEP.  A one-time screening for abdominal aortic aneurysm (AAA) and surgical repair of large AAAs by ultrasound are recommended for men ages 26 to 26 years who are current or former smokers.  Healthy men should no longer receive prostate-specific antigen (PSA) blood tests as part of routine cancer screening. Talk with your health care provider about prostate cancer screening.  Testicular cancer screening is not recommended for adult males who have no symptoms. Screening includes self-exam, a health care provider exam, and other screening tests. Consult with your health care provider about any symptoms  you have or any concerns you have about testicular cancer.  Use sunscreen. Apply sunscreen liberally and repeatedly throughout the day. You should seek shade when your shadow is shorter than you. Protect yourself by wearing long sleeves, pants, a wide-brimmed hat, and sunglasses year round, whenever you are outdoors.  Once a month, do a whole-body skin exam, using a mirror to look at the skin on your back. Tell your health care provider about new moles, moles that have irregular borders, moles that are larger than a pencil eraser, or moles that have changed in shape or color.  Stay current with required vaccines (immunizations).  Influenza vaccine. All adults should be immunized every year.  Tetanus, diphtheria, and acellular pertussis (Td, Tdap) vaccine. An adult who has not previously received Tdap or who does not know his vaccine status should receive 1 dose of Tdap. This initial dose should be followed by tetanus and diphtheria toxoids (Td) booster doses every 10 years. Adults with an unknown or incomplete history of completing a 3-dose immunization series with Td-containing vaccines should begin or complete a primary immunization series including a Tdap dose. Adults should receive a Td booster every 10 years.  Varicella vaccine. An adult without evidence of immunity to varicella should receive 2 doses or a second dose if he has previously received 1 dose.  Human papillomavirus (HPV) vaccine. Males aged 70-21 years who have not received the vaccine previously should receive the 3-dose series. Males aged 22-26 years may be immunized. Immunization is recommended through the age of 62 years for any male who has sex with males and did not get any or all doses earlier. Immunization is recommended for any person with an immunocompromised condition through the age of 42 years if he did not get any or all doses earlier. During the 3-dose series, the second dose should be obtained 4-8 weeks after the first  dose. The third dose should be obtained 24 weeks after the first dose and 16 weeks after the second dose.  Zoster vaccine. One dose is recommended for adults aged 32 years or older unless certain conditions are present.  Measles, mumps, and rubella (MMR) vaccine. Adults born before [birth year] generally are considered immune to measles and mumps. Adults born in [birth year] or later should have 1 or more doses of MMR vaccine unless there is a contraindication to the vaccine or there is laboratory evidence of immunity to each of the three diseases. A routine second dose of MMR vaccine should be obtained at least 28 days after the first dose for students attending postsecondary  schools, health care workers, or international travelers. People who received inactivated measles vaccine or an unknown type of measles vaccine during 1963-1967 should receive 2 doses of MMR vaccine. People who received inactivated mumps vaccine or an unknown type of mumps vaccine before 1979 and are at high risk for mumps infection should consider immunization with 2 doses of MMR vaccine. Unvaccinated health care workers born before 1957 who lack laboratory evidence of measles, mumps, or rubella immunity or laboratory confirmation of disease should consider measles and mumps immunization with 2 doses of MMR vaccine or rubella immunization with 1 dose of MMR vaccine.  Pneumococcal 13-valent conjugate (PCV13) vaccine. When indicated, a person who is uncertain of his immunization history and has no record of immunization should receive the PCV13 vaccine. An adult aged 19 years or older who has certain medical conditions and has not been previously immunized should receive 1 dose of PCV13 vaccine. This PCV13 should be followed with a dose of pneumococcal polysaccharide (PPSV23) vaccine. The PPSV23 vaccine dose should be obtained at least 8 weeks after the dose of PCV13 vaccine. An adult aged 19 years or older who has certain medical conditions and  previously received 1 or more doses of PPSV23 vaccine should receive 1 dose of PCV13. The PCV13 vaccine dose should be obtained 1 or more years after the last PPSV23 vaccine dose.  Pneumococcal polysaccharide (PPSV23) vaccine. When PCV13 is also indicated, PCV13 should be obtained first. All adults aged 65 years and older should be immunized. An adult younger than age 65 years who has certain medical conditions should be immunized. Any person who resides in a nursing home or long-term care facility should be immunized. An adult smoker should be immunized. People with an immunocompromised condition and certain other conditions should receive both PCV13 and PPSV23 vaccines. People with human immunodeficiency virus (HIV) infection should be immunized as soon as possible after diagnosis. Immunization during chemotherapy or radiation therapy should be avoided. Routine use of PPSV23 vaccine is not recommended for American Indians, [place] Natives, or people younger than 65 years unless there are medical conditions that require PPSV23 vaccine. When indicated, people who have unknown immunization and have no record of immunization should receive PPSV23 vaccine. One-time revaccination 5 years after the first dose of PPSV23 is recommended for people aged 19-64 years who have chronic kidney failure, nephrotic syndrome, asplenia, or immunocompromised conditions. People who received 1-2 doses of PPSV23 before age 65 years should receive another dose of PPSV23 vaccine at age 65 years or later if at least 5 years have passed since the previous dose. Doses of PPSV23 are not needed for people immunized with PPSV23 at or after age 65 years.  Meningococcal vaccine. Adults with asplenia or persistent complement component deficiencies should receive 2 doses of quadrivalent meningococcal conjugate (MenACWY-D) vaccine. The doses should be obtained at least 2 months apart. Microbiologists working with certain meningococcal bacteria,  military recruits, people at risk during an outbreak, and people who travel to or live in countries with a high rate of meningitis should be immunized. A first-year college student up through age 21 years who is living in a residence hall should receive a dose if he did not receive a dose on or after his 16th birthday. Adults who have certain high-risk conditions should receive one or more doses of vaccine.  Hepatitis A vaccine. Adults who wish to be protected from this disease, have certain high-risk conditions, work with hepatitis A-infected animals, work in hepatitis A research labs, or   travel to or work in countries with a high rate of hepatitis A should be immunized. Adults who were previously unvaccinated and who anticipate close contact with an international adoptee during the first 60 days after arrival in the [place] from a country with a high rate of hepatitis A should be immunized.  Hepatitis B vaccine. Adults should be immunized if they wish to be protected from this disease, have certain high-risk conditions, may be exposed to blood or other infectious body fluids, are household contacts or sex partners of hepatitis B positive people, are clients or workers in certain care facilities, or travel to or work in countries with a high rate of hepatitis B.  Haemophilus influenzae type b (Hib) vaccine. A previously unvaccinated person with asplenia or sickle cell disease or having a scheduled splenectomy should receive 1 dose of Hib vaccine. Regardless of previous immunization, a recipient of a hematopoietic stem cell transplant should receive a 3-dose series 6-12 months after his successful transplant. Hib vaccine is not recommended for adults with HIV infection. Preventive Service / Frequency Ages 52 to 17  Blood pressure check.** / Every 1 to 2 years.  Lipid and cholesterol check.** / Every 5 years beginning at age 69.  Hepatitis C blood test.** / For any individual with known risks for  hepatitis C.  Skin self-exam. / Monthly.  Influenza vaccine. / Every year.  Tetanus, diphtheria, and acellular pertussis (Tdap, Td) vaccine.** / Consult your health care provider. 1 dose of Td every 10 years.  Varicella vaccine.** / Consult your health care provider.  HPV vaccine. / 3 doses over 6 months, if 72 or younger.  Measles, mumps, rubella (MMR) vaccine.** / You need at least 1 dose of MMR if you were born in 1957 or later. You may also need a second dose.  Pneumococcal 13-valent conjugate (PCV13) vaccine.** / Consult your health care provider.  Pneumococcal polysaccharide (PPSV23) vaccine.** / 1 to 2 doses if you smoke cigarettes or if you have certain conditions.  Meningococcal vaccine.** / 1 dose if you are age 35 to 60 years and a Market researcher living in a residence hall, or have one of several medical conditions. You may also need additional booster doses.  Hepatitis A vaccine.** / Consult your health care provider.  Hepatitis B vaccine.** / Consult your health care provider.  Haemophilus influenzae type b (Hib) vaccine.** / Consult your health care provider. Ages 35 to 8  Blood pressure check.** / Every 1 to 2 years.  Lipid and cholesterol check.** / Every 5 years beginning at age 57.  Lung cancer screening. / Every year if you are aged 44-80 years and have a 30-pack-year history of smoking and currently smoke or have quit within the past 15 years. Yearly screening is stopped once you have quit smoking for at least 15 years or develop a health problem that would prevent you from having lung cancer treatment.  Fecal occult blood test (FOBT) of stool. / Every year beginning at age 55 and continuing until age 73. You may not have to do this test if you get a colonoscopy every 10 years.  Flexible sigmoidoscopy** or colonoscopy.** / Every 5 years for a flexible sigmoidoscopy or every 10 years for a colonoscopy beginning at age 28 and continuing until age  1.  Hepatitis C blood test.** / For all people born from [birth year] through 1965 and any individual with known risks for hepatitis C.  Skin self-exam. / Monthly.  Influenza vaccine. / Every  year.  Tetanus, diphtheria, and acellular pertussis (Tdap/Td) vaccine.** / Consult your health care provider. 1 dose of Td every 10 years.  Varicella vaccine.** / Consult your health care provider.  Zoster vaccine.** / 1 dose for adults aged 51 years or older.  Measles, mumps, rubella (MMR) vaccine.** / You need at least 1 dose of MMR if you were born in 1957 or later. You may also need a second dose.  Pneumococcal 13-valent conjugate (PCV13) vaccine.** / Consult your health care provider.  Pneumococcal polysaccharide (PPSV23) vaccine.** / 1 to 2 doses if you smoke cigarettes or if you have certain conditions.  Meningococcal vaccine.** / Consult your health care provider.  Hepatitis A vaccine.** / Consult your health care provider.  Hepatitis B vaccine.** / Consult your health care provider.  Haemophilus influenzae type b (Hib) vaccine.** / Consult your health care provider. Ages 27 and over  Blood pressure check.** / Every 1 to 2 years.  Lipid and cholesterol check.**/ Every 5 years beginning at age 60.  Lung cancer screening. / Every year if you are aged 63-80 years and have a 30-pack-year history of smoking and currently smoke or have quit within the past 15 years. Yearly screening is stopped once you have quit smoking for at least 15 years or develop a health problem that would prevent you from having lung cancer treatment.  Fecal occult blood test (FOBT) of stool. / Every year beginning at age 3 and continuing until age 71. You may not have to do this test if you get a colonoscopy every 10 years.  Flexible sigmoidoscopy** or colonoscopy.** / Every 5 years for a flexible sigmoidoscopy or every 10 years for a colonoscopy beginning at age 28 and continuing until age 76.  Hepatitis C blood  test.** / For all people born from [birth year] through 1963 and any individual with known risks for hepatitis C.  Abdominal aortic aneurysm (AAA) screening.** / A one-time screening for ages 27 to 14 years who are current or former smokers.  Skin self-exam. / Monthly.  Influenza vaccine. / Every year.  Tetanus, diphtheria, and acellular pertussis (Tdap/Td) vaccine.** / 1 dose of Td every 10 years.  Varicella vaccine.** / Consult your health care provider.  Zoster vaccine.** / 1 dose for adults aged 80 years or older.  Pneumococcal 13-valent conjugate (PCV13) vaccine.** / Consult your health care provider.  Pneumococcal polysaccharide (PPSV23) vaccine.** / 1 dose for all adults aged 56 years and older.  Meningococcal vaccine.** / Consult your health care provider.  Hepatitis A vaccine.** / Consult your health care provider.  Hepatitis B vaccine.** / Consult your health care provider.  Haemophilus influenzae type b (Hib) vaccine.** / Consult your health care provider. **Family history and personal history of risk and conditions may change your health care provider's recommendations. Document Released: 11/01/2001 Document Revised: 09/10/2013 Document Reviewed: 01/31/2011 [facility] Patient Information 2015 [place], [place]. This information is not intended to replace advice given to you by your health care provider. Make sure you discuss any questions you have with your health care provider.

## 2014-10-06 NOTE — Progress Notes (Signed)
Subjective:    Andrew Ramos is a 81 y.o. male who presents for Medicare Annual/Subsequent preventive examination.   Preventive Screening-Counseling & Management  Tobacco History  Smoking status  . Never Smoker   Smokeless tobacco  . Never Used    Problems Prior to Visit 1. none  Current Problems (verified) Patient Active Problem List   Diagnosis Date Noted  . Prostate cancer   . Melanoma   . Nonmelanoma skin cancer   . Polyposis of colon   . Influenza-like illness 09/30/2013  . Obesity (BMI 30-39.9) 05/10/2013  . Obese 06/14/2012  . S/P right THA, AA 06/12/2012  . PMR (polymyalgia rheumatica) 05/10/2011  . EXTERNAL HEMORRHOIDS 06/09/2010  . DIVERTICULOSIS, COLON 06/09/2010  . COLONIC POLYPS, HX OF 06/09/2010  . UNSPECIFIED VITAMIN D DEFICIENCY 05/18/2010  . TOTAL KNEE REPLACEMENT, LEFT, HX OF 01/22/2009  . Unspecified Hearing Loss 03/13/2008  . THYROID NODULE, HX OF 08/14/2007  . PROSTATE CANCER, HX OF 06/18/2007  . HYPERLIPIDEMIA 02/28/2007  . HYPERTENSION 02/28/2007  . OSTEOARTHRITIS 02/28/2007    Medications Prior to Visit Current Outpatient Prescriptions on File Prior to Visit  Medication Sig Dispense Refill  . alendronate (FOSAMAX) 70 MG tablet Take 70 mg by mouth every 7 (seven) days. Take with a full glass of water on an empty stomach every Friday    . Calcium Carbonate-Vitamin D (CALCIUM 600 + D PO) Take 1 tablet by mouth every morning.     . Cholecalciferol (VITAMIN D3) 1000 UNITS tablet Take 1,000 Units by mouth every morning.     Marland Kitchen CRESTOR 20 MG tablet TAKE 1 TABLET DAILY (REPEAT LABS ARE DUE NOW) 90 tablet 0  . docusate sodium 100 MG CAPS Take 100 mg by mouth 2 (two) times daily. 10 capsule   . glucose blood (FREESTYLE LITE) test strip Check blood sugar once daily. Dx 250.00 100 each 12  . lisinopril-hydrochlorothiazide (PRINZIDE,ZESTORETIC) 10-12.5 MG per tablet TAKE 1 TABLET DAILY (OFFICE VISIT DUE NOW) 90 tablet 0  . methotrexate (RHEUMATREX)  2.5 MG tablet Take 15 mg by mouth once a week.    . Multiple Vitamins-Minerals (CENTRUM SILVER ULTRA MENS) TABS Take 1 tablet by mouth every evening.     . terazosin (HYTRIN) 1 MG capsule Take 1 capsule (1 mg total) by mouth at bedtime. 90 capsule 3   No current facility-administered medications on file prior to visit.    Current Medications (verified) Current Outpatient Prescriptions  Medication Sig Dispense Refill  . alendronate (FOSAMAX) 70 MG tablet Take 70 mg by mouth every 7 (seven) days. Take with a full glass of water on an empty stomach every Friday    . Calcium Carbonate-Vitamin D (CALCIUM 600 + D PO) Take 1 tablet by mouth every morning.     . Cholecalciferol (VITAMIN D3) 1000 UNITS tablet Take 1,000 Units by mouth every morning.     Marland Kitchen CRESTOR 20 MG tablet TAKE 1 TABLET DAILY (REPEAT LABS ARE DUE NOW) 90 tablet 0  . docusate sodium 100 MG CAPS Take 100 mg by mouth 2 (two) times daily. 10 capsule   . glucose blood (FREESTYLE LITE) test strip Check blood sugar once daily. Dx 250.00 100 each 12  . Lancets (FREESTYLE) lancets   11  . lisinopril-hydrochlorothiazide (PRINZIDE,ZESTORETIC) 10-12.5 MG per tablet TAKE 1 TABLET DAILY (OFFICE VISIT DUE NOW) 90 tablet 0  . methotrexate (RHEUMATREX) 2.5 MG tablet Take 15 mg by mouth once a week.    . Multiple Vitamins-Minerals (CENTRUM SILVER ULTRA MENS)  TABS Take 1 tablet by mouth every evening.     Marland Kitchen aspirin 81 MG tablet Take 1 tablet (81 mg total) by mouth daily. 30 tablet   . terazosin (HYTRIN) 1 MG capsule Take 1 capsule (1 mg total) by mouth at bedtime. 90 capsule 3   No current facility-administered medications for this visit.     Allergies (verified) Review of patient's allergies indicates no known allergies.   PAST HISTORY  Family History Family History  Problem Relation Age of Onset  . Coronary artery disease Mother 14  . Heart disease Mother 38    MI  . Coronary artery disease Father 47  . Pancreatic cancer Father 87     deceased 6  . Cancer Father     pancreatic cancer  . Pancreatic cancer Brother 68    deceased 44; smoker  . Cancer Brother     pancreatic cancer    Social History History  Substance Use Topics  . Smoking status: Never Smoker   . Smokeless tobacco: Never Used  . Alcohol Use: Yes     Comment: very occasional    Are there smokers in your home (other than you)?  No  Risk Factors Current exercise habits: The patient does not participate in regular exercise at present.  Dietary issues discussed: na   Cardiac risk factors: advanced age (older than 28 for men, 31 for women), diabetes mellitus, dyslipidemia, family history of premature cardiovascular disease, hypertension, male gender and sedentary lifestyle.  Depression Screen (Note: if answer to either of the following is "Yes", a more complete depression screening is indicated)   Q1: Over the past two weeks, have you felt down, depressed or hopeless? No  Q2: Over the past two weeks, have you felt little interest or pleasure in doing things? No  Have you lost interest or pleasure in daily life? No  Do you often feel hopeless? No  Do you cry easily over simple problems? No  Activities of Daily Living In your present state of health, do you have any difficulty performing the following activities?:  Driving? No Managing money?  No Feeding yourself? No Getting from bed to chair? No Climbing a flight of stairs? No Preparing food and eating?: No Bathing or showering? No Getting dressed: No Getting to the toilet? No Using the toilet:No Moving around from place to place: No In the past year have you fallen or had a near fall?:No   Are you sexually active?  Yes  Do you have more than one partner?  No  Hearing Difficulties: yes-- hearing aids Do you often ask people to speak up or repeat themselves? Yes Do you experience ringing or noises in your ears? No Do you have difficulty understanding soft or whispered voices? Yes   Do  you feel that you have a problem with memory? No  Do you often misplace items? No  Do you feel safe at home?  Yes  Cognitive Testing  Alert? Yes  Normal Appearance?Yes  Oriented to person? Yes  Place? Yes   Time? Yes  Recall of three objects?  Yes  Can perform simple calculations? Yes  Displays appropriate judgment?Yes  Can read the correct time from a watch face?Yes   Advanced Directives have been discussed with the patient? Yes   List the Names of Other Physician/Practitioners you currently use: 1.  [facility]th--[facility] 2. Dentist--[place], [place] 3. Urology-- [facility] 4. GI--[facility] 5. Rheum-- [facility]ekman 6.  [facility] Schillings 7.  [place]-- endo 8 . Derm-- Delman Cheadle 9  Podiatry--[facility] Indicate any recent Medical Services you may have received from other than [facility] providers in the past year (date may be approximate).  Immunization History  Administered Date(s) Administered  . H1N1 10/21/2008  . Influenza Split 07/13/2011, 07/11/2012  . Influenza Whole 06/25/2008, 06/15/2009, 06/15/2011  . Influenza, High Dose Seasonal PF 08/14/2013  . Influenza,inj,Quad PF,36+ Mos 06/26/2014  . Pneumococcal Polysaccharide-23 09/26/2001  . Td 10/02/2002    Screening Tests Health Maintenance  Topic Date Due  . FOOT EXAM  01/27/1942  . OPHTHALMOLOGY EXAM  01/27/1942  . URINE MICROALBUMIN  01/27/1942  . COLONOSCOPY  12/05/2014 (Originally 09/07/2013)  . HEMOGLOBIN A1C  10/19/2014  . INFLUENZA VACCINE  04/20/2015  . TETANUS/TDAP  09/11/2022  . PNEUMOCOCCAL POLYSACCHARIDE VACCINE AGE 36 AND OVER  Completed  . ZOSTAVAX  Addressed    All answers were reviewed with the patient and necessary referrals were made:  Garnet Koyanagi, DO   10/06/2014   History reviewed:  He  has a past medical history of Hypertension; Hyperlipidemia; Colon polyps; Thyroid disease (sees dr altzhimer for yearly); Prostate cancer (2007); Polymyalgia rheumatica; Hepatitis; Osteoarthritis; Prostate cancer; Melanoma; Nonmelanoma skin  cancer; and Polyposis of colon. He  does not have any pertinent problems on file. He  has past surgical history that includes Total knee arthroplasty (12/31/2008); Total hip arthroplasty (08/27/2008); Tonsillectomy (as child); Total hip arthroplasty (06/12/2012); Colonoscopy; and Polypectomy. His family history includes Cancer in his brother and father; Coronary artery disease (age of onset: 16) in his mother; Coronary artery disease (age of onset: 90) in his father; Heart disease (age of onset: 3) in his mother; Pancreatic cancer (age of onset: 57) in his brother; Pancreatic cancer (age of onset: 4) in his father. He  reports that he has never smoked. He has never used smokeless tobacco. He reports that he drinks alcohol. He reports that he does not use illicit drugs. He has a current medication list which includes the following prescription(s): alendronate, calcium carb-cholecalciferol, cholecalciferol, crestor, dss, glucose blood, freestyle, lisinopril-hydrochlorothiazide, methotrexate, centrum silver ultra mens, aspirin, and terazosin. Current Outpatient Prescriptions on File Prior to Visit  Medication Sig Dispense Refill  . alendronate (FOSAMAX) 70 MG tablet Take 70 mg by mouth every 7 (seven) days. Take with a full glass of water on an empty stomach every Friday    . Calcium Carbonate-Vitamin D (CALCIUM 600 + D PO) Take 1 tablet by mouth every morning.     . Cholecalciferol (VITAMIN D3) 1000 UNITS tablet Take 1,000 Units by mouth every morning.     Marland Kitchen CRESTOR 20 MG tablet TAKE 1 TABLET DAILY (REPEAT LABS ARE DUE NOW) 90 tablet 0  . docusate sodium 100 MG CAPS Take 100 mg by mouth 2 (two) times daily. 10 capsule   . glucose blood (FREESTYLE LITE) test strip Check blood sugar once daily. Dx 250.00 100 each 12  . lisinopril-hydrochlorothiazide (PRINZIDE,ZESTORETIC) 10-12.5 MG per tablet TAKE 1 TABLET DAILY (OFFICE VISIT DUE NOW) 90 tablet 0  . methotrexate (RHEUMATREX) 2.5 MG tablet Take 15 mg by  mouth once a week.    . Multiple Vitamins-Minerals (CENTRUM SILVER ULTRA MENS) TABS Take 1 tablet by mouth every evening.     . terazosin (HYTRIN) 1 MG capsule Take 1 capsule (1 mg total) by mouth at bedtime. 90 capsule 3   No current facility-administered medications on file prior to visit.   He has No Known Allergies.  Review of Systems  Review of Systems  Constitutional: Negative for activity change, appetite  change and fatigue.  HENT: Negative for, congestion, tinnitus and ear discharge.  + hearing aids Eyes: Negative for visual disturbance (see optho q1y -- vision corrected to 20/20 with glasses).  Respiratory: Negative for cough, chest tightness and shortness of breath.   Cardiovascular: Negative for chest pain, palpitations and leg swelling.  Gastrointestinal: Negative for abdominal pain, diarrhea, constipation and abdominal distention.  Genitourinary: Negative for urgency, frequency, decreased urine volume and difficulty urinating.  Musculoskeletal: Negative for back pain, arthralgias and gait problem.  Skin: Negative for color change, pallor and rash.  Neurological: Negative for dizziness, light-headedness, numbness and headaches.  Hematological: Negative for adenopathy. Does not bruise/bleed easily.  Psychiatric/Behavioral: Negative for suicidal ideas, confusion, sleep disturbance, self-injury, dysphoric mood, decreased concentration and agitation.  Pt is able to read and write and can do all ADLs No risk for falling No abuse/ violence in home     Objective:     Vision by Snellen chart: opth Blood pressure 138/80, pulse 98, temperature 98.2 F (36.8 C), temperature source Oral, height 6' (1.829 m), weight 224 lb (101.606 kg), SpO2 97 %. Body mass index is 30.37 kg/(m^2).  BP 138/80 mmHg  Pulse 98  Temp(Src) 98.2 F (36.8 C) (Oral)  Ht 6' (1.829 m)  Wt 224 lb (101.606 kg)  BMI 30.37 kg/m2  SpO2 97% General appearance: alert, cooperative, appears stated age and no  distress Head: Normocephalic, without obvious abnormality, atraumatic Eyes: negative findings: lids and lashes normal, conjunctivae and sclerae normal and pupils equal, round, reactive to light and accomodation Ears: normal TM&#39;s and external ear canals both ears Nose: Nares normal. Septum midline. Mucosa normal. No drainage or sinus tenderness. Throat: lips, mucosa, and tongue normal; teeth and gums normal Neck: no adenopathy, no carotid bruit, no JVD, supple, symmetrical, trachea midline and thyroid not enlarged, symmetric, no tenderness/mass/nodules Back: symmetric, no curvature. ROM normal. No CVA tenderness. Lungs: clear to auscultation bilaterally Chest wall: no tenderness Heart: regular rate and rhythm, S1, S2 normal, no murmur, click, rub or gallop Abdomen: soft, non-tender; bowel sounds normal; no masses,  no organomegaly Male genitalia: deferred--urology Rectal: deferred--- urology Extremities: extremities normal, atraumatic, no cyanosis or edema Pulses: 2+ and symmetric Skin: Skin color, texture, turgor normal. No rashes or lesions ---mult SK-- sees derm regularly Lymph nodes: Cervical, supraclavicular, and axillary nodes normal. Neurologic: Alert and oriented X 3, normal strength and tone. Normal symmetric reflexes. Normal coordination and gait Psych-no depression, no anxiety      Assessment:      cpe   Plan:     During the course of the visit the patient was educated and counseled about appropriate screening and preventive services including:    Pneumococcal vaccine   Influenza vaccine  Prostate cancer screening  Colorectal cancer screening  Diabetes screening  Glaucoma screening  Advanced directives: has an advanced directive - a copy HAS NOT been provided.  Diet review for nutrition referral? Yes ____  Not Indicated _x___   Patient Instructions (the written plan) was given to the patient.  Medicare Attestation I have personally reviewed: The  patient's medical and social history Their use of alcohol, tobacco or illicit drugs Their current medications and supplements The patient's functional ability including ADLs,fall risks, home safety risks, cognitive, and hearing and visual impairment Diet and physical activities Evidence for depression or mood disorders  The patient's weight, height, BMI, and visual acuity have been recorded in the chart.  I have made referrals, counseling, and provided education to the patient based  on review of the above and I have provided the patient with a written personalized care plan for preventive services.    1. Diabetes mellitus type II, controlled Check labs today--- diet controlled - Hemoglobin A1c  2. Hyperlipidemia con't crestor - Hepatic function panel - Lipid panel - POCT urinalysis dipstick - Microalbumin / creatinine urine ratio - aspirin 81 MG tablet; Take 1 tablet (81 mg total) by mouth daily.  Dispense: 30 tablet  3. Essential hypertension Stable, con't lisinopril - Basic metabolic panel - CBC with Differential - POCT urinalysis dipstick - Microalbumin / creatinine urine ratio  4. Prostate cancer con't hytrin--- per urology - PSA  5. Medicare annual wellness visit, subsequent  6. PMR-- per rheum  Garnet Koyanagi, DO   10/06/2014

## 2014-10-06 NOTE — Assessment & Plan Note (Signed)
Per endo °

## 2014-10-06 NOTE — Progress Notes (Signed)
Pre visit review using our clinic review tool, if applicable. No additional management support is needed unless otherwise documented below in the visit note. 

## 2014-10-18 ENCOUNTER — Other Ambulatory Visit: Payer: Self-pay | Admitting: Family Medicine

## 2014-11-04 ENCOUNTER — Encounter: Payer: Self-pay | Admitting: Internal Medicine

## 2014-11-04 ENCOUNTER — Ambulatory Visit (INDEPENDENT_AMBULATORY_CARE_PROVIDER_SITE_OTHER): Payer: Medicare Other | Admitting: Internal Medicine

## 2014-11-04 VITALS — BP 142/62 | HR 76 | Ht 72.0 in | Wt 220.4 lb

## 2014-11-04 DIAGNOSIS — K5900 Constipation, unspecified: Secondary | ICD-10-CM | POA: Diagnosis not present

## 2014-11-04 DIAGNOSIS — Z86010 Personal history of colonic polyps: Secondary | ICD-10-CM

## 2014-11-04 MED ORDER — MOVIPREP 100 G PO SOLR: 1.0000 | Freq: Once | ORAL | Status: DC

## 2014-11-04 NOTE — Progress Notes (Signed)
HISTORY OF PRESENT ILLNESS:  Andrew Ramos is a 81 y.o. male with past medical history as listed below, including polymyalgia rheumatica for which he is on methotrexate. He presents today regarding worsening constipation and the need for surveillance colonoscopy. The patient has undergone multiple prior colonoscopies with a history of adenomatous colon polyps. His last colonoscopy was performed 09/07/2012 with Dr. Verl Blalock. Examination revealed 9 sessile colon polyps ranging between 5 and 9 mm. These were found to be both sessile serrated adenoma as well as tubular adenomas. Follow-up in one year recommended. Patient follows up at this time. In terms of his bowel habits, he reports progressive constipation over the past year. He had been using stool softeners daily, but has discontinued such. He remains quite active walking his dog's regularly as well as exercising that the gymnasium. He denies rectal bleeding or unexplained weight loss. He is concerned about the prospects of colon cancer having had a personal history of melanoma and a family history of pancreatic cancer in 2 siblings. He is retired from [employer] and [employer]:  All non-GI ROS negative except for decreased hearing and arthritis  Past Medical History  Diagnosis Date  . Hypertension   . Hyperlipidemia   . Colon polyps     Tubular Adenoma 2005  . Thyroid disease sees dr altzhimer for yearly    nodules  . Prostate cancer 2007    s/p radiation seed implants  . Polymyalgia rheumatica   . Hepatitis     pt told by [facility] has hepatitis antibodies in blood   . Osteoarthritis     right hip- none since hip replacement  . Prostate cancer   . Melanoma   . Nonmelanoma skin cancer   . Polyposis of colon   . Diverticulosis     Past Surgical History  Procedure Laterality Date  . Total knee arthroplasty  12/31/2008    left  . Total hip arthroplasty  08/27/2008    left  . Tonsillectomy  as child  .  Total hip arthroplasty  06/12/2012    Procedure: TOTAL HIP ARTHROPLASTY ANTERIOR APPROACH;  Surgeon: Mauri Pole, MD;  Location: [facility];  Service: Orthopedics;  Laterality: Right;  . Colonoscopy    . Polypectomy      Social History Braulio Kiedrowski Mateja  reports that he has never smoked. He has never used smokeless tobacco. He reports that he drinks alcohol. He reports that he does not use illicit drugs.  family history includes Coronary artery disease (age of onset: 43) in his mother; Coronary artery disease (age of onset: 73) in his father; Heart disease (age of onset: 50) in his mother; Pancreatic cancer (age of onset: 54) in his brother; Pancreatic cancer (age of onset: 42) in his father. There is no history of Colon cancer, Colon polyps, Esophageal cancer, Kidney disease, Diabetes, or Gallbladder disease.  No Known Allergies     PHYSICAL EXAMINATION: Vital signs: BP 142/62 mmHg  Pulse 76  Ht 6' (1.829 m)  Wt 220 lb 6 oz (99.961 kg)  BMI 29.88 kg/m2  Constitutional: generally well-appearing, no acute distress Psychiatric: alert and oriented x3, cooperative Eyes: extraocular movements intact, anicteric, conjunctiva pink Mouth: oral pharynx moist, no lesions Neck: supple no lymphadenopathy Cardiovascular: heart regular rate and rhythm, no murmur Lungs: clear to auscultation bilaterally Abdomen: soft, nontender, nondistended, no obvious ascites, no peritoneal signs, normal bowel sounds, no organomegaly Rectal: Deferred until colonoscopy Extremities: no lower extremity edema bilaterally  Skin: no lesions on visible extremities Neuro: No focal deficits. Bilateral hearing aids.    ASSESSMENT:  #1. Functional constipation. Progressive #2. Multiple adenomatous and sessile serrated polyps. Overdue for follow-up. Appropriate candidate without contraindication. Motivated for colonoscopy   PLAN:  #1. MiraLAX 17 g in 8 ounces of water daily. Titrate to desired effect as we discussed in  detail #2. Surveillance colonoscopy.The nature of the procedure, as well as the risks, benefits, and alternatives were carefully and thoroughly reviewed with the patient. Ample time for discussion and questions allowed. The patient understood, was satisfied, and agreed to proceed.

## 2014-11-04 NOTE — Patient Instructions (Signed)
You have been scheduled for a colonoscopy. Please follow written instructions given to you at your visit today.  Please pick up your prep kit at the pharmacy within the next 1-3 days. If you use inhalers (even only as needed), please bring them with you on the day of your procedure. Your physician has requested that you go to www.startemmi.com and enter the access code given to you at your visit today. This web site gives a general overview about your procedure. However, you should still follow specific instructions given to you by our office regarding your preparation for the procedure.   Begin taking 1 capful of Miralax in 8-10 ounces of juice or water daily.

## 2014-11-10 ENCOUNTER — Other Ambulatory Visit: Payer: Self-pay | Admitting: Family Medicine

## 2014-11-10 ENCOUNTER — Telehealth: Payer: Self-pay | Admitting: Internal Medicine

## 2014-11-10 NOTE — Telephone Encounter (Signed)
If he does not feel well, okay to reschedule

## 2014-11-10 NOTE — Telephone Encounter (Signed)
Called pt back to tell him Dr. Henrene Pastor said if pt does not feel well ,it is ok to reschedule procedure for tomorrow 11/11/14.

## 2014-11-10 NOTE — Telephone Encounter (Signed)
Pt. Called 11/10/14/@0810  to say he had sore throat 2 days ago (saturday) and chills and cough and sore throat yesterday (Sunday). Patient wants to know if he should cancel his procedure for tomorrow am . Pt has not taken his temperature.but feels he is running fever.

## 2014-11-11 ENCOUNTER — Ambulatory Visit (INDEPENDENT_AMBULATORY_CARE_PROVIDER_SITE_OTHER): Payer: Medicare Other | Admitting: Medical

## 2014-11-11 ENCOUNTER — Encounter: Payer: Self-pay | Admitting: Medical

## 2014-11-11 ENCOUNTER — Encounter: Payer: TRICARE For Life (TFL) | Admitting: Internal Medicine

## 2014-11-11 VITALS — BP 149/77 | HR 83 | Temp 98.0°F | Ht 72.0 in | Wt 222.4 lb

## 2014-11-11 DIAGNOSIS — J069 Acute upper respiratory infection, unspecified: Secondary | ICD-10-CM | POA: Diagnosis not present

## 2014-11-11 MED ORDER — BENZONATATE 100 MG PO CAPS: 100.0000 mg | ORAL_CAPSULE | Freq: Three times a day (TID) | ORAL | Status: DC | PRN

## 2014-11-11 MED ORDER — AZITHROMYCIN 250 MG PO TABS: ORAL_TABLET | ORAL | Status: DC

## 2014-11-11 MED ORDER — FLUTICASONE PROPIONATE 50 MCG/ACT NA SUSP: 2.0000 | Freq: Every day | NASAL | Status: DC

## 2014-11-11 NOTE — Assessment & Plan Note (Signed)
Vs allergic rhinitis. Mild st may be from pnd found on exam. Rx flonase for nasal congestion and benzonatate for the cough.  If any sinus pressure, productive cough or worsening st then start azithromycin. Note use of methotrexate may predispose you to infections.  Follow up 7 days or as needed.

## 2014-11-11 NOTE — Progress Notes (Signed)
Subjective:    Patient ID: Andrew Ramos, male    DOB: 10-10-1931, 81 y.o.   MRN: [id number]  HPI   Pt in with recent st since Saturday. He has recent dry cough since Sunday. Pt feels tickle on back of throat. Occasional mild sneeze. No allergies this time of the year. Mild sensation of chills yesterday. Pt has some granchildren youngest 79 yo and 79 yo. (But no contact)    Review of Systems  Constitutional: Positive for chills and fatigue. Negative for fever and diaphoresis.       Some fatigue since Saturday.  HENT: Positive for sore throat.   Respiratory: Positive for cough. Negative for chest tightness and wheezing.   Cardiovascular: Negative for chest pain and palpitations.  Musculoskeletal: Negative for back pain, neck pain and neck stiffness.       No diffuse myalgias.  Neurological: Negative for dizziness, syncope, facial asymmetry, speech difficulty, weakness, light-headedness, numbness and headaches.  Hematological: Negative for adenopathy. Does not bruise/bleed easily.    Past Medical History  Diagnosis Date  . Hypertension   . Hyperlipidemia   . Colon polyps     Tubular Adenoma 2005  . Thyroid disease sees dr altzhimer for yearly    nodules  . Prostate cancer 2007    s/p radiation seed implants  . Polymyalgia rheumatica   . Hepatitis     pt told by [facility] has hepatitis antibodies in blood   . Osteoarthritis     right hip- none since hip replacement  . Prostate cancer   . Melanoma   . Nonmelanoma skin cancer   . Polyposis of colon   . Diverticulosis     History   Social History  . Marital Status: Married    Spouse Name: N/A  . Number of Children: 2  . Years of Education: N/A   Occupational History  . retired Hotel manager    Social History Main Topics  . Smoking status: Never Smoker   . Smokeless tobacco: Never Used  . Alcohol Use: 0.0 oz/week    0 Standard drinks or equivalent per week     Comment: occasional  . Drug Use: No  . Sexual  Activity:    Partners: Female   Other Topics Concern  . Not on file   Social History Narrative   Exercise--no    Past Surgical History  Procedure Laterality Date  . Total knee arthroplasty  12/31/2008    left  . Total hip arthroplasty  08/27/2008    left  . Tonsillectomy  as child  . Total hip arthroplasty  06/12/2012    Procedure: TOTAL HIP ARTHROPLASTY ANTERIOR APPROACH;  Surgeon: Shelda Pal, MD;  Location: [facility];  Service: Orthopedics;  Laterality: Right;  . Colonoscopy    . Polypectomy      Family History  Problem Relation Age of Onset  . Coronary artery disease Mother 16  . Heart disease Mother 1    MI  . Coronary artery disease Father 9  . Pancreatic cancer Father 13    deceased 17  . Pancreatic cancer Brother 42    deceased 55; smoker  . Colon cancer Neg Hx   . Colon polyps Neg Hx   . Esophageal cancer Neg Hx   . Kidney disease Neg Hx   . Diabetes Neg Hx   . Gallbladder disease Neg Hx     No Known Allergies  Current Outpatient Prescriptions on File Prior to Visit  Medication Sig Dispense  Refill  . alendronate (FOSAMAX) 70 MG tablet Take 70 mg by mouth every 7 (seven) days. Take with a full glass of water on an empty stomach every Friday    . aspirin 81 MG tablet Take 1 tablet (81 mg total) by mouth daily. 30 tablet   . Calcium Carbonate-Vitamin D (CALCIUM 600 + D PO) Take 1 tablet by mouth every morning.     . Cholecalciferol (VITAMIN D3) 1000 UNITS tablet Take 1,000 Units by mouth every morning.     Marland Kitchen glucose blood (FREESTYLE LITE) test strip Check blood sugar once daily. Dx 250.00 (Patient taking differently: Check blood sugar two times a week. Dx 250.00) 100 each 12  . Lancets (FREESTYLE) lancets   11  . lisinopril-hydrochlorothiazide (PRINZIDE,ZESTORETIC) 10-12.5 MG per tablet Take 1 tablet by mouth daily. 90 tablet 3  . methotrexate (RHEUMATREX) 2.5 MG tablet Take 15 mg by mouth once a week.    Marland Kitchen MOVIPREP 100 G SOLR Take 1 kit (200 g total) by  mouth once. 1 kit 0  . Multiple Vitamins-Minerals (CENTRUM SILVER ULTRA MENS) TABS Take 1 tablet by mouth every evening.     . rosuvastatin (CRESTOR) 20 MG tablet Take 1 tablet (20 mg total) by mouth daily. 90 tablet 1  . terazosin (HYTRIN) 1 MG capsule Take 1 capsule (1 mg total) by mouth at bedtime. 90 capsule 3  . docusate sodium 100 MG CAPS Take 100 mg by mouth 2 (two) times daily. (Patient not taking: Reported on 11/11/2014) 10 capsule    No current facility-administered medications on file prior to visit.    BP 149/77 mmHg  Pulse 83  Temp(Src) 98 F (36.7 C) (Oral)  Ht 6' (1.829 m)  Wt 222 lb 6.4 oz (100.88 kg)  BMI 30.16 kg/m2  SpO2 98%       Objective:   Physical Exam General  Mental Status - Alert. General Appearance - Well groomed. Not in acute distress.  Skin Rashes- No Rashes.  HEENT Head- Normal. Ear Auditory Canal - Left- Normal. Right - Normal.Tympanic Membrane- Left- Normal. Right- Normal. Eye Sclera/Conjunctiva- Left- Normal. Right- Normal. Nose & Sinuses Nasal Mucosa- Left-  Boggy and Congested. Right-  Boggy and  Congested.No biilateral maxillary aor  frontal sinus pressure. Mouth & Throat Lips: Upper Lip- Normal: no dryness, cracking, pallor, cyanosis, or vesicular eruption. Lower Lip-Normal: no dryness, cracking, pallor, cyanosis or vesicular eruption. Buccal Mucosa- Bilateral- No Aphthous ulcers. Oropharynx- No Discharge or Erythema. +pnd. Tonsils: Characteristics- Bilateral- Faint  Erythema + Congestion. Size/Enlargement- Bilateral- No enlargement. Discharge- bilateral-None.  Neck Neck- Supple. No Masses.   Chest and Lung Exam Auscultation: Breath Sounds:-Clear even and unlabored.  Cardiovascular Auscultation:Rythm- Regular, rate and rhythm. Murmurs & Other Heart Sounds:Ausculatation of the heart reveal- No Murmurs.  Lymphatic Head & Neck General Head & Neck Lymphatics: Bilateral: Description- No Localized lymphadenopathy.         Assessment & Plan:

## 2014-11-11 NOTE — Patient Instructions (Signed)
Acute upper respiratory infection Vs allergic rhinitis. Mild st may be from pnd found on exam. Rx flonase for nasal congestion and benzonatate for the cough.  If any sinus pressure, productive cough or worsening st then start azithromycin. Note use of methotrexate may predispose you to infections.  Follow up 7 days or as needed.

## 2014-11-11 NOTE — Progress Notes (Signed)
Pre visit review using our clinic review tool, if applicable. No additional management support is needed unless otherwise documented below in the visit note. 

## 2014-11-24 ENCOUNTER — Ambulatory Visit (AMBULATORY_SURGERY_CENTER): Payer: Medicare Other | Admitting: Internal Medicine

## 2014-11-24 ENCOUNTER — Encounter: Payer: Self-pay | Admitting: Internal Medicine

## 2014-11-24 VITALS — BP 111/46 | HR 56 | Temp 97.1°F | Resp 24 | Ht 72.0 in | Wt 220.0 lb

## 2014-11-24 DIAGNOSIS — D122 Benign neoplasm of ascending colon: Secondary | ICD-10-CM

## 2014-11-24 DIAGNOSIS — Z86010 Personal history of colonic polyps: Secondary | ICD-10-CM

## 2014-11-24 DIAGNOSIS — D128 Benign neoplasm of rectum: Secondary | ICD-10-CM | POA: Diagnosis not present

## 2014-11-24 DIAGNOSIS — D120 Benign neoplasm of cecum: Secondary | ICD-10-CM | POA: Diagnosis not present

## 2014-11-24 DIAGNOSIS — I10 Essential (primary) hypertension: Secondary | ICD-10-CM | POA: Diagnosis not present

## 2014-11-24 DIAGNOSIS — D129 Benign neoplasm of anus and anal canal: Secondary | ICD-10-CM

## 2014-11-24 MED ORDER — SODIUM CHLORIDE 0.9 % IV SOLN: 500.0000 mL | INTRAVENOUS | Status: DC

## 2014-11-24 NOTE — Progress Notes (Signed)
A/ox3, pleased with MAC, report to RN 

## 2014-11-24 NOTE — Progress Notes (Signed)
Called to room to assist during endoscopic procedure.  Patient ID and intended procedure confirmed with present staff. Received instructions for my participation in the procedure from the performing physician.  

## 2014-11-24 NOTE — Progress Notes (Signed)
Patient denies any allergies to eggs or soy. 

## 2014-11-24 NOTE — Op Note (Signed)
[facility]  [address] [place], [id number]   COLONOSCOPY PROCEDURE REPORT  PATIENT: Andrew Ramos, Andrew Ramos  MR#: [id number] BIRTHDATE: 1931-12-12 , 82  yrs. old GENDER: male ENDOSCOPIST: Eustace Quail, MD REFERRED QQ:[id number] Program Recall PROCEDURE DATE:  11/24/2014 PROCEDURE:   Colonoscopy, surveillance and Colonoscopy with snare polypectomy x 10 First Screening Colonoscopy - Avg.  risk and is 50 yrs.  old or older - No.  Prior Negative Screening - Now for repeat screening. N/A  History of Adenoma - Now for follow-up colonoscopy & has been > or = to 3 yrs.  Yes hx of adenoma.  Has been 3 or more years since last colonoscopy.  Polyps Removed Today? Yes. ASA CLASS:   Class II INDICATIONS:Surveillance due to prior colonic neoplasia and PH Colon Adenoma.  Multiple prior exams. Last 08-2012 with 9 polyps (SSP<TAs) MEDICATIONS: Monitored anesthesia care and Propofol 150 mg IV  DESCRIPTION OF PROCEDURE:   After the risks benefits and alternatives of the procedure were thoroughly explained, informed consent was obtained.  The digital rectal exam revealed no abnormalities of the rectum.   The LB CF-[id number] Loaner [id number] endoscope was introduced through the anus and advanced to the cecum, which was identified by both the appendix and ileocecal valve. No adverse events experienced.   The quality of the prep was good, using MoviPrep  The instrument was then slowly withdrawn as the colon was fully examined.  COLON FINDINGS: Ten polyps ranging between 3-73mm in size were found in the rectum, ascending colon, and at the cecum.  A polypectomy was performed with a cold snare.  The resection was complete, the polyp tissue was completely retrieved and sent to histology. Melanosis coli was found.   There was moderate diverticulosis noted in the left colon.   The examination was otherwise normal. Retroflexed views revealed no abnormalities. The time to cecum = 3.2  Withdrawal time = 19.1   The scope was withdrawn and the procedure completed. COMPLICATIONS: There were no immediate complications.  ENDOSCOPIC IMPRESSION: 1.   Ten polyps were found in the rectum, ascending colon, and cecum; polypectomy was performed with a cold snare 2.   Melanosis coli 3.   Moderate diverticulosis was noted in the left colon 4.   The examination was otherwise normal  RECOMMENDATIONS: 1. Repeat Colonoscopy in 1 year.  eSigned:  Eustace Quail, MD 11/24/2014 9:47 AM   cc: Rosalita Chessman, DO and The Patient   PATIENT NAME:  [name], [name] MR#: [id number]

## 2014-11-24 NOTE — Patient Instructions (Signed)
YOU HAD AN ENDOSCOPIC PROCEDURE TODAY AT [facility]:   Refer to the procedure report that was given to you for any specific questions about what was found during the examination.  If the procedure report does not answer your questions, please call your gastroenterologist to clarify.  If you requested that your care partner not be given the details of your procedure findings, then the procedure report has been included in a sealed envelope for you to review at your convenience later.  YOU SHOULD EXPECT: Some feelings of bloating in the abdomen. Passage of more gas than usual.  Walking can help get rid of the air that was put into your GI tract during the procedure and reduce the bloating. If you had a lower endoscopy (such as a colonoscopy or flexible sigmoidoscopy) you may notice spotting of blood in your stool or on the toilet paper. If you underwent a bowel prep for your procedure, you may not have a normal bowel movement for a few days.  Please Note:  You might notice some irritation and congestion in your nose or some drainage.  This is from the oxygen used during your procedure.  There is no need for concern and it should clear up in a day or so.  SYMPTOMS TO REPORT IMMEDIATELY:   Following lower endoscopy (colonoscopy or flexible sigmoidoscopy):  Excessive amounts of blood in the stool  Significant tenderness or worsening of abdominal pains  Swelling of the abdomen that is new, acute  Fever of 100F or higher   For urgent or emergent issues, a gastroenterologist can be reached at any hour by calling [phone].   DIET: Your first meal following the procedure should be a small meal and then it is ok to progress to your normal diet. Heavy or fried foods are harder to digest and may make you feel nauseous or bloated.  Likewise, meals heavy in dairy and vegetables can increase bloating.  Drink plenty of fluids but you should avoid alcoholic beverages for 24 hours. Try to  increase the fiber in your diet.  ACTIVITY:  You should plan to take it easy for the rest of today and you should NOT DRIVE or use heavy machinery until tomorrow (because of the sedation medicines used during the test).    FOLLOW UP: Our staff will call the number listed on your records the next business day following your procedure to check on you and address any questions or concerns that you may have regarding the information given to you following your procedure. If we do not reach you, we will leave a message.  However, if you are feeling well and you are not experiencing any problems, there is no need to return our call.  We will assume that you have returned to your regular daily activities without incident.  If any biopsies were taken you will be contacted by phone or by letter within the next 1-3 weeks.  Please call us at [phone] if you have not heard about the biopsies in 3 weeks.    SIGNATURES/CONFIDENTIALITY: You and/or your care partner have signed paperwork which will be entered into your electronic medical record.  These signatures attest to the fact that that the information above on your After Visit Summary has been reviewed and is understood.  Full responsibility of the confidentiality of this discharge information lies with you and/or your care-partner.  Recall colonoscopy in 1 year.  Please, read all of the handouts given to you by  your recovery room nurse.

## 2014-11-25 ENCOUNTER — Telehealth: Payer: Self-pay | Admitting: *Deleted

## 2014-11-25 NOTE — Telephone Encounter (Signed)
  Follow up Call-  Call back number 11/24/2014 09/07/2012  Post procedure Call Back phone  # [phone]  Permission to leave phone message Yes Yes    Number was incorrect; not able to leave message

## 2014-11-27 ENCOUNTER — Encounter: Payer: Self-pay | Admitting: Internal Medicine

## 2014-12-08 DIAGNOSIS — M810 Age-related osteoporosis without current pathological fracture: Secondary | ICD-10-CM | POA: Diagnosis not present

## 2014-12-08 DIAGNOSIS — M353 Polymyalgia rheumatica: Secondary | ICD-10-CM | POA: Diagnosis not present

## 2014-12-08 DIAGNOSIS — M150 Primary generalized (osteo)arthritis: Secondary | ICD-10-CM | POA: Diagnosis not present

## 2014-12-08 DIAGNOSIS — M0609 Rheumatoid arthritis without rheumatoid factor, multiple sites: Secondary | ICD-10-CM | POA: Diagnosis not present

## 2014-12-15 DIAGNOSIS — E042 Nontoxic multinodular goiter: Secondary | ICD-10-CM | POA: Diagnosis not present

## 2014-12-25 DIAGNOSIS — L570 Actinic keratosis: Secondary | ICD-10-CM | POA: Diagnosis not present

## 2014-12-25 DIAGNOSIS — Z800 Family history of malignant neoplasm of digestive organs: Secondary | ICD-10-CM | POA: Diagnosis not present

## 2014-12-25 DIAGNOSIS — Z85828 Personal history of other malignant neoplasm of skin: Secondary | ICD-10-CM | POA: Diagnosis not present

## 2014-12-25 DIAGNOSIS — D2271 Melanocytic nevi of right lower limb, including hip: Secondary | ICD-10-CM | POA: Diagnosis not present

## 2014-12-25 DIAGNOSIS — Z86018 Personal history of other benign neoplasm: Secondary | ICD-10-CM | POA: Diagnosis not present

## 2014-12-25 DIAGNOSIS — D225 Melanocytic nevi of trunk: Secondary | ICD-10-CM | POA: Diagnosis not present

## 2014-12-25 DIAGNOSIS — Z85820 Personal history of malignant melanoma of skin: Secondary | ICD-10-CM | POA: Diagnosis not present

## 2014-12-25 DIAGNOSIS — L821 Other seborrheic keratosis: Secondary | ICD-10-CM | POA: Diagnosis not present

## 2014-12-29 DIAGNOSIS — H524 Presbyopia: Secondary | ICD-10-CM | POA: Diagnosis not present

## 2014-12-29 DIAGNOSIS — H2513 Age-related nuclear cataract, bilateral: Secondary | ICD-10-CM | POA: Diagnosis not present

## 2014-12-29 DIAGNOSIS — H02412 Mechanical ptosis of left eyelid: Secondary | ICD-10-CM | POA: Diagnosis not present

## 2014-12-29 DIAGNOSIS — E119 Type 2 diabetes mellitus without complications: Secondary | ICD-10-CM | POA: Diagnosis not present

## 2014-12-29 DIAGNOSIS — H40013 Open angle with borderline findings, low risk, bilateral: Secondary | ICD-10-CM | POA: Diagnosis not present

## 2014-12-29 DIAGNOSIS — H5203 Hypermetropia, bilateral: Secondary | ICD-10-CM | POA: Diagnosis not present

## 2014-12-29 DIAGNOSIS — H25013 Cortical age-related cataract, bilateral: Secondary | ICD-10-CM | POA: Diagnosis not present

## 2014-12-29 DIAGNOSIS — H35363 Drusen (degenerative) of macula, bilateral: Secondary | ICD-10-CM | POA: Diagnosis not present

## 2014-12-29 DIAGNOSIS — D2312 Other benign neoplasm of skin of left eyelid, including canthus: Secondary | ICD-10-CM | POA: Diagnosis not present

## 2014-12-29 DIAGNOSIS — H43813 Vitreous degeneration, bilateral: Secondary | ICD-10-CM | POA: Diagnosis not present

## 2015-03-09 DIAGNOSIS — M353 Polymyalgia rheumatica: Secondary | ICD-10-CM | POA: Diagnosis not present

## 2015-03-09 DIAGNOSIS — M810 Age-related osteoporosis without current pathological fracture: Secondary | ICD-10-CM | POA: Diagnosis not present

## 2015-03-09 DIAGNOSIS — M0609 Rheumatoid arthritis without rheumatoid factor, multiple sites: Secondary | ICD-10-CM | POA: Diagnosis not present

## 2015-03-09 DIAGNOSIS — M150 Primary generalized (osteo)arthritis: Secondary | ICD-10-CM | POA: Diagnosis not present

## 2015-03-12 DIAGNOSIS — Z471 Aftercare following joint replacement surgery: Secondary | ICD-10-CM | POA: Diagnosis not present

## 2015-03-12 DIAGNOSIS — Z96642 Presence of left artificial hip joint: Secondary | ICD-10-CM | POA: Diagnosis not present

## 2015-03-16 ENCOUNTER — Other Ambulatory Visit: Payer: Self-pay

## 2015-04-06 ENCOUNTER — Ambulatory Visit (INDEPENDENT_AMBULATORY_CARE_PROVIDER_SITE_OTHER): Payer: Medicare Other | Admitting: Family Medicine

## 2015-04-06 ENCOUNTER — Encounter: Payer: Self-pay | Admitting: Family Medicine

## 2015-04-06 VITALS — BP 122/62 | HR 56 | Temp 97.7°F | Ht 72.0 in | Wt 214.8 lb

## 2015-04-06 DIAGNOSIS — E785 Hyperlipidemia, unspecified: Secondary | ICD-10-CM | POA: Diagnosis not present

## 2015-04-06 DIAGNOSIS — F329 Major depressive disorder, single episode, unspecified: Secondary | ICD-10-CM | POA: Diagnosis not present

## 2015-04-06 DIAGNOSIS — R739 Hyperglycemia, unspecified: Secondary | ICD-10-CM | POA: Diagnosis not present

## 2015-04-06 DIAGNOSIS — F32A Depression, unspecified: Secondary | ICD-10-CM

## 2015-04-06 DIAGNOSIS — M353 Polymyalgia rheumatica: Secondary | ICD-10-CM

## 2015-04-06 DIAGNOSIS — E1151 Type 2 diabetes mellitus with diabetic peripheral angiopathy without gangrene: Secondary | ICD-10-CM | POA: Insufficient documentation

## 2015-04-06 DIAGNOSIS — I10 Essential (primary) hypertension: Secondary | ICD-10-CM | POA: Diagnosis not present

## 2015-04-06 DIAGNOSIS — E119 Type 2 diabetes mellitus without complications: Secondary | ICD-10-CM

## 2015-04-06 LAB — LIPID PANEL
CHOL/HDL RATIO: 2
Cholesterol: 98 mg/dL (ref 0–200)
HDL: 44.8 mg/dL (ref >39.00)
LDL Cholesterol: 43 mg/dL (ref 0–99)
NonHDL: 53.2
Triglycerides: 50 mg/dL (ref 0.0–149.0)
VLDL: 10 mg/dL (ref 0.0–40.0)

## 2015-04-06 LAB — MICROALBUMIN / CREATININE URINE RATIO
Creatinine,U: 126.1 mg/dL
MICROALB/CREAT RATIO: 0.6 mg/g (ref 0.0–30.0)
Microalb, Ur: <0.7 mg/dL (ref 0.0–1.9)

## 2015-04-06 LAB — POCT URINALYSIS DIPSTICK
Bilirubin, UA: NEGATIVE
Glucose, UA: NEGATIVE
Ketones, UA: NEGATIVE
LEUKOCYTES UA: NEGATIVE
Nitrite, UA: NEGATIVE
PH UA: 6
PROTEIN UA: NEGATIVE
RBC UA: NEGATIVE
SPEC GRAV UA: 1.02
UROBILINOGEN UA: 0.2

## 2015-04-06 LAB — HEMOGLOBIN A1C: Hgb A1c MFr Bld: 5.7 % (ref 4.6–6.5)

## 2015-04-06 LAB — BASIC METABOLIC PANEL
BUN: 20 mg/dL (ref 6–23)
CHLORIDE: 105 meq/L (ref 96–112)
CO2: 30 mEq/L (ref 19–32)
Calcium: 10 mg/dL (ref 8.4–10.5)
Creatinine, Ser: 0.93 mg/dL (ref 0.40–1.50)
GFR: 82.84 mL/min (ref >60.00)
Glucose, Bld: 103 mg/dL — ABNORMAL HIGH (ref 70–99)
POTASSIUM: 3.8 meq/L (ref 3.5–5.1)
Sodium: 141 mEq/L (ref 135–145)

## 2015-04-06 LAB — HEPATIC FUNCTION PANEL
ALK PHOS: 51 U/L (ref 39–117)
ALT: 25 U/L (ref 0–53)
AST: 22 U/L (ref 0–37)
Albumin: 4.1 g/dL (ref 3.5–5.2)
BILIRUBIN DIRECT: 0.2 mg/dL (ref 0.0–0.3)
BILIRUBIN TOTAL: 0.8 mg/dL (ref 0.2–1.2)
Total Protein: 6.3 g/dL (ref 6.0–8.3)

## 2015-04-06 MED ORDER — VENLAFAXINE HCL ER 37.5 MG PO CP24: ORAL_CAPSULE | ORAL | Status: DC

## 2015-04-06 NOTE — Patient Instructions (Signed)

## 2015-04-06 NOTE — Progress Notes (Signed)
Patient ID: Andrew Ramos, male    DOB: February 21, 1932  Age: 81 y.o. MRN: [id number]    Subjective:  Subjective HPI Andrew Ramos presents for f/u bp and cholesterol.  No cp sob or palpitations.   He is c/o some moodiness and loses patience with wife quickly.  She started on effexor and is doing well --- he would like to try it as well.   Review of Systems  Constitutional: Negative for diaphoresis, appetite change, fatigue and unexpected weight change.  Eyes: Negative for pain, redness and visual disturbance.  Respiratory: Negative for cough, chest tightness, shortness of breath and wheezing.   Cardiovascular: Negative for chest pain, palpitations and leg swelling.  Endocrine: Negative for cold intolerance, heat intolerance, polydipsia, polyphagia and polyuria.  Genitourinary: Negative for dysuria, frequency and difficulty urinating.  Neurological: Negative for dizziness, light-headedness, numbness and headaches.  Psychiatric/Behavioral: Positive for dysphoric mood. The patient is not nervous/anxious.     History Past Medical History  Diagnosis Date  . Hypertension   . Hyperlipidemia   . Colon polyps     Tubular Adenoma 2005  . Thyroid disease sees dr altzhimer for yearly    nodules  . Prostate cancer 2007    s/p radiation seed implants  . Polymyalgia rheumatica   . Hepatitis     pt told by [facility] has hepatitis antibodies in blood   . Osteoarthritis     right hip- none since hip replacement  . Prostate cancer   . Melanoma   . Nonmelanoma skin cancer   . Polyposis of colon   . Diverticulosis     He has past surgical history that includes Total knee arthroplasty (12/31/2008); Total hip arthroplasty (08/27/2008); Tonsillectomy (as child); Total hip arthroplasty (06/12/2012); Colonoscopy; and Polypectomy.   His family history includes Coronary artery disease (age of onset: 50) in his mother; Coronary artery disease (age of onset: 29) in his father; Heart disease (age of  onset: 45) in his mother; Pancreatic cancer (age of onset: 21) in his brother; Pancreatic cancer (age of onset: 36) in his father. There is no history of Colon cancer, Colon polyps, Esophageal cancer, Kidney disease, Diabetes, or Gallbladder disease.He reports that he has never smoked. He has never used smokeless tobacco. He reports that he drinks alcohol. He reports that he does not use illicit drugs.  Current Outpatient Prescriptions on File Prior to Visit  Medication Sig Dispense Refill  . alendronate (FOSAMAX) 70 MG tablet Take 70 mg by mouth every 7 (seven) days. Take with a full glass of water on an empty stomach every Friday    . aspirin 81 MG tablet Take 1 tablet (81 mg total) by mouth daily. 30 tablet   . Calcium Carbonate-Vitamin D (CALCIUM 600 + D PO) Take 1 tablet by mouth every morning.     . Cholecalciferol (VITAMIN D3) 1000 UNITS tablet Take 1,000 Units by mouth every morning.     . docusate sodium 100 MG CAPS Take 100 mg by mouth 2 (two) times daily. 10 capsule   . fluticasone (FLONASE) 50 MCG/ACT nasal spray Place 2 sprays into both nostrils daily. 16 g 1  . glucose blood (FREESTYLE LITE) test strip Check blood sugar once daily. Dx 250.00 (Patient taking differently: Check blood sugar two times a week. Dx 250.00) 100 each 12  . Lancets (FREESTYLE) lancets   11  . lisinopril-hydrochlorothiazide (PRINZIDE,ZESTORETIC) 10-12.5 MG per tablet Take 1 tablet by mouth daily. 90 tablet 3  . methotrexate (RHEUMATREX) 2.5  MG tablet Take 15 mg by mouth once a week.    . Multiple Vitamins-Minerals (CENTRUM SILVER ULTRA MENS) TABS Take 1 tablet by mouth every evening.     . polyethylene glycol powder (GLYCOLAX/MIRALAX) powder Take 1 Container by mouth daily. 1 scoop daily    . rosuvastatin (CRESTOR) 20 MG tablet Take 1 tablet (20 mg total) by mouth daily. 90 tablet 1  . terazosin (HYTRIN) 1 MG capsule Take 1 capsule (1 mg total) by mouth at bedtime. 90 capsule 3   No current  facility-administered medications on file prior to visit.     Objective:  Objective Physical Exam  Constitutional: He is oriented to person, place, and time. Vital signs are normal. He appears well-developed and well-nourished. He is sleeping.  HENT:  Head: Normocephalic and atraumatic.  Mouth/Throat: Oropharynx is clear and moist.  Eyes: EOM are normal. Pupils are equal, round, and reactive to light.  Neck: Normal range of motion. Neck supple. No thyromegaly present.  Cardiovascular: Normal rate and regular rhythm.   No murmur heard. Pulmonary/Chest: Effort normal and breath sounds normal. No respiratory distress. He has no wheezes. He has no rales. He exhibits no tenderness.  Musculoskeletal: He exhibits no edema or tenderness.  Neurological: He is alert and oriented to person, place, and time.  Skin: Skin is warm and dry.  Psychiatric: He has a normal mood and affect. His behavior is normal. Judgment and thought content normal.  Sensory exam of the foot is normal, tested with the monofilament. Good pulses, no lesions or ulcers, good peripheral pulses.  BP 122/62 mmHg  Pulse 56  Temp(Src) 97.7 F (36.5 C) (Oral)  Ht 6' (1.829 m)  Wt 214 lb 12.8 oz (97.433 kg)  BMI 29.13 kg/m2  SpO2 98% Wt Readings from Last 3 Encounters:  04/06/15 214 lb 12.8 oz (97.433 kg)  11/24/14 220 lb (99.791 kg)  11/11/14 222 lb 6.4 oz (100.88 kg)     Lab Results  Component Value Date   WBC 4.1 10/06/2014   HGB 13.9 10/06/2014   HCT 42.8 10/06/2014   PLT 172.0 10/06/2014   GLUCOSE 110* 10/06/2014   CHOL 105 10/06/2014   TRIG 68.0 10/06/2014   HDL 44.30 10/06/2014   LDLCALC 47 10/06/2014   ALT 27 10/06/2014   AST 27 10/06/2014   NA 138 10/06/2014   K 3.9 10/06/2014   CL 102 10/06/2014   CREATININE 1.07 10/06/2014   BUN 19 10/06/2014   CO2 28 10/06/2014   TSH 1.63 05/10/2010   PSA 0.08* 10/06/2014   INR 0.95 06/11/2012   HGBA1C 6.0 10/06/2014   MICROALBUR 0.4 10/06/2014    Dg Chest  2 View  10/08/2013   CLINICAL DATA:  Cough for 1 week.  EXAM: CHEST  2 VIEW  COMPARISON:  06/11/2012  FINDINGS: The heart size and mediastinal contours are within normal limits. Both lungs are clear. The bony thorax is intact.  IMPRESSION: No active cardiopulmonary disease.   Electronically Signed   By: Lajean Manes M.D.   On: 10/08/2013 15:44     Assessment & Plan:  Plan I am having Andrew Ramos start on venlafaxine XR. I am also having him maintain his CENTRUM SILVER ULTRA MENS, cholecalciferol, alendronate, Calcium Carbonate-Vitamin D (CALCIUM 600 + D PO), DSS, methotrexate, glucose blood, terazosin, freestyle, aspirin, lisinopril-hydrochlorothiazide, rosuvastatin, polyethylene glycol powder, and fluticasone.  Meds ordered this encounter  Medications  . venlafaxine XR (EFFEXOR XR) 37.5 MG 24 hr capsule    Sig: 1 po  qd x 1 week then 2 po qd    Dispense:  60 capsule    Refill:  0    Problem List Items Addressed This Visit    PMR (polymyalgia rheumatica)    Per rheum      Hyperlipidemia - Primary    con't crestor Check labs      Relevant Orders   Basic metabolic panel   Hepatic function panel   Lipid panel   Basic metabolic panel   Hepatic function panel   Lipid panel   Hemoglobin A1c   POCT urinalysis dipstick   Microalbumin / creatinine urine ratio   Essential hypertension    Stable con't lisinopril      Relevant Orders   Basic metabolic panel   Hepatic function panel   Lipid panel   Basic metabolic panel   Hepatic function panel   Lipid panel   Hemoglobin A1c   POCT urinalysis dipstick   Microalbumin / creatinine urine ratio   Diabetes mellitus type II, controlled    Diet controlled Check labs       Other Visit Diagnoses    Hyperglycemia        Relevant Orders    Basic metabolic panel    Hepatic function panel    Lipid panel    Hemoglobin A1c    POCT urinalysis dipstick    Microalbumin / creatinine urine ratio    Depression        Relevant  Medications    venlafaxine XR (EFFEXOR XR) 37.5 MG 24 hr capsule       Follow-up: Return in about 6 months (around 10/07/2015), or if symptoms worsen or fail to improve, for hypertension, hyperlipidemia, diabetes II.  Garnet Koyanagi, DO

## 2015-04-06 NOTE — Assessment & Plan Note (Signed)
con't crestor Check labs

## 2015-04-06 NOTE — Progress Notes (Signed)
Pre visit review using our clinic review tool, if applicable. No additional management support is needed unless otherwise documented below in the visit note. 

## 2015-04-06 NOTE — Assessment & Plan Note (Signed)
Diet controlled. Check labs.  

## 2015-04-06 NOTE — Assessment & Plan Note (Signed)
Stable con't lisinopril  

## 2015-04-06 NOTE — Assessment & Plan Note (Signed)
Per rheum 

## 2015-05-04 ENCOUNTER — Other Ambulatory Visit: Payer: Self-pay | Admitting: Family Medicine

## 2015-06-08 DIAGNOSIS — M810 Age-related osteoporosis without current pathological fracture: Secondary | ICD-10-CM | POA: Diagnosis not present

## 2015-06-08 DIAGNOSIS — M0609 Rheumatoid arthritis without rheumatoid factor, multiple sites: Secondary | ICD-10-CM | POA: Diagnosis not present

## 2015-06-08 DIAGNOSIS — M150 Primary generalized (osteo)arthritis: Secondary | ICD-10-CM | POA: Diagnosis not present

## 2015-06-08 DIAGNOSIS — M353 Polymyalgia rheumatica: Secondary | ICD-10-CM | POA: Diagnosis not present

## 2015-06-15 ENCOUNTER — Telehealth: Payer: Self-pay

## 2015-06-15 NOTE — Telephone Encounter (Signed)
Pharmacy med refill request via fax  Effexor XR  Last filled:  05/05/15 Amt: 60, 5   Too soon to refill.

## 2015-06-18 ENCOUNTER — Other Ambulatory Visit: Payer: Self-pay

## 2015-06-18 ENCOUNTER — Telehealth: Payer: Self-pay | Admitting: Family Medicine

## 2015-06-18 MED ORDER — ROSUVASTATIN CALCIUM 20 MG PO TABS: 20.0000 mg | ORAL_TABLET | Freq: Every day | ORAL | Status: DC

## 2015-06-18 NOTE — Telephone Encounter (Signed)
Rx sent to [facility], #90 and 1 refill.

## 2015-06-18 NOTE — Telephone Encounter (Signed)
Relation to pt: self  Call back number: [phone] (H) Pharmacy: [facility], [place] [phone] (Phone) [phone] (Fax)         Reason for call:  Patient requesting a refill rosuvastatin (CRESTOR) 20 MG tablet

## 2015-06-19 ENCOUNTER — Other Ambulatory Visit: Payer: Self-pay

## 2015-06-19 MED ORDER — VENLAFAXINE HCL ER 37.5 MG PO CP24: 75.0000 mg | ORAL_CAPSULE | Freq: Every day | ORAL | Status: DC

## 2015-06-25 DIAGNOSIS — D2271 Melanocytic nevi of right lower limb, including hip: Secondary | ICD-10-CM | POA: Diagnosis not present

## 2015-06-25 DIAGNOSIS — D2272 Melanocytic nevi of left lower limb, including hip: Secondary | ICD-10-CM | POA: Diagnosis not present

## 2015-06-25 DIAGNOSIS — Z86018 Personal history of other benign neoplasm: Secondary | ICD-10-CM | POA: Diagnosis not present

## 2015-06-25 DIAGNOSIS — D485 Neoplasm of uncertain behavior of skin: Secondary | ICD-10-CM | POA: Diagnosis not present

## 2015-06-25 DIAGNOSIS — Z85820 Personal history of malignant melanoma of skin: Secondary | ICD-10-CM | POA: Diagnosis not present

## 2015-06-25 DIAGNOSIS — L570 Actinic keratosis: Secondary | ICD-10-CM | POA: Diagnosis not present

## 2015-06-25 DIAGNOSIS — D225 Melanocytic nevi of trunk: Secondary | ICD-10-CM | POA: Diagnosis not present

## 2015-06-25 DIAGNOSIS — Z85828 Personal history of other malignant neoplasm of skin: Secondary | ICD-10-CM | POA: Diagnosis not present

## 2015-07-03 DIAGNOSIS — H25013 Cortical age-related cataract, bilateral: Secondary | ICD-10-CM | POA: Diagnosis not present

## 2015-07-03 DIAGNOSIS — H2513 Age-related nuclear cataract, bilateral: Secondary | ICD-10-CM | POA: Diagnosis not present

## 2015-07-03 DIAGNOSIS — H02412 Mechanical ptosis of left eyelid: Secondary | ICD-10-CM | POA: Diagnosis not present

## 2015-07-03 DIAGNOSIS — Z809 Family history of malignant neoplasm, unspecified: Secondary | ICD-10-CM | POA: Diagnosis not present

## 2015-07-03 DIAGNOSIS — H35363 Drusen (degenerative) of macula, bilateral: Secondary | ICD-10-CM | POA: Diagnosis not present

## 2015-07-03 DIAGNOSIS — I10 Essential (primary) hypertension: Secondary | ICD-10-CM | POA: Diagnosis not present

## 2015-07-03 DIAGNOSIS — E119 Type 2 diabetes mellitus without complications: Secondary | ICD-10-CM | POA: Diagnosis not present

## 2015-07-03 DIAGNOSIS — H40013 Open angle with borderline findings, low risk, bilateral: Secondary | ICD-10-CM | POA: Diagnosis not present

## 2015-07-03 DIAGNOSIS — H5203 Hypermetropia, bilateral: Secondary | ICD-10-CM | POA: Diagnosis not present

## 2015-07-03 DIAGNOSIS — H524 Presbyopia: Secondary | ICD-10-CM | POA: Diagnosis not present

## 2015-07-03 DIAGNOSIS — D2312 Other benign neoplasm of skin of left eyelid, including canthus: Secondary | ICD-10-CM | POA: Diagnosis not present

## 2015-07-03 DIAGNOSIS — H43813 Vitreous degeneration, bilateral: Secondary | ICD-10-CM | POA: Diagnosis not present

## 2015-07-07 ENCOUNTER — Ambulatory Visit (INDEPENDENT_AMBULATORY_CARE_PROVIDER_SITE_OTHER): Payer: Medicare Other

## 2015-07-07 DIAGNOSIS — Z23 Encounter for immunization: Secondary | ICD-10-CM | POA: Diagnosis not present

## 2015-07-15 DIAGNOSIS — L905 Scar conditions and fibrosis of skin: Secondary | ICD-10-CM | POA: Diagnosis not present

## 2015-07-15 DIAGNOSIS — D485 Neoplasm of uncertain behavior of skin: Secondary | ICD-10-CM | POA: Diagnosis not present

## 2015-07-17 ENCOUNTER — Telehealth: Payer: Self-pay | Admitting: Family Medicine

## 2015-07-17 NOTE — Telephone Encounter (Signed)
°  Relation to GE:FUWT Call back number:[phone] [facility]  Reason for call: pt states he received a letter from [facility] stating his insurance will no longer cover  Crestor, and has provided him with 3 other rx that can be covered by his insurance. Atorvastatin 40 mg or greater, simvastatin 80 mg, and gen caduet of 40 mg or more, pt would like to know which one dr. Etter Sjogren suggest that he starts to take and to go ahead and send to [facility].

## 2015-07-17 NOTE — Telephone Encounter (Signed)
lipitor 40 mg #90 1 refills Labs in 3 months

## 2015-07-17 NOTE — Telephone Encounter (Signed)
Msg left to call the office     KP 

## 2015-07-17 NOTE — Telephone Encounter (Signed)
Please advise      KP 

## 2015-07-20 MED ORDER — ATORVASTATIN CALCIUM 40 MG PO TABS: 40.0000 mg | ORAL_TABLET | Freq: Every day | ORAL | Status: DC

## 2015-07-20 NOTE — Telephone Encounter (Signed)
Patient has been made aware and verbalized understanding that Lipitor 40 po qd will be sent and he will call in 3 mos for his apt.     KP

## 2015-07-29 DIAGNOSIS — C61 Malignant neoplasm of prostate: Secondary | ICD-10-CM | POA: Diagnosis not present

## 2015-08-05 DIAGNOSIS — R3916 Straining to void: Secondary | ICD-10-CM | POA: Diagnosis not present

## 2015-08-05 DIAGNOSIS — C61 Malignant neoplasm of prostate: Secondary | ICD-10-CM | POA: Diagnosis not present

## 2015-08-05 DIAGNOSIS — N401 Enlarged prostate with lower urinary tract symptoms: Secondary | ICD-10-CM | POA: Diagnosis not present

## 2015-09-07 DIAGNOSIS — M810 Age-related osteoporosis without current pathological fracture: Secondary | ICD-10-CM | POA: Diagnosis not present

## 2015-09-07 DIAGNOSIS — M0609 Rheumatoid arthritis without rheumatoid factor, multiple sites: Secondary | ICD-10-CM | POA: Diagnosis not present

## 2015-09-07 DIAGNOSIS — M353 Polymyalgia rheumatica: Secondary | ICD-10-CM | POA: Diagnosis not present

## 2015-09-07 DIAGNOSIS — M150 Primary generalized (osteo)arthritis: Secondary | ICD-10-CM | POA: Diagnosis not present

## 2015-09-09 DIAGNOSIS — M216X2 Other acquired deformities of left foot: Secondary | ICD-10-CM | POA: Diagnosis not present

## 2015-09-09 DIAGNOSIS — D2372 Other benign neoplasm of skin of left lower limb, including hip: Secondary | ICD-10-CM | POA: Diagnosis not present

## 2015-09-09 DIAGNOSIS — M2042 Other hammer toe(s) (acquired), left foot: Secondary | ICD-10-CM | POA: Diagnosis not present

## 2015-10-08 ENCOUNTER — Ambulatory Visit: Payer: TRICARE For Life (TFL) | Admitting: Family Medicine

## 2015-10-09 ENCOUNTER — Telehealth: Payer: Self-pay | Admitting: Family Medicine

## 2015-10-09 NOTE — Telephone Encounter (Signed)
Called to follow up with patient. He says he did not go to ER.  Says he was told by [facility] that he did not need to go.  He says that he's doing okay, just has some bruises and some scrapes on his hands.  He said he fell on wet grass and slid onto some concrete in his driveway.  He says he did not hit his head.  Denied pain, confusion, changes in vision, dizziness/lightheadedness, chest pain, shortness of breath, sharp pain on inspiration, nausea/vomiting, excessive fatigue and weakness.    Advice:  Clean scrapes with soap and water.  Apply antibiotic ointment and bandages to keep open wounds clean. Go to ER if symptoms worsen or new symptoms develop.  He stated understanding and agreed to comply.    Message routed to PCP for FYI.

## 2015-10-09 NOTE — Telephone Encounter (Signed)
Pt called stating he fell out the front door on the concrete, he is bleeding/scratched hands and face, transferred to Margarita Grizzle at [facility].

## 2015-10-09 NOTE — Telephone Encounter (Signed)
[facility] - Client [facility] Patient Name: Andrew Ramos DOB: 05-30-1932 Initial Comment Caller states pt fell and landed on the cement Nurse Assessment Nurse: Martyn Ehrich, RN, [place] Date/Time (Eastern Time): 10/09/2015 3:42:41 PM Confirm and document reason for call. If symptomatic, describe symptoms. You must click the next button to save text entered. ---PT fell and went skidding down the sidewalk. Forehead and bridge of nose. Lost skin on R thumb and L ring finger and pinky bled a lot. Has the patient traveled out of the country within the last 30 days? ---No Does the patient have any new or worsening symptoms? ---Yes Will a triage be completed? ---Yes Related visit to physician within the last 2 weeks? ---No Does the PT have any chronic conditions? (i.e. diabetes, asthma, etc.) ---NoIs this a behavioral health or substance abuse call? ---No Guidelines Guideline Title Affirmed Question Affirmed Notes Head Injury Scalp swelling, bruise or pain (all triage questions negative) Nose Injury Nose swelling, bruise or pain Finger Injury Skin is split open or gaping (or length > 1/2 inch or 12 mm) Final Disposition User Go to ED Now Martyn Ehrich, RN, [place] [facility] Disagree/Comply: Comply Disagree/Comply: Comply Disagree/Comply: ComplyCall Id: [id number]

## 2015-10-10 NOTE — Telephone Encounter (Signed)
noted 

## 2015-10-11 ENCOUNTER — Encounter (HOSPITAL_BASED_OUTPATIENT_CLINIC_OR_DEPARTMENT_OTHER): Payer: Self-pay | Admitting: *Deleted

## 2015-10-11 ENCOUNTER — Emergency Department (HOSPITAL_BASED_OUTPATIENT_CLINIC_OR_DEPARTMENT_OTHER)
Admission: EM | Admit: 2015-10-11 | Discharge: 2015-10-11 | Disposition: A | Discharge status: Home / Self Care | Payer: Medicare Other | Attending: Emergency Medicine | Admitting: Emergency Medicine

## 2015-10-11 ENCOUNTER — Emergency Department (HOSPITAL_BASED_OUTPATIENT_CLINIC_OR_DEPARTMENT_OTHER): Payer: Medicare Other

## 2015-10-11 DIAGNOSIS — Z85820 Personal history of malignant melanoma of skin: Secondary | ICD-10-CM | POA: Diagnosis not present

## 2015-10-11 DIAGNOSIS — Y9289 Other specified places as the place of occurrence of the external cause: Secondary | ICD-10-CM | POA: Diagnosis not present

## 2015-10-11 DIAGNOSIS — S0081XA Abrasion of other part of head, initial encounter: Secondary | ICD-10-CM | POA: Insufficient documentation

## 2015-10-11 DIAGNOSIS — Y9301 Activity, walking, marching and hiking: Secondary | ICD-10-CM | POA: Insufficient documentation

## 2015-10-11 DIAGNOSIS — S60415A Abrasion of left ring finger, initial encounter: Secondary | ICD-10-CM | POA: Diagnosis not present

## 2015-10-11 DIAGNOSIS — R000 Tachycardia, unspecified: Secondary | ICD-10-CM | POA: Diagnosis not present

## 2015-10-11 DIAGNOSIS — S50312A Abrasion of left elbow, initial encounter: Secondary | ICD-10-CM | POA: Insufficient documentation

## 2015-10-11 DIAGNOSIS — I10 Essential (primary) hypertension: Secondary | ICD-10-CM | POA: Diagnosis not present

## 2015-10-11 DIAGNOSIS — W4909XA Other specified item causing external constriction, initial encounter: Secondary | ICD-10-CM | POA: Insufficient documentation

## 2015-10-11 DIAGNOSIS — Z79899 Other long term (current) drug therapy: Secondary | ICD-10-CM | POA: Insufficient documentation

## 2015-10-11 DIAGNOSIS — S60417A Abrasion of left little finger, initial encounter: Secondary | ICD-10-CM | POA: Insufficient documentation

## 2015-10-11 DIAGNOSIS — Z7982 Long term (current) use of aspirin: Secondary | ICD-10-CM | POA: Insufficient documentation

## 2015-10-11 DIAGNOSIS — W010XXA Fall on same level from slipping, tripping and stumbling without subsequent striking against object, initial encounter: Secondary | ICD-10-CM | POA: Diagnosis not present

## 2015-10-11 DIAGNOSIS — S6992XA Unspecified injury of left wrist, hand and finger(s), initial encounter: Secondary | ICD-10-CM | POA: Diagnosis present

## 2015-10-11 DIAGNOSIS — M1990 Unspecified osteoarthritis, unspecified site: Secondary | ICD-10-CM | POA: Diagnosis not present

## 2015-10-11 DIAGNOSIS — E785 Hyperlipidemia, unspecified: Secondary | ICD-10-CM | POA: Insufficient documentation

## 2015-10-11 DIAGNOSIS — Y998 Other external cause status: Secondary | ICD-10-CM | POA: Diagnosis not present

## 2015-10-11 DIAGNOSIS — S60445A External constriction of left ring finger, initial encounter: Secondary | ICD-10-CM | POA: Diagnosis not present

## 2015-10-11 DIAGNOSIS — Z8719 Personal history of other diseases of the digestive system: Secondary | ICD-10-CM | POA: Diagnosis not present

## 2015-10-11 DIAGNOSIS — W19XXXA Unspecified fall, initial encounter: Secondary | ICD-10-CM

## 2015-10-11 DIAGNOSIS — Z8546 Personal history of malignant neoplasm of prostate: Secondary | ICD-10-CM | POA: Diagnosis not present

## 2015-10-11 DIAGNOSIS — T07XXXA Unspecified multiple injuries, initial encounter: Secondary | ICD-10-CM

## 2015-10-11 DIAGNOSIS — Z7951 Long term (current) use of inhaled steroids: Secondary | ICD-10-CM | POA: Diagnosis not present

## 2015-10-11 DIAGNOSIS — M7989 Other specified soft tissue disorders: Secondary | ICD-10-CM | POA: Diagnosis not present

## 2015-10-11 DIAGNOSIS — Z86010 Personal history of colonic polyps: Secondary | ICD-10-CM | POA: Insufficient documentation

## 2015-10-11 DIAGNOSIS — M79642 Pain in left hand: Secondary | ICD-10-CM | POA: Diagnosis not present

## 2015-10-11 NOTE — ED Provider Notes (Signed)
CSN: [id number]     Arrival date & time 10/11/15  1248 History   First MD Initiated Contact with Patient 10/11/15 1349     Chief Complaint  Patient presents with  . Fall     (Consider location/radiation/quality/duration/timing/severity/associated sxs/prior Treatment) HPI Comments: Slipped on wet grass, fell into side of house onto concrete Friday 4PM No LOC, no NV/numbness/weakness No pain No pain from fall, multiple abrasions to 4th, 5th finger of left hand, abrasion left elbow, left face Came today due to concerns of wound to left arm Keeping wounds clean, placing vasoline on them    Patient is a 82 y.o. male presenting with fall.  Fall This is a new problem. Pertinent negatives include no chest pain, no abdominal pain, no headaches and no shortness of breath.    Past Medical History  Diagnosis Date  . Hypertension   . Hyperlipidemia   . Colon polyps     Tubular Adenoma 2005  . Thyroid disease sees dr altzhimer for yearly    nodules  . Prostate cancer ([place]) 2007    s/p radiation seed implants  . Polymyalgia rheumatica ([place])   . Hepatitis     pt told by [facility] has hepatitis antibodies in blood   . Osteoarthritis     right hip- none since hip replacement  . Prostate cancer ([place])   . Melanoma ([place])   . Nonmelanoma skin cancer   . Polyposis of colon   . Diverticulosis    Past Surgical History  Procedure Laterality Date  . Total knee arthroplasty  12/31/2008    left  . Total hip arthroplasty  08/27/2008    left  . Tonsillectomy  as child  . Total hip arthroplasty  06/12/2012    Procedure: TOTAL HIP ARTHROPLASTY ANTERIOR APPROACH;  Surgeon: Mauri Pole, MD;  Location: [facility];  Service: Orthopedics;  Laterality: Right;  . Colonoscopy    . Polypectomy     Family History  Problem Relation Age of Onset  . Coronary artery disease Mother 22  . Heart disease Mother 4    MI  . Coronary artery disease Father 82  . Pancreatic cancer Father 10    deceased 46  .  Pancreatic cancer Brother 8    deceased 76; smoker  . Colon cancer Neg Hx   . Colon polyps Neg Hx   . Esophageal cancer Neg Hx   . Kidney disease Neg Hx   . Diabetes Neg Hx   . Gallbladder disease Neg Hx    Social History  Substance Use Topics  . Smoking status: Never Smoker   . Smokeless tobacco: Never Used  . Alcohol Use: 0.0 oz/week    0 Standard drinks or equivalent per week     Comment: occasional    Review of Systems  Constitutional: Negative for fever.  HENT: Negative for sore throat.   Eyes: Negative for visual disturbance.  Respiratory: Negative for shortness of breath.   Cardiovascular: Negative for chest pain.  Gastrointestinal: Negative for nausea, vomiting and abdominal pain.  Genitourinary: Negative for difficulty urinating.  Musculoskeletal: Negative for back pain, neck pain and neck stiffness.  Skin: Positive for wound. Negative for rash.  Neurological: Negative for dizziness, syncope, facial asymmetry, weakness, numbness and headaches.      Allergies  Review of patient's allergies indicates no known allergies.  Home Medications   Prior to Admission medications   Medication Sig Start Date End Date Taking? Authorizing Provider  alendronate (FOSAMAX) 70 MG tablet  Take 70 mg by mouth every 7 (seven) days. Take with a full glass of water on an empty stomach every Friday    Historical Provider, MD  aspirin 81 MG tablet Take 1 tablet (81 mg total) by mouth daily. 10/06/14   Rosalita Chessman, DO  atorvastatin (LIPITOR) 40 MG tablet Take 1 tablet (40 mg total) by mouth daily. 07/20/15   Rosalita Chessman, DO  Calcium Carbonate-Vitamin D (CALCIUM 600 + D PO) Take 1 tablet by mouth every morning.     Historical Provider, MD  Cholecalciferol (VITAMIN D3) 1000 UNITS tablet Take 1,000 Units by mouth every morning.     Historical Provider, MD  docusate sodium 100 MG CAPS Take 100 mg by mouth 2 (two) times daily. 06/14/12   Danae Orleans, PA-C  fluticasone (FLONASE) 50  MCG/ACT nasal spray Place 2 sprays into both nostrils daily. 11/11/14   Percell Miller Saguier, PA-C  glucose blood (FREESTYLE LITE) test strip Check blood sugar once daily. Dx 250.00 Patient taking differently: Check blood sugar two times a week. Dx 250.00 05/09/14   Rosalita Chessman, DO  Lancets (FREESTYLE) lancets  09/03/14   Historical Provider, MD  lisinopril-hydrochlorothiazide (PRINZIDE,ZESTORETIC) 10-12.5 MG per tablet Take 1 tablet by mouth daily. 10/20/14   Rosalita Chessman, DO  methotrexate (RHEUMATREX) 2.5 MG tablet Take 15 mg by mouth once a week.    Historical Provider, MD  Multiple Vitamins-Minerals (CENTRUM SILVER ULTRA MENS) TABS Take 1 tablet by mouth every evening.     Historical Provider, MD  polyethylene glycol powder (GLYCOLAX/MIRALAX) powder Take 1 Container by mouth daily. 1 scoop daily    Historical Provider, MD  terazosin (HYTRIN) 1 MG capsule Take 1 capsule (1 mg total) by mouth at bedtime. 10/06/14   Rosalita Chessman, DO  venlafaxine XR (EFFEXOR-XR) 37.5 MG 24 hr capsule Take 2 capsules (75 mg total) by mouth daily with breakfast. 06/19/15   Alferd Apa Lowne, DO   BP 141/89 mmHg  Pulse 82  Temp(Src) 98.3 F (36.8 C) (Oral)  Resp 20  Ht 6' (1.829 m)  Wt 225 lb (102.059 kg)  BMI 30.51 kg/m2  SpO2 100% Physical Exam  Constitutional: He is oriented to person, place, and time. He appears well-developed and well-nourished. No distress.  HENT:  Head: Normocephalic and atraumatic.  Eyes: Conjunctivae and EOM are normal.  Neck: Normal range of motion.  Cardiovascular: Normal rate, regular rhythm, normal heart sounds and intact distal pulses.  Exam reveals no gallop and no friction rub.   No murmur heard. Pulmonary/Chest: Effort normal and breath sounds normal. No respiratory distress. He has no wheezes. He has no rales.  Abdominal: Soft. He exhibits no distension. There is no tenderness. There is no guarding.  Musculoskeletal: He exhibits no edema.       Left hand: He exhibits tenderness  and bony tenderness (PIP). He exhibits normal range of motion and normal capillary refill. Lacerations: abrasions over DIP. Normal sensation noted. Decreased sensation is not present in the ulnar distribution. Normal strength noted.  Neurological: He is alert and oriented to person, place, and time.  Skin: Skin is warm and dry. Abrasion (forehead, nose, left small and ring finger, left upper arm above elbow, right dorsum of hand. All abrasions well healing, no surrounding erythema ) noted. No rash noted. He is not diaphoretic.  Nursing note and vitals reviewed.   ED Course  Procedures (including critical care time) Labs Review Labs Reviewed - No data to display  Imaging Review  Dg Hand Complete Left  10/11/2015  CLINICAL DATA:  Fall today with left hand pain. Pain and swelling localized to the region of the fourth and fifth fingers. Initial encounter. EXAM: LEFT HAND - COMPLETE 3+ VIEW COMPARISON:  None. FINDINGS: No acute fracture or dislocation is identified. Mild osteoarthritis noted involving the digits throughout the left hand. No bony lesions, erosions or destruction identified. Soft tissues are unremarkable. IMPRESSION: No acute fracture identified. Electronically Signed   By: Aletta Edouard M.D.   On: 10/11/2015 15:13   I have personally reviewed and evaluated these images and lab results as part of my medical decision-making.   EKG Interpretation None      MDM   Final diagnoses:  Multiple abrasions  Fall, initial encounter  External constriction of left ring finger, initial encounter   82 year old male with a history of hypertension, hyperlipidemia, thyroid disease, prostate cancer, polymyalgia rheumatica presents with concern for mechanical fall from standing on Friday with multiple abrasions.  Patient reports fall with no loss of consciousness, no headache, no nausea vomiting, is only on aspirin, and have low suspicion at this time for intracranial abnormality. Patient has no  midline neck tenderness, no numbness, no weakness and doubt spinal injury. He denies any other areas of pain or tenderness on exam. Mild tachycardia, likely secondary to pt anxiety upon walking into the ED improved on recheck and pt asymptomatic. Patient reports he came to the emergency department for evaluation of his wounds for concern of possible infection of his left upper arm abrasion. Patient has multiple abrasions without signs of surrounding erythema, appear to be well healing and clean. Patient has significant swelling to the left ring finger, with wedding band constricting drainage and increasing swelling and pain. Ring was removed with ring cutters with improvement of symptoms. X-ray left hand was obtained which showed no sign of fracture. Wounds were covered with Xeroform gauze, and wound care was discussed. Patient discharged in stable condition with understanding of reasons to return.     Gareth Morgan, MD 10/11/15 [phone]

## 2015-10-11 NOTE — ED Notes (Signed)
Pt reports he slipped on wet patch of grass while walking his dogs on Friday afternoon. Denies LOC. Abrasions to face, left hand and left arm

## 2015-10-11 NOTE — ED Notes (Signed)
At request of RN Hodgin, patient's wedding ring was cut off his left ring finger. Patient reported immediate relief to his finger.

## 2015-10-12 ENCOUNTER — Ambulatory Visit (INDEPENDENT_AMBULATORY_CARE_PROVIDER_SITE_OTHER): Payer: Medicare Other | Admitting: Family Medicine

## 2015-10-12 ENCOUNTER — Encounter: Payer: Self-pay | Admitting: Family Medicine

## 2015-10-12 VITALS — BP 122/72 | HR 89 | Temp 98.3°F | Ht 72.0 in | Wt 218.4 lb

## 2015-10-12 DIAGNOSIS — F329 Major depressive disorder, single episode, unspecified: Secondary | ICD-10-CM

## 2015-10-12 DIAGNOSIS — S0181XA Laceration without foreign body of other part of head, initial encounter: Secondary | ICD-10-CM | POA: Diagnosis not present

## 2015-10-12 DIAGNOSIS — F32A Depression, unspecified: Secondary | ICD-10-CM

## 2015-10-12 DIAGNOSIS — E785 Hyperlipidemia, unspecified: Secondary | ICD-10-CM

## 2015-10-12 DIAGNOSIS — Z23 Encounter for immunization: Secondary | ICD-10-CM

## 2015-10-12 DIAGNOSIS — S61412D Laceration without foreign body of left hand, subsequent encounter: Secondary | ICD-10-CM

## 2015-10-12 DIAGNOSIS — I10 Essential (primary) hypertension: Secondary | ICD-10-CM | POA: Diagnosis not present

## 2015-10-12 DIAGNOSIS — S61402D Unspecified open wound of left hand, subsequent encounter: Secondary | ICD-10-CM | POA: Diagnosis not present

## 2015-10-12 DIAGNOSIS — E1139 Type 2 diabetes mellitus with other diabetic ophthalmic complication: Secondary | ICD-10-CM | POA: Diagnosis not present

## 2015-10-12 LAB — POCT URINALYSIS DIPSTICK
BILIRUBIN UA: NEGATIVE
GLUCOSE UA: NEGATIVE
Ketones, UA: NEGATIVE
Leukocytes, UA: NEGATIVE
Nitrite, UA: NEGATIVE
Protein, UA: NEGATIVE
RBC UA: NEGATIVE
SPEC GRAV UA: 1.025
UROBILINOGEN UA: 0.2
pH, UA: 6

## 2015-10-12 MED ORDER — VENLAFAXINE HCL ER 75 MG PO CP24: 75.0000 mg | ORAL_CAPSULE | Freq: Every day | ORAL | Status: DC

## 2015-10-12 NOTE — Progress Notes (Addendum)
Patient ID: Andrew Ramos, male    DOB: 06/05/1932  Age: 82 y.o. MRN: [id number]    Subjective:  Subjective HPI Andrew Ramos presents for f/u dm, cholesterol and bp.  Pt had a fall while walking his 2 dogs.  He got tripped up with them and did a face plant on side walk.  He has mult abrasions on forehead , hands and L elbow.   Review of Systems  Constitutional: Negative for diaphoresis, appetite change, fatigue and unexpected weight change.  Eyes: Negative for pain, redness and visual disturbance.  Respiratory: Negative for cough, chest tightness, shortness of breath and wheezing.   Cardiovascular: Negative for chest pain, palpitations and leg swelling.  Endocrine: Negative for cold intolerance, heat intolerance, polydipsia, polyphagia and polyuria.  Genitourinary: Negative for dysuria, frequency and difficulty urinating.  Neurological: Negative for dizziness, light-headedness, numbness and headaches.    History Past Medical History  Diagnosis Date  . Hypertension   . Hyperlipidemia   . Colon polyps     Tubular Adenoma 2005  . Thyroid disease sees dr altzhimer for yearly    nodules  . Prostate cancer ([place]) 2007    s/p radiation seed implants  . Polymyalgia rheumatica ([place])   . Hepatitis     pt told by [facility] has hepatitis antibodies in blood   . Osteoarthritis     right hip- none since hip replacement  . Prostate cancer ([place])   . Melanoma ([place])   . Nonmelanoma skin cancer   . Polyposis of colon   . Diverticulosis     He has past surgical history that includes Total knee arthroplasty (12/31/2008); Total hip arthroplasty (08/27/2008); Tonsillectomy (as child); Total hip arthroplasty (06/12/2012); Colonoscopy; and Polypectomy.   His family history includes Coronary artery disease (age of onset: 90) in his mother; Coronary artery disease (age of onset: 26) in his father; Heart disease (age of onset: 64) in his mother; Pancreatic cancer (age of onset: 75) in his brother;  Pancreatic cancer (age of onset: 23) in his father. There is no history of Colon cancer, Colon polyps, Esophageal cancer, Kidney disease, Diabetes, or Gallbladder disease.He reports that he has never smoked. He has never used smokeless tobacco. He reports that he drinks alcohol. He reports that he does not use illicit drugs.  Current Outpatient Prescriptions on File Prior to Visit  Medication Sig Dispense Refill  . alendronate (FOSAMAX) 70 MG tablet Take 70 mg by mouth every 7 (seven) days. Take with a full glass of water on an empty stomach every Friday    . aspirin 81 MG tablet Take 1 tablet (81 mg total) by mouth daily. 30 tablet   . atorvastatin (LIPITOR) 40 MG tablet Take 1 tablet (40 mg total) by mouth daily. 90 tablet 1  . Calcium Carbonate-Vitamin D (CALCIUM 600 + D PO) Take 1 tablet by mouth every morning.     . Cholecalciferol (VITAMIN D3) 1000 UNITS tablet Take 1,000 Units by mouth every morning.     . docusate sodium 100 MG CAPS Take 100 mg by mouth 2 (two) times daily. 10 capsule   . glucose blood (FREESTYLE LITE) test strip Check blood sugar once daily. Dx 250.00 (Patient taking differently: Check blood sugar two times a week. Dx 250.00) 100 each 12  . Lancets (FREESTYLE) lancets   11  . lisinopril-hydrochlorothiazide (PRINZIDE,ZESTORETIC) 10-12.5 MG per tablet Take 1 tablet by mouth daily. 90 tablet 3  . methotrexate (RHEUMATREX) 2.5 MG tablet Take 15 mg by  mouth once a week.    . Multiple Vitamins-Minerals (CENTRUM SILVER ULTRA MENS) TABS Take 1 tablet by mouth every evening.     . polyethylene glycol powder (GLYCOLAX/MIRALAX) powder Take 1 Container by mouth daily. 1 scoop daily    . terazosin (HYTRIN) 1 MG capsule Take 1 capsule (1 mg total) by mouth at bedtime. 90 capsule 3  . venlafaxine XR (EFFEXOR-XR) 37.5 MG 24 hr capsule Take 2 capsules (75 mg total) by mouth daily with breakfast. 180 capsule 1   No current facility-administered medications on file prior to visit.       Objective:  Objective Physical Exam  Constitutional: He is oriented to person, place, and time. Vital signs are normal. He appears well-developed and well-nourished. He is sleeping.  HENT:  Head: Normocephalic and atraumatic.  Mouth/Throat: Oropharynx is clear and moist.  Eyes: EOM are normal. Pupils are equal, round, and reactive to light.  Neck: Normal range of motion. Neck supple. No thyromegaly present.  Cardiovascular: Normal rate and regular rhythm.   No murmur heard. Pulmonary/Chest: Effort normal and breath sounds normal. No respiratory distress. He has no wheezes. He has no rales. He exhibits no tenderness.  Musculoskeletal: He exhibits no edema or tenderness.  Neurological: He is alert and oriented to person, place, and time.  Skin: Skin is warm and dry.  Psychiatric: He has a normal mood and affect. His behavior is normal. Judgment and thought content normal.  Nursing note and vitals reviewed. Sensory exam of the foot is normal, tested with the monofilament. Good pulses, no lesions or ulcers, good peripheral pulses.  BP 122/72 mmHg  Pulse 89  Temp(Src) 98.3 F (36.8 C) (Oral)  Ht 6' (1.829 m)  Wt 218 lb 6.4 oz (99.066 kg)  BMI 29.61 kg/m2  SpO2 99% Wt Readings from Last 3 Encounters:  10/12/15 218 lb 6.4 oz (99.066 kg)  10/11/15 225 lb (102.059 kg)  04/06/15 214 lb 12.8 oz (97.433 kg)     Lab Results  Component Value Date   WBC 4.1 10/06/2014   HGB 13.9 10/06/2014   HCT 42.8 10/06/2014   PLT 172.0 10/06/2014   GLUCOSE 103* 04/06/2015   CHOL 98 04/06/2015   TRIG 50.0 04/06/2015   HDL 44.80 04/06/2015   LDLCALC 43 04/06/2015   ALT 25 04/06/2015   AST 22 04/06/2015   NA 141 04/06/2015   K 3.8 04/06/2015   CL 105 04/06/2015   CREATININE 0.93 04/06/2015   BUN 20 04/06/2015   CO2 30 04/06/2015   TSH 1.63 05/10/2010   PSA 0.08* 10/06/2014   INR 0.95 06/11/2012   HGBA1C 5.7 04/06/2015   MICROALBUR <0.7 04/06/2015    Dg Hand Complete Left  10/11/2015   CLINICAL DATA:  Fall today with left hand pain. Pain and swelling localized to the region of the fourth and fifth fingers. Initial encounter. EXAM: LEFT HAND - COMPLETE 3+ VIEW COMPARISON:  None. FINDINGS: No acute fracture or dislocation is identified. Mild osteoarthritis noted involving the digits throughout the left hand. No bony lesions, erosions or destruction identified. Soft tissues are unremarkable. IMPRESSION: No acute fracture identified. Electronically Signed   By: Aletta Edouard M.D.   On: 10/11/2015 15:13     Assessment & Plan:  Plan I have discontinued Andrew Ramos's fluticasone. I am also having him maintain his CENTRUM SILVER ULTRA MENS, cholecalciferol, alendronate, Calcium Carbonate-Vitamin D (CALCIUM 600 + D PO), DSS, methotrexate, glucose blood, terazosin, freestyle, aspirin, lisinopril-hydrochlorothiazide, polyethylene glycol powder, venlafaxine XR, and atorvastatin.  No orders of the defined types were placed in this encounter.    Problem List Items Addressed This Visit      Unprioritized   Hyperlipidemia   Relevant Orders   Comp Met (CMET)   Lipid panel   POCT urinalysis dipstick   Essential hypertension - Primary   Relevant Orders   Comp Met (CMET)   POCT urinalysis dipstick   Diabetes mellitus type II, controlled ([place])   Relevant Orders   Comp Met (CMET)   Hemoglobin A1c   POCT urinalysis dipstick      Follow-up: Return in about 6 months (around 04/10/2016), or if symptoms worsen or fail to improve, for annual exam, fasting.  Garnet Koyanagi, DO

## 2015-10-12 NOTE — Progress Notes (Signed)
Pre visit review using our clinic review tool, if applicable. No additional management support is needed unless otherwise documented below in the visit note. 

## 2015-10-12 NOTE — Addendum Note (Signed)
Addended by: Rosalita Chessman on: 10/12/2015 03:02 PM   Modules accepted: Orders, Medications

## 2015-10-12 NOTE — Patient Instructions (Signed)

## 2015-10-12 NOTE — Addendum Note (Signed)
Addended by: Ewing Schlein on: 10/12/2015 05:01 PM   Modules accepted: Orders

## 2015-10-13 LAB — COMPREHENSIVE METABOLIC PANEL
ALBUMIN: 4.2 g/dL (ref 3.5–5.2)
ALK PHOS: 67 U/L (ref 39–117)
ALT: 20 U/L (ref 0–53)
AST: 25 U/L (ref 0–37)
BILIRUBIN TOTAL: 0.8 mg/dL (ref 0.2–1.2)
BUN: 15 mg/dL (ref 6–23)
CALCIUM: 9.6 mg/dL (ref 8.4–10.5)
CO2: 28 meq/L (ref 19–32)
CREATININE: 0.94 mg/dL (ref 0.40–1.50)
Chloride: 102 mEq/L (ref 96–112)
GFR: 81.72 mL/min (ref >60.00)
Glucose, Bld: 93 mg/dL (ref 70–99)
Potassium: 3.9 mEq/L (ref 3.5–5.1)
Sodium: 138 mEq/L (ref 135–145)
Total Protein: 6.8 g/dL (ref 6.0–8.3)

## 2015-10-13 LAB — HEMOGLOBIN A1C: Hgb A1c MFr Bld: 5.6 % (ref 4.6–6.5)

## 2015-10-13 LAB — LIPID PANEL
CHOLESTEROL: 99 mg/dL (ref 0–200)
HDL: 47.1 mg/dL (ref >39.00)
LDL CALC: 40 mg/dL (ref 0–99)
NonHDL: 51.73
TRIGLYCERIDES: 57 mg/dL (ref 0.0–149.0)
Total CHOL/HDL Ratio: 2
VLDL: 11.4 mg/dL (ref 0.0–40.0)

## 2015-10-21 ENCOUNTER — Encounter: Payer: Self-pay | Admitting: Family Medicine

## 2015-10-27 ENCOUNTER — Encounter: Payer: Self-pay | Admitting: Medical

## 2015-10-27 ENCOUNTER — Telehealth: Payer: Self-pay | Admitting: Family Medicine

## 2015-10-27 ENCOUNTER — Ambulatory Visit (INDEPENDENT_AMBULATORY_CARE_PROVIDER_SITE_OTHER): Payer: Medicare Other | Admitting: Medical

## 2015-10-27 VITALS — BP 118/78 | HR 89 | Temp 98.1°F | Ht 72.0 in | Wt 219.0 lb

## 2015-10-27 DIAGNOSIS — R5383 Other fatigue: Secondary | ICD-10-CM | POA: Diagnosis not present

## 2015-10-27 DIAGNOSIS — B349 Viral infection, unspecified: Secondary | ICD-10-CM

## 2015-10-27 DIAGNOSIS — M791 Myalgia: Secondary | ICD-10-CM

## 2015-10-27 DIAGNOSIS — J029 Acute pharyngitis, unspecified: Secondary | ICD-10-CM | POA: Diagnosis not present

## 2015-10-27 DIAGNOSIS — IMO0001 Reserved for inherently not codable concepts without codable children: Secondary | ICD-10-CM

## 2015-10-27 DIAGNOSIS — M609 Myositis, unspecified: Secondary | ICD-10-CM

## 2015-10-27 MED ORDER — OSELTAMIVIR PHOSPHATE 75 MG PO CAPS: 75.0000 mg | ORAL_CAPSULE | Freq: Two times a day (BID) | ORAL | Status: DC

## 2015-10-27 NOTE — Progress Notes (Signed)
Pre visit review using our clinic review tool, if applicable. No additional management support is needed unless otherwise documented below in the visit note. 

## 2015-10-27 NOTE — Patient Instructions (Signed)
You have very early flu like syndrome.    I am treating you with tamiflu based on your clinical presentation. Rest, hydrate and take tylenol for fever. Alternate ibuprofen  if necessary for body ache or fever.    You should gradually improve but  if you develop secondary bacterial infection signs and symptoms  please notify us so we can recheck  or antibiotic can be prescribed.  Follow up in 7 days or as needed

## 2015-10-27 NOTE — Telephone Encounter (Signed)
Pt says that he was seen by Percell Miller and was told that he would have Tamiflu sent in to the pharmacy . Checked system and isn't showing a Rx. Please assist further.   [facility] - [phone].

## 2015-10-27 NOTE — Progress Notes (Signed)
Subjective:    Patient ID: Andrew Ramos, male    DOB: 1931-12-05, 82 y.o.   MRN: [id number]  HPI  Pt states onset chills, fatigue, st and body aches all over since yesterday. Mild st.  No known strep throat contact. No family members sick.     Review of Systems  Constitutional: Positive for chills and fatigue. Negative for fever and diaphoresis.  HENT: Positive for sore throat. Negative for congestion, ear pain, postnasal drip and sinus pressure.        Mild st.  Respiratory: Negative for cough, chest tightness, shortness of breath and wheezing.   Cardiovascular: Negative for chest pain and palpitations.  Gastrointestinal: Negative for nausea, vomiting, abdominal pain, diarrhea and constipation.  Musculoskeletal: Positive for myalgias. Negative for back pain.       Diffuse aches all over since last night.   Neurological: Negative for dizziness, syncope, weakness and headaches.  Hematological: Negative for adenopathy. Does not bruise/bleed easily.   Past Medical History  Diagnosis Date  . Hypertension   . Hyperlipidemia   . Colon polyps     Tubular Adenoma 2005  . Thyroid disease sees dr altzhimer for yearly    nodules  . Prostate cancer ([place]) 2007    s/p radiation seed implants  . Polymyalgia rheumatica ([place])   . Hepatitis     pt told by [facility] has hepatitis antibodies in blood   . Osteoarthritis     right hip- none since hip replacement  . Prostate cancer ([place])   . Melanoma ([place])   . Nonmelanoma skin cancer   . Polyposis of colon   . Diverticulosis     Social History   Social History  . Marital Status: Married    Spouse Name: N/A  . Number of Children: 2  . Years of Education: N/A   Occupational History  . retired Nature conservation officer    Social History Main Topics  . Smoking status: Never Smoker   . Smokeless tobacco: Never Used  . Alcohol Use: 0.0 oz/week    0 Standard drinks or equivalent per week     Comment: occasional  . Drug Use: No  . Sexual  Activity:    Partners: Female   Other Topics Concern  . Not on file   Social History Narrative   Exercise--no    Past Surgical History  Procedure Laterality Date  . Total knee arthroplasty  12/31/2008    left  . Total hip arthroplasty  08/27/2008    left  . Tonsillectomy  as child  . Total hip arthroplasty  06/12/2012    Procedure: TOTAL HIP ARTHROPLASTY ANTERIOR APPROACH;  Surgeon: Mauri Pole, MD;  Location: [facility];  Service: Orthopedics;  Laterality: Right;  . Colonoscopy    . Polypectomy      Family History  Problem Relation Age of Onset  . Coronary artery disease Mother 24  . Heart disease Mother 35    MI  . Coronary artery disease Father 63  . Pancreatic cancer Father 71    deceased 48  . Pancreatic cancer Brother 75    deceased 60; smoker  . Colon cancer Neg Hx   . Colon polyps Neg Hx   . Esophageal cancer Neg Hx   . Kidney disease Neg Hx   . Diabetes Neg Hx   . Gallbladder disease Neg Hx     No Known Allergies  Current Outpatient Prescriptions on File Prior to Visit  Medication Sig Dispense Refill  .  alendronate (FOSAMAX) 70 MG tablet Take 70 mg by mouth every 7 (seven) days. Take with a full glass of water on an empty stomach every Friday    . aspirin 81 MG tablet Take 1 tablet (81 mg total) by mouth daily. 30 tablet   . atorvastatin (LIPITOR) 40 MG tablet Take 1 tablet (40 mg total) by mouth daily. 90 tablet 1  . Calcium Carbonate-Vitamin D (CALCIUM 600 + D PO) Take 1 tablet by mouth every morning.     . Cholecalciferol (VITAMIN D3) 1000 UNITS tablet Take 1,000 Units by mouth every morning.     . docusate sodium 100 MG CAPS Take 100 mg by mouth 2 (two) times daily. 10 capsule   . glucose blood (FREESTYLE LITE) test strip Check blood sugar once daily. Dx 250.00 (Patient taking differently: Check blood sugar two times a week. Dx 250.00) 100 each 12  . Lancets (FREESTYLE) lancets   11  . lisinopril-hydrochlorothiazide (PRINZIDE,ZESTORETIC) 10-12.5 MG per  tablet Take 1 tablet by mouth daily. 90 tablet 3  . methotrexate (RHEUMATREX) 2.5 MG tablet Take 15 mg by mouth once a week.    . Multiple Vitamins-Minerals (CENTRUM SILVER ULTRA MENS) TABS Take 1 tablet by mouth every evening.     . polyethylene glycol powder (GLYCOLAX/MIRALAX) powder Take 1 Container by mouth daily. 1 scoop daily    . terazosin (HYTRIN) 1 MG capsule Take 1 capsule (1 mg total) by mouth at bedtime. 90 capsule 3  . venlafaxine XR (EFFEXOR XR) 75 MG 24 hr capsule Take 1 capsule (75 mg total) by mouth daily with breakfast. 90 capsule 3   No current facility-administered medications on file prior to visit.    BP 118/78 mmHg  Pulse 89  Temp(Src) 98.1 F (36.7 C) (Oral)  Ht 6' (1.829 m)  Wt 219 lb (99.338 kg)  BMI 29.70 kg/m2  SpO2 98%       Objective:   Physical Exam  General  Mental Status - Alert. General Appearance - Well groomed. Not in acute distress.(But describes diffuse achiness and when he gets up from sitting reports in is both legs. New since last night)  Skin Rashes- No Rashes.  HEENT Head- Normal. Ear Auditory Canal - Left- Normal. Right - Normal.Tympanic Membrane- Left- Normal. Right- Normal. Eye Sclera/Conjunctiva- Left- Normal. Right- Normal. Nose & Sinuses Nasal Mucosa- Left-  Mild  boggy and Congested. Right-  Mild   boggy and Congested. Mouth & Throat Lips: Upper Lip- Normal: no dryness, cracking, pallor, cyanosis, or vesicular eruption. Lower Lip-Normal: no dryness, cracking, pallor, cyanosis or vesicular eruption. Buccal Mucosa- Bilateral- No Aphthous ulcers. Oropharynx- No  Discharge but faint  Erythema. Tonsils: Characteristics- Bilateral- faint  Erythema and  Congestion. Size/Enlargement- Bilateral- No enlargement. Discharge- bilateral-None.  Neck Neck- Supple. No Masses. No lymphadenopathy.   Chest and Lung Exam Auscultation: Breath Sounds:- even and unlabored.  Cardiovascular Auscultation:Rythm- Regular, rate and  rhythm. Murmurs & Other Heart Sounds:Ausculatation of the heart reveal- No Murmurs.  Lymphatic Head & Neck General Head & Neck Lymphatics: Bilateral: Description- No Localized lymphadenopathy.       Assessment & Plan:  You have very early flu like syndrome.    I am treating you with tamiflu based on your clinical presentation. Rest, hydrate and take tylenol for fever. Alternate ibuprofen  if necessary for body ache or fever.    You should gradually improve but  if you develop secondary bacterial infection signs and symptoms  please notify us so we can recheck  or antibiotic can be prescribed.  Follow up in 7 days or as needed

## 2015-10-29 NOTE — Telephone Encounter (Signed)
Spoke with pt and he states that he picked up Rx and he states that he picked up and it was not ready until after 7 pm.

## 2015-11-03 ENCOUNTER — Ambulatory Visit (INDEPENDENT_AMBULATORY_CARE_PROVIDER_SITE_OTHER): Payer: Medicare Other | Admitting: Family Medicine

## 2015-11-03 ENCOUNTER — Encounter: Payer: Self-pay | Admitting: Family Medicine

## 2015-11-03 VITALS — BP 113/71 | HR 80 | Temp 97.9°F | Resp 20 | Wt 218.4 lb

## 2015-11-03 DIAGNOSIS — J0100 Acute maxillary sinusitis, unspecified: Secondary | ICD-10-CM

## 2015-11-03 MED ORDER — AMOXICILLIN-POT CLAVULANATE 875-125 MG PO TABS: 1.0000 | ORAL_TABLET | Freq: Two times a day (BID) | ORAL | Status: DC

## 2015-11-03 NOTE — Patient Instructions (Signed)
augmentin prescribed.  Pick up at the store flonase and  Mucinex (plain). These will help with the nasal drainage. rest, hydrate   Sinusitis, Adult Sinusitis is redness, soreness, and inflammation of the paranasal sinuses. Paranasal sinuses are air pockets within the bones of your face. They are located beneath your eyes, in the middle of your forehead, and above your eyes. In healthy paranasal sinuses, mucus is able to drain out, and air is able to circulate through them by way of your nose. However, when your paranasal sinuses are inflamed, mucus and air can become trapped. This can allow bacteria and other germs to grow and cause infection. Sinusitis can develop quickly and last only a short time (acute) or continue over a long period (chronic). Sinusitis that lasts for more than 12 weeks is considered chronic. CAUSES Causes of sinusitis include:  Allergies.  Structural abnormalities, such as displacement of the cartilage that separates your nostrils (deviated septum), which can decrease the air flow through your nose and sinuses and affect sinus drainage.  Functional abnormalities, such as when the small hairs (cilia) that line your sinuses and help remove mucus do not work properly or are not present. SIGNS AND SYMPTOMS Symptoms of acute and chronic sinusitis are the same. The primary symptoms are pain and pressure around the affected sinuses. Other symptoms include:  Upper toothache.  Earache.  Headache.  Bad breath.  Decreased sense of smell and taste.  A cough, which worsens when you are lying flat.  Fatigue.  Fever.  Thick drainage from your nose, which often is green and may contain pus (purulent).  Swelling and warmth over the affected sinuses. DIAGNOSIS Your health care provider will perform a physical exam. During your exam, your health care provider may perform any of the following to help determine if you have acute sinusitis or chronic sinusitis:  Look in your  nose for signs of abnormal growths in your nostrils (nasal polyps).  Tap over the affected sinus to check for signs of infection.  View the inside of your sinuses using an imaging device that has a light attached (endoscope). If your health care provider suspects that you have chronic sinusitis, one or more of the following tests may be recommended:  Allergy tests.  Nasal culture. A sample of mucus is taken from your nose, sent to a lab, and screened for bacteria.  Nasal cytology. A sample of mucus is taken from your nose and examined by your health care provider to determine if your sinusitis is related to an allergy. TREATMENT Most cases of acute sinusitis are related to a viral infection and will resolve on their own within 10 days. Sometimes, medicines are prescribed to help relieve symptoms of both acute and chronic sinusitis. These may include pain medicines, decongestants, nasal steroid sprays, or saline sprays. However, for sinusitis related to a bacterial infection, your health care provider will prescribe antibiotic medicines. These are medicines that will help kill the bacteria causing the infection. Rarely, sinusitis is caused by a fungal infection. In these cases, your health care provider will prescribe antifungal medicine. For some cases of chronic sinusitis, surgery is needed. Generally, these are cases in which sinusitis recurs more than 3 times per year, despite other treatments. HOME CARE INSTRUCTIONS  Drink plenty of water. Water helps thin the mucus so your sinuses can drain more easily.  Use a humidifier.  Inhale steam 3-4 times a day (for example, sit in the bathroom with the shower running).  Apply a warm,  moist washcloth to your face 3-4 times a day, or as directed by your health care provider.  Use saline nasal sprays to help moisten and clean your sinuses.  Take medicines only as directed by your health care provider.  If you were prescribed either an  antibiotic or antifungal medicine, finish it all even if you start to feel better. SEEK IMMEDIATE MEDICAL CARE IF:  You have increasing pain or severe headaches.  You have nausea, vomiting, or drowsiness.  You have swelling around your face.  You have vision problems.  You have a stiff neck.  You have difficulty breathing.   This information is not intended to replace advice given to you by your health care provider. Make sure you discuss any questions you have with your health care provider.   Document Released: 09/05/2005 Document Revised: 09/26/2014 Document Reviewed: 09/20/2011 Elsevier Interactive Patient Education [employer].

## 2015-11-03 NOTE — Progress Notes (Signed)
Patient ID: Andrew Ramos, male   DOB: 06-23-1932, 82 y.o.   MRN: [id number]    Andrew Ramos , Nov 19, 1931, 82 y.o., male MRN: [id number]   Subjective: Facial pressure: Patient was seen last week and treated with tamiflu. He feels much improved from the flu, but is still having rhinorhea, nasal congestion, mild sore throat and now increasing  facial pressure. No further fever, chills, headaches, nausea, vomit, diarrhea or shortness of breath.  UTD with flu and tdap On methotrexate. GFR 81 (09/2015)  No Known Allergies Social History  Substance Use Topics  . Smoking status: Never Smoker   . Smokeless tobacco: Never Used  . Alcohol Use: 0.0 oz/week    0 Standard drinks or equivalent per week     Comment: occasional   Past Medical History  Diagnosis Date  . Hypertension   . Hyperlipidemia   . Colon polyps     Tubular Adenoma 2005  . Thyroid disease sees dr altzhimer for yearly    nodules  . Prostate cancer ([place]) 2007    s/p radiation seed implants  . Polymyalgia rheumatica ([place])   . Hepatitis     pt told by [facility] has hepatitis antibodies in blood   . Osteoarthritis     right hip- none since hip replacement  . Prostate cancer ([place])   . Melanoma ([place])   . Nonmelanoma skin cancer   . Polyposis of colon   . Diverticulosis    Past Surgical History  Procedure Laterality Date  . Total knee arthroplasty  12/31/2008    left  . Total hip arthroplasty  08/27/2008    left  . Tonsillectomy  as child  . Total hip arthroplasty  06/12/2012    Procedure: TOTAL HIP ARTHROPLASTY ANTERIOR APPROACH;  Surgeon: Mauri Pole, MD;  Location: [facility];  Service: Orthopedics;  Laterality: Right;  . Colonoscopy    . Polypectomy     Family History  Problem Relation Age of Onset  . Coronary artery disease Mother 41  . Heart disease Mother 39    MI  . Coronary artery disease Father 54  . Pancreatic cancer Father 20    deceased 60  . Pancreatic cancer Brother 102    deceased 3; smoker    . Colon cancer Neg Hx   . Colon polyps Neg Hx   . Esophageal cancer Neg Hx   . Kidney disease Neg Hx   . Diabetes Neg Hx   . Gallbladder disease Neg Hx      Medication List       This list is accurate as of: 11/03/15  1:42 PM.  Always use your most recent med list.               alendronate 70 MG tablet  Commonly known as:  FOSAMAX  Take 70 mg by mouth every 7 (seven) days. Take with a full glass of water on an empty stomach every Friday     aspirin 81 MG tablet  Take 1 tablet (81 mg total) by mouth daily.     atorvastatin 40 MG tablet  Commonly known as:  LIPITOR  Take 1 tablet (40 mg total) by mouth daily.     CALCIUM 600 + D PO  Take 1 tablet by mouth every morning.     CENTRUM SILVER ULTRA MENS Tabs  Take 1 tablet by mouth every evening.     cholecalciferol 1000 units tablet  Commonly known as:  VITAMIN D  Take  1,000 Units by mouth every morning.     DSS 100 MG Caps  Take 100 mg by mouth 2 (two) times daily.     freestyle lancets     glucose blood test strip  Commonly known as:  FREESTYLE LITE  Check blood sugar once daily. Dx 250.00     lisinopril-hydrochlorothiazide 10-12.5 MG tablet  Commonly known as:  PRINZIDE,ZESTORETIC  Take 1 tablet by mouth daily.     methotrexate 2.5 MG tablet  Commonly known as:  RHEUMATREX  Take 15 mg by mouth once a week.     polyethylene glycol powder powder  Commonly known as:  GLYCOLAX/MIRALAX  Take 1 Container by mouth daily. 1 scoop daily     terazosin 1 MG capsule  Commonly known as:  HYTRIN  Take 1 capsule (1 mg total) by mouth at bedtime.     venlafaxine XR 75 MG 24 hr capsule  Commonly known as:  EFFEXOR XR  Take 1 capsule (75 mg total) by mouth daily with breakfast.        ROS: Negative, with the exception of above mentioned in HPI  Objective:  BP 113/71 mmHg  Pulse 80  Temp(Src) 97.9 F (36.6 C)  Resp 20  Wt 218 lb 6.4 oz (99.066 kg)  SpO2 98% Body mass index is 29.61 kg/(m^2). Gen:  Afebrile. No acute distress. Nontoxic in appearance.  HENT: AT. [place]. Bilateral TM visualized and normal in appearance. MMM, no oral lesions. Bilateral nares with erythema and swelling. Throat without erythema or exudates. Cobblestoning present. No cough. TTP right max sinus.  Eyes:Pupils Equal Round Reactive to light, Extraocular movements intact,  Conjunctiva without redness, discharge or icterus. Neck/lymp/endocrine: Supple, No lymphadenopathy CV: RRR  Chest: CTAB, no wheeze or crackles. Good air movement, normal resp effort.  Abd: Soft.  NTND. BS present  Neuro: Normal gait. PERLA. EOMi. Alert. Oriented x3  Assessment/Plan: Andrew Ramos is a 82 y.o. male present for acute OV for 1. Acute maxillary sinusitis, recurrence not specified - amoxicillin-clavulanate (AUGMENTIN) 875-125 MG tablet; Take 1 tablet by mouth 2 (two) times daily.  Dispense: 20 tablet; Refill: 0  - augmentin , flonase. Mucinex. Rest, hydrate - F/U PRN or if not improving in 1 week  Mujtaba Bollig, DO  [facility]

## 2015-11-19 DIAGNOSIS — Q828 Other specified congenital malformations of skin: Secondary | ICD-10-CM | POA: Diagnosis not present

## 2015-12-02 DIAGNOSIS — L308 Other specified dermatitis: Secondary | ICD-10-CM | POA: Diagnosis not present

## 2015-12-02 DIAGNOSIS — Z23 Encounter for immunization: Secondary | ICD-10-CM | POA: Diagnosis not present

## 2015-12-04 ENCOUNTER — Encounter: Payer: Self-pay | Admitting: Internal Medicine

## 2015-12-07 DIAGNOSIS — M0609 Rheumatoid arthritis without rheumatoid factor, multiple sites: Secondary | ICD-10-CM | POA: Diagnosis not present

## 2015-12-07 DIAGNOSIS — M353 Polymyalgia rheumatica: Secondary | ICD-10-CM | POA: Diagnosis not present

## 2015-12-07 DIAGNOSIS — M810 Age-related osteoporosis without current pathological fracture: Secondary | ICD-10-CM | POA: Diagnosis not present

## 2015-12-07 DIAGNOSIS — M150 Primary generalized (osteo)arthritis: Secondary | ICD-10-CM | POA: Diagnosis not present

## 2015-12-14 DIAGNOSIS — E042 Nontoxic multinodular goiter: Secondary | ICD-10-CM | POA: Diagnosis not present

## 2015-12-23 ENCOUNTER — Other Ambulatory Visit: Payer: Self-pay | Admitting: Family Medicine

## 2015-12-24 DIAGNOSIS — Z86018 Personal history of other benign neoplasm: Secondary | ICD-10-CM | POA: Diagnosis not present

## 2015-12-24 DIAGNOSIS — L820 Inflamed seborrheic keratosis: Secondary | ICD-10-CM | POA: Diagnosis not present

## 2015-12-24 DIAGNOSIS — D2271 Melanocytic nevi of right lower limb, including hip: Secondary | ICD-10-CM | POA: Diagnosis not present

## 2015-12-24 DIAGNOSIS — D225 Melanocytic nevi of trunk: Secondary | ICD-10-CM | POA: Diagnosis not present

## 2015-12-24 DIAGNOSIS — Z800 Family history of malignant neoplasm of digestive organs: Secondary | ICD-10-CM | POA: Diagnosis not present

## 2015-12-24 DIAGNOSIS — Z85828 Personal history of other malignant neoplasm of skin: Secondary | ICD-10-CM | POA: Diagnosis not present

## 2015-12-24 DIAGNOSIS — Z85820 Personal history of malignant melanoma of skin: Secondary | ICD-10-CM | POA: Diagnosis not present

## 2015-12-24 DIAGNOSIS — D2272 Melanocytic nevi of left lower limb, including hip: Secondary | ICD-10-CM | POA: Diagnosis not present

## 2015-12-27 ENCOUNTER — Other Ambulatory Visit: Payer: Self-pay | Admitting: Family Medicine

## 2015-12-31 ENCOUNTER — Other Ambulatory Visit: Payer: Self-pay | Admitting: Family Medicine

## 2016-01-04 DIAGNOSIS — H25013 Cortical age-related cataract, bilateral: Secondary | ICD-10-CM | POA: Diagnosis not present

## 2016-01-04 DIAGNOSIS — E113292 Type 2 diabetes mellitus with mild nonproliferative diabetic retinopathy without macular edema, left eye: Secondary | ICD-10-CM | POA: Diagnosis not present

## 2016-01-04 DIAGNOSIS — D2312 Other benign neoplasm of skin of left eyelid, including canthus: Secondary | ICD-10-CM | POA: Diagnosis not present

## 2016-01-04 DIAGNOSIS — H524 Presbyopia: Secondary | ICD-10-CM | POA: Diagnosis not present

## 2016-01-04 DIAGNOSIS — H2513 Age-related nuclear cataract, bilateral: Secondary | ICD-10-CM | POA: Diagnosis not present

## 2016-01-04 DIAGNOSIS — H02412 Mechanical ptosis of left eyelid: Secondary | ICD-10-CM | POA: Diagnosis not present

## 2016-01-04 DIAGNOSIS — E119 Type 2 diabetes mellitus without complications: Secondary | ICD-10-CM | POA: Diagnosis not present

## 2016-01-04 DIAGNOSIS — H40013 Open angle with borderline findings, low risk, bilateral: Secondary | ICD-10-CM | POA: Diagnosis not present

## 2016-01-04 DIAGNOSIS — H5203 Hypermetropia, bilateral: Secondary | ICD-10-CM | POA: Diagnosis not present

## 2016-01-04 DIAGNOSIS — H35363 Drusen (degenerative) of macula, bilateral: Secondary | ICD-10-CM | POA: Diagnosis not present

## 2016-01-04 DIAGNOSIS — H43813 Vitreous degeneration, bilateral: Secondary | ICD-10-CM | POA: Diagnosis not present

## 2016-01-04 DIAGNOSIS — H5703 Miosis: Secondary | ICD-10-CM | POA: Diagnosis not present

## 2016-02-04 ENCOUNTER — Ambulatory Visit (INDEPENDENT_AMBULATORY_CARE_PROVIDER_SITE_OTHER): Payer: Medicare Other | Admitting: Internal Medicine

## 2016-02-04 ENCOUNTER — Encounter: Payer: Self-pay | Admitting: Internal Medicine

## 2016-02-04 VITALS — BP 144/68 | HR 86 | Ht 72.0 in | Wt 217.0 lb

## 2016-02-04 DIAGNOSIS — Z86010 Personal history of colonic polyps: Secondary | ICD-10-CM

## 2016-02-04 DIAGNOSIS — K5900 Constipation, unspecified: Secondary | ICD-10-CM | POA: Diagnosis not present

## 2016-02-04 MED ORDER — NA SULFATE-K SULFATE-MG SULF 17.5-3.13-1.6 GM/177ML PO SOLN: 1.0000 | Freq: Once | ORAL | Status: DC

## 2016-02-04 NOTE — Progress Notes (Signed)
HISTORY OF PRESENT ILLNESS:  Andrew Ramos is a 82 y.o. male with past medical history as listed below who has been seen in this office for constipation and surveillance colonoscopy secondary to a history of multiple adenomatous colon polyps. Previous patient of Dr. Verl Blalock who established with me in early 2016. He presents today regarding ongoing problems with chronic constipation and the need for surveillance colonoscopy. The patient has had multiple prior colonoscopies. In December 2013 he had 9 polyps which were SSPs and tubular adenomatous. His most recent examination 11/24/2014 revealed 10 polyps which were mostly tubular adenomas and some sessile serrated adenomas. He also has melanosis coli and left-sided diverticulosis. He continues to complain of constipation for which she takes MiraLAX. Generally this is helpful. He denies abdominal pain. His overall health is excellent since his last examination except for a tripping accident with multiple minor injuries which have healed. Review of blood work from January 2017 shows normal comprehensive metabolic panel and hemoglobin A1c. No rectal bleeding. He is interested in surveillance  REVIEW OF SYSTEMS:  All non-GI ROS negative except for arthritis  Past Medical History  Diagnosis Date  . Hypertension   . Hyperlipidemia   . Colon polyps     Tubular Adenoma 2005  . Thyroid disease sees dr altzhimer for yearly    nodules  . Prostate cancer ([place]) 2007    s/p radiation seed implants  . Polymyalgia rheumatica ([place])   . Hepatitis     pt told by [facility] cross has hepatitis antibodies in blood   . Osteoarthritis     right hip- none since hip replacement  . Prostate cancer ([place])   . Melanoma ([place])   . Nonmelanoma skin cancer   . Polyposis of colon   . Diverticulosis     Past Surgical History  Procedure Laterality Date  . Total knee arthroplasty  12/31/2008    left  . Total hip arthroplasty  08/27/2008    left  . Tonsillectomy  as  child  . Total hip arthroplasty  06/12/2012    Procedure: TOTAL HIP ARTHROPLASTY ANTERIOR APPROACH;  Surgeon: Mauri Pole, MD;  Location: [facility];  Service: Orthopedics;  Laterality: Right;  . Colonoscopy    . Polypectomy      Social History Tanisha Edelman Plourde  reports that he has never smoked. He has never used smokeless tobacco. He reports that he drinks alcohol. He reports that he does not use illicit drugs.  family history includes Coronary artery disease (age of onset: 82) in his mother; Coronary artery disease (age of onset: 26) in his father; Heart disease (age of onset: 66) in his mother; Pancreatic cancer (age of onset: 79) in his brother; Pancreatic cancer (age of onset: 20) in his father. There is no history of Colon cancer, Colon polyps, Esophageal cancer, Kidney disease, Diabetes, or Gallbladder disease.  No Known Allergies     PHYSICAL EXAMINATION: Vital signs: BP 144/68 mmHg  Pulse 86  Ht 6' (1.829 m)  Wt 217 lb (98.431 kg)  BMI 29.42 kg/m2  Constitutional: Pleasant, generally well-appearing, no acute distress Psychiatric: alert and oriented x3, cooperative Eyes: extraocular movements intact, anicteric, conjunctiva pink Mouth: oral pharynx moist, no lesions Neck: supple no lymphadenopathy Cardiovascular: heart regular rate and rhythm, no murmur Lungs: clear to auscultation bilaterally Abdomen: soft, nontender, nondistended, no obvious ascites, no peritoneal signs, normal bowel sounds, no organomegaly Rectal: Deferred until upcoming colonoscopy Extremities: no clubbing, cyanosis, lower extremity edema bilaterally. There is trace edema  in the left ankle Skin: no lesions on visible extremities Neuro: No focal deficits. Cranial nerves intact  ASSESSMENT:  #1. Chronic constipation. Ongoing problem #2. History of multiple adenomatous colon polyps. Due for surveillance. Appropriate candidate, despite age, given his excellent health and motivation   PLAN:  #1.  Instructed to titrate MiraLAX to achieve desired result for his constipation #2. Schedule surveillance colonoscopy.The nature of the procedure, as well as the risks, benefits, and alternatives were carefully and thoroughly reviewed with the patient. Ample time for discussion and questions allowed. The patient understood, was satisfied, and agreed to proceed.

## 2016-02-04 NOTE — Patient Instructions (Signed)

## 2016-02-22 ENCOUNTER — Ambulatory Visit (AMBULATORY_SURGERY_CENTER): Payer: Medicare Other | Admitting: Internal Medicine

## 2016-02-22 ENCOUNTER — Encounter: Payer: Self-pay | Admitting: Internal Medicine

## 2016-02-22 VITALS — BP 143/76 | HR 51 | Temp 98.4°F | Resp 17 | Ht 72.0 in | Wt 217.0 lb

## 2016-02-22 DIAGNOSIS — Z86010 Personal history of colonic polyps: Secondary | ICD-10-CM | POA: Diagnosis not present

## 2016-02-22 DIAGNOSIS — D123 Benign neoplasm of transverse colon: Secondary | ICD-10-CM

## 2016-02-22 DIAGNOSIS — D120 Benign neoplasm of cecum: Secondary | ICD-10-CM

## 2016-02-22 DIAGNOSIS — N400 Enlarged prostate without lower urinary tract symptoms: Secondary | ICD-10-CM | POA: Diagnosis not present

## 2016-02-22 DIAGNOSIS — E119 Type 2 diabetes mellitus without complications: Secondary | ICD-10-CM | POA: Diagnosis not present

## 2016-02-22 DIAGNOSIS — F329 Major depressive disorder, single episode, unspecified: Secondary | ICD-10-CM | POA: Diagnosis not present

## 2016-02-22 MED ORDER — SODIUM CHLORIDE 0.9 % IV SOLN: 500.0000 mL | INTRAVENOUS | Status: DC

## 2016-02-22 NOTE — Op Note (Signed)
[place] Patient Name: Andrew Ramos Procedure Date: 02/22/2016 10:16 AM MRN: [id number] Endoscopist: Docia Chuck. Henrene Pastor , MD Age: 82 Referring MD:  Date of Birth: 04/22/1932 Gender: Male Procedure:                Colonoscopy, with cold snare polypectomy X3 Indications:              High risk colon cancer surveillance: Personal                            history of multiple (3 or more) adenomas. Multiple                            prior examinations with multiple adenomas and S                            S/P. 2 most recent examinations 2013 (9 polyps) and                            March 2016 (10 polyps) Medicines:                Monitored Anesthesia Care Procedure:                Pre-Anesthesia Assessment:                           - Prior to the procedure, a History and Physical                            was performed, and patient medications and                            allergies were reviewed. The patient's tolerance of                            previous anesthesia was also reviewed. The risks                            and benefits of the procedure and the sedation                            options and risks were discussed with the patient.                            All questions were answered, and informed consent                            was obtained. Prior Anticoagulants: The patient has                            taken no previous anticoagulant or antiplatelet                            agents. ASA Grade Assessment: II - A patient with  mild systemic disease. After reviewing the risks                            and benefits, the patient was deemed in                            satisfactory condition to undergo the procedure.                           After obtaining informed consent, the colonoscope                            was passed under direct vision. Throughout the                            procedure, the patient's blood  pressure, pulse, and                            oxygen saturations were monitored continuously. The                            Model CF-HQ190L [id number]) scope was introduced                            through the anus and advanced to the the cecum,                            identified by appendiceal orifice and ileocecal                            valve. The ileocecal valve, appendiceal orifice,                            and rectum were photographed. The quality of the                            bowel preparation was adequate to identify polyps.                            The colonoscopy was performed without difficulty.                            The patient tolerated the procedure well. The bowel                            preparation used was SUPREP. Scope In: 10:29:06 AM Scope Out: 10:48:53 AM Scope Withdrawal Time: 0 hours 16 minutes 26 seconds  Total Procedure Duration: 0 hours 19 minutes 47 seconds  Findings:                 Three polyps were found in the transverse colon and                            cecum. The polyps were 2 to 4 mm  in size. These                            polyps were removed with a cold snare. Resection                            and retrieval were complete.                           Multiple small and large-mouthed diverticula were                            found in the left colon.                           The exam was otherwise without abnormality on                            direct and retroflexion views. Complications:            No immediate complications. Estimated blood loss:                            None. Estimated Blood Loss:     Estimated blood loss: none. Impression:               - Three 2 to 4 mm polyps in the transverse colon                            and in the cecum, removed with a cold snare.                            Resected and retrieved.                           - Diverticulosis in the left colon.                           -  The examination was otherwise normal on direct                            and retroflexion views. Recommendation:           - Await pathology results.                           - No repeat colonoscopy due to age. Docia Chuck. Henrene Pastor, MD 02/22/2016 10:55:24 AM This report has been signed electronically. CC Letter to:             Rosalita Chessman, MD

## 2016-02-22 NOTE — Patient Instructions (Signed)
YOU HAD AN ENDOSCOPIC PROCEDURE TODAY AT [facility]:   Refer to the procedure report that was given to you for any specific questions about what was found during the examination.  If the procedure report does not answer your questions, please call your gastroenterologist to clarify.  If you requested that your care partner not be given the details of your procedure findings, then the procedure report has been included in a sealed envelope for you to review at your convenience later.  YOU SHOULD EXPECT: Some feelings of bloating in the abdomen. Passage of more gas than usual.  Walking can help get rid of the air that was put into your GI tract during the procedure and reduce the bloating. If you had a lower endoscopy (such as a colonoscopy or flexible sigmoidoscopy) you may notice spotting of blood in your stool or on the toilet paper. If you underwent a bowel prep for your procedure, you may not have a normal bowel movement for a few days.  Please Note:  You might notice some irritation and congestion in your nose or some drainage.  This is from the oxygen used during your procedure.  There is no need for concern and it should clear up in a day or so.  SYMPTOMS TO REPORT IMMEDIATELY:   Following lower endoscopy (colonoscopy or flexible sigmoidoscopy):  Excessive amounts of blood in the stool  Significant tenderness or worsening of abdominal pains  Swelling of the abdomen that is new, acute  Fever of 100F or higher   For urgent or emergent issues, a gastroenterologist can be reached at any hour by calling [phone].   DIET: Your first meal following the procedure should be a small meal and then it is ok to progress to your normal diet. Heavy or fried foods are harder to digest and may make you feel nauseous or bloated.  Likewise, meals heavy in dairy and vegetables can increase bloating.  Drink plenty of fluids but you should avoid alcoholic beverages for 24 hours.  Try to  increase the fiber in your diet, and drink plenty of fluid.  ACTIVITY:  You should plan to take it easy for the rest of today and you should NOT DRIVE or use heavy machinery until tomorrow (because of the sedation medicines used during the test).    FOLLOW UP: Our staff will call the number listed on your records the next business day following your procedure to check on you and address any questions or concerns that you may have regarding the information given to you following your procedure. If we do not reach you, we will leave a message.  However, if you are feeling well and you are not experiencing any problems, there is no need to return our call.  We will assume that you have returned to your regular daily activities without incident.  If any biopsies were taken you will be contacted by phone or by letter within the next 1-3 weeks.  Please call us at [phone] if you have not heard about the biopsies in 3 weeks.    SIGNATURES/CONFIDENTIALITY: You and/or your care partner have signed paperwork which will be entered into your electronic medical record.  These signatures attest to the fact that that the information above on your After Visit Summary has been reviewed and is understood.  Full responsibility of the confidentiality of this discharge information lies with you and/or your care-partner.   Read all of the handouts given to you by  room nurse.  Thank-you for choosing us for your healthcare needs today. 

## 2016-02-22 NOTE — Progress Notes (Signed)
Report to PACU, RN, vss, BBS= Clear.  

## 2016-02-22 NOTE — Progress Notes (Signed)
Called to room to assist during endoscopic procedure.  Patient ID and intended procedure confirmed with present staff. Received instructions for my participation in the procedure from the performing physician.  

## 2016-02-23 ENCOUNTER — Telehealth: Payer: Self-pay | Admitting: *Deleted

## 2016-02-23 NOTE — Telephone Encounter (Signed)
  Follow up Call-  Call back number 02/22/2016 11/24/2014  Post procedure Call Back phone  # [phone]  Permission to leave phone message Yes Yes     Patient questions:  Do you have a fever, pain , or abdominal swelling? No. Pain Score  0 *  Have you tolerated food without any problems? Yes.    Have you been able to return to your normal activities? Yes.    Do you have any questions about your discharge instructions: Diet   No. Medications  No. Follow up visit  No.  Do you have questions or concerns about your Care? No.  Actions: * If pain score is 4 or above: No action needed, pain <4.

## 2016-02-24 ENCOUNTER — Encounter: Payer: Medicare Other | Admitting: Internal Medicine

## 2016-02-29 ENCOUNTER — Encounter: Payer: Self-pay | Admitting: Internal Medicine

## 2016-03-08 DIAGNOSIS — M0609 Rheumatoid arthritis without rheumatoid factor, multiple sites: Secondary | ICD-10-CM | POA: Diagnosis not present

## 2016-03-08 DIAGNOSIS — M353 Polymyalgia rheumatica: Secondary | ICD-10-CM | POA: Diagnosis not present

## 2016-03-08 DIAGNOSIS — M150 Primary generalized (osteo)arthritis: Secondary | ICD-10-CM | POA: Diagnosis not present

## 2016-03-08 DIAGNOSIS — M810 Age-related osteoporosis without current pathological fracture: Secondary | ICD-10-CM | POA: Diagnosis not present

## 2016-03-23 DIAGNOSIS — H2511 Age-related nuclear cataract, right eye: Secondary | ICD-10-CM | POA: Diagnosis not present

## 2016-03-23 DIAGNOSIS — E119 Type 2 diabetes mellitus without complications: Secondary | ICD-10-CM | POA: Diagnosis not present

## 2016-03-23 DIAGNOSIS — H5703 Miosis: Secondary | ICD-10-CM | POA: Diagnosis not present

## 2016-03-23 DIAGNOSIS — H40013 Open angle with borderline findings, low risk, bilateral: Secondary | ICD-10-CM | POA: Diagnosis not present

## 2016-03-23 DIAGNOSIS — H25012 Cortical age-related cataract, left eye: Secondary | ICD-10-CM | POA: Diagnosis not present

## 2016-03-23 DIAGNOSIS — E113292 Type 2 diabetes mellitus with mild nonproliferative diabetic retinopathy without macular edema, left eye: Secondary | ICD-10-CM | POA: Diagnosis not present

## 2016-03-23 DIAGNOSIS — H25011 Cortical age-related cataract, right eye: Secondary | ICD-10-CM | POA: Diagnosis not present

## 2016-03-23 DIAGNOSIS — H2512 Age-related nuclear cataract, left eye: Secondary | ICD-10-CM | POA: Diagnosis not present

## 2016-03-23 DIAGNOSIS — H35363 Drusen (degenerative) of macula, bilateral: Secondary | ICD-10-CM | POA: Diagnosis not present

## 2016-03-23 DIAGNOSIS — H43813 Vitreous degeneration, bilateral: Secondary | ICD-10-CM | POA: Diagnosis not present

## 2016-03-23 DIAGNOSIS — D2312 Other benign neoplasm of skin of left eyelid, including canthus: Secondary | ICD-10-CM | POA: Diagnosis not present

## 2016-03-23 DIAGNOSIS — Z01818 Encounter for other preprocedural examination: Secondary | ICD-10-CM | POA: Diagnosis not present

## 2016-04-02 DIAGNOSIS — H2512 Age-related nuclear cataract, left eye: Secondary | ICD-10-CM | POA: Insufficient documentation

## 2016-04-04 DIAGNOSIS — H2512 Age-related nuclear cataract, left eye: Secondary | ICD-10-CM | POA: Diagnosis not present

## 2016-04-04 DIAGNOSIS — H25012 Cortical age-related cataract, left eye: Secondary | ICD-10-CM | POA: Diagnosis not present

## 2016-04-04 DIAGNOSIS — H5703 Miosis: Secondary | ICD-10-CM | POA: Diagnosis not present

## 2016-04-04 DIAGNOSIS — E7800 Pure hypercholesterolemia, unspecified: Secondary | ICD-10-CM | POA: Diagnosis not present

## 2016-04-04 DIAGNOSIS — H5709 Other anomalies of pupillary function: Secondary | ICD-10-CM | POA: Diagnosis not present

## 2016-04-04 DIAGNOSIS — H2511 Age-related nuclear cataract, right eye: Secondary | ICD-10-CM | POA: Diagnosis not present

## 2016-04-12 DIAGNOSIS — H2511 Age-related nuclear cataract, right eye: Secondary | ICD-10-CM | POA: Diagnosis not present

## 2016-04-12 DIAGNOSIS — H25011 Cortical age-related cataract, right eye: Secondary | ICD-10-CM | POA: Diagnosis not present

## 2016-04-18 ENCOUNTER — Ambulatory Visit (INDEPENDENT_AMBULATORY_CARE_PROVIDER_SITE_OTHER): Payer: Medicare Other | Admitting: Family Medicine

## 2016-04-18 ENCOUNTER — Encounter: Payer: Self-pay | Admitting: Family Medicine

## 2016-04-18 VITALS — BP 143/85 | HR 81 | Temp 97.7°F | Ht 77.0 in | Wt 212.4 lb

## 2016-04-18 DIAGNOSIS — E785 Hyperlipidemia, unspecified: Secondary | ICD-10-CM | POA: Diagnosis not present

## 2016-04-18 DIAGNOSIS — I10 Essential (primary) hypertension: Secondary | ICD-10-CM

## 2016-04-18 DIAGNOSIS — F329 Major depressive disorder, single episode, unspecified: Secondary | ICD-10-CM | POA: Diagnosis not present

## 2016-04-18 DIAGNOSIS — R739 Hyperglycemia, unspecified: Secondary | ICD-10-CM | POA: Diagnosis not present

## 2016-04-18 DIAGNOSIS — Z0000 Encounter for general adult medical examination without abnormal findings: Secondary | ICD-10-CM

## 2016-04-18 DIAGNOSIS — F32A Depression, unspecified: Secondary | ICD-10-CM

## 2016-04-18 LAB — LIPID PANEL
CHOL/HDL RATIO: 2
CHOLESTEROL: 108 mg/dL (ref 0–200)
HDL: 45.8 mg/dL (ref >39.00)
LDL CALC: 49 mg/dL (ref 0–99)
NonHDL: 62.32
Triglycerides: 68 mg/dL (ref 0.0–149.0)
VLDL: 13.6 mg/dL (ref 0.0–40.0)

## 2016-04-18 LAB — CBC WITH DIFFERENTIAL/PLATELET
BASOS PCT: 0.4 % (ref 0.0–3.0)
Basophils Absolute: 0 10*3/uL (ref 0.0–0.1)
EOS ABS: 0.1 10*3/uL (ref 0.0–0.7)
EOS PCT: 2.3 % (ref 0.0–5.0)
HEMATOCRIT: 40.3 % (ref 39.0–52.0)
HEMOGLOBIN: 13.6 g/dL (ref 13.0–17.0)
LYMPHS PCT: 34.8 % (ref 12.0–46.0)
Lymphs Abs: 1.1 10*3/uL (ref 0.7–4.0)
MCHC: 33.6 g/dL (ref 30.0–36.0)
MCV: 95.6 fl (ref 78.0–100.0)
MONOS PCT: 15.7 % — AB (ref 3.0–12.0)
Monocytes Absolute: 0.5 10*3/uL (ref 0.1–1.0)
NEUTROS ABS: 1.5 10*3/uL (ref 1.4–7.7)
Neutrophils Relative %: 46.8 % (ref 43.0–77.0)
Platelets: 170 10*3/uL (ref 150.0–400.0)
RBC: 4.21 Mil/uL — ABNORMAL LOW (ref 4.22–5.81)
RDW: 15.2 % (ref 11.5–15.5)
WBC: 3.3 10*3/uL — AB (ref 4.0–10.5)

## 2016-04-18 LAB — COMPREHENSIVE METABOLIC PANEL
ALBUMIN: 4.1 g/dL (ref 3.5–5.2)
ALT: 18 U/L (ref 0–53)
AST: 21 U/L (ref 0–37)
Alkaline Phosphatase: 89 U/L (ref 39–117)
BUN: 15 mg/dL (ref 6–23)
CALCIUM: 9.9 mg/dL (ref 8.4–10.5)
CHLORIDE: 102 meq/L (ref 96–112)
CO2: 29 meq/L (ref 19–32)
Creatinine, Ser: 0.94 mg/dL (ref 0.40–1.50)
GFR: 81.62 mL/min (ref >60.00)
Glucose, Bld: 100 mg/dL — ABNORMAL HIGH (ref 70–99)
POTASSIUM: 4.2 meq/L (ref 3.5–5.1)
SODIUM: 139 meq/L (ref 135–145)
Total Bilirubin: 0.9 mg/dL (ref 0.2–1.2)
Total Protein: 6.8 g/dL (ref 6.0–8.3)

## 2016-04-18 LAB — POCT URINALYSIS DIPSTICK
BILIRUBIN UA: NEGATIVE
GLUCOSE UA: NEGATIVE
Ketones, UA: NEGATIVE
Leukocytes, UA: NEGATIVE
Nitrite, UA: NEGATIVE
Protein, UA: NEGATIVE
RBC UA: NEGATIVE
Spec Grav, UA: 1.02
Urobilinogen, UA: 0.2
pH, UA: 6

## 2016-04-18 LAB — HEMOGLOBIN A1C: HEMOGLOBIN A1C: 5.7 % (ref 4.6–6.5)

## 2016-04-18 LAB — TSH: TSH: 1.31 u[IU]/mL (ref 0.35–4.50)

## 2016-04-18 MED ORDER — TERAZOSIN HCL 1 MG PO CAPS: 1.0000 mg | ORAL_CAPSULE | Freq: Every day | ORAL | 1 refills | Status: DC

## 2016-04-18 MED ORDER — LISINOPRIL-HYDROCHLOROTHIAZIDE 10-12.5 MG PO TABS: 1.0000 | ORAL_TABLET | Freq: Every day | ORAL | 1 refills | Status: DC

## 2016-04-18 MED ORDER — ROSUVASTATIN CALCIUM 20 MG PO TABS: 20.0000 mg | ORAL_TABLET | Freq: Every day | ORAL | Status: DC

## 2016-04-18 MED ORDER — VENLAFAXINE HCL ER 75 MG PO CP24: 75.0000 mg | ORAL_CAPSULE | Freq: Every day | ORAL | 3 refills | Status: DC

## 2016-04-18 NOTE — Progress Notes (Signed)
Subjective:   Andrew Ramos is a 82 y.o. male who presents for Medicare Annual/Subsequent preventive examination.  Review of Systems:   Review of Systems  Constitutional: Negative for activity change, appetite change and fatigue.  HENT: Negative for hearing loss, congestion, tinnitus and ear discharge.   Eyes: Negative for visual disturbance (see optho q1y -- vision corrected to 20/20 with glasses).  Respiratory: Negative for cough, chest tightness and shortness of breath.   Cardiovascular: Negative for chest pain, palpitations and leg swelling.  Gastrointestinal: Negative for abdominal pain, diarrhea, constipation and abdominal distention.  Genitourinary: Negative for urgency, frequency, decreased urine volume and difficulty urinating.  Musculoskeletal: Negative for back pain, arthralgias and gait problem.  Skin: Negative for color change, pallor and rash.  Neurological: Negative for dizziness, light-headedness, numbness and headaches.  Hematological: Negative for adenopathy. Does not bruise/bleed easily.  Psychiatric/Behavioral: Negative for suicidal ideas, confusion, sleep disturbance, self-injury, dysphoric mood, decreased concentration and agitation.  Pt is able to read and write and can do all ADLs No risk for falling No abuse/ violence in home  ]       Objective:    Vitals: BP (!) 143/85 (BP Location: Left Arm, Patient Position: Sitting)   Pulse 81   Temp 97.7 F (36.5 C) (Oral)   Ht 6\' 5"  (1.956 m)   Wt 212 lb 6.4 oz (96.3 kg)   SpO2 97%   BMI 25.19 kg/m   Body mass index is 25.19 kg/m. BP (!) 143/85 (BP Location: Left Arm, Patient Position: Sitting)   Pulse 81   Temp 97.7 F (36.5 C) (Oral)   Ht 6\' 5"  (1.956 m)   Wt 212 lb 6.4 oz (96.3 kg)   SpO2 97%   BMI 25.19 kg/m  General appearance: alert, cooperative, appears stated age and no distress Head: Normocephalic, without obvious abnormality, atraumatic Eyes: conjunctivae/corneas clear. PERRL, EOM's  intact. Fundi benign. Ears: normal TM's and external ear canals both ears Nose: Nares normal. Septum midline. Mucosa normal. No drainage or sinus tenderness. Throat: lips, mucosa, and tongue normal; teeth and gums normal Neck: no adenopathy, no carotid bruit, no JVD, supple, symmetrical, trachea midline and thyroid not enlarged, symmetric, no tenderness/mass/nodules Back: symmetric, no curvature. ROM normal. No CVA tenderness. Lungs: clear to auscultation bilaterally Chest wall: no tenderness Heart: regular rate and rhythm, S1, S2 normal, no murmur, click, rub or gallop Abdomen: soft, non-tender; bowel sounds normal; no masses,  no organomegaly Male genitalia: urology Rectal: deferred --urology Extremities: extremities normal, atraumatic, no cyanosis or edema Pulses: 2+ and symmetric Skin: Skin color, texture, turgor normal. No rashes or lesions Lymph nodes: Cervical, supraclavicular, and axillary nodes normal. Neurologic: Alert and oriented X 3, normal strength and tone. Normal symmetric reflexes. Normal coordination and gait  Sensory exam of the foot is normal, tested with the monofilament. Good pulses, no lesions or ulcers, good peripheral pulses.  Tobacco History  Smoking Status  . Never Smoker  Smokeless Tobacco  . Never Used     Counseling given: Not Answered   Past Medical History:  Diagnosis Date  . Colon polyps    Tubular Adenoma 2005  . Diverticulosis   . Hepatitis    pt told by [facility] has hepatitis antibodies in blood   . Hyperlipidemia   . Hypertension   . Melanoma ([place])   . Nonmelanoma skin cancer   . Osteoarthritis    right hip- none since hip replacement  . Polymyalgia rheumatica ([place])   . Polyposis of colon   .  Prostate cancer ([place]) 2007   s/p radiation seed implants  . Prostate cancer ([place])   . Thyroid disease sees dr altzhimer for yearly   nodules   Past Surgical History:  Procedure Laterality Date  . COLONOSCOPY    . POLYPECTOMY    .  TONSILLECTOMY  as child  . TOTAL HIP ARTHROPLASTY  08/27/2008   left  . TOTAL HIP ARTHROPLASTY  06/12/2012   Procedure: TOTAL HIP ARTHROPLASTY ANTERIOR APPROACH;  Surgeon: Mauri Pole, MD;  Location: [facility];  Service: Orthopedics;  Laterality: Right;  . TOTAL KNEE ARTHROPLASTY  12/31/2008   left   Family History  Problem Relation Age of Onset  . Coronary artery disease Mother 63  . Heart disease Mother 63    MI  . Coronary artery disease Father 63  . Pancreatic cancer Father 84    deceased 42  . Pancreatic cancer Brother 51    deceased 82; smoker  . Colon cancer Neg Hx   . Colon polyps Neg Hx   . Esophageal cancer Neg Hx   . Kidney disease Neg Hx   . Diabetes Neg Hx   . Gallbladder disease Neg Hx    History  Sexual Activity  . Sexual activity: Yes  . Partners: Female    Outpatient Encounter Prescriptions as of 04/18/2016  Medication Sig  . alendronate (FOSAMAX) 70 MG tablet Take 70 mg by mouth every 7 (seven) days. Take with a full glass of water on an empty stomach every Friday  . aspirin 81 MG tablet Take 1 tablet (81 mg total) by mouth daily.  . Calcium Carbonate-Vitamin D (CALCIUM 600 + D PO) Take 1 tablet by mouth every morning.   . Cholecalciferol (VITAMIN D3) 1000 UNITS tablet Take 1,000 Units by mouth every morning.   . folic acid (FOLVITE) 1 MG tablet Take 1 mg by mouth daily.  Marland Kitchen glucose blood (FREESTYLE LITE) test strip Check blood sugar once daily. Dx 250.00 (Patient taking differently: Check blood sugar two times a week. Dx 250.00)  . Lancets (FREESTYLE) lancets   . lisinopril-hydrochlorothiazide (PRINZIDE,ZESTORETIC) 10-12.5 MG tablet Take 1 tablet by mouth daily.  . methotrexate (RHEUMATREX) 2.5 MG tablet Take 15 mg by mouth once a week.  . Multiple Vitamins-Minerals (CENTRUM SILVER ULTRA MENS) TABS Take 1 tablet by mouth every evening.   . polyethylene glycol powder (GLYCOLAX/MIRALAX) powder Take 1 Container by mouth daily. 1 scoop daily  . rosuvastatin  (CRESTOR) 20 MG tablet Take 1 tablet (20 mg total) by mouth daily.  Marland Kitchen terazosin (HYTRIN) 1 MG capsule Take 1 capsule (1 mg total) by mouth at bedtime.  Marland Kitchen venlafaxine XR (EFFEXOR XR) 75 MG 24 hr capsule Take 1 capsule (75 mg total) by mouth daily with breakfast.  . [DISCONTINUED] lisinopril-hydrochlorothiazide (PRINZIDE,ZESTORETIC) 10-12.5 MG tablet TAKE 1 TABLET DAILY  . [DISCONTINUED] rosuvastatin (CRESTOR) 20 MG tablet Take 20 mg by mouth daily.  . [DISCONTINUED] terazosin (HYTRIN) 1 MG capsule TAKE 1 CAPSULE AT BEDTIME  . [DISCONTINUED] venlafaxine XR (EFFEXOR XR) 75 MG 24 hr capsule Take 1 capsule (75 mg total) by mouth daily with breakfast.   No facility-administered encounter medications on file as of 04/18/2016.     Activities of Daily Living In your present state of health, do you have any difficulty performing the following activities: 04/18/2016  Hearing? Y  Vision? N  Difficulty concentrating or making decisions? N  Walking or climbing stairs? N  Dressing or bathing? N  Doing errands, shopping? N  Some  recent data might be hidden    Patient Care Team: Ann Held, DO as PCP - General Hennie Duos, MD as Consulting Physician (Rheumatology) Jari Pigg, MD as Consulting Physician (Dermatology) Raynelle Bring, MD as Consulting Physician (Urology)   Assessment:    cpe Exercise Activities and Dietary recommendations Current Exercise Habits: Home exercise routine, Type of exercise: walking, Time (Minutes): 20, Frequency (Times/Week): 7, Weekly Exercise (Minutes/Week): 140, Intensity: Mild, Exercise limited by: None identified  Goals    None     Fall Risk Fall Risk  04/18/2016 10/12/2015 10/06/2014  Falls in the past year? Yes Yes No  Number falls in past yr: 1 1 -  Injury with Fall? - Yes -   Depression Screen PHQ 2/9 Scores 04/18/2016 04/18/2016 10/12/2015 10/06/2014  PHQ - 2 Score 0 0 0 0  PHQ- 9 Score 0 - - -    Cognitive Testing MMSE - Mini Mental State  Exam 04/18/2016  Orientation to time 5  Orientation to Place 5  Registration 3  Attention/ Calculation 5  Recall 3  Language- name 2 objects 2  Language- repeat 1  Language- follow 3 step command 3  Language- read & follow direction 1  Write a sentence 1  Copy design 1  Total score 30    Immunization History  Administered Date(s) Administered  . H1N1 10/21/2008  . Influenza Split 07/13/2011, 07/11/2012  . Influenza Whole 06/25/2008, 06/15/2009, 06/15/2011  . Influenza, High Dose Seasonal PF 08/14/2013  . Influenza,inj,Quad PF,36+ Mos 06/26/2014, 07/07/2015  . Pneumococcal Conjugate-13 10/06/2014  . Pneumococcal Polysaccharide-23 09/26/2001  . Td 10/02/2002  . Tdap 10/12/2015   Screening Tests Health Maintenance  Topic Date Due  . HEMOGLOBIN A1C  04/10/2016  . INFLUENZA VACCINE  04/19/2016  . COLONOSCOPY  02/21/2017  . OPHTHALMOLOGY EXAM  04/04/2017  . FOOT EXAM  04/18/2017  . TETANUS/TDAP  10/11/2025  . ZOSTAVAX  Addressed  . PNA vac Low Risk Adult  Completed      Plan:    During the course of the visit the patient was educated and counseled about the following appropriate screening and preventive services:   Vaccines to include Pneumoccal, Influenza, Hepatitis B, Td, Zostavax, HCV  Electrocardiogram  Cardiovascular Disease  Colorectal cancer screening  Diabetes screening  Prostate Cancer Screening  Glaucoma screening  Nutrition counseling   Smoking cessation counseling  Patient Instructions (the written plan) was given to the patient.   1. Depression stable - venlafaxine XR (EFFEXOR XR) 75 MG 24 hr capsule; Take 1 capsule (75 mg total) by mouth daily with breakfast.  Dispense: 90 capsule; Refill: 3  2. Essential hypertension Stable con't meds - terazosin (HYTRIN) 1 MG capsule; Take 1 capsule (1 mg total) by mouth at bedtime.  Dispense: 90 capsule; Refill: 1 - lisinopril-hydrochlorothiazide (PRINZIDE,ZESTORETIC) 10-12.5 MG tablet; Take 1 tablet  by mouth daily.  Dispense: 90 tablet; Refill: 1 - POCT urinalysis dipstick - CBC with Differential/Platelet - TSH - Lipid panel - Comprehensive metabolic panel - Hemoglobin A1c  3. Hyperlipidemia Check labs - rosuvastatin (CRESTOR) 20 MG tablet; Take 1 tablet (20 mg total) by mouth daily. - POCT urinalysis dipstick - CBC with Differential/Platelet - TSH - Lipid panel - Comprehensive metabolic panel - Hemoglobin A1c  4. Hyperglycemia Stable per pt Check labs - POCT urinalysis dipstick - CBC with Differential/Platelet - TSH - Lipid panel - Comprehensive metabolic panel - Hemoglobin A1c  5. Medicare annual wellness visit, subsequent See above  Ann Held,  DO  04/18/2016

## 2016-04-18 NOTE — Progress Notes (Signed)
Pre visit review using our clinic review tool, if applicable. No additional management support is needed unless otherwise documented below in the visit note. 

## 2016-04-18 NOTE — Patient Instructions (Signed)

## 2016-04-25 DIAGNOSIS — H25012 Cortical age-related cataract, left eye: Secondary | ICD-10-CM | POA: Diagnosis not present

## 2016-04-25 DIAGNOSIS — H2511 Age-related nuclear cataract, right eye: Secondary | ICD-10-CM | POA: Diagnosis not present

## 2016-04-25 DIAGNOSIS — H5709 Other anomalies of pupillary function: Secondary | ICD-10-CM | POA: Diagnosis not present

## 2016-04-25 DIAGNOSIS — H25011 Cortical age-related cataract, right eye: Secondary | ICD-10-CM | POA: Diagnosis not present

## 2016-04-25 DIAGNOSIS — H2512 Age-related nuclear cataract, left eye: Secondary | ICD-10-CM | POA: Diagnosis not present

## 2016-05-30 ENCOUNTER — Ambulatory Visit (INDEPENDENT_AMBULATORY_CARE_PROVIDER_SITE_OTHER): Payer: Medicare Other | Admitting: Behavioral Health

## 2016-05-30 DIAGNOSIS — Z23 Encounter for immunization: Secondary | ICD-10-CM | POA: Diagnosis not present

## 2016-05-30 NOTE — Progress Notes (Addendum)
Pre visit review using our clinic review tool, if applicable. No additional management support is needed unless otherwise documented below in the visit note.  Patient in clinic today for Influenza vaccine. IM given in Left Deltoid. Patient tolerated injection well.

## 2016-06-07 ENCOUNTER — Other Ambulatory Visit: Payer: Self-pay | Admitting: Family Medicine

## 2016-06-08 DIAGNOSIS — M150 Primary generalized (osteo)arthritis: Secondary | ICD-10-CM | POA: Diagnosis not present

## 2016-06-08 DIAGNOSIS — M0609 Rheumatoid arthritis without rheumatoid factor, multiple sites: Secondary | ICD-10-CM | POA: Diagnosis not present

## 2016-06-08 DIAGNOSIS — M353 Polymyalgia rheumatica: Secondary | ICD-10-CM | POA: Diagnosis not present

## 2016-06-08 DIAGNOSIS — M7989 Other specified soft tissue disorders: Secondary | ICD-10-CM | POA: Diagnosis not present

## 2016-06-08 DIAGNOSIS — Z79899 Other long term (current) drug therapy: Secondary | ICD-10-CM | POA: Diagnosis not present

## 2016-06-08 DIAGNOSIS — M810 Age-related osteoporosis without current pathological fracture: Secondary | ICD-10-CM | POA: Diagnosis not present

## 2016-06-21 ENCOUNTER — Other Ambulatory Visit: Payer: Self-pay | Admitting: Family Medicine

## 2016-06-23 DIAGNOSIS — H35363 Drusen (degenerative) of macula, bilateral: Secondary | ICD-10-CM | POA: Diagnosis not present

## 2016-06-23 DIAGNOSIS — E113292 Type 2 diabetes mellitus with mild nonproliferative diabetic retinopathy without macular edema, left eye: Secondary | ICD-10-CM | POA: Diagnosis not present

## 2016-06-23 DIAGNOSIS — H40012 Open angle with borderline findings, low risk, left eye: Secondary | ICD-10-CM | POA: Diagnosis not present

## 2016-06-23 DIAGNOSIS — H40021 Open angle with borderline findings, high risk, right eye: Secondary | ICD-10-CM | POA: Diagnosis not present

## 2016-06-23 DIAGNOSIS — D2312 Other benign neoplasm of skin of left eyelid, including canthus: Secondary | ICD-10-CM | POA: Diagnosis not present

## 2016-06-23 DIAGNOSIS — H5032 Intermittent alternating esotropia: Secondary | ICD-10-CM | POA: Diagnosis not present

## 2016-06-23 DIAGNOSIS — H43813 Vitreous degeneration, bilateral: Secondary | ICD-10-CM | POA: Diagnosis not present

## 2016-06-27 ENCOUNTER — Other Ambulatory Visit: Payer: Self-pay | Admitting: Family Medicine

## 2016-06-29 DIAGNOSIS — Z800 Family history of malignant neoplasm of digestive organs: Secondary | ICD-10-CM | POA: Diagnosis not present

## 2016-06-29 DIAGNOSIS — D2271 Melanocytic nevi of right lower limb, including hip: Secondary | ICD-10-CM | POA: Diagnosis not present

## 2016-06-29 DIAGNOSIS — Z85820 Personal history of malignant melanoma of skin: Secondary | ICD-10-CM | POA: Diagnosis not present

## 2016-06-29 DIAGNOSIS — D225 Melanocytic nevi of trunk: Secondary | ICD-10-CM | POA: Diagnosis not present

## 2016-06-29 DIAGNOSIS — Z23 Encounter for immunization: Secondary | ICD-10-CM | POA: Diagnosis not present

## 2016-06-29 DIAGNOSIS — D2272 Melanocytic nevi of left lower limb, including hip: Secondary | ICD-10-CM | POA: Diagnosis not present

## 2016-06-29 DIAGNOSIS — Z85828 Personal history of other malignant neoplasm of skin: Secondary | ICD-10-CM | POA: Diagnosis not present

## 2016-06-29 DIAGNOSIS — L821 Other seborrheic keratosis: Secondary | ICD-10-CM | POA: Diagnosis not present

## 2016-06-29 DIAGNOSIS — Z86018 Personal history of other benign neoplasm: Secondary | ICD-10-CM | POA: Diagnosis not present

## 2016-07-11 ENCOUNTER — Telehealth: Payer: Self-pay | Admitting: Family Medicine

## 2016-07-11 NOTE — Telephone Encounter (Signed)
Apt scheduled    KP 

## 2016-07-11 NOTE — Telephone Encounter (Signed)
Pt called in, he says that he is due a follow up with provider. Pt would like to come in on 10/30 or 10/31. Per provider schedule, not showing a F/U slot available to schedule on that day only showing 15 minute acutes.    Please advise for scheduling.

## 2016-07-11 NOTE — Telephone Encounter (Signed)
Put him in acute in am that Tuesday

## 2016-07-18 ENCOUNTER — Encounter: Payer: Self-pay | Admitting: Family Medicine

## 2016-07-18 ENCOUNTER — Ambulatory Visit (INDEPENDENT_AMBULATORY_CARE_PROVIDER_SITE_OTHER): Payer: Medicare Other | Admitting: Family Medicine

## 2016-07-18 VITALS — BP 142/80 | HR 63 | Temp 97.7°F | Resp 16 | Ht 79.0 in | Wt 214.2 lb

## 2016-07-18 DIAGNOSIS — R739 Hyperglycemia, unspecified: Secondary | ICD-10-CM | POA: Diagnosis not present

## 2016-07-18 DIAGNOSIS — E785 Hyperlipidemia, unspecified: Secondary | ICD-10-CM

## 2016-07-18 DIAGNOSIS — R413 Other amnesia: Secondary | ICD-10-CM

## 2016-07-18 DIAGNOSIS — I10 Essential (primary) hypertension: Secondary | ICD-10-CM | POA: Diagnosis not present

## 2016-07-18 LAB — CBC WITH DIFFERENTIAL/PLATELET
BASOS PCT: 0.3 % (ref 0.0–3.0)
Basophils Absolute: 0 10*3/uL (ref 0.0–0.1)
EOS ABS: 0.1 10*3/uL (ref 0.0–0.7)
Eosinophils Relative: 1.7 % (ref 0.0–5.0)
HCT: 42.3 % (ref 39.0–52.0)
HEMOGLOBIN: 14.2 g/dL (ref 13.0–17.0)
Lymphocytes Relative: 32.7 % (ref 12.0–46.0)
Lymphs Abs: 1.3 10*3/uL (ref 0.7–4.0)
MCHC: 33.5 g/dL (ref 30.0–36.0)
MCV: 98.3 fl (ref 78.0–100.0)
MONO ABS: 0.5 10*3/uL (ref 0.1–1.0)
Monocytes Relative: 13.5 % — ABNORMAL HIGH (ref 3.0–12.0)
Neutro Abs: 2 10*3/uL (ref 1.4–7.7)
Neutrophils Relative %: 51.8 % (ref 43.0–77.0)
Platelets: 159 10*3/uL (ref 150.0–400.0)
RBC: 4.3 Mil/uL (ref 4.22–5.81)
RDW: 14.6 % (ref 11.5–15.5)
WBC: 3.9 10*3/uL — AB (ref 4.0–10.5)

## 2016-07-18 LAB — COMPREHENSIVE METABOLIC PANEL
ALT: 21 U/L (ref 0–53)
AST: 20 U/L (ref 0–37)
Albumin: 4.2 g/dL (ref 3.5–5.2)
Alkaline Phosphatase: 81 U/L (ref 39–117)
BUN: 18 mg/dL (ref 6–23)
CHLORIDE: 104 meq/L (ref 96–112)
CO2: 30 meq/L (ref 19–32)
CREATININE: 0.92 mg/dL (ref 0.40–1.50)
Calcium: 10.2 mg/dL (ref 8.4–10.5)
GFR: 83.62 mL/min (ref >60.00)
GLUCOSE: 106 mg/dL — AB (ref 70–99)
Potassium: 4.2 mEq/L (ref 3.5–5.1)
SODIUM: 141 meq/L (ref 135–145)
Total Bilirubin: 0.5 mg/dL (ref 0.2–1.2)
Total Protein: 6.7 g/dL (ref 6.0–8.3)

## 2016-07-18 LAB — POCT URINALYSIS DIPSTICK
Bilirubin, UA: NEGATIVE
Blood, UA: NEGATIVE
Glucose, UA: NEGATIVE
Ketones, UA: NEGATIVE
Leukocytes, UA: NEGATIVE
Nitrite, UA: NEGATIVE
PH UA: 6
PROTEIN UA: NEGATIVE
UROBILINOGEN UA: 0.2

## 2016-07-18 LAB — LIPID PANEL
CHOL/HDL RATIO: 2
Cholesterol: 113 mg/dL (ref 0–200)
HDL: 51.6 mg/dL (ref >39.00)
LDL CALC: 52 mg/dL (ref 0–99)
NonHDL: 61.73
Triglycerides: 47 mg/dL (ref 0.0–149.0)
VLDL: 9.4 mg/dL (ref 0.0–40.0)

## 2016-07-18 LAB — HEMOGLOBIN A1C: HEMOGLOBIN A1C: 5.6 % (ref 4.6–6.5)

## 2016-07-18 LAB — VITAMIN B12: Vitamin B-12: 393 pg/mL (ref 211–911)

## 2016-07-18 LAB — TSH: TSH: 1.34 u[IU]/mL (ref 0.35–4.50)

## 2016-07-18 MED ORDER — DONEPEZIL HCL 5 MG PO TABS: 5.0000 mg | ORAL_TABLET | Freq: Every day | ORAL | 2 refills | Status: DC

## 2016-07-18 NOTE — Patient Instructions (Signed)

## 2016-07-18 NOTE — Progress Notes (Signed)
Patient ID: Andrew Ramos, male    DOB: [birth year]  Age: 82 y.o. MRN: [id number]    Subjective:  Subjective  HPI Andrew Ramos presents for memory problems----- he forgets to zip his pants , he will forget to get things he has offered his wife.  He brings the wrong utensils to eat with and he forgets simple things his wife asks for.   He is also here f/u bp, cholesterol and hyperglycemia.     Review of Systems  Constitutional: Negative for appetite change, diaphoresis, fatigue and unexpected weight change.  Eyes: Negative for pain, redness and visual disturbance.  Respiratory: Negative for cough, chest tightness, shortness of breath and wheezing.   Cardiovascular: Negative for chest pain, palpitations and leg swelling.  Endocrine: Negative for cold intolerance, heat intolerance, polydipsia, polyphagia and polyuria.  Genitourinary: Negative for difficulty urinating, dysuria and frequency.  Neurological: Negative for dizziness, light-headedness, numbness and headaches.  Psychiatric/Behavioral: Positive for confusion and decreased concentration.    History Past Medical History:  Diagnosis Date  . Colon polyps    Tubular Adenoma 2005  . Diverticulosis   . Hepatitis    pt told by [facility] has hepatitis antibodies in blood   . Hyperlipidemia   . Hypertension   . Melanoma ([place])   . Nonmelanoma skin cancer   . Osteoarthritis    right hip- none since hip replacement  . Polymyalgia rheumatica ([place])   . Polyposis of colon   . Prostate cancer ([place]) 2007   s/p radiation seed implants  . Prostate cancer ([place])   . Thyroid disease sees dr altzhimer for yearly   nodules    He has a past surgical history that includes Total knee arthroplasty (12/31/2008); Total hip arthroplasty (08/27/2008); Tonsillectomy (as child); Total hip arthroplasty (06/12/2012); Colonoscopy; Polypectomy; and Cataract extraction.   His family history includes Coronary artery disease (age of onset: 16) in his  mother; Coronary artery disease (age of onset: 21) in his father; Heart disease (age of onset: 43) in his mother; Pancreatic cancer (age of onset: 64) in his brother; Pancreatic cancer (age of onset: 92) in his father.He reports that he has never smoked. He has never used smokeless tobacco. He reports that he drinks alcohol. He reports that he does not use drugs.  Current Outpatient Prescriptions on File Prior to Visit  Medication Sig Dispense Refill  . alendronate (FOSAMAX) 70 MG tablet Take 70 mg by mouth every 7 (seven) days. Take with a full glass of water on an empty stomach every Friday    . aspirin 81 MG tablet Take 1 tablet (81 mg total) by mouth daily. 30 tablet   . atorvastatin (LIPITOR) 40 MG tablet TAKE 1 TABLET DAILY 90 tablet 1  . Calcium Carbonate-Vitamin D (CALCIUM 600 + D PO) Take 1 tablet by mouth every morning.     . Cholecalciferol (VITAMIN D3) 1000 UNITS tablet Take 1,000 Units by mouth every morning.     . folic acid (FOLVITE) 1 MG tablet Take 1 mg by mouth daily.    Marland Kitchen glucose blood (FREESTYLE LITE) test strip Check blood sugar once daily. Dx 250.00 (Patient taking differently: Check blood sugar two times a week. Dx 250.00) 100 each 12  . Lancets (FREESTYLE) lancets   11  . lisinopril-hydrochlorothiazide (PRINZIDE,ZESTORETIC) 10-12.5 MG tablet TAKE 1 TABLET DAILY 90 tablet 1  . methotrexate (RHEUMATREX) 2.5 MG tablet Take 12.5 mg by mouth once a week.     . Multiple Vitamins-Minerals (CENTRUM SILVER  ULTRA MENS) TABS Take 1 tablet by mouth every evening.     . polyethylene glycol powder (GLYCOLAX/MIRALAX) powder Take 1 Container by mouth daily. 1 scoop daily    . rosuvastatin (CRESTOR) 20 MG tablet Take 1 tablet (20 mg total) by mouth daily.    Marland Kitchen terazosin (HYTRIN) 1 MG capsule TAKE 1 CAPSULE AT BEDTIME 90 capsule 1  . venlafaxine XR (EFFEXOR XR) 75 MG 24 hr capsule Take 1 capsule (75 mg total) by mouth daily with breakfast. 90 capsule 3   No current facility-administered  medications on file prior to visit.      Objective:  Objective  Physical Exam  Constitutional: He is oriented to person, place, and time. Vital signs are normal. He appears well-developed and well-nourished. He is sleeping.  HENT:  Head: Normocephalic and atraumatic.  Mouth/Throat: Oropharynx is clear and moist.  Eyes: EOM are normal. Pupils are equal, round, and reactive to light.  Neck: Normal range of motion. Neck supple. No thyromegaly present.  Cardiovascular: Normal rate and regular rhythm.   No murmur heard. Pulmonary/Chest: Effort normal and breath sounds normal. No respiratory distress. He has no wheezes. He has no rales. He exhibits no tenderness.  Musculoskeletal: He exhibits no edema or tenderness.  Neurological: He is alert and oriented to person, place, and time.  mmse 30/30  Skin: Skin is warm and dry.  Psychiatric: His speech is normal. His affect is blunt. His affect is not labile and not inappropriate. He is slowed. Thought content is paranoid. Thought content is not delusional. Cognition and memory are impaired. He expresses no homicidal and no suicidal ideation. He expresses no suicidal plans and no homicidal plans. He exhibits abnormal recent memory. He exhibits normal remote memory.  Pt loses temper with wife frequently and pt wife is frustrated--- more fighting and she is tired of people telling her to be patient with Andrew Ramos Pt agrees there is something wrong with his memory but denies it being as bad as she says.  Mrs Folden states the care came back recenly with dents in it and he cant explain why.  Nursing note and vitals reviewed.  BP (!) 142/80 (BP Location: Right Arm, Patient Position: Sitting, Cuff Size: Large)   Pulse 63   Temp 97.7 F (36.5 C) (Oral)   Resp 16   Ht 6\' 7"  (2.007 m)   Wt 214 lb 3.2 oz (97.2 kg)   SpO2 96%   BMI 24.13 kg/m  Wt Readings from Last 3 Encounters:  07/18/16 214 lb 3.2 oz (97.2 kg)  04/18/16 212 lb 6.4 oz (96.3 kg)    02/22/16 217 lb (98.4 kg)     Lab Results  Component Value Date   WBC 3.3 (L) 04/18/2016   HGB 13.6 04/18/2016   HCT 40.3 04/18/2016   PLT 170.0 04/18/2016   GLUCOSE 100 (H) 04/18/2016   CHOL 108 04/18/2016   TRIG 68.0 04/18/2016   HDL 45.80 04/18/2016   LDLCALC 49 04/18/2016   ALT 18 04/18/2016   AST 21 04/18/2016   NA 139 04/18/2016   K 4.2 04/18/2016   CL 102 04/18/2016   CREATININE 0.94 04/18/2016   BUN 15 04/18/2016   CO2 29 04/18/2016   TSH 1.31 04/18/2016   PSA 0.08 (L) 10/06/2014   INR 0.95 06/11/2012   HGBA1C 5.7 04/18/2016   MICROALBUR <0.7 04/06/2015    Dg Hand Complete Left  Result Date: 10/11/2015 CLINICAL DATA:  Fall today with left hand pain. Pain and swelling  localized to the region of the fourth and fifth fingers. Initial encounter. EXAM: LEFT HAND - COMPLETE 3+ VIEW COMPARISON:  None. FINDINGS: No acute fracture or dislocation is identified. Mild osteoarthritis noted involving the digits throughout the left hand. No bony lesions, erosions or destruction identified. Soft tissues are unremarkable. IMPRESSION: No acute fracture identified. Electronically Signed   By: Aletta Edouard M.D.   On: 10/11/2015 15:13     Assessment & Plan:  Plan  I am having Andrew Ramos start on donepezil. I am also having him maintain his CENTRUM SILVER ULTRA MENS, cholecalciferol, alendronate, Calcium Carbonate-Vitamin D (CALCIUM 600 + D PO), methotrexate, glucose blood, freestyle, aspirin, polyethylene glycol powder, folic acid, venlafaxine XR, rosuvastatin, lisinopril-hydrochlorothiazide, terazosin, and atorvastatin.  Meds ordered this encounter  Medications  . donepezil (ARICEPT) 5 MG tablet    Sig: Take 1 tablet (5 mg total) by mouth at bedtime.    Dispense:  30 tablet    Refill:  2    Problem List Items Addressed This Visit    None    Visit Diagnoses    Memory loss    -  Primary   Relevant Medications   donepezil (ARICEPT) 5 MG tablet   Other Relevant Orders    Ambulatory referral to Neuropsychology   Comprehensive metabolic panel   Lipid panel   Hemoglobin A1c   CBC with Differential/Platelet   POCT urinalysis dipstick (Completed)   TSH   Vitamin B12   Hyperlipidemia LDL goal <70       Relevant Orders   Comprehensive metabolic panel   Lipid panel   Hyperglycemia       Relevant Orders   Comprehensive metabolic panel   Hemoglobin A1c   CBC with Differential/Platelet   POCT urinalysis dipstick (Completed)   TSH   Vitamin B12      Follow-up: No Follow-up on file.  Ann Held, DO

## 2016-07-18 NOTE — Progress Notes (Signed)
Pre visit review using our clinic review tool, if applicable. No additional management support is needed unless otherwise documented below in the visit note. 

## 2016-07-18 NOTE — Assessment & Plan Note (Signed)
con't lisinopril hct rto 3 months or sooner prn

## 2016-07-21 ENCOUNTER — Other Ambulatory Visit: Payer: Self-pay | Admitting: Internal Medicine

## 2016-07-21 DIAGNOSIS — M810 Age-related osteoporosis without current pathological fracture: Secondary | ICD-10-CM

## 2016-07-27 DIAGNOSIS — C61 Malignant neoplasm of prostate: Secondary | ICD-10-CM | POA: Diagnosis not present

## 2016-07-28 ENCOUNTER — Ambulatory Visit
Admission: RE | Admit: 2016-07-28 | Discharge: 2016-07-28 | Disposition: A | Discharge status: Home / Self Care | Payer: Medicare Other | Source: Ambulatory Visit | Attending: Internal Medicine | Admitting: Internal Medicine

## 2016-07-28 DIAGNOSIS — M810 Age-related osteoporosis without current pathological fracture: Secondary | ICD-10-CM

## 2016-07-28 DIAGNOSIS — Z13820 Encounter for screening for osteoporosis: Secondary | ICD-10-CM | POA: Diagnosis not present

## 2016-08-31 DIAGNOSIS — Z8546 Personal history of malignant neoplasm of prostate: Secondary | ICD-10-CM | POA: Diagnosis not present

## 2016-09-02 ENCOUNTER — Other Ambulatory Visit: Payer: Self-pay | Admitting: Family Medicine

## 2016-09-02 DIAGNOSIS — R413 Other amnesia: Secondary | ICD-10-CM

## 2016-09-08 DIAGNOSIS — M150 Primary generalized (osteo)arthritis: Secondary | ICD-10-CM | POA: Diagnosis not present

## 2016-09-08 DIAGNOSIS — M0609 Rheumatoid arthritis without rheumatoid factor, multiple sites: Secondary | ICD-10-CM | POA: Diagnosis not present

## 2016-09-08 DIAGNOSIS — Z6831 Body mass index (BMI) 31.0-31.9, adult: Secondary | ICD-10-CM | POA: Diagnosis not present

## 2016-09-08 DIAGNOSIS — Z79899 Other long term (current) drug therapy: Secondary | ICD-10-CM | POA: Diagnosis not present

## 2016-09-08 DIAGNOSIS — M810 Age-related osteoporosis without current pathological fracture: Secondary | ICD-10-CM | POA: Diagnosis not present

## 2016-09-08 DIAGNOSIS — M353 Polymyalgia rheumatica: Secondary | ICD-10-CM | POA: Diagnosis not present

## 2016-09-08 DIAGNOSIS — E669 Obesity, unspecified: Secondary | ICD-10-CM | POA: Diagnosis not present

## 2016-09-10 ENCOUNTER — Other Ambulatory Visit: Payer: Self-pay | Admitting: Family Medicine

## 2016-09-10 ENCOUNTER — Ambulatory Visit: Payer: Medicare Other | Admitting: Family Medicine

## 2016-09-10 ENCOUNTER — Encounter: Payer: Self-pay | Admitting: Family Medicine

## 2016-09-10 ENCOUNTER — Ambulatory Visit (INDEPENDENT_AMBULATORY_CARE_PROVIDER_SITE_OTHER): Payer: Medicare Other | Admitting: Family Medicine

## 2016-09-10 VITALS — BP 140/78 | HR 71 | Temp 98.5°F | Wt 228.0 lb

## 2016-09-10 DIAGNOSIS — J069 Acute upper respiratory infection, unspecified: Secondary | ICD-10-CM | POA: Diagnosis not present

## 2016-09-10 DIAGNOSIS — B9789 Other viral agents as the cause of diseases classified elsewhere: Secondary | ICD-10-CM | POA: Diagnosis not present

## 2016-09-10 MED ORDER — FLUTICASONE PROPIONATE 50 MCG/ACT NA SUSP: 2.0000 | Freq: Every day | NASAL | 0 refills | Status: DC

## 2016-09-10 NOTE — Patient Instructions (Signed)
Upper Respiratory infection History and exam today are suggestive of viral infection most likely due to upper respiratory infection. Symptomatic treatment with: flonase to reduce inflammation in nose and hopefully help with drainage. Can also use sore throat lozenges and salt water gargles for sore throat portoin  We discussed that we did not find any infection that had higher probability of being bacterial such as pneumonia or strep throat. We discussed signs that bacterial infection may have developed particularly fever or shortness of breath. Likely course of 1-2 weeks. Patient is contagious and advised good handwashing and consideration of mask If going to be in public places.   Finally, we reviewed reasons to return to care including if symptoms worsen or persist or new concerns arise- once again particularly shortness of breath or fever.  Meds ordered this encounter  Medications  . fluticasone (FLONASE) 50 MCG/ACT nasal spray    Sig: Place 2 sprays into both nostrils daily.    Dispense:  16 g    Refill:  0

## 2016-09-10 NOTE — Progress Notes (Signed)
PCP: Ann Held, DO  Subjective:  Andrew Ramos is a 82 y.o. year old very pleasant male patient who presents with Upper Respiratory infection symptoms including nasal congestion, sore throat, cough, fatigue. Has also had some vivid dreams of physical activity and being tired. Main symptom is watery drippy nose -started: about a week ago, symptoms are improving slightly -previous treatments: rest has been the best for him -sick contacts/travel/risks: denies flu exposure.   ROS-denies fever, SOB, NVD, tooth pain. Mild chills  Pertinent Past Medical History-  Patient Active Problem List   Diagnosis Date Noted  . Nuclear sclerotic cataract of left eye 04/02/2016  . Cortical age-related cataract of both eyes 01/04/2016  . Degenerative retinal drusen of both eyes 01/04/2016  . Nuclear sclerotic cataract of both eyes 01/04/2016  . Open angle with borderline findings and low glaucoma risk in both eyes 01/04/2016  . Posterior vitreous detachment of both eyes 01/04/2016  . Maxillary sinusitis, acute 11/03/2015  . Diabetes mellitus type II, controlled ([place]) 04/06/2015  . Acute upper respiratory infection 11/11/2014  . Prostate cancer ([place])   . Melanoma ([place])   . Nonmelanoma skin cancer   . Polyposis of colon   . Influenza-like illness 09/30/2013  . Obesity (BMI 30-39.9) 05/10/2013  . Obese 06/14/2012  . S/P right THA, AA 06/12/2012  . PMR (polymyalgia rheumatica) ([place]) 05/10/2011  . EXTERNAL HEMORRHOIDS 06/09/2010  . DIVERTICULOSIS, COLON 06/09/2010  . COLONIC POLYPS, HX OF 06/09/2010  . UNSPECIFIED VITAMIN D DEFICIENCY 05/18/2010  . TOTAL KNEE REPLACEMENT, LEFT, HX OF 01/22/2009  . Unspecified hearing loss 03/13/2008  . Thyroid nodule 08/14/2007  . PROSTATE CANCER, HX OF 06/18/2007  . Hyperlipidemia 02/28/2007  . Essential hypertension 02/28/2007  . OSTEOARTHRITIS 02/28/2007   Medications- reviewed  Current Outpatient Prescriptions  Medication Sig Dispense Refill  .  alendronate (FOSAMAX) 70 MG tablet Take 70 mg by mouth every 7 (seven) days. Take with a full glass of water on an empty stomach every Friday    . aspirin 81 MG tablet Take 1 tablet (81 mg total) by mouth daily. 30 tablet   . atorvastatin (LIPITOR) 40 MG tablet TAKE 1 TABLET DAILY 90 tablet 1  . Calcium Carbonate-Vitamin D (CALCIUM 600 + D PO) Take 1 tablet by mouth every morning.     . Cholecalciferol (VITAMIN D3) 1000 UNITS tablet Take 1,000 Units by mouth every morning.     . donepezil (ARICEPT) 5 MG tablet TAKE 1 TABLET(5 MG) BY MOUTH AT BEDTIME 90 tablet 2  . folic acid (FOLVITE) 1 MG tablet Take 1 mg by mouth daily.    Marland Kitchen glucose blood (FREESTYLE LITE) test strip Check blood sugar once daily. Dx 250.00 (Patient taking differently: Check blood sugar two times a week. Dx 250.00) 100 each 12  . Lancets (FREESTYLE) lancets   11  . lisinopril-hydrochlorothiazide (PRINZIDE,ZESTORETIC) 10-12.5 MG tablet TAKE 1 TABLET DAILY 90 tablet 1  . methotrexate (RHEUMATREX) 2.5 MG tablet Take 12.5 mg by mouth once a week.     . Multiple Vitamins-Minerals (CENTRUM SILVER ULTRA MENS) TABS Take 1 tablet by mouth every evening.     . polyethylene glycol powder (GLYCOLAX/MIRALAX) powder Take 1 Container by mouth daily. 1 scoop daily    . rosuvastatin (CRESTOR) 20 MG tablet Take 1 tablet (20 mg total) by mouth daily.    Marland Kitchen terazosin (HYTRIN) 1 MG capsule TAKE 1 CAPSULE AT BEDTIME 90 capsule 1  . venlafaxine XR (EFFEXOR XR) 75 MG 24 hr capsule  Take 1 capsule (75 mg total) by mouth daily with breakfast. 90 capsule 3   No current facility-administered medications for this visit.     Objective: BP 140/78   Pulse 71   Temp 98.5 F (36.9 C) (Oral)   Wt 228 lb (103.4 kg)   SpO2 98%   BMI 25.69 kg/m  Gen: NAD, resting comfortably HEENT: Turbinates erythematous, TM normal, pharynx mildly erythematous with no tonsilar exudate or edema, no sinus tenderness CV: RRR no murmurs rubs or gallops Lungs: CTAB no crackles,  wheeze, rhonchi Abdomen: soft/nontender/nondistended/normal bowel sounds. No rebound or guarding.  Ext: no edema Skin: warm, dry, no rash Neuro: grossly normal, moves all extremities  Assessment/Plan:  Upper Respiratory infection History and exam today are suggestive of viral infection most likely due to upper respiratory infection. Symptomatic treatment with: flonase to reduce inflammation in nose and hopefully help with drainage. Can also use sore throat lozenges and salt water gargles for sore throat portoin  We discussed that we did not find any infection that had higher probability of being bacterial such as pneumonia or strep throat. We discussed signs that bacterial infection may have developed particularly fever or shortness of breath. Likely course of 1-2 weeks. Patient is contagious and advised good handwashing and consideration of mask If going to be in public places.   Finally, we reviewed reasons to return to care including if symptoms worsen or persist or new concerns arise- once again particularly shortness of breath or fever.  Meds ordered this encounter  Medications  . fluticasone (FLONASE) 50 MCG/ACT nasal spray    Sig: Place 2 sprays into both nostrils daily.    Dispense:  16 g    Refill:  0    Garret Reddish, MD

## 2016-09-23 ENCOUNTER — Ambulatory Visit (INDEPENDENT_AMBULATORY_CARE_PROVIDER_SITE_OTHER): Payer: Medicare Other | Admitting: Neurology

## 2016-09-23 ENCOUNTER — Encounter: Payer: Self-pay | Admitting: Neurology

## 2016-09-23 ENCOUNTER — Other Ambulatory Visit: Payer: Self-pay | Admitting: Family Medicine

## 2016-09-23 VITALS — BP 142/82 | HR 111 | Ht 79.0 in | Wt 218.1 lb

## 2016-09-23 DIAGNOSIS — Z8546 Personal history of malignant neoplasm of prostate: Secondary | ICD-10-CM | POA: Diagnosis not present

## 2016-09-23 DIAGNOSIS — Z85820 Personal history of malignant melanoma of skin: Secondary | ICD-10-CM | POA: Diagnosis not present

## 2016-09-23 DIAGNOSIS — E784 Other hyperlipidemia: Secondary | ICD-10-CM | POA: Diagnosis not present

## 2016-09-23 DIAGNOSIS — G3184 Mild cognitive impairment, so stated: Secondary | ICD-10-CM | POA: Diagnosis not present

## 2016-09-23 DIAGNOSIS — F329 Major depressive disorder, single episode, unspecified: Secondary | ICD-10-CM

## 2016-09-23 DIAGNOSIS — E7849 Other hyperlipidemia: Secondary | ICD-10-CM

## 2016-09-23 DIAGNOSIS — I10 Essential (primary) hypertension: Secondary | ICD-10-CM | POA: Diagnosis not present

## 2016-09-23 DIAGNOSIS — F32A Depression, unspecified: Secondary | ICD-10-CM

## 2016-09-23 NOTE — Patient Instructions (Addendum)
1. Schedule MRI brain without contrast 2. Continue Donepezil 5mg  daily 3. Recommend driving evaluation:  [employer] in [place]. [phone] 4. Continue control of blood pressure, cholesterol, diabetes, as well as physical exercise and brain stimulation exercises (crossword puzzles, word search), for brain health 5. Follow-up in 6 months

## 2016-09-23 NOTE — Progress Notes (Signed)
NEUROLOGY CONSULTATION NOTE  Andrew Ramos MRN: [id number] DOB: [birth year]  Referring provider: Dr. Lyndal Ramos Primary care provider: Dr. Lyndal Ramos  Reason for consult:  Memory loss  Dear Dr Andrew Ramos:  Thank you for your kind referral of Andrew Ramos for consultation of the above symptoms. Although his history is well known to you, please allow me to reiterate it for the purpose of our medical record. The patient was accompanied to the clinic by his son who also provides collateral information. Records and images were personally reviewed where available.  HISTORY OF PRESENT ILLNESS: This is a pleasant 83 year old right-handed man with a history of hypertension, hyperlipidemia, prostate cancer, melanoma, presenting for evaluation of worsening memory and personality changes. Andrew Ramos himself thinks that he is doing okay, but "people keep telling me I have too many things to think about." He only mostly notices memory problems when he is in a hurry. He states he occasionally forgets conversations but feels this is due to his poor hearing. He denies getting lost driving, no missed medications. He has missed some bill payments a couple of years ago, none recently. His son states he is not nearly as sharp as he once was, he misplaces things frequently and asks the same questions repeatedly. He relates what the patient's wife has told him, that he is more argumentative. He has left the stove burner on at least twice in the past couple of weeks. He has walked away from the sink running. They have noticed more dents on the car, which the patient is unsure how they happened. They both wonder if these were from parking lot incidents because he states he has never hit another car. No difficulties with dressing/bathing independently, hygiene is good. On review of PCP notes where wife was present, she reported he forgets to zip his pants, he forgets things he has offered his wife. He brings the  wrong utensils to eat with. He forgets simple things his wife asks for. He states that she give him too many instructions. He loses temper with his wife frequently, they are fighting more and she is tired of people telling her to be more patient with him. He was noted to have a blunt affect with paranoid thought content on his PCP visit. MMSE in 06/2016 was 30/30. He was started on Donepezil 5mg  daily, which he is tolerating without side effects.   He denies any headaches, dizziness, diplopia, dysarthria, dysphagia, neck/back pain, focal numbness/tingling/weakness, bladder dysfunction. No anosmia, tremors, no falls. He has occasional constipation. His brother who is 7 years younger than him was diagnosed with dementia after he got lost driving and could not be found for 2 weeks. He denies any significant head injuries, he drinks alcohol very occasionally.   Laboratory Data: Lab Results  Component Value Date   WBC 3.9 (L) 07/18/2016   HGB 14.2 07/18/2016   HCT 42.3 07/18/2016   MCV 98.3 07/18/2016   PLT 159.0 07/18/2016     Chemistry      Component Value Date/Time   NA 141 07/18/2016 1120   K 4.2 07/18/2016 1120   CL 104 07/18/2016 1120   CO2 30 07/18/2016 1120   BUN 18 07/18/2016 1120   CREATININE 0.92 07/18/2016 1120   CREATININE 1.28 05/10/2013 1601      Component Value Date/Time   CALCIUM 10.2 07/18/2016 1120   ALKPHOS 81 07/18/2016 1120   AST 20 07/18/2016 1120   ALT 21 07/18/2016 1120  BILITOT 0.5 07/18/2016 1120     Lab Results  Component Value Date   TSH 1.34 07/18/2016   Lab Results  Component Value Date   [id number] 07/18/2016     PAST MEDICAL HISTORY: Past Medical History:  Diagnosis Date  . Colon polyps    Tubular Adenoma 2005  . Diverticulosis   . Hepatitis    pt told by [facility] has hepatitis antibodies in blood   . Hyperlipidemia   . Hypertension   . Melanoma (Andrew Ramos)   . Nonmelanoma skin cancer   . Osteoarthritis    right hip- none since hip  replacement  . Polymyalgia rheumatica (Andrew Ramos)   . Polyposis of colon   . Prostate cancer (Andrew Ramos) 2007   s/p radiation seed implants  . Prostate cancer (Andrew Ramos)   . Thyroid disease sees dr altzhimer for yearly   nodules    PAST SURGICAL HISTORY: Past Surgical History:  Procedure Laterality Date  . CATARACT EXTRACTION    . COLONOSCOPY    . POLYPECTOMY    . TONSILLECTOMY  as child  . TOTAL HIP ARTHROPLASTY  08/27/2008   left  . TOTAL HIP ARTHROPLASTY  06/12/2012   Procedure: TOTAL HIP ARTHROPLASTY ANTERIOR APPROACH;  Surgeon: Andrew Pole, MD;  Location: [facility];  Service: Orthopedics;  Laterality: Right;  . TOTAL KNEE ARTHROPLASTY  12/31/2008   left    MEDICATIONS: Current Outpatient Prescriptions on File Prior to Visit  Medication Sig Dispense Refill  . alendronate (FOSAMAX) 70 MG tablet Take 70 mg by mouth every 7 (seven) days. Take with a full glass of water on an empty stomach every Friday    . aspirin 81 MG tablet Take 1 tablet (81 mg total) by mouth daily. 30 tablet   . atorvastatin (LIPITOR) 40 MG tablet TAKE 1 TABLET DAILY 90 tablet 1  . Calcium Carbonate-Vitamin D (CALCIUM 600 + D PO) Take 1 tablet by mouth every morning.     . Cholecalciferol (VITAMIN D3) 1000 UNITS tablet Take 1,000 Units by mouth every morning.     . donepezil (Andrew Ramos) 5 MG tablet TAKE 1 TABLET(5 MG) BY MOUTH AT BEDTIME 90 tablet 2  . fluticasone (FLONASE) 50 MCG/ACT nasal spray USE 2 SPRAYS IN EACH NOSTRIL DAILY 16 g 1  . folic acid (FOLVITE) 1 MG tablet Take 1 mg by mouth daily.    Marland Kitchen glucose blood (FREESTYLE LITE) test strip Check blood sugar once daily. Dx 250.00 (Patient taking differently: Check blood sugar two times a week. Dx 250.00) 100 each 12  . Lancets (FREESTYLE) lancets   11  . lisinopril-hydrochlorothiazide (PRINZIDE,ZESTORETIC) 10-12.5 MG tablet TAKE 1 TABLET DAILY 90 tablet 1  . methotrexate (RHEUMATREX) 2.5 MG tablet Take 12.5 mg by mouth once a week.     . Multiple Vitamins-Minerals  (CENTRUM SILVER ULTRA MENS) TABS Take 1 tablet by mouth every evening.     . polyethylene glycol powder (GLYCOLAX/MIRALAX) powder Take 1 Container by mouth daily. 1 scoop daily    . rosuvastatin (CRESTOR) 20 MG tablet Take 1 tablet (20 mg total) by mouth daily.    Marland Kitchen terazosin (HYTRIN) 1 MG capsule TAKE 1 CAPSULE AT BEDTIME 90 capsule 1  . venlafaxine XR (EFFEXOR XR) 75 MG 24 hr capsule Take 1 capsule (75 mg total) by mouth daily with breakfast. 90 capsule 3   No current facility-administered medications on file prior to visit.     ALLERGIES: No Known Allergies  FAMILY HISTORY: Family History  Problem Relation Age  of Onset  . Coronary artery disease Mother 36  . Heart disease Mother 39    MI  . Coronary artery disease Father 72  . Pancreatic cancer Father 20    deceased 46  . Pancreatic cancer Brother 34    deceased 1; smoker  . Colon cancer Neg Hx   . Colon polyps Neg Hx   . Esophageal cancer Neg Hx   . Kidney disease Neg Hx   . Diabetes Neg Hx   . Gallbladder disease Neg Hx     SOCIAL HISTORY: Social History   Social History  . Marital status: Married    Spouse name: N/A  . Number of children: 2  . Years of education: N/A   Occupational History  . retired Nature conservation officer Retired   Social History Main Topics  . Smoking status: Never Smoker  . Smokeless tobacco: Never Used  . Alcohol use 0.0 oz/week     Comment: occasional  . Drug use: No  . Sexual activity: Yes    Partners: Female   Other Topics Concern  . Not on file   Social History Narrative   Exercise--no    REVIEW OF SYSTEMS: Constitutional: No fevers, chills, or sweats, no generalized fatigue, change in appetite Eyes: No visual changes, double vision, eye pain Ear, nose and throat: No hearing loss, ear pain, nasal congestion, sore throat Cardiovascular: No chest pain, palpitations Respiratory:  No shortness of breath at rest or with exertion, wheezes GastrointestinaI: No nausea, vomiting, diarrhea,  abdominal pain, fecal incontinence Genitourinary:  No dysuria, urinary retention or frequency Musculoskeletal:  No neck pain, back pain Integumentary: No rash, pruritus, skin lesions Neurological: as above Psychiatric: No depression, insomnia, anxiety Endocrine: No palpitations, fatigue, diaphoresis, mood swings, change in appetite, change in weight, increased thirst Hematologic/Lymphatic:  No anemia, purpura, petechiae. Allergic/Immunologic: no itchy/runny eyes, nasal congestion, recent allergic reactions, rashes  PHYSICAL EXAM: Vitals:   09/23/16 1349  BP: (!) 142/82  Pulse: (!) 111   General: No acute distress Head:  Normocephalic/atraumatic Eyes: Fundoscopic exam shows bilateral sharp discs, no vessel changes, exudates, or hemorrhages Neck: supple, no paraspinal tenderness, full range of motion Back: No paraspinal tenderness Heart: regular rate and rhythm Lungs: Clear to auscultation bilaterally. Vascular: No carotid bruits. Skin/Extremities: No rash, no edema Neurological Exam: Mental status: alert and oriented to person, place, and time, no dysarthria or aphasia, Fund of knowledge is appropriate.  Recent and remote memory are intact.  Attention and concentration are normal.    Able to name objects and repeat phrases.  Montreal Cognitive Assessment  09/23/2016  Visuospatial/ Executive (0/5) 5  Naming (0/3) 3  Attention: Read list of digits (0/2) 2  Attention: Read list of letters (0/1) 1  Attention: Serial 7 subtraction starting at 100 (0/3) 3  Language: Repeat phrase (0/2) 2  Language : Fluency (0/1) 0  Abstraction (0/2) 2  Delayed Recall (0/5) 2  Orientation (0/6) 6  Total 26   Cranial nerves: CN I: not tested CN II: pupils equal, round and reactive to light, visual fields intact, fundi unremarkable. CN III, IV, VI:  full range of motion, no nystagmus, no ptosis CN V: decreased cold on right V2, intact to light touch and pin CN VII: upper and lower face symmetric CN  VIII: hearing intact to finger rub CN IX, X: gag intact, uvula midline CN XI: sternocleidomastoid and trapezius muscles intact CN XII: tongue midline Bulk & Tone: normal, no fasciculations. Motor: 5/5 throughout with no pronator drift. Sensation:  decreased cold on right outer calf, otherwise intact to light touch, pin, vibration and joint position sense.  No extinction to double simultaneous stimulation.  Romberg test slight sway Deep Tendon Reflexes: +2 on both UE, +1 bilateral patella, unable to elicit ankle jerks bilaterally, no ankle clonus Plantar responses: downgoing bilaterally Cerebellar: no incoordination on finger to nose testing Gait: narrow-based and steady, able to tandem walk adequately. Tremor: none  IMPRESSION: This is a pleasant 83 year old right-handed man with hypertension, hyperlipidemia, prostate cancer, melanoma, presenting for evaluation of worsening memory and increased irritability at home. His neurological exam shows subjective decreased sensation on the right cheek and right outer calf. MOCA score 26/30, indicating mild cognitive impairment. MRI brain without contrast will be ordered to assess for underlying structural abnormality and assess vascular load. TSH and B12 normal. He has been started on Donepezil 5mg  daily, continue current medication. We discussed irritability, continue to monitor, he may benefit from increasing Effexor dose if symptoms continue. We discussed concerns about the car dents of unknown cause, recommend he have a driving evaluation. We discussed the importance of control of vascular risk factors, physical exercise, and brain stimulation exercises for brain health. He will follow-up in 6 months and knows to call for any changes. .  Thank you for allowing me to participate in the care of this patient. Please do not hesitate to call for any questions or concerns.   Ellouise Newer, M.D.  CC: Dr. Cheri Ramos

## 2016-09-29 ENCOUNTER — Emergency Department (HOSPITAL_COMMUNITY): Payer: Medicare Other

## 2016-09-29 ENCOUNTER — Encounter (HOSPITAL_COMMUNITY): Payer: Self-pay | Admitting: Emergency Medicine

## 2016-09-29 ENCOUNTER — Inpatient Hospital Stay (HOSPITAL_COMMUNITY)
Admission: EM | Admit: 2016-09-29 | Discharge: 2016-10-01 | DRG: 559 | Disposition: A | Discharge status: Home Health | Payer: Medicare Other | Attending: Internal Medicine | Admitting: Internal Medicine

## 2016-09-29 DIAGNOSIS — F32A Depression, unspecified: Secondary | ICD-10-CM | POA: Insufficient documentation

## 2016-09-29 DIAGNOSIS — R259 Unspecified abnormal involuntary movements: Secondary | ICD-10-CM | POA: Diagnosis not present

## 2016-09-29 DIAGNOSIS — S299XXA Unspecified injury of thorax, initial encounter: Secondary | ICD-10-CM | POA: Diagnosis not present

## 2016-09-29 DIAGNOSIS — M25559 Pain in unspecified hip: Secondary | ICD-10-CM | POA: Diagnosis not present

## 2016-09-29 DIAGNOSIS — W06XXXA Fall from bed, initial encounter: Secondary | ICD-10-CM | POA: Diagnosis not present

## 2016-09-29 DIAGNOSIS — F329 Major depressive disorder, single episode, unspecified: Secondary | ICD-10-CM | POA: Diagnosis not present

## 2016-09-29 DIAGNOSIS — Z923 Personal history of irradiation: Secondary | ICD-10-CM

## 2016-09-29 DIAGNOSIS — E785 Hyperlipidemia, unspecified: Secondary | ICD-10-CM | POA: Diagnosis not present

## 2016-09-29 DIAGNOSIS — S8992XA Unspecified injury of left lower leg, initial encounter: Secondary | ICD-10-CM | POA: Diagnosis not present

## 2016-09-29 DIAGNOSIS — Y92013 Bedroom of single-family (private) house as the place of occurrence of the external cause: Secondary | ICD-10-CM | POA: Diagnosis not present

## 2016-09-29 DIAGNOSIS — M069 Rheumatoid arthritis, unspecified: Secondary | ICD-10-CM | POA: Diagnosis present

## 2016-09-29 DIAGNOSIS — F0390 Unspecified dementia without behavioral disturbance: Secondary | ICD-10-CM | POA: Diagnosis present

## 2016-09-29 DIAGNOSIS — Z8546 Personal history of malignant neoplasm of prostate: Secondary | ICD-10-CM | POA: Diagnosis not present

## 2016-09-29 DIAGNOSIS — S72112A Displaced fracture of greater trochanter of left femur, initial encounter for closed fracture: Secondary | ICD-10-CM | POA: Diagnosis not present

## 2016-09-29 DIAGNOSIS — S72002A Fracture of unspecified part of neck of left femur, initial encounter for closed fracture: Secondary | ICD-10-CM | POA: Diagnosis present

## 2016-09-29 DIAGNOSIS — M353 Polymyalgia rheumatica: Secondary | ICD-10-CM | POA: Diagnosis not present

## 2016-09-29 DIAGNOSIS — M9702XA Periprosthetic fracture around internal prosthetic left hip joint, initial encounter: Secondary | ICD-10-CM | POA: Diagnosis not present

## 2016-09-29 DIAGNOSIS — N400 Enlarged prostate without lower urinary tract symptoms: Secondary | ICD-10-CM | POA: Insufficient documentation

## 2016-09-29 DIAGNOSIS — M79652 Pain in left thigh: Secondary | ICD-10-CM | POA: Diagnosis not present

## 2016-09-29 DIAGNOSIS — E1136 Type 2 diabetes mellitus with diabetic cataract: Secondary | ICD-10-CM | POA: Diagnosis present

## 2016-09-29 DIAGNOSIS — Z7982 Long term (current) use of aspirin: Secondary | ICD-10-CM

## 2016-09-29 DIAGNOSIS — S0990XA Unspecified injury of head, initial encounter: Secondary | ICD-10-CM | POA: Diagnosis not present

## 2016-09-29 DIAGNOSIS — H40013 Open angle with borderline findings, low risk, bilateral: Secondary | ICD-10-CM | POA: Diagnosis present

## 2016-09-29 DIAGNOSIS — W010XXA Fall on same level from slipping, tripping and stumbling without subsequent striking against object, initial encounter: Secondary | ICD-10-CM | POA: Diagnosis not present

## 2016-09-29 DIAGNOSIS — Z96652 Presence of left artificial knee joint: Secondary | ICD-10-CM | POA: Diagnosis present

## 2016-09-29 DIAGNOSIS — Z96643 Presence of artificial hip joint, bilateral: Secondary | ICD-10-CM | POA: Diagnosis present

## 2016-09-29 DIAGNOSIS — I10 Essential (primary) hypertension: Secondary | ICD-10-CM | POA: Diagnosis not present

## 2016-09-29 DIAGNOSIS — M25562 Pain in left knee: Secondary | ICD-10-CM | POA: Diagnosis not present

## 2016-09-29 DIAGNOSIS — Z79899 Other long term (current) drug therapy: Secondary | ICD-10-CM | POA: Diagnosis not present

## 2016-09-29 DIAGNOSIS — Z800 Family history of malignant neoplasm of digestive organs: Secondary | ICD-10-CM

## 2016-09-29 DIAGNOSIS — S79922A Unspecified injury of left thigh, initial encounter: Secondary | ICD-10-CM | POA: Diagnosis not present

## 2016-09-29 DIAGNOSIS — W19XXXA Unspecified fall, initial encounter: Secondary | ICD-10-CM | POA: Insufficient documentation

## 2016-09-29 DIAGNOSIS — M810 Age-related osteoporosis without current pathological fracture: Secondary | ICD-10-CM | POA: Insufficient documentation

## 2016-09-29 DIAGNOSIS — M25552 Pain in left hip: Secondary | ICD-10-CM | POA: Diagnosis not present

## 2016-09-29 DIAGNOSIS — Z8249 Family history of ischemic heart disease and other diseases of the circulatory system: Secondary | ICD-10-CM

## 2016-09-29 LAB — CBC
HEMATOCRIT: 31.8 % — AB (ref 39.0–52.0)
Hemoglobin: 10.7 g/dL — ABNORMAL LOW (ref 13.0–17.0)
MCH: 32.4 pg (ref 26.0–34.0)
MCHC: 33.6 g/dL (ref 30.0–36.0)
MCV: 96.4 fL (ref 78.0–100.0)
PLATELETS: 182 10*3/uL (ref 150–400)
RBC: 3.3 MIL/uL — ABNORMAL LOW (ref 4.22–5.81)
RDW: 13.9 % (ref 11.5–15.5)
WBC: 4.9 10*3/uL (ref 4.0–10.5)

## 2016-09-29 LAB — BASIC METABOLIC PANEL
Anion gap: 6 (ref 5–15)
BUN: 20 mg/dL (ref 6–20)
CALCIUM: 9 mg/dL (ref 8.9–10.3)
CO2: 27 mmol/L (ref 22–32)
CREATININE: 0.92 mg/dL (ref 0.61–1.24)
Chloride: 108 mmol/L (ref 101–111)
GFR calc non Af Amer: >60 mL/min (ref >60)
GLUCOSE: 118 mg/dL — AB (ref 65–99)
Potassium: 3.9 mmol/L (ref 3.5–5.1)
Sodium: 141 mmol/L (ref 135–145)

## 2016-09-29 LAB — CBC WITH DIFFERENTIAL/PLATELET
Basophils Absolute: 0 10*3/uL (ref 0.0–0.1)
Basophils Relative: 0 %
Eosinophils Absolute: 0 10*3/uL (ref 0.0–0.7)
Eosinophils Relative: 0 %
HEMATOCRIT: 36.2 % — AB (ref 39.0–52.0)
Hemoglobin: 12.1 g/dL — ABNORMAL LOW (ref 13.0–17.0)
Lymphocytes Relative: 12 %
Lymphs Abs: 0.8 10*3/uL (ref 0.7–4.0)
MCH: 32.2 pg (ref 26.0–34.0)
MCHC: 33.4 g/dL (ref 30.0–36.0)
MCV: 96.3 fL (ref 78.0–100.0)
MONO ABS: 0.9 10*3/uL (ref 0.1–1.0)
MONOS PCT: 14 %
NEUTROS ABS: 4.8 10*3/uL (ref 1.7–7.7)
Neutrophils Relative %: 74 %
Platelets: 191 10*3/uL (ref 150–400)
RBC: 3.76 MIL/uL — ABNORMAL LOW (ref 4.22–5.81)
RDW: 13.9 % (ref 11.5–15.5)
WBC: 6.5 10*3/uL (ref 4.0–10.5)

## 2016-09-29 LAB — TYPE AND SCREEN
ABO/RH(D): O POS
Antibody Screen: NEGATIVE

## 2016-09-29 LAB — CREATININE, SERUM
Creatinine, Ser: 0.74 mg/dL (ref 0.61–1.24)
GFR calc Af Amer: >60 mL/min (ref >60)
GFR calc non Af Amer: >60 mL/min (ref >60)

## 2016-09-29 LAB — PROTIME-INR
INR: 1.06
Prothrombin Time: 13.8 seconds (ref 11.4–15.2)

## 2016-09-29 MED ORDER — FLUTICASONE PROPIONATE 50 MCG/ACT NA SUSP
2.0000 | Freq: Every day | NASAL | Status: DC
  Administered 2016-09-29 – 2016-10-01 (×3): 2 via NASAL
  Filled 2016-09-29: qty 16

## 2016-09-29 MED ORDER — VENLAFAXINE HCL ER 75 MG PO CP24
75.0000 mg | ORAL_CAPSULE | Freq: Every day | ORAL | Status: DC
  Administered 2016-09-30 – 2016-10-01 (×2): 75 mg via ORAL
  Filled 2016-09-29 (×2): qty 1

## 2016-09-29 MED ORDER — HYDROCODONE-ACETAMINOPHEN 5-325 MG PO TABS
1.0000 | ORAL_TABLET | Freq: Four times a day (QID) | ORAL | Status: DC | PRN
  Administered 2016-09-29 – 2016-09-30 (×4): 1 via ORAL
  Filled 2016-09-29 (×4): qty 1

## 2016-09-29 MED ORDER — MORPHINE SULFATE (PF) 4 MG/ML IV SOLN
4.0000 mg | Freq: Once | INTRAVENOUS | Status: DC
Start: 2016-09-29 — End: 2016-09-29
  Filled 2016-09-29: qty 1

## 2016-09-29 MED ORDER — MORPHINE SULFATE (PF) 2 MG/ML IV SOLN: 0.5000 mg | INTRAVENOUS | Status: DC | PRN

## 2016-09-29 MED ORDER — METHOCARBAMOL 500 MG PO TABS: 500.0000 mg | ORAL_TABLET | Freq: Four times a day (QID) | ORAL | Status: DC | PRN

## 2016-09-29 MED ORDER — DEXTROSE 5 % IV SOLN
500.0000 mg | Freq: Four times a day (QID) | INTRAVENOUS | Status: DC | PRN
  Filled 2016-09-29: qty 5

## 2016-09-29 MED ORDER — POLYETHYLENE GLYCOL 3350 17 GM/SCOOP PO POWD: 17.0000 g | Freq: Every day | ORAL | Status: DC | PRN

## 2016-09-29 MED ORDER — SODIUM CHLORIDE 0.9 % IV SOLN
Freq: Once | INTRAVENOUS | Status: AC
  Administered 2016-09-29: 13:00:00 via INTRAVENOUS

## 2016-09-29 MED ORDER — BISACODYL 10 MG RE SUPP: 10.0000 mg | Freq: Every day | RECTAL | Status: DC | PRN

## 2016-09-29 MED ORDER — LISINOPRIL 10 MG PO TABS
10.0000 mg | ORAL_TABLET | Freq: Every day | ORAL | Status: DC
  Administered 2016-10-01: 10:00:00 10 mg via ORAL
  Filled 2016-09-29 (×2): qty 1

## 2016-09-29 MED ORDER — ROSUVASTATIN CALCIUM 20 MG PO TABS
20.0000 mg | ORAL_TABLET | Freq: Every day | ORAL | Status: DC
  Administered 2016-09-29 – 2016-10-01 (×3): 20 mg via ORAL
  Filled 2016-09-29 (×4): qty 1

## 2016-09-29 MED ORDER — TERAZOSIN HCL 1 MG PO CAPS
1.0000 mg | ORAL_CAPSULE | Freq: Every day | ORAL | Status: DC
  Administered 2016-09-29 – 2016-09-30 (×2): 1 mg via ORAL
  Filled 2016-09-29 (×2): qty 1

## 2016-09-29 MED ORDER — SODIUM CHLORIDE 0.9 % IV SOLN
INTRAVENOUS | Status: DC
  Administered 2016-09-29 – 2016-09-30 (×2): via INTRAVENOUS

## 2016-09-29 MED ORDER — LISINOPRIL-HYDROCHLOROTHIAZIDE 10-12.5 MG PO TABS: 1.0000 | ORAL_TABLET | Freq: Every day | ORAL | Status: DC

## 2016-09-29 MED ORDER — HEPARIN SODIUM (PORCINE) 5000 UNIT/ML IJ SOLN
5000.0000 [IU] | Freq: Three times a day (TID) | INTRAMUSCULAR | Status: DC
  Administered 2016-09-29 – 2016-10-01 (×5): 5000 [IU] via SUBCUTANEOUS
  Filled 2016-09-29 (×7): qty 1

## 2016-09-29 MED ORDER — HYDROCHLOROTHIAZIDE 12.5 MG PO CAPS
12.5000 mg | ORAL_CAPSULE | Freq: Every day | ORAL | Status: DC
  Administered 2016-10-01: 12.5 mg via ORAL
  Filled 2016-09-29 (×2): qty 1

## 2016-09-29 MED ORDER — ASPIRIN EC 81 MG PO TBEC
81.0000 mg | DELAYED_RELEASE_TABLET | Freq: Every day | ORAL | Status: DC
  Administered 2016-09-29 – 2016-10-01 (×3): 81 mg via ORAL
  Filled 2016-09-29 (×4): qty 1

## 2016-09-29 MED ORDER — FOLIC ACID 1 MG PO TABS
1.0000 mg | ORAL_TABLET | Freq: Every day | ORAL | Status: DC
  Administered 2016-09-29 – 2016-10-01 (×3): 1 mg via ORAL
  Filled 2016-09-29 (×3): qty 1

## 2016-09-29 MED ORDER — POLYETHYLENE GLYCOL 3350 17 G PO PACK: 17.0000 g | PACK | Freq: Every day | ORAL | Status: DC | PRN

## 2016-09-29 MED ORDER — DONEPEZIL HCL 10 MG PO TABS
5.0000 mg | ORAL_TABLET | Freq: Every day | ORAL | Status: DC
  Administered 2016-09-29 – 2016-09-30 (×2): 5 mg via ORAL
  Filled 2016-09-29 (×2): qty 1

## 2016-09-29 NOTE — ED Notes (Signed)
Patient given sandwich and ginger ale 

## 2016-09-29 NOTE — ED Notes (Signed)
Patient given ginger ale and ravioli.

## 2016-09-29 NOTE — ED Notes (Signed)
Wrong ortho group put in request , requested [facility]  needed [facility].

## 2016-09-29 NOTE — ED Provider Notes (Signed)
[facility] DEPT Provider Note   CSN: [id number] Arrival date & time: 09/29/16  1002     History   Chief Complaint Chief Complaint  Patient presents with  . Fall    HPI MAVERICK PONTING is a 83 y.o. male.   HPI 83 yo male here with left hip pain. States he was in usual state of health until this morning. Pt apparently tried to get out of bed and fell off bed directly onto left hip. No head injury or LOC. Pt reports immediate onset severe, 8/10, left hip pain worse with movement or palpation. He has been unable to bear weight. No numbness or weakness. No open wounds. No alleviating factors. Pain made worse with any movement.  Past Medical History:  Diagnosis Date  . Colon polyps    Tubular Adenoma 2005  . Diverticulosis   . Hepatitis    pt told by [facility] has hepatitis antibodies in blood   . Hyperlipidemia   . Hypertension   . Melanoma ([place])   . Nonmelanoma skin cancer   . Osteoarthritis    right hip- none since hip replacement  . Polymyalgia rheumatica ([place])   . Polyposis of colon   . Prostate cancer ([place]) 2007   s/p radiation seed implants  . Prostate cancer ([place])   . Thyroid disease sees dr altzhimer for yearly   nodules    Patient Active Problem List   Diagnosis Date Noted  . Mild cognitive impairment 09/23/2016  . Nuclear sclerotic cataract of left eye 04/02/2016  . Cortical age-related cataract of both eyes 01/04/2016  . Degenerative retinal drusen of both eyes 01/04/2016  . Nuclear sclerotic cataract of both eyes 01/04/2016  . Open angle with borderline findings and low glaucoma risk in both eyes 01/04/2016  . Posterior vitreous detachment of both eyes 01/04/2016  . Maxillary sinusitis, acute 11/03/2015  . Diabetes mellitus type II, controlled ([place]) 04/06/2015  . Acute upper respiratory infection 11/11/2014  . Prostate cancer ([place])   . Melanoma ([place])   . Nonmelanoma skin cancer   . Polyposis of colon   . Influenza-like illness 09/30/2013  .  Obesity (BMI 30-39.9) 05/10/2013  . Obese 06/14/2012  . S/P right THA, AA 06/12/2012  . PMR (polymyalgia rheumatica) ([place]) 05/10/2011  . EXTERNAL HEMORRHOIDS 06/09/2010  . DIVERTICULOSIS, COLON 06/09/2010  . COLONIC POLYPS, HX OF 06/09/2010  . UNSPECIFIED VITAMIN D DEFICIENCY 05/18/2010  . TOTAL KNEE REPLACEMENT, LEFT, HX OF 01/22/2009  . Unspecified hearing loss 03/13/2008  . Thyroid nodule 08/14/2007  . PROSTATE CANCER, HX OF 06/18/2007  . Hyperlipidemia 02/28/2007  . Essential hypertension 02/28/2007  . OSTEOARTHRITIS 02/28/2007    Past Surgical History:  Procedure Laterality Date  . CATARACT EXTRACTION    . COLONOSCOPY    . POLYPECTOMY    . TONSILLECTOMY  as child  . TOTAL HIP ARTHROPLASTY  08/27/2008   left  . TOTAL HIP ARTHROPLASTY  06/12/2012   Procedure: TOTAL HIP ARTHROPLASTY ANTERIOR APPROACH;  Surgeon: Mauri Pole, MD;  Location: [facility];  Service: Orthopedics;  Laterality: Right;  . TOTAL KNEE ARTHROPLASTY  12/31/2008   left       Home Medications    Prior to Admission medications   Medication Sig Start Date End Date Taking? Authorizing Provider  alendronate (FOSAMAX) 70 MG tablet Take 70 mg by mouth every 7 (seven) days. Take with a full glass of water on an empty stomach every Friday    Historical Provider, MD  aspirin 81 MG  tablet Take 1 tablet (81 mg total) by mouth daily. 10/06/14   Rosalita Chessman Chase, DO  atorvastatin (LIPITOR) 40 MG tablet TAKE 1 TABLET DAILY 06/27/16   Rosalita Chessman Chase, DO  Calcium Carbonate-Vitamin D (CALCIUM 600 + D PO) Take 1 tablet by mouth every morning.     Historical Provider, MD  Cholecalciferol (VITAMIN D3) 1000 UNITS tablet Take 1,000 Units by mouth every morning.     Historical Provider, MD  donepezil (ARICEPT) 5 MG tablet TAKE 1 TABLET(5 MG) BY MOUTH AT BEDTIME 09/02/16   Alferd Apa Lowne Chase, DO  fluticasone [facility]) 50 MCG/ACT nasal spray USE 2 SPRAYS IN [facility] NOSTRIL DAILY 09/13/16   Marin Olp, MD  folic acid  (FOLVITE) 1 MG tablet Take 1 mg by mouth daily.    Historical Provider, MD  glucose blood (FREESTYLE LITE) test strip Check blood sugar once daily. Dx 250.00 Patient not taking: Reported on 09/23/2016 05/09/14   Rosalita Chessman Chase, DO  Lancets (FREESTYLE) lancets  09/03/14   Historical Provider, MD  lisinopril-hydrochlorothiazide (PRINZIDE,ZESTORETIC) 10-12.5 MG tablet TAKE 1 TABLET DAILY 06/07/16   Rosalita Chessman Chase, DO  methotrexate (RHEUMATREX) 2.5 MG tablet Take 12.5 mg by mouth once a week.     Historical Provider, MD  Multiple Vitamins-Minerals (CENTRUM SILVER ULTRA MENS) TABS Take 1 tablet by mouth every evening.     Historical Provider, MD  polyethylene glycol powder (GLYCOLAX/MIRALAX) powder Take 1 Container by mouth daily. 1 scoop daily    Historical Provider, MD  rosuvastatin (CRESTOR) 20 MG tablet Take 1 tablet (20 mg total) by mouth daily. Patient not taking: Reported on 09/23/2016 04/18/16   Rosalita Chessman Chase, DO  terazosin (HYTRIN) 1 MG capsule TAKE 1 CAPSULE AT BEDTIME 06/21/16   Alferd Apa Lowne Chase, DO  venlafaxine XR (EFFEXOR XR) 75 MG 24 hr capsule Take 1 capsule (75 mg total) by mouth daily with breakfast. 04/18/16   Rosalita Chessman Chase, DO  venlafaxine XR (EFFEXOR-XR) 75 MG 24 hr capsule TAKE 1 CAPSULE DAILY WITH BREAKFAST 09/23/16   Ann Held, DO    Family History Family History  Problem Relation Age of Onset  . Coronary artery disease Mother 51  . Heart disease Mother 31    MI  . Coronary artery disease Father 64  . Pancreatic cancer Father 82    deceased 75  . Pancreatic cancer Brother 22    deceased 44; smoker  . Colon cancer Neg Hx   . Colon polyps Neg Hx   . Esophageal cancer Neg Hx   . Kidney disease Neg Hx   . Diabetes Neg Hx   . Gallbladder disease Neg Hx     Social History Social History  Substance Use Topics  . Smoking status: Never Smoker  . Smokeless tobacco: Never Used  . Alcohol use 0.0 oz/week     Comment: occasional      Allergies   Patient has no known allergies.   Review of Systems Review of Systems  Constitutional: Negative for chills, fatigue and fever.  HENT: Negative for congestion and rhinorrhea.   Eyes: Negative for visual disturbance.  Respiratory: Negative for cough, shortness of breath and wheezing.   Cardiovascular: Negative for chest pain and leg swelling.  Gastrointestinal: Negative for abdominal pain, diarrhea, nausea and vomiting.  Genitourinary: Negative for dysuria and flank pain.  Musculoskeletal: Positive for arthralgias and gait problem. Negative for neck pain and neck stiffness.  Skin: Negative for  rash and wound.  Allergic/Immunologic: Negative for immunocompromised state.  Neurological: Negative for syncope and headaches.  All other systems reviewed and are negative.    Physical Exam Updated Vital Signs BP 105/55 (BP Location: Left Arm)   Pulse 71   Temp 97.8 F (36.6 C) (Oral)   Resp 15   SpO2 99%   Physical Exam  Constitutional: He is oriented to person, place, and time. He appears well-developed and well-nourished. No distress.  HENT:  Head: Normocephalic and atraumatic.  Eyes: Conjunctivae are normal.  Neck: Neck supple.  Cardiovascular: Normal rate, regular rhythm and normal heart sounds.  Exam reveals no friction rub.   No murmur heard. Pulmonary/Chest: Effort normal and breath sounds normal. No respiratory distress. He has no wheezes. He has no rales.  Abdominal: He exhibits no distension.  Musculoskeletal: He exhibits no edema.  Neurological: He is alert and oriented to person, place, and time. He exhibits normal muscle tone.  Skin: Skin is warm. Capillary refill takes less than 2 seconds.  Psychiatric: He has a normal mood and affect.  Nursing note and vitals reviewed.   LOWER EXTREMITY EXAM: LEFT  INSPECTION & PALPATION: Moderate TTP over left hip with marked tenderness with any pROM. No deformity.  SENSORY: sensation is intact to light  touch in:  Superficial peroneal nerve distribution (over dorsum of foot) Deep peroneal nerve distribution (over first dorsal web space) Sural nerve distribution (over lateral aspect 5th metatarsal) Saphenous nerve distribution (over medial instep)  MOTOR:  + Motor EHL (great toe dorsiflexion) + FHL (great toe plantar flexion)  + TA (ankle dorsiflexion)  + GSC (ankle plantar flexion)  VASCULAR: 2+ dorsalis pedis and posterior tibialis pulses Capillary refill < 2 sec, toes warm and well-perfused  COMPARTMENTS: Soft, warm, well-perfused No pain with passive extension No parethesias   ED Treatments / Results  Labs (all labs ordered are listed, but only abnormal results are displayed) Labs Reviewed - No data to display  EKG  EKG Interpretation None       Radiology Dg Knee Complete 4 Views Left  Result Date: 09/29/2016 CLINICAL DATA:  [place] from bed with left knee pain EXAM: LEFT KNEE - COMPLETE 4+ VIEW COMPARISON:  None. FINDINGS: The femoral and tibial components of the left total knee replacement are in good position. No complicating features are seen. No fracture is noted and there is no evidence of joint effusion. IMPRESSION: Left total knee replacement components in good position. No complicating features. Electronically Signed   By: Ivar Drape M.D.   On: 09/29/2016 10:51   Dg Hip Unilat With Pelvis 2-3 Views Left  Result Date: 09/29/2016 CLINICAL DATA:  [place] from bed injuring the left side with left hip pain and knee pain EXAM: DG HIP (WITH OR WITHOUT PELVIS) 2-3V LEFT COMPARISON:  None. FINDINGS: Bilateral total hip replacements are noted. There does appear to be an acute fracture involving the greater trochanter of the left femur without displacement. Multiple prostate implant seeds are present. The pelvic rami are intact. The SI joints are corticated. There are degenerative changes in the lower lumbar spine. IMPRESSION: 1. Fracture of the proximal left femur involving the  left greater trochanter in this patient who was undergone bilateral total hip replacements. 2. Degenerative change of the lower lumbar spine. Electronically Signed   By: Ivar Drape M.D.   On: 09/29/2016 10:50    Procedures Procedures (including critical care time)  Medications Ordered in ED Medications - No data to display  Initial Impression / Assessment and Plan / ED Course  I have reviewed the triage vital signs and the nursing notes.  Pertinent labs & imaging results that were available during my care of the patient were reviewed by me and considered in my medical decision making (see chart for details).  Clinical Course     83 yo M with PMHx as above here with left hip pain s/p mechanical fall. Distal NV is intact. No other trauma. Plain films show L periprosthetic fx. Discussed with Dr. Ronnald Ramp, of [place], who recommends admit to [facility]. Pt will likely not need OR - CT ordered. Pt o/w HDS, well appearing, and labs unremarkable.  Final Clinical Impressions(s) / ED Diagnoses   Final diagnoses:  Fall  Periprosthetic fracture around internal prosthetic left hip joint, initial encounter ([place])      Duffy Bruce, MD 09/29/16 1650

## 2016-09-29 NOTE — H&P (Signed)
History and Physical    Andrew Ramos [id number] DOB: 13-Feb-1932 DOA: 09/29/2016  Referring Provider: Dr. Ellender Hose PCP: Ann Held, DO   Patient coming from: home with wife  Chief Complaint: left hip pain after fall   HPI: Andrew Ramos is a 83 y.o. male with PMH significant for HTN, HLD, depression, osteoporosis, RA, left knee replacement and bilateral hip replacement; presented to ED with pain on his left hip after falling out of bed. Patient reported that he rolled out of bed and landed on his left hip. He has been experiencing a lot of pain and difficulty with weight bearing. No CP, no SOB, no fever, no nausea, no vomiting, no abd pain, no dysuria.   ED Course: patient with multiple x-rays to assess for any fractures. CT head w/o acute abnormalities. Found to have left periprosthetic hip fracture. Orthopedic service consulted and [facility] called to admit as per hip protocol.  Review of Systems:  All other systems reviewed and apart from HPI, are negative.  Past Medical History:  Diagnosis Date  . Colon polyps    Tubular Adenoma 2005  . Diverticulosis   . Hepatitis    pt told by [facility] cross has hepatitis antibodies in blood   . Hyperlipidemia   . Hypertension   . Melanoma ([place])   . Nonmelanoma skin cancer   . Osteoarthritis    right hip- none since hip replacement  . Polymyalgia rheumatica ([place])   . Polyposis of colon   . Prostate cancer ([place]) 2007   s/p radiation seed implants  . Prostate cancer ([place])   . Thyroid disease sees dr altzhimer for yearly   nodules    Past Surgical History:  Procedure Laterality Date  . CATARACT EXTRACTION    . COLONOSCOPY    . POLYPECTOMY    . TONSILLECTOMY  as child  . TOTAL HIP ARTHROPLASTY  08/27/2008   left  . TOTAL HIP ARTHROPLASTY  06/12/2012   Procedure: TOTAL HIP ARTHROPLASTY ANTERIOR APPROACH;  Surgeon: Mauri Pole, MD;  Location: [facility];  Service: Orthopedics;  Laterality: Right;  . TOTAL KNEE ARTHROPLASTY   12/31/2008   left     reports that he has never smoked. He has never used smokeless tobacco. He reports that he drinks alcohol. He reports that he does not use drugs.  No Known Allergies  Family History  Problem Relation Age of Onset  . Coronary artery disease Mother 87  . Heart disease Mother 44    MI  . Coronary artery disease Father 23  . Pancreatic cancer Father 22    deceased 45  . Pancreatic cancer Brother 108    deceased 15; smoker  . Colon cancer Neg Hx   . Colon polyps Neg Hx   . Esophageal cancer Neg Hx   . Kidney disease Neg Hx   . Diabetes Neg Hx   . Gallbladder disease Neg Hx      Prior to Admission medications   Medication Sig Start Date End Date Taking? Authorizing Provider  alendronate (FOSAMAX) 70 MG tablet Take 70 mg by mouth every 7 (seven) days. Take with a full glass of water on an empty stomach every Friday   Yes Historical Provider, MD  amoxicillin (AMOXIL) 500 MG tablet Take 2,000 mg by mouth once as needed (dental procedures).   Yes Historical Provider, MD  aspirin 81 MG tablet Take 1 tablet (81 mg total) by mouth daily. 10/06/14  Yes Ann Held, DO  Calcium Carbonate-Vitamin D (CALCIUM 600 + D PO) Take 1 tablet by mouth every morning.    Yes Historical Provider, MD  Cholecalciferol (VITAMIN D3) 1000 UNITS tablet Take 1,000 Units by mouth every morning.    Yes Historical Provider, MD  donepezil (ARICEPT) 5 MG tablet TAKE 1 TABLET(5 MG) BY MOUTH AT BEDTIME Patient taking differently: TAKE 5 MG BY MOUTH AT BEDTIME 09/02/16  Yes Yvonne R Lowne Chase, DO  fluticasone [facility]) 50 MCG/ACT nasal spray USE 2 SPRAYS IN EACH NOSTRIL DAILY 09/13/16  Yes Marin Olp, MD  folic acid (FOLVITE) 1 MG tablet Take 1 mg by mouth daily.   Yes Historical Provider, MD  glucose blood (FREESTYLE LITE) test strip Check blood sugar once daily. Dx 250.00 05/09/14  Yes Yvonne R Lowne Chase, DO  lisinopril-hydrochlorothiazide (PRINZIDE,ZESTORETIC) 10-12.5 MG tablet TAKE  1 TABLET DAILY 06/07/16  Yes Alferd Apa Lowne Chase, DO  methotrexate (RHEUMATREX) 2.5 MG tablet Take 12.5 mg by mouth every Wednesday.    Yes Historical Provider, MD  Multiple Vitamins-Minerals (CENTRUM SILVER ULTRA MENS) TABS Take 1 tablet by mouth every evening.    Yes Historical Provider, MD  polyethylene glycol powder (GLYCOLAX/MIRALAX) powder Take 17 g by mouth daily as needed for moderate constipation. 1 scoop daily    Yes Historical Provider, MD  rosuvastatin (CRESTOR) 20 MG tablet Take 1 tablet (20 mg total) by mouth daily. 04/18/16  Yes Yvonne R Lowne Chase, DO  terazosin (HYTRIN) 1 MG capsule TAKE 1 CAPSULE AT BEDTIME Patient taking differently: TAKE 1mg  CAPSULE AT BEDTIME 06/21/16  Yes Yvonne R Lowne Chase, DO  venlafaxine XR (EFFEXOR-XR) 75 MG 24 hr capsule TAKE 1 CAPSULE DAILY WITH BREAKFAST Patient taking differently: TAKE 75mg  CAPSULE DAILY WITH BREAKFAST 09/23/16  Yes Ann Held, DO    Physical Exam: Vitals:   09/29/16 1441 09/29/16 1600 09/29/16 1630 09/29/16 1800  BP: 123/84 111/59 139/72 117/61  Pulse: 84 67 103 73  Resp: 17 18 18 19   Temp:      TempSrc:      SpO2: 100% 98% 100% 95%    Constitutional: NAD, calm, comfortable (if no moving). No CP, no SOB, no nausea or vomiting. Eyes: PERTLA, lids and conjunctivae normal ENMT: Mucous membranes are moist. Posterior pharynx clear of any exudate or lesions. Normal dentition.  Neck: normal, supple, no masses, no thyromegaly, no JVD Respiratory: clear to auscultation bilaterally, no wheezing, no crackles. Normal respiratory effort. No accessory muscle use.  Cardiovascular: S1 & S2 heard, regular rate and rhythm, no murmurs / rubs / gallops. No extremity edema. 2+ pedal pulses. No carotid bruits.  Abdomen: No distension, no tenderness, no masses palpated. No hepatosplenomegaly. Bowel sounds normal.  Musculoskeletal: no clubbing / cyanosis. No contractures, pain on his Left hip/leg with movement, decrease range of motion in  the leg. Skin: no rashes, lesions, ulcers. No induration Neurologic: CN 2-12 grossly intact. Sensation intact, DTR normal. Normal Strength on his RLE, decrease strength on LLE due to pain on his hip with any movements..  Psychiatric: Normal judgment and insight. Alert and oriented x 3. Normal mood.    Labs on Admission: I have personally reviewed following labs and imaging studies  CBC:  Recent Labs Lab 09/29/16 1222  WBC 6.5  NEUTROABS 4.8  HGB 12.1*  HCT 36.2*  MCV 96.3  PLT 99991111   Basic Metabolic Panel:  Recent Labs Lab 09/29/16 1222  NA 141  K 3.9  CL 108  CO2 27  GLUCOSE 118*  BUN 20  CREATININE 0.92  CALCIUM 9.0   GFR: Estimated Creatinine Clearance: 82 mL/min (by C-G formula based on SCr of 0.92 mg/dL).  Coagulation Profile:  Recent Labs Lab 09/29/16 1222  INR 1.06   Urine analysis:    Component Value Date/Time   COLORURINE YELLOW 06/11/2012 0900   APPEARANCEUR CLEAR 06/11/2012 0900   LABSPEC 1.024 06/11/2012 0900   PHURINE 6.5 06/11/2012 0900   GLUCOSEU NEGATIVE 06/11/2012 0900   HGBUR NEGATIVE 06/11/2012 0900   HGBUR negative 06/18/2007 0000   BILIRUBINUR neg 07/18/2016 1143   KETONESUR NEGATIVE 06/11/2012 0900   PROTEINUR neg 07/18/2016 1143   PROTEINUR NEGATIVE 06/11/2012 0900   UROBILINOGEN 0.2 07/18/2016 1143   UROBILINOGEN 0.2 06/11/2012 0900   NITRITE neg 07/18/2016 1143   NITRITE NEGATIVE 06/11/2012 0900   LEUKOCYTESUR Negative 07/18/2016 1143   Sepsis Labs: @LABRCNTIP (procalcitonin:4,lacticidven:4) )No results found for this or any previous visit (from the past 240 hour(s)).   Radiological Exams on Admission: Dg Chest 1 View  Result Date: 09/29/2016 CLINICAL DATA:  [place] out of bed this morning on left side EXAM: CHEST 1 VIEW COMPARISON:  10/08/2013 FINDINGS: Cardiomediastinal silhouette is stable. No infiltrate or pulmonary edema. Mild atherosclerotic calcifications of thoracic aorta. There is no pneumothorax. IMPRESSION: No  active disease. Electronically Signed   By: Lahoma Crocker M.D.   On: 09/29/2016 12:01   Ct Head Wo Contrast  Result Date: 09/29/2016 CLINICAL DATA:  Fall, left hip pain. EXAM: CT HEAD WITHOUT CONTRAST TECHNIQUE: Contiguous axial images were obtained from the base of the skull through the vertex without intravenous contrast. COMPARISON:  None. FINDINGS: Brain: No acute intracranial abnormality. Specifically, no hemorrhage, hydrocephalus, mass lesion, acute infarction, or significant intracranial injury. Vascular: No hyperdense vessel or unexpected calcification. Skull: No acute calvarial abnormality. Sinuses/Orbits: Mucosal thickening throughout the right paranasal sinuses. Mastoid air cells are clear. Orbital soft tissues unremarkable. Other: None IMPRESSION: No acute intracranial abnormality. Chronic sinusitis in the right paranasal sinuses. Electronically Signed   By: Rolm Baptise M.D.   On: 09/29/2016 13:40   Ct Hip Left Wo Contrast  Result Date: 09/29/2016 CLINICAL DATA:  Left hip pain, fall. EXAM: CT OF THE LEFT HIP WITHOUT CONTRAST TECHNIQUE: Multidetector CT imaging of the left hip was performed according to the standard protocol. Multiplanar CT image reconstructions were also generated. COMPARISON:  09/29/2016 FINDINGS: Bones/Joint/Cartilage Acute greater trochanteric and periprosthetic fracture on the left extending along the proximal 5.5 cm of the stem of the femoral component of the left total hip prosthesis. Mild comminution. Abnormal transverse sclerosis in the sacrum at approximately the S3 level suspicious for a sacral insufficiency fracture. Spurring along the lesser trochanter appears chronic. Ligaments Suboptimally assessed by CT. Muscles and Tendons Hematoma in the proximal vastus musculature. Soft tissues Fluid in the trochanteric bursa. Common femoral artery atherosclerotic calcification. Seed implants in the prostate gland. IMPRESSION: 1. Acute mildly comminuted greater trochanteric and  periprosthetic fracture on the left, extending along the proximal 5.5 cm of the stem of the femoral component of the hip implant. Associated hematoma in the proximal vastus musculature and fluid in the trochanteric bursa. 2. Sclerotic band transversely in the sacrum at about the S3 level suspicious for sacral insufficiency fracture. This is either subacute or chronic. Electronically Signed   By: Van Clines M.D.   On: 09/29/2016 13:51   Dg Knee Complete 4 Views Left  Result Date: 09/29/2016 CLINICAL DATA:  [place] from bed with left knee pain EXAM: LEFT KNEE - COMPLETE 4+  VIEW COMPARISON:  None. FINDINGS: The femoral and tibial components of the left total knee replacement are in good position. No complicating features are seen. No fracture is noted and there is no evidence of joint effusion. IMPRESSION: Left total knee replacement components in good position. No complicating features. Electronically Signed   By: Ivar Drape M.D.   On: 09/29/2016 10:51   Dg Hip Unilat With Pelvis 2-3 Views Left  Result Date: 09/29/2016 CLINICAL DATA:  [place] from bed injuring the left side with left hip pain and knee pain EXAM: DG HIP (WITH OR WITHOUT PELVIS) 2-3V LEFT COMPARISON:  None. FINDINGS: Bilateral total hip replacements are noted. There does appear to be an acute fracture involving the greater trochanter of the left femur without displacement. Multiple prostate implant seeds are present. The pelvic rami are intact. The SI joints are corticated. There are degenerative changes in the lower lumbar spine. IMPRESSION: 1. Fracture of the proximal left femur involving the left greater trochanter in this patient who was undergone bilateral total hip replacements. 2. Degenerative change of the lower lumbar spine. Electronically Signed   By: Ivar Drape M.D.   On: 09/29/2016 10:50   Dg Femur Min 2 Views Left  Result Date: 09/29/2016 CLINICAL DATA:  [place] out of bed this morning.  Left leg pain. EXAM: LEFT FEMUR 2 VIEWS  COMPARISON:  Hip and knee films, same date. FINDINGS: The hip joint is not included. A prior pelvis film was performed. No femoral shaft fractures are identified. The knee components are intact. IMPRESSION: No femoral shaft fracture Electronically Signed   By: Marijo Sanes M.D.   On: 09/29/2016 12:02    EKG:  No acute ischemic changes   Assessment/Plan 1-Closed left hip fracture [facility]): patient feel out of bed while sleeping.  -orthopedic service consulted; currently they feel not surgical intervention is required -will admit for observation, PT/OT evaluation and pain control -50% weight bearing on his left leg and no Abduction recommended  -will follow rec's  2-Essential hypertension -stable -will continue home antihypertensive regimen   3-mild dementia -continue aricept  4-HLD -will continue statins  5-depression -will continue effexor  6-hx of prostate cancer with BPH -continue terazosin   7-hx of osteoporosis and RA -will hold foxamax and methotrexate while inpatient   Time: 50 minutes   DVT prophylaxis: heparin  Code Status: Full code Family Communication: no family at bedside   Disposition Plan: to be determine; but anticipate discharge no longer than 2 midnights  Consults called: orthopedic service (Dr. Stann Mainland) Admission status: observation, LOS < 2 midnights, med-surg     Barton Dubois MD [facility] Pager [phone]  If 7PM-7AM, please contact night-coverage [email or web address] Password [facility]  09/29/2016, 7:35 PM

## 2016-09-29 NOTE — ED Notes (Signed)
Bed: WA19 Expected date:  Expected time:  Means of arrival:  Comments: EMS-fall 

## 2016-09-29 NOTE — Consult Note (Signed)
ORTHOPAEDIC CONSULTATION  REQUESTING PHYSICIAN: Barton Dubois, MD  PCP:  Ann Held, DO  Chief Complaint: Left hip pain/ fracture following a fall  HPI: Andrew Ramos is a 83 y.o. male who complains of  Left hip pain following a ground level fall early this am at his home.  He was unable to bear weight without pain and presented to the ED for evaluation. Denies distal neurologic deficits.  Currently his is a few years out form his left posterior THA and doing well up until this fall.  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    Past Medical History:  Diagnosis Date  . Colon polyps    Tubular Adenoma 2005  . Diverticulosis   . Hepatitis    pt told by red cross has hepatitis antibodies in blood   . Hyperlipidemia   . Hypertension   . Melanoma ([place])   . Nonmelanoma skin cancer   . Osteoarthritis    right hip- none since hip replacement  . Polymyalgia rheumatica ([place])   . Polyposis of colon   . Prostate cancer ([place]) 2007   s/p radiation seed implants  . Prostate cancer ([place])   . Thyroid disease sees dr altzhimer for yearly   nodules   Past Surgical History:  Procedure Laterality Date  . CATARACT EXTRACTION    . COLONOSCOPY    . POLYPECTOMY    . TONSILLECTOMY  as child  . TOTAL HIP ARTHROPLASTY  08/27/2008   left  . TOTAL HIP ARTHROPLASTY  06/12/2012   Procedure: TOTAL HIP ARTHROPLASTY ANTERIOR APPROACH;  Surgeon: Mauri Pole, MD;  Location: [facility];  Service: Orthopedics;  Laterality: Right;  . TOTAL KNEE ARTHROPLASTY  12/31/2008   left   Social History   Social History  . Marital status: Married    Spouse name: N/A  . Number of children: 2  . Years of education: N/A   Occupational History  . retired Nature conservation officer Retired   Social History Main Topics  . Smoking status: Never Smoker  . Smokeless tobacco: Never Used  . Alcohol use 0.0 oz/week     Comment: occasional  . Drug use: No  . Sexual activity: Yes    Partners:  Female   Other Topics Concern  . None   Social History Narrative   Exercise--no   Family History  Problem Relation Age of Onset  . Coronary artery disease Mother 61  . Heart disease Mother 27    MI  . Coronary artery disease Father 6  . Pancreatic cancer Father 49    deceased 34  . Pancreatic cancer Brother 17    deceased 22; smoker  . Colon cancer Neg Hx   . Colon polyps Neg Hx   . Esophageal cancer Neg Hx   . Kidney disease Neg Hx   . Diabetes Neg Hx   . Gallbladder disease Neg Hx    No Known Allergies Prior to Admission medications   Medication Sig Start Date End Date Taking? Authorizing Provider  alendronate (FOSAMAX) 70 MG tablet Take 70 mg by mouth every 7 (seven) days. Take with a full glass of water on an empty stomach every Friday   Yes Historical Provider, MD  amoxicillin (AMOXIL) 500 MG tablet Take 2,000 mg by mouth once as needed (dental procedures).   Yes Historical Provider, MD  aspirin 81 MG tablet Take 1 tablet (81 mg total) by mouth daily. 10/06/14  Yes Ann Held,  DO  Calcium Carbonate-Vitamin D (CALCIUM 600 + D PO) Take 1 tablet by mouth every morning.    Yes Historical Provider, MD  Cholecalciferol (VITAMIN D3) 1000 UNITS tablet Take 1,000 Units by mouth every morning.    Yes Historical Provider, MD  donepezil (ARICEPT) 5 MG tablet TAKE 1 TABLET(5 MG) BY MOUTH AT BEDTIME Patient taking differently: TAKE 5 MG BY MOUTH AT BEDTIME 09/02/16  Yes Yvonne R Lowne Chase, DO  fluticasone [facility]) 50 MCG/ACT nasal spray USE 2 SPRAYS IN EACH NOSTRIL DAILY 09/13/16  Yes Marin Olp, MD  folic acid (FOLVITE) 1 MG tablet Take 1 mg by mouth daily.   Yes Historical Provider, MD  glucose blood (FREESTYLE LITE) test strip Check blood sugar once daily. Dx 250.00 05/09/14  Yes Yvonne R Lowne Chase, DO  lisinopril-hydrochlorothiazide (PRINZIDE,ZESTORETIC) 10-12.5 MG tablet TAKE 1 TABLET DAILY 06/07/16  Yes Alferd Apa Lowne Chase, DO  methotrexate (RHEUMATREX) 2.5 MG  tablet Take 12.5 mg by mouth every Wednesday.    Yes Historical Provider, MD  Multiple Vitamins-Minerals (CENTRUM SILVER ULTRA MENS) TABS Take 1 tablet by mouth every evening.    Yes Historical Provider, MD  polyethylene glycol powder (GLYCOLAX/MIRALAX) powder Take 17 g by mouth daily as needed for moderate constipation. 1 scoop daily    Yes Historical Provider, MD  rosuvastatin (CRESTOR) 20 MG tablet Take 1 tablet (20 mg total) by mouth daily. 04/18/16  Yes Yvonne R Lowne Chase, DO  terazosin (HYTRIN) 1 MG capsule TAKE 1 CAPSULE AT BEDTIME Patient taking differently: TAKE 1mg  CAPSULE AT BEDTIME 06/21/16  Yes Yvonne R Lowne Chase, DO  venlafaxine XR (EFFEXOR-XR) 75 MG 24 hr capsule TAKE 1 CAPSULE DAILY WITH BREAKFAST Patient taking differently: TAKE 75mg  CAPSULE DAILY WITH BREAKFAST 09/23/16  Yes Yvonne R Lowne Chase, DO  atorvastatin (LIPITOR) 40 MG tablet TAKE 1 TABLET DAILY Patient not taking: Reported on 09/29/2016 06/27/16   Ann Held, DO   Dg Chest 1 View  Result Date: 09/29/2016 CLINICAL DATA:  [place] out of bed this morning on left side EXAM: CHEST 1 VIEW COMPARISON:  10/08/2013 FINDINGS: Cardiomediastinal silhouette is stable. No infiltrate or pulmonary edema. Mild atherosclerotic calcifications of thoracic aorta. There is no pneumothorax. IMPRESSION: No active disease. Electronically Signed   By: Lahoma Crocker M.D.   On: 09/29/2016 12:01   Ct Head Wo Contrast  Result Date: 09/29/2016 CLINICAL DATA:  Fall, left hip pain. EXAM: CT HEAD WITHOUT CONTRAST TECHNIQUE: Contiguous axial images were obtained from the base of the skull through the vertex without intravenous contrast. COMPARISON:  None. FINDINGS: Brain: No acute intracranial abnormality. Specifically, no hemorrhage, hydrocephalus, mass lesion, acute infarction, or significant intracranial injury. Vascular: No hyperdense vessel or unexpected calcification. Skull: No acute calvarial abnormality. Sinuses/Orbits: Mucosal thickening  throughout the right paranasal sinuses. Mastoid air cells are clear. Orbital soft tissues unremarkable. Other: None IMPRESSION: No acute intracranial abnormality. Chronic sinusitis in the right paranasal sinuses. Electronically Signed   By: Rolm Baptise M.D.   On: 09/29/2016 13:40   Ct Hip Left Wo Contrast  Result Date: 09/29/2016 CLINICAL DATA:  Left hip pain, fall. EXAM: CT OF THE LEFT HIP WITHOUT CONTRAST TECHNIQUE: Multidetector CT imaging of the left hip was performed according to the standard protocol. Multiplanar CT image reconstructions were also generated. COMPARISON:  09/29/2016 FINDINGS: Bones/Joint/Cartilage Acute greater trochanteric and periprosthetic fracture on the left extending along the proximal 5.5 cm of the stem of the femoral component of the left total hip prosthesis.  Mild comminution. Abnormal transverse sclerosis in the sacrum at approximately the S3 level suspicious for a sacral insufficiency fracture. Spurring along the lesser trochanter appears chronic. Ligaments Suboptimally assessed by CT. Muscles and Tendons Hematoma in the proximal vastus musculature. Soft tissues Fluid in the trochanteric bursa. Common femoral artery atherosclerotic calcification. Seed implants in the prostate gland. IMPRESSION: 1. Acute mildly comminuted greater trochanteric and periprosthetic fracture on the left, extending along the proximal 5.5 cm of the stem of the femoral component of the hip implant. Associated hematoma in the proximal vastus musculature and fluid in the trochanteric bursa. 2. Sclerotic band transversely in the sacrum at about the S3 level suspicious for sacral insufficiency fracture. This is either subacute or chronic. Electronically Signed   By: Van Clines M.D.   On: 09/29/2016 13:51   Dg Knee Complete 4 Views Left  Result Date: 09/29/2016 CLINICAL DATA:  [place] from bed with left knee pain EXAM: LEFT KNEE - COMPLETE 4+ VIEW COMPARISON:  None. FINDINGS: The femoral and tibial  components of the left total knee replacement are in good position. No complicating features are seen. No fracture is noted and there is no evidence of joint effusion. IMPRESSION: Left total knee replacement components in good position. No complicating features. Electronically Signed   By: Ivar Drape M.D.   On: 09/29/2016 10:51   Dg Hip Unilat With Pelvis 2-3 Views Left  Result Date: 09/29/2016 CLINICAL DATA:  [place] from bed injuring the left side with left hip pain and knee pain EXAM: DG HIP (WITH OR WITHOUT PELVIS) 2-3V LEFT COMPARISON:  None. FINDINGS: Bilateral total hip replacements are noted. There does appear to be an acute fracture involving the greater trochanter of the left femur without displacement. Multiple prostate implant seeds are present. The pelvic rami are intact. The SI joints are corticated. There are degenerative changes in the lower lumbar spine. IMPRESSION: 1. Fracture of the proximal left femur involving the left greater trochanter in this patient who was undergone bilateral total hip replacements. 2. Degenerative change of the lower lumbar spine. Electronically Signed   By: Ivar Drape M.D.   On: 09/29/2016 10:50   Dg Femur Min 2 Views Left  Result Date: 09/29/2016 CLINICAL DATA:  [place] out of bed this morning.  Left leg pain. EXAM: LEFT FEMUR 2 VIEWS COMPARISON:  Hip and knee films, same date. FINDINGS: The hip joint is not included. A prior pelvis film was performed. No femoral shaft fractures are identified. The knee components are intact. IMPRESSION: No femoral shaft fracture Electronically Signed   By: Marijo Sanes M.D.   On: 09/29/2016 12:02    Positive ROS: All other systems have been reviewed and were otherwise negative with the exception of those mentioned in the HPI and as above.  Physical Exam: General: Alert, no acute distress Cardiovascular: No pedal edema Respiratory: No cyanosis, no use of accessory musculature GI: No organomegaly, abdomen is soft and  non-tender Skin: No lesions in the area of chief complaint Neurologic: Sensation intact distally Psychiatric: Patient is competent for consent with normal mood and affect Lymphatic: No axillary or cervical lymphadenopathy  MUSCULOSKELETAL:  LLE- symmetric alignment and rotation with the right.  TTP along greater trochanter, increased pain with Abduction.  Distally neurologically intact 2+ DP pulse  Assessment: Left hip perioprosthetic greater trochanteric fracture with minimal displacement  Plan: -on review of xray and CT scan this looks to be a stable fracture pattern with no extension to medial of the femoral component.  And thus would recommend nonop treatment -will need inpatient PT eval and recommendations for patient -PT for 50% weight bearing and no active Ab duction of the left hip training -will need close outpatient follow up to assure no further displacement -I have discussed this case with my partner Dr. Lyla Glassing who will follow up this patient as an outpatient 2 weeks after dc from hospital.    Andrew Ramos, [place] [phone]    09/29/2016 4:46 PM

## 2016-09-29 NOTE — ED Triage Notes (Signed)
Per PTAR, states patient rolled out of bed at 0230 this am-fell on left side-complaining of left hip pain-has had hip replacements in past

## 2016-09-30 DIAGNOSIS — Y92013 Bedroom of single-family (private) house as the place of occurrence of the external cause: Secondary | ICD-10-CM | POA: Diagnosis not present

## 2016-09-30 DIAGNOSIS — Z7982 Long term (current) use of aspirin: Secondary | ICD-10-CM | POA: Diagnosis not present

## 2016-09-30 DIAGNOSIS — S72112A Displaced fracture of greater trochanter of left femur, initial encounter for closed fracture: Secondary | ICD-10-CM | POA: Diagnosis present

## 2016-09-30 DIAGNOSIS — Z8546 Personal history of malignant neoplasm of prostate: Secondary | ICD-10-CM | POA: Diagnosis not present

## 2016-09-30 DIAGNOSIS — E1136 Type 2 diabetes mellitus with diabetic cataract: Secondary | ICD-10-CM | POA: Diagnosis present

## 2016-09-30 DIAGNOSIS — E785 Hyperlipidemia, unspecified: Secondary | ICD-10-CM | POA: Diagnosis present

## 2016-09-30 DIAGNOSIS — M810 Age-related osteoporosis without current pathological fracture: Secondary | ICD-10-CM | POA: Diagnosis present

## 2016-09-30 DIAGNOSIS — S72002D Fracture of unspecified part of neck of left femur, subsequent encounter for closed fracture with routine healing: Secondary | ICD-10-CM | POA: Diagnosis not present

## 2016-09-30 DIAGNOSIS — S72009A Fracture of unspecified part of neck of unspecified femur, initial encounter for closed fracture: Secondary | ICD-10-CM | POA: Diagnosis not present

## 2016-09-30 DIAGNOSIS — M25552 Pain in left hip: Secondary | ICD-10-CM | POA: Diagnosis not present

## 2016-09-30 DIAGNOSIS — M353 Polymyalgia rheumatica: Secondary | ICD-10-CM | POA: Diagnosis present

## 2016-09-30 DIAGNOSIS — F329 Major depressive disorder, single episode, unspecified: Secondary | ICD-10-CM | POA: Diagnosis present

## 2016-09-30 DIAGNOSIS — M9702XA Periprosthetic fracture around internal prosthetic left hip joint, initial encounter: Principal | ICD-10-CM

## 2016-09-30 DIAGNOSIS — F0390 Unspecified dementia without behavioral disturbance: Secondary | ICD-10-CM | POA: Diagnosis present

## 2016-09-30 DIAGNOSIS — I10 Essential (primary) hypertension: Secondary | ICD-10-CM

## 2016-09-30 DIAGNOSIS — M069 Rheumatoid arthritis, unspecified: Secondary | ICD-10-CM | POA: Diagnosis present

## 2016-09-30 DIAGNOSIS — R531 Weakness: Secondary | ICD-10-CM | POA: Diagnosis not present

## 2016-09-30 DIAGNOSIS — Z8249 Family history of ischemic heart disease and other diseases of the circulatory system: Secondary | ICD-10-CM | POA: Diagnosis not present

## 2016-09-30 DIAGNOSIS — Z79899 Other long term (current) drug therapy: Secondary | ICD-10-CM | POA: Diagnosis not present

## 2016-09-30 DIAGNOSIS — Z96652 Presence of left artificial knee joint: Secondary | ICD-10-CM | POA: Diagnosis present

## 2016-09-30 DIAGNOSIS — W06XXXA Fall from bed, initial encounter: Secondary | ICD-10-CM | POA: Diagnosis not present

## 2016-09-30 DIAGNOSIS — Z923 Personal history of irradiation: Secondary | ICD-10-CM | POA: Diagnosis not present

## 2016-09-30 DIAGNOSIS — H40013 Open angle with borderline findings, low risk, bilateral: Secondary | ICD-10-CM | POA: Diagnosis present

## 2016-09-30 DIAGNOSIS — Z800 Family history of malignant neoplasm of digestive organs: Secondary | ICD-10-CM | POA: Diagnosis not present

## 2016-09-30 DIAGNOSIS — N400 Enlarged prostate without lower urinary tract symptoms: Secondary | ICD-10-CM | POA: Diagnosis present

## 2016-09-30 DIAGNOSIS — Z96643 Presence of artificial hip joint, bilateral: Secondary | ICD-10-CM | POA: Diagnosis present

## 2016-09-30 LAB — CBC
HCT: 30.6 % — ABNORMAL LOW (ref 39.0–52.0)
HEMOGLOBIN: 10.2 g/dL — AB (ref 13.0–17.0)
MCH: 32.2 pg (ref 26.0–34.0)
MCHC: 33.3 g/dL (ref 30.0–36.0)
MCV: 96.5 fL (ref 78.0–100.0)
Platelets: 167 10*3/uL (ref 150–400)
RBC: 3.17 MIL/uL — ABNORMAL LOW (ref 4.22–5.81)
RDW: 13.9 % (ref 11.5–15.5)
WBC: 4.4 10*3/uL (ref 4.0–10.5)

## 2016-09-30 LAB — BASIC METABOLIC PANEL
Anion gap: 5 (ref 5–15)
BUN: 20 mg/dL (ref 6–20)
CHLORIDE: 109 mmol/L (ref 101–111)
CO2: 26 mmol/L (ref 22–32)
CREATININE: 0.81 mg/dL (ref 0.61–1.24)
Calcium: 8.2 mg/dL — ABNORMAL LOW (ref 8.9–10.3)
GFR calc Af Amer: >60 mL/min (ref >60)
GFR calc non Af Amer: >60 mL/min (ref >60)
Glucose, Bld: 129 mg/dL — ABNORMAL HIGH (ref 65–99)
Potassium: 3.9 mmol/L (ref 3.5–5.1)
SODIUM: 140 mmol/L (ref 135–145)

## 2016-09-30 MED ORDER — ADULT MULTIVITAMIN W/MINERALS CH
1.0000 | ORAL_TABLET | Freq: Every day | ORAL | Status: DC
  Administered 2016-10-01: 10:00:00 1 via ORAL
  Filled 2016-09-30: qty 1

## 2016-09-30 MED ORDER — ENSURE ENLIVE PO LIQD
237.0000 mL | Freq: Two times a day (BID) | ORAL | Status: DC
  Administered 2016-10-01: 237 mL via ORAL

## 2016-09-30 NOTE — Progress Notes (Signed)
Pt active with [facility].

## 2016-09-30 NOTE — Progress Notes (Signed)
Physical Therapy Treatment Patient Details Name: Andrew Ramos MRN: [id number] DOB: Sep 05, 1932 Today's Date: 09/30/2016    History of Present Illness 83 year old male admitted with L hip periprosthetic greater trochanteric fx after falling from bed at home.  Orthopedics consulted-nonoperative management. PMH:  depression, RA, bil THA, prostate cancer, Hep.     PT Comments    Pt continuing to participate well. Reviewed PWB status and NO hip ABD precaution. Pt remains at risk for falls. Recommend 24 hour supervision/assist after discharge. Will continue to follow and progress activity as tolerated. Pt declined back to bed at end of session. Indicated precautions (PWB and no hip abd) on white board in room.    Follow Up Recommendations  Home health PT;Supervision/Assistance - 24 hour     Equipment Recommendations  None recommended by PT    Recommendations for Other Services       Precautions / Restrictions Precautions Precautions: Fall Precaution Comments: NO ABDUCTION Restrictions Weight Bearing Restrictions: Yes LLE Weight Bearing: Partial weight bearing LLE Partial Weight Bearing Percentage or Pounds: 50%    Mobility  Bed Mobility Overal bed mobility: Needs Assistance Bed Mobility: Supine to Sit     Supine to sit: Min assist;HOB elevated     General bed mobility comments: OOB in recliner  Transfers Overall transfer level: Needs assistance Equipment used: Rolling walker (2 wheeled) Transfers: Sit to/from Stand Sit to Stand: Min assist         General transfer comment: Assist to rise, stabilize, control descent. VCs safety, technique, hand placement  Ambulation/Gait Ambulation/Gait assistance: Min assist Ambulation Distance (Feet): 50 Feet Assistive device: Rolling walker (2 wheeled) Gait Pattern/deviations: Step-to pattern     General Gait Details: Intermittent assist to stabilize. Cues for safety, pacing, adherence to PWB status.    Stairs            Wheelchair Mobility    Modified Rankin (Stroke Patients Only)       Balance Overall balance assessment: Needs assistance;History of Falls         Standing balance support: Bilateral upper extremity supported Standing balance-Leahy Scale: Poor                      Cognition Arousal/Alertness: Awake/alert Behavior During Therapy: WFL for tasks assessed/performed Overall Cognitive Status: Within Functional Limits for tasks assessed                      Exercises      General Comments        Pertinent Vitals/Pain Pain Assessment: Faces Faces Pain Scale: Hurts little more Pain Location: L hip with movement Pain Intervention(s): Limited activity within patient's tolerance;Monitored during session;Repositioned    Home Living Family/patient expects to be discharged to:: Private residence Living Arrangements: Spouse/significant other Available Help at Discharge: Family Type of Home: House Home Access: Stairs to enter Entrance Stairs-Rails: None Home Layout: One level        Prior Function Level of Independence: Independent      Comments: wife has sarcoidosis; pt does meals   PT Goals (current goals can now be found in the care plan section) Acute Rehab PT Goals Patient Stated Goal: home PT Goal Formulation: With patient Time For Goal Achievement: 10/14/16 Potential to Achieve Goals: Good Progress towards PT goals: Progressing toward goals    Frequency    Min 4X/week      PT Plan Current plan remains appropriate    Co-evaluation  Reason for Co-Treatment: For patient/therapist safety PT goals addressed during session: Mobility/safety with mobility OT goals addressed during session: ADL's and self-care     End of Session Equipment Utilized During Treatment: Gait belt Activity Tolerance: Patient tolerated treatment well Patient left: in chair;with call bell/phone within reach;with chair alarm set     Time: 1349-1401 PT  Time Calculation (min) (ACUTE ONLY): 12 min  Charges:  $Gait Training: 8-22 mins                    G Codes:  Functional Assessment Tool Used: clinical judgement Functional Limitation: Mobility: Walking and moving around Mobility: Walking and Moving Around Current Status [phone]): At least 20 percent but less than 40 percent impaired, limited or restricted Mobility: Walking and Moving Around Goal Status [phone]): At least 1 percent but less than 20 percent impaired, limited or restricted   Weston Anna, MPT Pager: [phone]

## 2016-09-30 NOTE — Progress Notes (Signed)
CSW consulted due to hip fx protocol. PN reviewed. Surgery is not recommended. Pt has been admitted under observation status. PT eval is pending. Medicare will not cover costs of SNF, if needed, due to pt's observation status. CSW is available to assist with pvt pay SNF placement if needed. CSW is available to assist with d/c planning, if SNF needed.  Werner Lean LCSW [phone]

## 2016-09-30 NOTE — Progress Notes (Signed)
I discussed with the patient that his left hip greater trochanter fracture is not indicated for surgery and we need this to heal on its own with time.  His precautions are for 50% weight bearing and no active AB duction with Pt.  [name]

## 2016-09-30 NOTE — Care Management Note (Signed)
Case Management Note  Patient Details  Name: Andrew Ramos MRN: [id number] Date of Birth: 15-Oct-1931  Subjective/Objective: left hip pain after fall                    Action/Plan:   Expected Discharge Date:   (UNKNOWN)               Expected Discharge Plan:     In-House Referral:     Discharge planning Services     Post Acute Care Choice:    Choice offered to:     DME Arranged:    DME Agency:     HH Arranged: HHPT   HH Agency:   [facility]  Status of Service:     If discussed at [employer], dates discussed:    Additional CommentsPurcell Mouton, RN 09/30/2016, 1:34 PM

## 2016-09-30 NOTE — Evaluation (Signed)
Occupational Therapy Evaluation Patient Details Name: Andrew Ramos MRN: [id number] DOB: 10/04/1931 Today's Date: 09/30/2016    History of Present Illness This 83 year old man fell from bed and sustained a L periprosthetic fx.  Orthopedics felt this fx is stable and does not need surgical intervention.  PMH:  depression, RA   Clinical Impression   Pt was admitted for the above injury. At baseline, he is independent with ADLs/IADLs.  His wife will not be able to physically assist him at home, and he may need support for meals, IADLs.  Goals for basic adls are for supervision level.  Pt mostly needs min A at this time; he may need 3:1 to be more independent with toilet transfer.      Follow Up Recommendations  Home health OT;Supervision - Intermittent    Equipment Recommendations  3 in 1 bedside commode (if pt doesn't have)    Recommendations for Other Services       Precautions / Restrictions Precautions Precautions: Fall Precaution Comments: NO ABDUCTION Restrictions Weight Bearing Restrictions: Yes LLE Weight Bearing: Partial weight bearing LLE Partial Weight Bearing Percentage or Pounds: 50%      Mobility Bed Mobility Overal bed mobility: Needs Assistance Bed Mobility: Supine to Sit     Supine to sit: Min assist     General bed mobility comments: assist for LLE  Transfers Overall transfer level: Needs assistance Equipment used: Rolling walker (2 wheeled) Transfers: Sit to/from Stand Sit to Stand: Min assist;Mod assist         General transfer comment: min A from bed; min/mod from high commode    Balance                                            ADL Overall ADL's : Needs assistance/impaired     Grooming: Wash/dry hands;Supervision/safety;Standing   Upper Body Bathing: Set up;Sitting   Lower Body Bathing: Minimal assistance;Sit to/from stand   Upper Body Dressing : Set up;Sitting   Lower Body Dressing: Minimal  assistance;Sit to/from stand   Toilet Transfer: Minimal assistance;Moderate assistance;Comfort height toilet;RW;Grab bars   Toileting- Clothing Manipulation and Hygiene: Min guard;Sit to/from stand;Sitting/lateral lean         General ADL Comments: pt needed min/mod A to get up from higher commode.  States his may be a little higher.  A 3;1 over the commode would make this easier for pt.  He was able to follow PWB when ambulating     Vision     Perception     Praxis      Pertinent Vitals/Pain Pain Assessment: Faces Faces Pain Scale: Hurts a little bit Pain Location: L hip Pain Intervention(s): Limited activity within patient's tolerance;Monitored during session;Premedicated before session;Repositioned     Hand Dominance     Extremity/Trunk Assessment Upper Extremity Assessment Upper Extremity Assessment: Overall WFL for tasks assessed           Communication Communication Communication: HOH (has hearing aides)   Cognition Arousal/Alertness: Awake/alert Behavior During Therapy: WFL for tasks assessed/performed Overall Cognitive Status: Within Functional Limits for tasks assessed                     General Comments       Exercises       Shoulder Instructions      Home Living Family/patient expects to be discharged  to:: Private residence Living Arrangements: Spouse/significant other Available Help at Discharge: Family Type of Home: House       Home Layout: One level     Bathroom Shower/Tub: Tub/shower unit Shower/tub characteristics: Architectural technologist: Handicapped height                Prior Functioning/Environment Level of Independence: Independent        Comments: wife has sarcoidosis; pt does meals        OT Problem List: Decreased strength;Decreased activity tolerance;Decreased knowledge of use of DME or AE;Pain   OT Treatment/Interventions: Self-care/ADL training;DME and/or AE instruction;Patient/family education     OT Goals(Current goals can be found in the care plan section) Acute Rehab OT Goals Patient Stated Goal: home OT Goal Formulation: With patient Time For Goal Achievement: 10/07/16 Potential to Achieve Goals: Good ADL Goals Pt Will Perform Lower Body Bathing: with supervision;sit to/from stand Pt Will Perform Lower Body Dressing: with supervision;sit to/from stand Pt Will Transfer to Toilet: with supervision;ambulating;bedside commode Pt Will Perform Tub/Shower Transfer: shower seat;Shower transfer;with min guard assist Additional ADL Goal #1: pt will perform bed mobility with AE as needed at supervision level following no Abduction precaution  OT Frequency: Min 2X/week   Barriers to D/C:            Co-evaluation PT/OT/SLP Co-Evaluation/Treatment: Yes Reason for Co-Treatment: For patient/therapist safety PT goals addressed during session: Mobility/safety with mobility OT goals addressed during session: ADL's and self-care      End of Session    Activity Tolerance: Patient tolerated treatment well Patient left: in chair;with call bell/phone within reach;with chair alarm set   Time: [id number] OT Time Calculation (min): 30 min Charges:  OT General Charges $OT Visit: 1 Procedure OT Evaluation $OT Eval Low Complexity: 1 Procedure G-Codes: OT G-codes **NOT FOR INPATIENT CLASS** Functional Assessment Tool Used: clinical observation and judgment Functional Limitation: Self care Self Care Current Status [id number]): At least 20 percent but less than 40 percent impaired, limited or restricted Self Care Goal Status [id number]): At least 1 percent but less than 20 percent impaired, limited or restricted  [facility] 09/30/2016, 11:55 AM Lesle Chris, OTR/L [phone] 09/30/2016

## 2016-09-30 NOTE — Progress Notes (Signed)
Initial Nutrition Assessment  DOCUMENTATION CODES:   Not applicable  INTERVENTION:   Ensure Enlive po BID, each supplement provides 350 kcal and 20 grams of protein  MVI  NUTRITION DIAGNOSIS:   Increased nutrient needs related to broken hip as evidenced by increased estimated needs from protein.  GOAL:   Patient will meet greater than or equal to 90% of their needs  MONITOR:   PO intake, Supplement acceptance  REASON FOR ASSESSMENT:   Consult Assessment of nutrition requirement/status  ASSESSMENT:   83 y.o. male with PMH significant for HTN, HLD, depression, osteoporosis, RA, left knee replacement and bilateral hip replacement; presented to ED with pain on his left hip after falling out of bed. Found to have left periprosthetic hip fracture.   Met with pt in room today. Pt in good spirits, making jokes, and sitting up in chair. Pt reports good appetite pta and currently eating 100% meals. Per chart, pt wt is stable for the past year. Pt hip fracture not surgical at this time. Will order Ensure and MVI.   Medications reviewed and include: aspirin, folic acid, heparin, hydrocodone   Labs reviewed: Ca 8.2(L)  Nutrition-Focused physical exam completed. Findings are no fat depletion, no muscle depletion, and no edema.   Diet Order:  Diet Heart Room service appropriate? Yes; Fluid consistency: Thin  Skin:  Reviewed, no issues  Last BM:  1/10  Height:   Ht Readings from Last 1 Encounters:  09/29/16 6' (1.829 m)    Weight:   Wt Readings from Last 1 Encounters:  09/29/16 218 lb (98.9 kg)    Ideal Body Weight:  80.9 kg  BMI:  Body mass index is 29.57 kg/m.  Estimated Nutritional Needs:   Kcal:  2000-2400kcal/day   Protein:  109-129g/day   Fluid:  >2L/day   EDUCATION NEEDS:   No education needs identified at this time  Koleen Distance, RD, LDN Pager #[phone]

## 2016-09-30 NOTE — Evaluation (Signed)
Physical Therapy Evaluation Patient Details Name: Andrew Ramos MRN: [id number] DOB: 04-Feb-1932 Today's Date: 09/30/2016   History of Present Illness  83 year old male admitted with L hip periprosthetic greater trochanteric fx after falling from bed at home.  Orthopedics consulted-nonoperative management. PMH:  depression, RA, bil THA, prostate cancer, Hep.   Clinical Impression  On eval, pt required Min-Mod assist for mobility. He walked ~25 feet with a RW. Tolerable pain with activity. Will continue to follow and progress activity as able. Discussed d/c plan-pt plans to return home.     Follow Up Recommendations Home health PT;Supervision - Intermittent    Equipment Recommendations  None recommended by PT    Recommendations for Other Services       Precautions / Restrictions Precautions Precautions: Fall Precaution Comments: NO ABDUCTION Restrictions Weight Bearing Restrictions: Yes LLE Weight Bearing: Partial weight bearing LLE Partial Weight Bearing Percentage or Pounds: 50%      Mobility  Bed Mobility Overal bed mobility: Needs Assistance Bed Mobility: Supine to Sit     Supine to sit: Min assist;HOB elevated     General bed mobility comments: assist for LLE. Educated on avoiding hip abduction.   Transfers Overall transfer level: Needs assistance Equipment used: Rolling walker (2 wheeled) Transfers: Sit to/from Stand Sit to Stand: Min assist;Mod assist;From elevated surface         General transfer comment: min A from bed; min/mod from commode. VCs safety, technique, hand/LE placement.   Ambulation/Gait Ambulation/Gait assistance: Min assist Ambulation Distance (Feet): 25 Feet Assistive device: Rolling walker (2 wheeled) Gait Pattern/deviations: Step-to pattern     General Gait Details: Intermittent assist to stabilize. Cues for safety, pacing, adherence to PWB status.   Stairs            Wheelchair Mobility    Modified Rankin (Stroke  Patients Only)       Balance Overall balance assessment: Needs assistance;History of Falls         Standing balance support: Bilateral upper extremity supported Standing balance-Leahy Scale: Poor                               Pertinent Vitals/Pain Pain Assessment: Faces Faces Pain Scale: Hurts little more Pain Location: L hip with movement Pain Intervention(s): Monitored during session;Repositioned;Limited activity within patient's tolerance    Home Living Family/patient expects to be discharged to:: Private residence Living Arrangements: Spouse/significant other Available Help at Discharge: Family Type of Home: House Home Access: Stairs to enter Entrance Stairs-Rails: None Technical brewer of Steps: 1 Home Layout: One level        Prior Function Level of Independence: Independent         Comments: wife has sarcoidosis; pt does meals     Hand Dominance        Extremity/Trunk Assessment   Upper Extremity Assessment Upper Extremity Assessment: Defer to OT evaluation    Lower Extremity Assessment Lower Extremity Assessment: Generalized weakness;LLE deficits/detail LLE Deficits / Details: did not MMT. Swelling noted throughout L LE with a pocket just above outside of knee cap    Cervical / Trunk Assessment Cervical / Trunk Assessment: Normal  Communication   Communication: HOH (has hearing aides)  Cognition Arousal/Alertness: Awake/alert Behavior During Therapy: WFL for tasks assessed/performed Overall Cognitive Status: Within Functional Limits for tasks assessed  General Comments      Exercises     Assessment/Plan    PT Assessment Patient needs continued PT services  PT Problem List Decreased strength;Decreased mobility;Decreased range of motion;Decreased activity tolerance;Decreased balance;Decreased knowledge of use of DME;Pain;Decreased knowledge of precautions          PT Treatment  Interventions DME instruction;Therapeutic activities;Gait training;Therapeutic exercise;Patient/family education;Stair training;Balance training;Functional mobility training    PT Goals (Current goals can be found in the Care Plan section)  Acute Rehab PT Goals Patient Stated Goal: home PT Goal Formulation: With patient Time For Goal Achievement: 10/14/16 Potential to Achieve Goals: Good    Frequency Min 4X/week   Barriers to discharge        Co-evaluation   Reason for Co-Treatment: For patient/therapist safety PT goals addressed during session: Mobility/safety with mobility OT goals addressed during session: ADL's and self-care       End of Session Equipment Utilized During Treatment: Gait belt Activity Tolerance: Patient tolerated treatment well Patient left: in chair;with call bell/phone within reach;with chair alarm set      Functional Assessment Tool Used: clinical judgement Functional Limitation: Mobility: Walking and moving around Mobility: Walking and Moving Around Current Status [phone]): At least 20 percent but less than 40 percent impaired, limited or restricted Mobility: Walking and Moving Around Goal Status [phone]): At least 1 percent but less than 20 percent impaired, limited or restricted    Time: [id number] PT Time Calculation (min) (ACUTE ONLY): 29 min   Charges:   PT Evaluation $PT Eval Low Complexity: 1 Procedure     PT G Codes:   PT G-Codes **NOT FOR INPATIENT CLASS** Functional Assessment Tool Used: clinical judgement Functional Limitation: Mobility: Walking and moving around Mobility: Walking and Moving Around Current Status [id number]): At least 20 percent but less than 40 percent impaired, limited or restricted Mobility: Walking and Moving Around Goal Status [phone]): At least 1 percent but less than 20 percent impaired, limited or restricted    Weston Anna, MPT Pager: [phone]

## 2016-09-30 NOTE — Progress Notes (Signed)
PROGRESS NOTE    Andrew Ramos  [id number] DOB: December 16, 1931 DOA: 09/29/2016 PCP: Ann Held, DO    Brief Narrative:   Andrew Ramos is a 83 y.o. male with PMH significant for HTN, HLD, depression, osteoporosis, RA, left knee replacement and bilateral hip replacement; presented to ED with pain on his left hip after falling out of bed. Patient reported that he rolled out of bed and landed on his left hip. He has been experiencing a lot of pain and difficulty with weight bearing. No CP, no SOB, no fever, no nausea, no vomiting, no abd pain, no dysuria.   ED Course: patient with multiple x-rays to assess for any fractures. CT head w/o acute abnormalities. Found to have left periprosthetic hip fracture. Orthopedic service consulted and [facility] called to admit as per hip protocol.   Assessment & Plan:   Principal Problem:   Closed left hip fracture (HCC) Active Problems:   Essential hypertension   PROSTATE CANCER, HX OF  #1 left hip area of perioprosthetic greater trochanteric fracture with minimal displacement Secondary to mechanical fall. Patient seen in consultation by orthopedics were recommending nonoperative treatment. PT for 50% weightbearing and no active abduction of the left hip training. Close outpatient follow-up in 2 weeks per orthopedics. Continue pain management. Supportive care.  #2 hypertension Stable. Lisinopril and HCTZ held this morning.  #3 mild dementia/depression Continue Aricept. Effexor.  #4 hyperlipidemia Continue statin.  #5 history of prostate cancer with BPH Stable. Continue Terazosin.  #6 history of osteoporosis and rheumatoid arthritis Fosamax and methotrexate on hold while inpatient. Will be resumed on outpatient.   DVT prophylaxis: Heparin Code Status: Full Family Communication: Updated patient. No family at bedside. Disposition Plan: Home with home health hopefully tomorrow.   Consultants:   Orthopedics: Dr. Stann Mainland  09/29/2016  Procedures:   Plain films of the left femur 09/29/2016  CT left hip 09/29/2016  CT head 09/29/2016  Chest x-ray 09/29/2016   Plain films of the left hip and pelvis 09/29/2016  Plain films of the left knee 09/29/2016  Antimicrobials:   None   Subjective: Patient denies any chest pain. No shortness of breath. Patient states he's changed his mind and is in agreement with surgery that he states was offered to him by orthopedics on admission. Patient states he is not going to eat his lunch in anticipation of surgery. Patient did state he ambulated in the hallway with physical therapy.  Objective: Vitals:   09/30/16 0030 09/30/16 0210 09/30/16 0404 09/30/16 0443  BP:    (!) 125/56  Pulse: 81 84 78 87  Resp: 20 18 18 18   Temp:    98.9 F (37.2 C)  TempSrc:    Oral  SpO2: 96% 96% 95% 96%  Weight:      Height:        Intake/Output Summary (Last 24 hours) at 09/30/16 1333 Last data filed at 09/30/16 1000  Gross per 24 hour  Intake          1115.82 ml  Output              300 ml  Net           815.82 ml   Filed Weights   09/29/16 2140  Weight: 98.9 kg (218 lb)    Examination:  General exam: Appears calm and comfortable.  Respiratory system: Clear to auscultation. Respiratory effort normal. Cardiovascular system: S1 & S2 heard, RRR. No JVD, murmurs, rubs, gallops or clicks. No  pedal edema. Gastrointestinal system: Abdomen is nondistended, soft and nontender. No organomegaly or masses felt. Normal bowel sounds heard. Central nervous system: Alert and oriented. No focal neurological deficits. Extremities: Symmetric 5 x 5 power. Skin: No rashes, lesions or ulcers Psychiatry: Judgement and insight appear normal. Mood & affect appropriate.     Data Reviewed: I have personally reviewed following labs and imaging studies  CBC:  Recent Labs Lab 09/29/16 1222 09/29/16 2041 09/30/16 0427  WBC 6.5 4.9 4.4  NEUTROABS 4.8  --   --   HGB 12.1* 10.7* 10.2*   HCT 36.2* 31.8* 30.6*  MCV 96.3 96.4 96.5  PLT 191 182 [id number]   Basic Metabolic Panel:  Recent Labs Lab 09/29/16 1222 09/29/16 2041 09/30/16 0427  NA 141  --  140  K 3.9  --  3.9  CL 108  --  109  CO2 27  --  26  GLUCOSE 118*  --  129*  BUN 20  --  20  CREATININE 0.92 0.74 0.81  CALCIUM 9.0  --  8.2*   GFR: Estimated Creatinine Clearance: 85.6 mL/min (by C-G formula based on SCr of 0.81 mg/dL). Liver Function Tests: No results for input(s): AST, ALT, ALKPHOS, BILITOT, PROT, ALBUMIN in the last 168 hours. No results for input(s): LIPASE, AMYLASE in the last 168 hours. No results for input(s): AMMONIA in the last 168 hours. Coagulation Profile:  Recent Labs Lab 09/29/16 1222  INR 1.06   Cardiac Enzymes: No results for input(s): CKTOTAL, CKMB, CKMBINDEX, TROPONINI in the last 168 hours. BNP (last 3 results) No results for input(s): PROBNP in the last 8760 hours. HbA1C: No results for input(s): HGBA1C in the last 72 hours. CBG: No results for input(s): GLUCAP in the last 168 hours. Lipid Profile: No results for input(s): CHOL, HDL, LDLCALC, TRIG, CHOLHDL, LDLDIRECT in the last 72 hours. Thyroid Function Tests: No results for input(s): TSH, T4TOTAL, FREET4, T3FREE, THYROIDAB in the last 72 hours. Anemia Panel: No results for input(s): VITAMINB12, FOLATE, FERRITIN, TIBC, IRON, RETICCTPCT in the last 72 hours. Sepsis Labs: No results for input(s): PROCALCITON, LATICACIDVEN in the last 168 hours.  No results found for this or any previous visit (from the past 240 hour(s)).       Radiology Studies: Dg Chest 1 View  Result Date: 09/29/2016 CLINICAL DATA:  [place] out of bed this morning on left side EXAM: CHEST 1 VIEW COMPARISON:  10/08/2013 FINDINGS: Cardiomediastinal silhouette is stable. No infiltrate or pulmonary edema. Mild atherosclerotic calcifications of thoracic aorta. There is no pneumothorax. IMPRESSION: No active disease. Electronically Signed   By: Lahoma Crocker  M.D.   On: 09/29/2016 12:01   Ct Head Wo Contrast  Result Date: 09/29/2016 CLINICAL DATA:  Fall, left hip pain. EXAM: CT HEAD WITHOUT CONTRAST TECHNIQUE: Contiguous axial images were obtained from the base of the skull through the vertex without intravenous contrast. COMPARISON:  None. FINDINGS: Brain: No acute intracranial abnormality. Specifically, no hemorrhage, hydrocephalus, mass lesion, acute infarction, or significant intracranial injury. Vascular: No hyperdense vessel or unexpected calcification. Skull: No acute calvarial abnormality. Sinuses/Orbits: Mucosal thickening throughout the right paranasal sinuses. Mastoid air cells are clear. Orbital soft tissues unremarkable. Other: None IMPRESSION: No acute intracranial abnormality. Chronic sinusitis in the right paranasal sinuses. Electronically Signed   By: Rolm Baptise M.D.   On: 09/29/2016 13:40   Ct Hip Left Wo Contrast  Result Date: 09/29/2016 CLINICAL DATA:  Left hip pain, fall. EXAM: CT OF THE LEFT HIP WITHOUT CONTRAST TECHNIQUE:  Multidetector CT imaging of the left hip was performed according to the standard protocol. Multiplanar CT image reconstructions were also generated. COMPARISON:  09/29/2016 FINDINGS: Bones/Joint/Cartilage Acute greater trochanteric and periprosthetic fracture on the left extending along the proximal 5.5 cm of the stem of the femoral component of the left total hip prosthesis. Mild comminution. Abnormal transverse sclerosis in the sacrum at approximately the S3 level suspicious for a sacral insufficiency fracture. Spurring along the lesser trochanter appears chronic. Ligaments Suboptimally assessed by CT. Muscles and Tendons Hematoma in the proximal vastus musculature. Soft tissues Fluid in the trochanteric bursa. Common femoral artery atherosclerotic calcification. Seed implants in the prostate gland. IMPRESSION: 1. Acute mildly comminuted greater trochanteric and periprosthetic fracture on the left, extending along the  proximal 5.5 cm of the stem of the femoral component of the hip implant. Associated hematoma in the proximal vastus musculature and fluid in the trochanteric bursa. 2. Sclerotic band transversely in the sacrum at about the S3 level suspicious for sacral insufficiency fracture. This is either subacute or chronic. Electronically Signed   By: Van Clines M.D.   On: 09/29/2016 13:51   Dg Knee Complete 4 Views Left  Result Date: 09/29/2016 CLINICAL DATA:  [place] from bed with left knee pain EXAM: LEFT KNEE - COMPLETE 4+ VIEW COMPARISON:  None. FINDINGS: The femoral and tibial components of the left total knee replacement are in good position. No complicating features are seen. No fracture is noted and there is no evidence of joint effusion. IMPRESSION: Left total knee replacement components in good position. No complicating features. Electronically Signed   By: Ivar Drape M.D.   On: 09/29/2016 10:51   Dg Hip Unilat With Pelvis 2-3 Views Left  Result Date: 09/29/2016 CLINICAL DATA:  [place] from bed injuring the left side with left hip pain and knee pain EXAM: DG HIP (WITH OR WITHOUT PELVIS) 2-3V LEFT COMPARISON:  None. FINDINGS: Bilateral total hip replacements are noted. There does appear to be an acute fracture involving the greater trochanter of the left femur without displacement. Multiple prostate implant seeds are present. The pelvic rami are intact. The SI joints are corticated. There are degenerative changes in the lower lumbar spine. IMPRESSION: 1. Fracture of the proximal left femur involving the left greater trochanter in this patient who was undergone bilateral total hip replacements. 2. Degenerative change of the lower lumbar spine. Electronically Signed   By: Ivar Drape M.D.   On: 09/29/2016 10:50   Dg Femur Min 2 Views Left  Result Date: 09/29/2016 CLINICAL DATA:  [place] out of bed this morning.  Left leg pain. EXAM: LEFT FEMUR 2 VIEWS COMPARISON:  Hip and knee films, same date. FINDINGS: The  hip joint is not included. A prior pelvis film was performed. No femoral shaft fractures are identified. The knee components are intact. IMPRESSION: No femoral shaft fracture Electronically Signed   By: Marijo Sanes M.D.   On: 09/29/2016 12:02        Scheduled Meds: . aspirin EC  81 mg Oral Daily  . donepezil  5 mg Oral QHS  . fluticasone  2 spray Each Nare Daily  . folic acid  1 mg Oral Daily  . heparin  5,000 Units Subcutaneous Q8H  . lisinopril  10 mg Oral Daily   And  . hydrochlorothiazide  12.5 mg Oral Daily  . rosuvastatin  20 mg Oral Daily  . terazosin  1 mg Oral QHS  . venlafaxine XR  75 mg Oral Q breakfast  Continuous Infusions: . sodium chloride 50 mL/hr at 09/29/16 2252     LOS: 0 days    Time spent: 81 mins    Marguerita Stapp, MD [facility] Pager [phone]  If 7PM-7AM, please contact night-coverage [email or web address] Password [email or web address] 09/30/2016, 1:33 PM

## 2016-09-30 NOTE — Progress Notes (Signed)
Patient is refusing to eat because he is convinced that he is having surgery. Patient was told he is NOT having surgery per ortho consults. Family told patient it is not the surgeon's decision, it's the patients. Drs. Swinteck and [employer].

## 2016-10-01 ENCOUNTER — Other Ambulatory Visit: Payer: Medicare Other

## 2016-10-01 MED ORDER — HYDROCODONE-ACETAMINOPHEN 5-325 MG PO TABS: 1.0000 | ORAL_TABLET | Freq: Four times a day (QID) | ORAL | 0 refills | Status: DC | PRN

## 2016-10-01 MED ORDER — METHOCARBAMOL 500 MG PO TABS: 500.0000 mg | ORAL_TABLET | Freq: Four times a day (QID) | ORAL | 0 refills | Status: DC | PRN

## 2016-10-01 MED ORDER — ENSURE ENLIVE PO LIQD: 237.0000 mL | Freq: Two times a day (BID) | ORAL | 0 refills | Status: DC

## 2016-10-01 NOTE — Care Management Note (Signed)
Case Management Note  Patient Details  Name: Andrew Ramos MRN: [id number] Date of Birth: 05/11/1932  Subjective/Objective:                  left hip after falling  Action/Plan: Discharge planning Expected Discharge Date:  10/01/16               Expected Discharge Plan:  [place]  In-House Referral:     Discharge planning Services  CM Consult  Post Acute Care Choice:  Home Health Choice offered to:  Patient  DME Arranged:  N/A DME Agency:  NA  HH Arranged:  PT, OT HH Agency:  [facility] ([facility]  Status of Service:  Completed, signed off  If discussed at [employer], dates discussed:    Additional Comments: CM spoke with pt to confirm [facility] as his choice for HHPT/OT; pt confirms.  Pt also states he does not need a 3n1 and has a rolling walker at home. Referal called to [place].  No other Cm needs were communicated. Dellie Catholic, RN 10/01/2016, 12:50 PM

## 2016-10-01 NOTE — Discharge Summary (Signed)
Physician Discharge Summary  Andrew Ramos [id number] DOB: [birth year] DOA: 09/29/2016  PCP: Ann Held, DO  Admit date: 09/29/2016 Discharge date: 10/01/2016  Time spent: 65 minutes  Recommendations for Outpatient Follow-up:  1. Follow up with Dr Lyla Glassing, Orthopedics in 2 weeks.   Discharge Diagnoses:  Principal Problem:   Closed left hip fracture ([place]) Active Problems:   Essential hypertension   PROSTATE CANCER, HX OF   Discharge Condition: stable.  Diet recommendation: Regular  Filed Weights   09/29/16 2140  Weight: 98.9 kg (218 lb)    History of present illness:  Per Dr. Lottie Mussel Polzin is a 83 y.o. male with PMH significant for HTN, HLD, depression, osteoporosis, RA, left knee replacement and bilateral hip replacement; presented to ED with pain on his left hip after falling out of bed. Patient reported that he rolled out of bed and landed on his left hip. He had been experiencing a lot of pain and difficulty with weight bearing. No CP, no SOB, no fever, no nausea, no vomiting, no abd pain, no dysuria.   ED Course: patient with multiple x-rays to assess for any fractures. CT head w/o acute abnormalities. Found to have left periprosthetic hip fracture. Orthopedic service consulted and [facility] called to admit as per hip protocol.   Hospital Course:  #1 left hip area of perioprosthetic greater trochanteric fracture with minimal displacement Secondary to mechanical fall. Patient seen in consultation by orthopedics were recommending nonoperative treatment. PT for 50% weightbearing and no active abduction of the left hip training. Patient was followed by orthopedics and PT during the hospitalization. Patient will be discharged home with home health and close outpatient follow-up in 2 weeks per orthopedics. Outpatient follow up.  #2 hypertension Stable. Lisinopril and HCTZ held during the hospitalization.   #3 mild dementia/depression Continued on home  regimen Aricept. Effexor.  #4 hyperlipidemia Continued on statin.  #5 history of prostate cancer with BPH Stable. Continued on home regimen of Terazosin.  #6 history of osteoporosis and rheumatoid arthritis Fosamax and methotrexate on hold while inpatient. Will be resumed on outpatient.   Procedures:  Plain films of the left femur 09/29/2016  CT left hip 09/29/2016  CT head 09/29/2016  Chest x-ray 09/29/2016   Plain films of the left hip and pelvis 09/29/2016  Plain films of the left knee 09/29/2016   Consultations:  Orthopedics: Dr. Stann Mainland 09/29/2016   Discharge Exam: Vitals:   10/01/16 0447 10/01/16 1344  BP: 125/60 113/62  Pulse: 84 71  Resp: 16 15  Temp: 98 F (36.7 C) 98 F (36.7 C)    General: NAD Cardiovascular: RRR Respiratory: CTAB  Discharge Instructions   Discharge Instructions    Diet general    Complete by:  As directed    Increase activity slowly    Complete by:  As directed      Current Discharge Medication List    START taking these medications   Details  feeding supplement, ENSURE ENLIVE, (ENSURE ENLIVE) LIQD Take 237 mLs by mouth 2 (two) times daily between meals. Qty: 237 mL, Refills: 0    HYDROcodone-acetaminophen (NORCO/VICODIN) 5-325 MG tablet Take 1-2 tablets by mouth every 6 (six) hours as needed for moderate pain. Qty: 20 tablet, Refills: 0    methocarbamol (ROBAXIN) 500 MG tablet Take 1 tablet (500 mg total) by mouth every 6 (six) hours as needed for muscle spasms. Qty: 20 tablet, Refills: 0      CONTINUE these medications which have NOT CHANGED  Details  alendronate (FOSAMAX) 70 MG tablet Take 70 mg by mouth every 7 (seven) days. Take with a full glass of water on an empty stomach every Friday    amoxicillin (AMOXIL) 500 MG tablet Take 2,000 mg by mouth once as needed (dental procedures).    aspirin 81 MG tablet Take 1 tablet (81 mg total) by mouth daily. Qty: 30 tablet   Associated Diagnoses:  Hyperlipidemia    Calcium Carbonate-Vitamin D (CALCIUM 600 + D PO) Take 1 tablet by mouth every morning.     Cholecalciferol (VITAMIN D3) 1000 UNITS tablet Take 1,000 Units by mouth every morning.     donepezil (ARICEPT) 5 MG tablet TAKE 1 TABLET(5 MG) BY MOUTH AT BEDTIME Qty: 90 tablet, Refills: 2   Associated Diagnoses: Memory loss    fluticasone (FLONASE) 50 MCG/ACT nasal spray USE 2 SPRAYS IN EACH NOSTRIL DAILY Qty: 16 g, Refills: 1    folic acid (FOLVITE) 1 MG tablet Take 1 mg by mouth daily.    glucose blood (FREESTYLE LITE) test strip Check blood sugar once daily. Dx 250.00 Qty: 100 each, Refills: 12    lisinopril-hydrochlorothiazide (PRINZIDE,ZESTORETIC) 10-12.5 MG tablet TAKE 1 TABLET DAILY Qty: 90 tablet, Refills: 1    methotrexate (RHEUMATREX) 2.5 MG tablet Take 12.5 mg by mouth every Wednesday.     Multiple Vitamins-Minerals (CENTRUM SILVER ULTRA MENS) TABS Take 1 tablet by mouth every evening.     polyethylene glycol powder (GLYCOLAX/MIRALAX) powder Take 17 g by mouth daily as needed for moderate constipation. 1 scoop daily     rosuvastatin (CRESTOR) 20 MG tablet Take 1 tablet (20 mg total) by mouth daily.   Associated Diagnoses: Hyperlipidemia    terazosin (HYTRIN) 1 MG capsule TAKE 1 CAPSULE AT BEDTIME Qty: 90 capsule, Refills: 1    venlafaxine XR (EFFEXOR-XR) 75 MG 24 hr capsule TAKE 1 CAPSULE DAILY WITH BREAKFAST Qty: 90 capsule, Refills: 0   Associated Diagnoses: Depression       No Known Allergies Follow-up Information    Swinteck, Horald Pollen, MD. Schedule an appointment as soon as possible for a visit in 2 week(s).   Specialty:  Orthopedic Surgery Contact information: [address] [place] [address] [phone]        [facility] Follow up.   Specialty:  Home Health Services Why:  home health physical and occupational therapy Contact information: [address] [place] [address] [phone]             The results of significant diagnostics from this hospitalization (including imaging, microbiology, ancillary and laboratory) are listed below for reference.    Significant Diagnostic Studies: Dg Chest 1 View  Result Date: 09/29/2016 CLINICAL DATA:  [place] out of bed this morning on left side EXAM: CHEST 1 VIEW COMPARISON:  10/08/2013 FINDINGS: Cardiomediastinal silhouette is stable. No infiltrate or pulmonary edema. Mild atherosclerotic calcifications of thoracic aorta. There is no pneumothorax. IMPRESSION: No active disease. Electronically Signed   By: Lahoma Crocker M.D.   On: 09/29/2016 12:01   Ct Head Wo Contrast  Result Date: 09/29/2016 CLINICAL DATA:  Fall, left hip pain. EXAM: CT HEAD WITHOUT CONTRAST TECHNIQUE: Contiguous axial images were obtained from the base of the skull through the vertex without intravenous contrast. COMPARISON:  None. FINDINGS: Brain: No acute intracranial abnormality. Specifically, no hemorrhage, hydrocephalus, mass lesion, acute infarction, or significant intracranial injury. Vascular: No hyperdense vessel or unexpected calcification. Skull: No acute calvarial abnormality. Sinuses/Orbits: Mucosal thickening throughout the  right paranasal sinuses. Mastoid air cells are clear. Orbital soft tissues unremarkable. Other: None IMPRESSION: No acute intracranial abnormality. Chronic sinusitis in the right paranasal sinuses. Electronically Signed   By: Rolm Baptise M.D.   On: 09/29/2016 13:40   Ct Hip Left Wo Contrast  Result Date: 09/29/2016 CLINICAL DATA:  Left hip pain, fall. EXAM: CT OF THE LEFT HIP WITHOUT CONTRAST TECHNIQUE: Multidetector CT imaging of the left hip was performed according to the standard protocol. Multiplanar CT image reconstructions were also generated. COMPARISON:  09/29/2016 FINDINGS: Bones/Joint/Cartilage Acute greater trochanteric and periprosthetic fracture on the left extending along the proximal 5.5 cm of the stem of the femoral component of the  left total hip prosthesis. Mild comminution. Abnormal transverse sclerosis in the sacrum at approximately the S3 level suspicious for a sacral insufficiency fracture. Spurring along the lesser trochanter appears chronic. Ligaments Suboptimally assessed by CT. Muscles and Tendons Hematoma in the proximal vastus musculature. Soft tissues Fluid in the trochanteric bursa. Common femoral artery atherosclerotic calcification. Seed implants in the prostate gland. IMPRESSION: 1. Acute mildly comminuted greater trochanteric and periprosthetic fracture on the left, extending along the proximal 5.5 cm of the stem of the femoral component of the hip implant. Associated hematoma in the proximal vastus musculature and fluid in the trochanteric bursa. 2. Sclerotic band transversely in the sacrum at about the S3 level suspicious for sacral insufficiency fracture. This is either subacute or chronic. Electronically Signed   By: Van Clines M.D.   On: 09/29/2016 13:51   Dg Knee Complete 4 Views Left  Result Date: 09/29/2016 CLINICAL DATA:  [place] from bed with left knee pain EXAM: LEFT KNEE - COMPLETE 4+ VIEW COMPARISON:  None. FINDINGS: The femoral and tibial components of the left total knee replacement are in good position. No complicating features are seen. No fracture is noted and there is no evidence of joint effusion. IMPRESSION: Left total knee replacement components in good position. No complicating features. Electronically Signed   By: Ivar Drape M.D.   On: 09/29/2016 10:51   Dg Hip Unilat With Pelvis 2-3 Views Left  Result Date: 09/29/2016 CLINICAL DATA:  [place] from bed injuring the left side with left hip pain and knee pain EXAM: DG HIP (WITH OR WITHOUT PELVIS) 2-3V LEFT COMPARISON:  None. FINDINGS: Bilateral total hip replacements are noted. There does appear to be an acute fracture involving the greater trochanter of the left femur without displacement. Multiple prostate implant seeds are present. The pelvic  rami are intact. The SI joints are corticated. There are degenerative changes in the lower lumbar spine. IMPRESSION: 1. Fracture of the proximal left femur involving the left greater trochanter in this patient who was undergone bilateral total hip replacements. 2. Degenerative change of the lower lumbar spine. Electronically Signed   By: Ivar Drape M.D.   On: 09/29/2016 10:50   Dg Femur Min 2 Views Left  Result Date: 09/29/2016 CLINICAL DATA:  [place] out of bed this morning.  Left leg pain. EXAM: LEFT FEMUR 2 VIEWS COMPARISON:  Hip and knee films, same date. FINDINGS: The hip joint is not included. A prior pelvis film was performed. No femoral shaft fractures are identified. The knee components are intact. IMPRESSION: No femoral shaft fracture Electronically Signed   By: Marijo Sanes M.D.   On: 09/29/2016 12:02    Microbiology: No results found for this or any previous visit (from the past 240 hour(s)).   Labs: Basic Metabolic Panel:  Recent Labs Lab 09/29/16  1222 09/29/16 2041 09/30/16 0427  NA 141  --  140  K 3.9  --  3.9  CL 108  --  109  CO2 27  --  26  GLUCOSE 118*  --  129*  BUN 20  --  20  CREATININE 0.92 0.74 0.81  CALCIUM 9.0  --  8.2*   Liver Function Tests: No results for input(s): AST, ALT, ALKPHOS, BILITOT, PROT, ALBUMIN in the last 168 hours. No results for input(s): LIPASE, AMYLASE in the last 168 hours. No results for input(s): AMMONIA in the last 168 hours. CBC:  Recent Labs Lab 09/29/16 1222 09/29/16 2041 09/30/16 0427  WBC 6.5 4.9 4.4  NEUTROABS 4.8  --   --   HGB 12.1* 10.7* 10.2*  HCT 36.2* 31.8* 30.6*  MCV 96.3 96.4 96.5  PLT 191 182 167   Cardiac Enzymes: No results for input(s): CKTOTAL, CKMB, CKMBINDEX, TROPONINI in the last 168 hours. BNP: BNP (last 3 results) No results for input(s): BNP in the last 8760 hours.  ProBNP (last 3 results) No results for input(s): PROBNP in the last 8760 hours.  CBG: No results for input(s): GLUCAP in the  last 168 hours.     SignedIrine Seal MD.  [facility] 10/01/2016, 2:53 PM

## 2016-10-01 NOTE — Progress Notes (Signed)
Occupational Therapy Treatment Patient Details Name: BODHIN LOZEAU MRN: [id number] DOB: 30-Jun-1932 Today's Date: 10/01/2016    History of present illness 83 year old male admitted with L hip periprosthetic greater trochanteric fx after falling from bed at home.  Orthopedics consulted-nonoperative management. PMH:  depression, RA, bil THA, prostate cancer, Hep.    OT comments  RN reports that pt was confused last night.  He really needs 24/7 at home. Wife is limited with ability to assist and pt has 2 small dogs that must be taken out on leash.  Pt was able to recall precautions today, but he needs cues in context for no abduction.  He is doing a good job of [place].  Follow Up Recommendations  Home health OT;Supervision/Assistance - 24 hour (and HH aide vs SNF)    Equipment Recommendations  3 in 1 bedside commode    Recommendations for Other Services      Precautions / Restrictions Precautions Precautions: Fall Precaution Comments: NO ABDUCTION Restrictions Weight Bearing Restrictions: Yes LLE Weight Bearing: Partial weight bearing LLE Partial Weight Bearing Percentage or Pounds: 50%       Mobility Bed Mobility         Supine to sit: Supervision     General bed mobility comments: extra time  Transfers   Equipment used: Rolling walker (2 wheeled)   Sit to Stand: Min guard         General transfer comment: cues for UE/LE placement and min guard for safety    Balance                                   ADL           Upper Body Bathing: Set up;Sitting   Lower Body Bathing: Minimal assistance;Sit to/from stand   Upper Body Dressing : Set up;Sitting   Lower Body Dressing: Minimal assistance;Sit to/from stand   Toilet Transfer: Min guard;Stand-pivot;RW (to recliner)             General ADL Comments: performed ADL and transferred to recliner.  Pt able to state precautions this session, however, nursing reports he was confused last night.   He needs cues functionally to bring both legs up together.  Pt's son is out of town and will not be home until tomorrow, and he works.  Pt will need as much assistance as possible or consider SNF.  Pt also has 2 small dogs that he can't open the door and let out, so he may need someone to take them for him.      Vision                     Perception     Praxis      Cognition   Behavior During Therapy: WFL for tasks assessed/performed Overall Cognitive Status: Within Functional Limits for tasks assessed                       Extremity/Trunk Assessment               Exercises     Shoulder Instructions       General Comments      Pertinent Vitals/ Pain       Pain Assessment: 0-10 Faces Pain Scale: Hurts a little bit Pain Location: L hip Pain Descriptors / Indicators: Sore Pain Intervention(s): Limited activity within patient's tolerance;Monitored during session;Premedicated  before session;Repositioned  Home Living                                          Prior Functioning/Environment              Frequency           Progress Toward Goals  OT Goals(current goals can now be found in the care plan section)  Progress towards OT goals: Progressing toward goals     Plan Discharge plan needs to be updated    Co-evaluation                 End of Session     Activity Tolerance Patient tolerated treatment well   Patient Left in chair;with call bell/phone within reach;with chair alarm set   Nurse Communication          Time: [id number] OT Time Calculation (min): 26 min  Charges: OT General Charges $OT Visit: 1 Procedure OT Treatments $Self Care/Home Management : 23-37 mins  Gunter Conde 10/01/2016, 11:25 AM  Lesle Chris, OTR/L [phone] 10/01/2016

## 2016-10-01 NOTE — Progress Notes (Signed)
Physical Therapy Treatment Patient Details Name: Andrew Ramos MRN: [id number] DOB: 29-Jul-1932 Today's Date: 10/01/2016    History of Present Illness 83 year old male admitted with L hip periprosthetic greater trochanteric fx after falling from bed at home.  Orthopedics consulted-nonoperative management. PMH:  depression, RA, bil THA, prostate cancer, Hep.     PT Comments    Progressing with mobility. Pt was able to recall PWB and No hip Abd precautions on today. Intermittent Min assist for ambulation on today. Continue to recommend 24 hour supervision/assist for safety if pt returns home. Per pt, wife is not able to physically assist. May need to consider ST rehab however unsure if pt will agree to this.    Follow Up Recommendations  Home health PT;Supervision/Assistance - 24 hour (may possibly need SNF depending on progress. Home Health Aide could be beneficial if pt returns home)     Equipment Recommendations  None recommended by PT    Recommendations for Other Services       Precautions / Restrictions Precautions Precautions: Fall Precaution Comments: NO ABDUCTION L LE Restrictions Weight Bearing Restrictions: Yes LLE Weight Bearing: Partial weight bearing LLE Partial Weight Bearing Percentage or Pounds: 50%    Mobility  Bed Mobility         Supine to sit: Supervision     General bed mobility comments: oob in recliner  Transfers Overall transfer level: Needs assistance Equipment used: Rolling walker (2 wheeled) Transfers: Sit to/from Stand Sit to Stand: Min guard         General transfer comment: cues for UE/LE placement and min guard for safety  Ambulation/Gait Ambulation/Gait assistance: Min assist Ambulation Distance (Feet): 75 Feet Assistive device: Rolling walker (2 wheeled) Gait Pattern/deviations: Step-to pattern     General Gait Details: Intermittent assist to stabilize. Cues for safety, pacing, adherence to PWB status.    Stairs             Wheelchair Mobility    Modified Rankin (Stroke Patients Only)       Balance                                    Cognition Arousal/Alertness: Awake/alert Behavior During Therapy: WFL for tasks assessed/performed Overall Cognitive Status: Within Functional Limits for tasks assessed                      Exercises      General Comments        Pertinent Vitals/Pain Pain Assessment: 0-10 Pain Score: 4  Faces Pain Scale: Hurts a little bit Pain Location: L hip Pain Descriptors / Indicators: Sore Pain Intervention(s): Monitored during session;Repositioned    Home Living                      Prior Function            PT Goals (current goals can now be found in the care plan section) Progress towards PT goals: Progressing toward goals    Frequency    Min 4X/week      PT Plan Current plan remains appropriate    Co-evaluation             End of Session Equipment Utilized During Treatment: Gait belt Activity Tolerance: Patient tolerated treatment well Patient left: in chair;with call bell/phone within reach;with chair alarm set     Time: [id number] PT Time  Calculation (min) (ACUTE ONLY): 9 min  Charges:  $Gait Training: 8-22 mins                    G Codes:      Weston Anna, MPT Pager: [phone]

## 2016-10-04 DIAGNOSIS — M353 Polymyalgia rheumatica: Secondary | ICD-10-CM | POA: Diagnosis not present

## 2016-10-04 DIAGNOSIS — M810 Age-related osteoporosis without current pathological fracture: Secondary | ICD-10-CM | POA: Diagnosis not present

## 2016-10-04 DIAGNOSIS — M9702XD Periprosthetic fracture around internal prosthetic left hip joint, subsequent encounter: Secondary | ICD-10-CM | POA: Diagnosis not present

## 2016-10-04 DIAGNOSIS — I10 Essential (primary) hypertension: Secondary | ICD-10-CM | POA: Diagnosis not present

## 2016-10-04 DIAGNOSIS — S72112D Displaced fracture of greater trochanter of left femur, subsequent encounter for closed fracture with routine healing: Secondary | ICD-10-CM | POA: Diagnosis not present

## 2016-10-04 DIAGNOSIS — M069 Rheumatoid arthritis, unspecified: Secondary | ICD-10-CM | POA: Diagnosis not present

## 2016-10-07 DIAGNOSIS — M9702XD Periprosthetic fracture around internal prosthetic left hip joint, subsequent encounter: Secondary | ICD-10-CM | POA: Diagnosis not present

## 2016-10-07 DIAGNOSIS — M353 Polymyalgia rheumatica: Secondary | ICD-10-CM | POA: Diagnosis not present

## 2016-10-07 DIAGNOSIS — M810 Age-related osteoporosis without current pathological fracture: Secondary | ICD-10-CM | POA: Diagnosis not present

## 2016-10-07 DIAGNOSIS — M069 Rheumatoid arthritis, unspecified: Secondary | ICD-10-CM | POA: Diagnosis not present

## 2016-10-07 DIAGNOSIS — I10 Essential (primary) hypertension: Secondary | ICD-10-CM | POA: Diagnosis not present

## 2016-10-07 DIAGNOSIS — S72112D Displaced fracture of greater trochanter of left femur, subsequent encounter for closed fracture with routine healing: Secondary | ICD-10-CM | POA: Diagnosis not present

## 2016-10-10 ENCOUNTER — Telehealth: Payer: Self-pay | Admitting: Family Medicine

## 2016-10-11 ENCOUNTER — Telehealth: Payer: Self-pay | Admitting: Family Medicine

## 2016-10-11 NOTE — Telephone Encounter (Signed)
We would have had to see him in last 30 days for home health to come out---  Did hosp order home health when he was in hospital?  They can send social work out.

## 2016-10-11 NOTE — Telephone Encounter (Signed)
[id number] ° °

## 2016-10-11 NOTE — Telephone Encounter (Signed)
Patient's wife called to request a Education officer, museum for the patient. She states that he recently fell and is having a hard time getting around. She also states that she is sick and has a hard time taking care of him. She would like to know what steps she needs to take to get him a Education officer, museum to help him out. Please advise  Phone: [phone]

## 2016-10-12 DIAGNOSIS — M9702XD Periprosthetic fracture around internal prosthetic left hip joint, subsequent encounter: Secondary | ICD-10-CM | POA: Diagnosis not present

## 2016-10-12 DIAGNOSIS — S72112D Displaced fracture of greater trochanter of left femur, subsequent encounter for closed fracture with routine healing: Secondary | ICD-10-CM | POA: Diagnosis not present

## 2016-10-12 DIAGNOSIS — M353 Polymyalgia rheumatica: Secondary | ICD-10-CM | POA: Diagnosis not present

## 2016-10-12 DIAGNOSIS — M069 Rheumatoid arthritis, unspecified: Secondary | ICD-10-CM | POA: Diagnosis not present

## 2016-10-12 DIAGNOSIS — M810 Age-related osteoporosis without current pathological fracture: Secondary | ICD-10-CM | POA: Diagnosis not present

## 2016-10-12 DIAGNOSIS — I10 Essential (primary) hypertension: Secondary | ICD-10-CM | POA: Diagnosis not present

## 2016-10-12 NOTE — Telephone Encounter (Signed)
Follow-Ups    1 Schedule an appointment with [place], Horald Pollen, MD (Orthopedic Surgery) in 2 weeks (10/15/2016)  2 Follow up with [place] ([place]); home health physical and occupational therapy   These are the only 2 things that they ordered for him per patients wife.  They did not put in an order for social work.  [facility] has been out for PT and occupational therapy.  It is very hard to get him mobile.  They are suppose to teach him how to get in and out of a car but has not yet.  It will be very hard to get patient to office.    Is there any way to get someone out there for them.

## 2016-10-13 DIAGNOSIS — I10 Essential (primary) hypertension: Secondary | ICD-10-CM | POA: Diagnosis not present

## 2016-10-13 DIAGNOSIS — M353 Polymyalgia rheumatica: Secondary | ICD-10-CM | POA: Diagnosis not present

## 2016-10-13 DIAGNOSIS — M069 Rheumatoid arthritis, unspecified: Secondary | ICD-10-CM | POA: Diagnosis not present

## 2016-10-13 DIAGNOSIS — M9702XD Periprosthetic fracture around internal prosthetic left hip joint, subsequent encounter: Secondary | ICD-10-CM | POA: Diagnosis not present

## 2016-10-13 DIAGNOSIS — M810 Age-related osteoporosis without current pathological fracture: Secondary | ICD-10-CM | POA: Diagnosis not present

## 2016-10-13 DIAGNOSIS — S72112D Displaced fracture of greater trochanter of left femur, subsequent encounter for closed fracture with routine healing: Secondary | ICD-10-CM | POA: Diagnosis not present

## 2016-10-13 NOTE — Telephone Encounter (Signed)
If he has home health coming to the house--- they would be the ones to ask for social work ---

## 2016-10-13 NOTE — Telephone Encounter (Signed)
Patient wife notified and she will ask the home care.

## 2016-10-14 DIAGNOSIS — M9702XD Periprosthetic fracture around internal prosthetic left hip joint, subsequent encounter: Secondary | ICD-10-CM | POA: Diagnosis not present

## 2016-10-14 DIAGNOSIS — M353 Polymyalgia rheumatica: Secondary | ICD-10-CM | POA: Diagnosis not present

## 2016-10-14 DIAGNOSIS — S72112D Displaced fracture of greater trochanter of left femur, subsequent encounter for closed fracture with routine healing: Secondary | ICD-10-CM | POA: Diagnosis not present

## 2016-10-14 DIAGNOSIS — M810 Age-related osteoporosis without current pathological fracture: Secondary | ICD-10-CM | POA: Diagnosis not present

## 2016-10-14 DIAGNOSIS — M069 Rheumatoid arthritis, unspecified: Secondary | ICD-10-CM | POA: Diagnosis not present

## 2016-10-14 DIAGNOSIS — I10 Essential (primary) hypertension: Secondary | ICD-10-CM | POA: Diagnosis not present

## 2016-10-17 DIAGNOSIS — S72112D Displaced fracture of greater trochanter of left femur, subsequent encounter for closed fracture with routine healing: Secondary | ICD-10-CM | POA: Diagnosis not present

## 2016-10-17 DIAGNOSIS — M810 Age-related osteoporosis without current pathological fracture: Secondary | ICD-10-CM | POA: Diagnosis not present

## 2016-10-17 DIAGNOSIS — I10 Essential (primary) hypertension: Secondary | ICD-10-CM | POA: Diagnosis not present

## 2016-10-17 DIAGNOSIS — M9702XD Periprosthetic fracture around internal prosthetic left hip joint, subsequent encounter: Secondary | ICD-10-CM | POA: Diagnosis not present

## 2016-10-17 DIAGNOSIS — M069 Rheumatoid arthritis, unspecified: Secondary | ICD-10-CM | POA: Diagnosis not present

## 2016-10-17 DIAGNOSIS — M353 Polymyalgia rheumatica: Secondary | ICD-10-CM | POA: Diagnosis not present

## 2016-10-18 DIAGNOSIS — M069 Rheumatoid arthritis, unspecified: Secondary | ICD-10-CM | POA: Diagnosis not present

## 2016-10-18 DIAGNOSIS — M810 Age-related osteoporosis without current pathological fracture: Secondary | ICD-10-CM | POA: Diagnosis not present

## 2016-10-18 DIAGNOSIS — I10 Essential (primary) hypertension: Secondary | ICD-10-CM | POA: Diagnosis not present

## 2016-10-18 DIAGNOSIS — M9702XD Periprosthetic fracture around internal prosthetic left hip joint, subsequent encounter: Secondary | ICD-10-CM | POA: Diagnosis not present

## 2016-10-18 DIAGNOSIS — M353 Polymyalgia rheumatica: Secondary | ICD-10-CM | POA: Diagnosis not present

## 2016-10-18 DIAGNOSIS — S72112D Displaced fracture of greater trochanter of left femur, subsequent encounter for closed fracture with routine healing: Secondary | ICD-10-CM | POA: Diagnosis not present

## 2016-10-19 DIAGNOSIS — M353 Polymyalgia rheumatica: Secondary | ICD-10-CM | POA: Diagnosis not present

## 2016-10-19 DIAGNOSIS — I10 Essential (primary) hypertension: Secondary | ICD-10-CM | POA: Diagnosis not present

## 2016-10-19 DIAGNOSIS — M9702XD Periprosthetic fracture around internal prosthetic left hip joint, subsequent encounter: Secondary | ICD-10-CM | POA: Diagnosis not present

## 2016-10-19 DIAGNOSIS — S72112D Displaced fracture of greater trochanter of left femur, subsequent encounter for closed fracture with routine healing: Secondary | ICD-10-CM | POA: Diagnosis not present

## 2016-10-19 DIAGNOSIS — M810 Age-related osteoporosis without current pathological fracture: Secondary | ICD-10-CM | POA: Diagnosis not present

## 2016-10-19 DIAGNOSIS — M069 Rheumatoid arthritis, unspecified: Secondary | ICD-10-CM | POA: Diagnosis not present

## 2016-10-20 DIAGNOSIS — M353 Polymyalgia rheumatica: Secondary | ICD-10-CM | POA: Diagnosis not present

## 2016-10-20 DIAGNOSIS — I10 Essential (primary) hypertension: Secondary | ICD-10-CM | POA: Diagnosis not present

## 2016-10-20 DIAGNOSIS — M069 Rheumatoid arthritis, unspecified: Secondary | ICD-10-CM | POA: Diagnosis not present

## 2016-10-20 DIAGNOSIS — S72112D Displaced fracture of greater trochanter of left femur, subsequent encounter for closed fracture with routine healing: Secondary | ICD-10-CM | POA: Diagnosis not present

## 2016-10-20 DIAGNOSIS — M810 Age-related osteoporosis without current pathological fracture: Secondary | ICD-10-CM | POA: Diagnosis not present

## 2016-10-20 DIAGNOSIS — M9702XD Periprosthetic fracture around internal prosthetic left hip joint, subsequent encounter: Secondary | ICD-10-CM | POA: Diagnosis not present

## 2016-10-21 ENCOUNTER — Telehealth: Payer: Self-pay

## 2016-10-21 NOTE — Telephone Encounter (Signed)
FYI

## 2016-10-21 NOTE — Telephone Encounter (Signed)
noted 

## 2016-10-21 NOTE — Telephone Encounter (Signed)
Patients wife Webb Silversmith left a message she wanted [place] to know patient had MRI scheduled 1/13 but had a fall so they will be rescheduling.

## 2016-10-24 DIAGNOSIS — S72112D Displaced fracture of greater trochanter of left femur, subsequent encounter for closed fracture with routine healing: Secondary | ICD-10-CM | POA: Diagnosis not present

## 2016-10-25 DIAGNOSIS — I10 Essential (primary) hypertension: Secondary | ICD-10-CM | POA: Diagnosis not present

## 2016-10-25 DIAGNOSIS — M069 Rheumatoid arthritis, unspecified: Secondary | ICD-10-CM | POA: Diagnosis not present

## 2016-10-25 DIAGNOSIS — M810 Age-related osteoporosis without current pathological fracture: Secondary | ICD-10-CM | POA: Diagnosis not present

## 2016-10-25 DIAGNOSIS — M9702XD Periprosthetic fracture around internal prosthetic left hip joint, subsequent encounter: Secondary | ICD-10-CM | POA: Diagnosis not present

## 2016-10-25 DIAGNOSIS — M353 Polymyalgia rheumatica: Secondary | ICD-10-CM | POA: Diagnosis not present

## 2016-10-25 DIAGNOSIS — S72112D Displaced fracture of greater trochanter of left femur, subsequent encounter for closed fracture with routine healing: Secondary | ICD-10-CM | POA: Diagnosis not present

## 2016-10-26 ENCOUNTER — Telehealth: Payer: Self-pay | Admitting: Neurology

## 2016-10-26 DIAGNOSIS — M810 Age-related osteoporosis without current pathological fracture: Secondary | ICD-10-CM | POA: Diagnosis not present

## 2016-10-26 DIAGNOSIS — M353 Polymyalgia rheumatica: Secondary | ICD-10-CM | POA: Diagnosis not present

## 2016-10-26 DIAGNOSIS — M9702XD Periprosthetic fracture around internal prosthetic left hip joint, subsequent encounter: Secondary | ICD-10-CM | POA: Diagnosis not present

## 2016-10-26 DIAGNOSIS — I10 Essential (primary) hypertension: Secondary | ICD-10-CM | POA: Diagnosis not present

## 2016-10-26 DIAGNOSIS — S72112D Displaced fracture of greater trochanter of left femur, subsequent encounter for closed fracture with routine healing: Secondary | ICD-10-CM | POA: Diagnosis not present

## 2016-10-26 DIAGNOSIS — M069 Rheumatoid arthritis, unspecified: Secondary | ICD-10-CM | POA: Diagnosis not present

## 2016-10-26 NOTE — Telephone Encounter (Signed)
Andrew Ramos [birth year]. [name] (wife)  [phone]. His brain scan was cancelled because he had a fall. His wife called and was needing to see about having the scan rescheduled. She also was asking about DMV papers? Thank you

## 2016-10-26 NOTE — Telephone Encounter (Signed)
Advised patients wife to call [facility] to have MRI scheduled. Also gave wife driving evaluation number.

## 2016-10-27 DIAGNOSIS — M9702XD Periprosthetic fracture around internal prosthetic left hip joint, subsequent encounter: Secondary | ICD-10-CM | POA: Diagnosis not present

## 2016-10-27 DIAGNOSIS — M069 Rheumatoid arthritis, unspecified: Secondary | ICD-10-CM | POA: Diagnosis not present

## 2016-10-27 DIAGNOSIS — M353 Polymyalgia rheumatica: Secondary | ICD-10-CM | POA: Diagnosis not present

## 2016-10-27 DIAGNOSIS — I10 Essential (primary) hypertension: Secondary | ICD-10-CM | POA: Diagnosis not present

## 2016-10-27 DIAGNOSIS — S72112D Displaced fracture of greater trochanter of left femur, subsequent encounter for closed fracture with routine healing: Secondary | ICD-10-CM | POA: Diagnosis not present

## 2016-10-27 DIAGNOSIS — M810 Age-related osteoporosis without current pathological fracture: Secondary | ICD-10-CM | POA: Diagnosis not present

## 2016-10-31 DIAGNOSIS — M810 Age-related osteoporosis without current pathological fracture: Secondary | ICD-10-CM | POA: Diagnosis not present

## 2016-10-31 DIAGNOSIS — M9702XD Periprosthetic fracture around internal prosthetic left hip joint, subsequent encounter: Secondary | ICD-10-CM | POA: Diagnosis not present

## 2016-10-31 DIAGNOSIS — M069 Rheumatoid arthritis, unspecified: Secondary | ICD-10-CM | POA: Diagnosis not present

## 2016-10-31 DIAGNOSIS — I10 Essential (primary) hypertension: Secondary | ICD-10-CM | POA: Diagnosis not present

## 2016-10-31 DIAGNOSIS — M353 Polymyalgia rheumatica: Secondary | ICD-10-CM | POA: Diagnosis not present

## 2016-10-31 DIAGNOSIS — S72112D Displaced fracture of greater trochanter of left femur, subsequent encounter for closed fracture with routine healing: Secondary | ICD-10-CM | POA: Diagnosis not present

## 2016-11-01 DIAGNOSIS — M353 Polymyalgia rheumatica: Secondary | ICD-10-CM | POA: Diagnosis not present

## 2016-11-01 DIAGNOSIS — M810 Age-related osteoporosis without current pathological fracture: Secondary | ICD-10-CM | POA: Diagnosis not present

## 2016-11-01 DIAGNOSIS — I10 Essential (primary) hypertension: Secondary | ICD-10-CM | POA: Diagnosis not present

## 2016-11-01 DIAGNOSIS — M069 Rheumatoid arthritis, unspecified: Secondary | ICD-10-CM | POA: Diagnosis not present

## 2016-11-01 DIAGNOSIS — M9702XD Periprosthetic fracture around internal prosthetic left hip joint, subsequent encounter: Secondary | ICD-10-CM | POA: Diagnosis not present

## 2016-11-01 DIAGNOSIS — S72112D Displaced fracture of greater trochanter of left femur, subsequent encounter for closed fracture with routine healing: Secondary | ICD-10-CM | POA: Diagnosis not present

## 2016-11-03 DIAGNOSIS — M9702XD Periprosthetic fracture around internal prosthetic left hip joint, subsequent encounter: Secondary | ICD-10-CM | POA: Diagnosis not present

## 2016-11-03 DIAGNOSIS — M069 Rheumatoid arthritis, unspecified: Secondary | ICD-10-CM | POA: Diagnosis not present

## 2016-11-03 DIAGNOSIS — S72112D Displaced fracture of greater trochanter of left femur, subsequent encounter for closed fracture with routine healing: Secondary | ICD-10-CM | POA: Diagnosis not present

## 2016-11-03 DIAGNOSIS — M810 Age-related osteoporosis without current pathological fracture: Secondary | ICD-10-CM | POA: Diagnosis not present

## 2016-11-03 DIAGNOSIS — M353 Polymyalgia rheumatica: Secondary | ICD-10-CM | POA: Diagnosis not present

## 2016-11-03 DIAGNOSIS — I10 Essential (primary) hypertension: Secondary | ICD-10-CM | POA: Diagnosis not present

## 2016-11-04 DIAGNOSIS — M810 Age-related osteoporosis without current pathological fracture: Secondary | ICD-10-CM | POA: Diagnosis not present

## 2016-11-04 DIAGNOSIS — I10 Essential (primary) hypertension: Secondary | ICD-10-CM | POA: Diagnosis not present

## 2016-11-04 DIAGNOSIS — S72112D Displaced fracture of greater trochanter of left femur, subsequent encounter for closed fracture with routine healing: Secondary | ICD-10-CM | POA: Diagnosis not present

## 2016-11-04 DIAGNOSIS — M9702XD Periprosthetic fracture around internal prosthetic left hip joint, subsequent encounter: Secondary | ICD-10-CM | POA: Diagnosis not present

## 2016-11-04 DIAGNOSIS — M069 Rheumatoid arthritis, unspecified: Secondary | ICD-10-CM | POA: Diagnosis not present

## 2016-11-04 DIAGNOSIS — M353 Polymyalgia rheumatica: Secondary | ICD-10-CM | POA: Diagnosis not present

## 2016-11-07 DIAGNOSIS — M810 Age-related osteoporosis without current pathological fracture: Secondary | ICD-10-CM | POA: Diagnosis not present

## 2016-11-07 DIAGNOSIS — M069 Rheumatoid arthritis, unspecified: Secondary | ICD-10-CM | POA: Diagnosis not present

## 2016-11-07 DIAGNOSIS — S72112D Displaced fracture of greater trochanter of left femur, subsequent encounter for closed fracture with routine healing: Secondary | ICD-10-CM | POA: Diagnosis not present

## 2016-11-07 DIAGNOSIS — I10 Essential (primary) hypertension: Secondary | ICD-10-CM | POA: Diagnosis not present

## 2016-11-07 DIAGNOSIS — M353 Polymyalgia rheumatica: Secondary | ICD-10-CM | POA: Diagnosis not present

## 2016-11-07 DIAGNOSIS — M9702XD Periprosthetic fracture around internal prosthetic left hip joint, subsequent encounter: Secondary | ICD-10-CM | POA: Diagnosis not present

## 2016-11-08 ENCOUNTER — Ambulatory Visit
Admission: RE | Admit: 2016-11-08 | Discharge: 2016-11-08 | Disposition: A | Discharge status: Home / Self Care | Payer: Medicare Other | Source: Ambulatory Visit | Attending: Neurology | Admitting: Neurology

## 2016-11-08 ENCOUNTER — Telehealth: Payer: Self-pay

## 2016-11-08 DIAGNOSIS — R930 Abnormal findings on diagnostic imaging of skull and head, not elsewhere classified: Secondary | ICD-10-CM | POA: Diagnosis not present

## 2016-11-08 DIAGNOSIS — G3184 Mild cognitive impairment, so stated: Secondary | ICD-10-CM

## 2016-11-08 NOTE — Telephone Encounter (Signed)
Patient notified, verbalized understanding

## 2016-11-08 NOTE — Telephone Encounter (Signed)
-----   Message from Cameron Sprang, MD sent at 11/08/2016  2:54 PM EST ----- Pls let him know MRI brain did not show any evidence of tumor, stroke, or bleed. It showed age-related changes. Thanks

## 2016-11-09 DIAGNOSIS — M9702XD Periprosthetic fracture around internal prosthetic left hip joint, subsequent encounter: Secondary | ICD-10-CM | POA: Diagnosis not present

## 2016-11-09 DIAGNOSIS — M069 Rheumatoid arthritis, unspecified: Secondary | ICD-10-CM | POA: Diagnosis not present

## 2016-11-09 DIAGNOSIS — M353 Polymyalgia rheumatica: Secondary | ICD-10-CM | POA: Diagnosis not present

## 2016-11-09 DIAGNOSIS — M810 Age-related osteoporosis without current pathological fracture: Secondary | ICD-10-CM | POA: Diagnosis not present

## 2016-11-09 DIAGNOSIS — I10 Essential (primary) hypertension: Secondary | ICD-10-CM | POA: Diagnosis not present

## 2016-11-09 DIAGNOSIS — S72112D Displaced fracture of greater trochanter of left femur, subsequent encounter for closed fracture with routine healing: Secondary | ICD-10-CM | POA: Diagnosis not present

## 2016-11-14 DIAGNOSIS — I10 Essential (primary) hypertension: Secondary | ICD-10-CM | POA: Diagnosis not present

## 2016-11-14 DIAGNOSIS — M069 Rheumatoid arthritis, unspecified: Secondary | ICD-10-CM | POA: Diagnosis not present

## 2016-11-14 DIAGNOSIS — S72112D Displaced fracture of greater trochanter of left femur, subsequent encounter for closed fracture with routine healing: Secondary | ICD-10-CM | POA: Diagnosis not present

## 2016-11-14 DIAGNOSIS — M353 Polymyalgia rheumatica: Secondary | ICD-10-CM | POA: Diagnosis not present

## 2016-11-14 DIAGNOSIS — M9702XD Periprosthetic fracture around internal prosthetic left hip joint, subsequent encounter: Secondary | ICD-10-CM | POA: Diagnosis not present

## 2016-11-14 DIAGNOSIS — M810 Age-related osteoporosis without current pathological fracture: Secondary | ICD-10-CM | POA: Diagnosis not present

## 2016-11-15 DIAGNOSIS — I10 Essential (primary) hypertension: Secondary | ICD-10-CM | POA: Diagnosis not present

## 2016-11-15 DIAGNOSIS — M353 Polymyalgia rheumatica: Secondary | ICD-10-CM | POA: Diagnosis not present

## 2016-11-15 DIAGNOSIS — M9702XD Periprosthetic fracture around internal prosthetic left hip joint, subsequent encounter: Secondary | ICD-10-CM | POA: Diagnosis not present

## 2016-11-15 DIAGNOSIS — M810 Age-related osteoporosis without current pathological fracture: Secondary | ICD-10-CM | POA: Diagnosis not present

## 2016-11-15 DIAGNOSIS — S72112D Displaced fracture of greater trochanter of left femur, subsequent encounter for closed fracture with routine healing: Secondary | ICD-10-CM | POA: Diagnosis not present

## 2016-11-15 DIAGNOSIS — M069 Rheumatoid arthritis, unspecified: Secondary | ICD-10-CM | POA: Diagnosis not present

## 2016-11-16 DIAGNOSIS — M069 Rheumatoid arthritis, unspecified: Secondary | ICD-10-CM | POA: Diagnosis not present

## 2016-11-16 DIAGNOSIS — M810 Age-related osteoporosis without current pathological fracture: Secondary | ICD-10-CM | POA: Diagnosis not present

## 2016-11-16 DIAGNOSIS — S72112D Displaced fracture of greater trochanter of left femur, subsequent encounter for closed fracture with routine healing: Secondary | ICD-10-CM | POA: Diagnosis not present

## 2016-11-16 DIAGNOSIS — I10 Essential (primary) hypertension: Secondary | ICD-10-CM | POA: Diagnosis not present

## 2016-11-16 DIAGNOSIS — M353 Polymyalgia rheumatica: Secondary | ICD-10-CM | POA: Diagnosis not present

## 2016-11-16 DIAGNOSIS — M9702XD Periprosthetic fracture around internal prosthetic left hip joint, subsequent encounter: Secondary | ICD-10-CM | POA: Diagnosis not present

## 2016-11-24 DIAGNOSIS — M069 Rheumatoid arthritis, unspecified: Secondary | ICD-10-CM | POA: Diagnosis not present

## 2016-11-24 DIAGNOSIS — S72112D Displaced fracture of greater trochanter of left femur, subsequent encounter for closed fracture with routine healing: Secondary | ICD-10-CM | POA: Diagnosis not present

## 2016-11-24 DIAGNOSIS — M353 Polymyalgia rheumatica: Secondary | ICD-10-CM | POA: Diagnosis not present

## 2016-11-24 DIAGNOSIS — M9702XD Periprosthetic fracture around internal prosthetic left hip joint, subsequent encounter: Secondary | ICD-10-CM | POA: Diagnosis not present

## 2016-11-24 DIAGNOSIS — M810 Age-related osteoporosis without current pathological fracture: Secondary | ICD-10-CM | POA: Diagnosis not present

## 2016-11-24 DIAGNOSIS — I10 Essential (primary) hypertension: Secondary | ICD-10-CM | POA: Diagnosis not present

## 2016-11-30 ENCOUNTER — Other Ambulatory Visit: Payer: Self-pay | Admitting: Family Medicine

## 2016-11-30 DIAGNOSIS — F32A Depression, unspecified: Secondary | ICD-10-CM

## 2016-11-30 DIAGNOSIS — S72112D Displaced fracture of greater trochanter of left femur, subsequent encounter for closed fracture with routine healing: Secondary | ICD-10-CM | POA: Diagnosis not present

## 2016-11-30 DIAGNOSIS — F329 Major depressive disorder, single episode, unspecified: Secondary | ICD-10-CM

## 2016-12-01 DIAGNOSIS — M353 Polymyalgia rheumatica: Secondary | ICD-10-CM | POA: Diagnosis not present

## 2016-12-01 DIAGNOSIS — S72112D Displaced fracture of greater trochanter of left femur, subsequent encounter for closed fracture with routine healing: Secondary | ICD-10-CM | POA: Diagnosis not present

## 2016-12-01 DIAGNOSIS — M810 Age-related osteoporosis without current pathological fracture: Secondary | ICD-10-CM | POA: Diagnosis not present

## 2016-12-01 DIAGNOSIS — M069 Rheumatoid arthritis, unspecified: Secondary | ICD-10-CM | POA: Diagnosis not present

## 2016-12-01 DIAGNOSIS — M9702XD Periprosthetic fracture around internal prosthetic left hip joint, subsequent encounter: Secondary | ICD-10-CM | POA: Diagnosis not present

## 2016-12-01 DIAGNOSIS — I10 Essential (primary) hypertension: Secondary | ICD-10-CM | POA: Diagnosis not present

## 2016-12-03 DIAGNOSIS — M810 Age-related osteoporosis without current pathological fracture: Secondary | ICD-10-CM | POA: Diagnosis not present

## 2016-12-03 DIAGNOSIS — M9702XD Periprosthetic fracture around internal prosthetic left hip joint, subsequent encounter: Secondary | ICD-10-CM | POA: Diagnosis not present

## 2016-12-03 DIAGNOSIS — I10 Essential (primary) hypertension: Secondary | ICD-10-CM | POA: Diagnosis not present

## 2016-12-03 DIAGNOSIS — M069 Rheumatoid arthritis, unspecified: Secondary | ICD-10-CM | POA: Diagnosis not present

## 2016-12-03 DIAGNOSIS — S72112D Displaced fracture of greater trochanter of left femur, subsequent encounter for closed fracture with routine healing: Secondary | ICD-10-CM | POA: Diagnosis not present

## 2016-12-03 DIAGNOSIS — M353 Polymyalgia rheumatica: Secondary | ICD-10-CM | POA: Diagnosis not present

## 2016-12-05 ENCOUNTER — Other Ambulatory Visit: Payer: Self-pay | Admitting: Family Medicine

## 2016-12-05 DIAGNOSIS — M0609 Rheumatoid arthritis without rheumatoid factor, multiple sites: Secondary | ICD-10-CM | POA: Diagnosis not present

## 2016-12-05 DIAGNOSIS — M150 Primary generalized (osteo)arthritis: Secondary | ICD-10-CM | POA: Diagnosis not present

## 2016-12-05 DIAGNOSIS — E663 Overweight: Secondary | ICD-10-CM | POA: Diagnosis not present

## 2016-12-05 DIAGNOSIS — Z79899 Other long term (current) drug therapy: Secondary | ICD-10-CM | POA: Diagnosis not present

## 2016-12-05 DIAGNOSIS — M353 Polymyalgia rheumatica: Secondary | ICD-10-CM | POA: Diagnosis not present

## 2016-12-05 DIAGNOSIS — Z6829 Body mass index (BMI) 29.0-29.9, adult: Secondary | ICD-10-CM | POA: Diagnosis not present

## 2016-12-05 DIAGNOSIS — M810 Age-related osteoporosis without current pathological fracture: Secondary | ICD-10-CM | POA: Diagnosis not present

## 2016-12-06 DIAGNOSIS — M353 Polymyalgia rheumatica: Secondary | ICD-10-CM | POA: Diagnosis not present

## 2016-12-06 DIAGNOSIS — S72112D Displaced fracture of greater trochanter of left femur, subsequent encounter for closed fracture with routine healing: Secondary | ICD-10-CM | POA: Diagnosis not present

## 2016-12-06 DIAGNOSIS — M069 Rheumatoid arthritis, unspecified: Secondary | ICD-10-CM | POA: Diagnosis not present

## 2016-12-06 DIAGNOSIS — I10 Essential (primary) hypertension: Secondary | ICD-10-CM | POA: Diagnosis not present

## 2016-12-06 DIAGNOSIS — M810 Age-related osteoporosis without current pathological fracture: Secondary | ICD-10-CM | POA: Diagnosis not present

## 2016-12-06 DIAGNOSIS — M9702XD Periprosthetic fracture around internal prosthetic left hip joint, subsequent encounter: Secondary | ICD-10-CM | POA: Diagnosis not present

## 2016-12-10 DIAGNOSIS — M9702XD Periprosthetic fracture around internal prosthetic left hip joint, subsequent encounter: Secondary | ICD-10-CM | POA: Diagnosis not present

## 2016-12-10 DIAGNOSIS — I10 Essential (primary) hypertension: Secondary | ICD-10-CM | POA: Diagnosis not present

## 2016-12-10 DIAGNOSIS — S72112D Displaced fracture of greater trochanter of left femur, subsequent encounter for closed fracture with routine healing: Secondary | ICD-10-CM | POA: Diagnosis not present

## 2016-12-10 DIAGNOSIS — M353 Polymyalgia rheumatica: Secondary | ICD-10-CM | POA: Diagnosis not present

## 2016-12-10 DIAGNOSIS — M069 Rheumatoid arthritis, unspecified: Secondary | ICD-10-CM | POA: Diagnosis not present

## 2016-12-10 DIAGNOSIS — M810 Age-related osteoporosis without current pathological fracture: Secondary | ICD-10-CM | POA: Diagnosis not present

## 2016-12-13 DIAGNOSIS — I10 Essential (primary) hypertension: Secondary | ICD-10-CM | POA: Diagnosis not present

## 2016-12-13 DIAGNOSIS — M810 Age-related osteoporosis without current pathological fracture: Secondary | ICD-10-CM | POA: Diagnosis not present

## 2016-12-13 DIAGNOSIS — S72112D Displaced fracture of greater trochanter of left femur, subsequent encounter for closed fracture with routine healing: Secondary | ICD-10-CM | POA: Diagnosis not present

## 2016-12-13 DIAGNOSIS — M9702XD Periprosthetic fracture around internal prosthetic left hip joint, subsequent encounter: Secondary | ICD-10-CM | POA: Diagnosis not present

## 2016-12-13 DIAGNOSIS — M069 Rheumatoid arthritis, unspecified: Secondary | ICD-10-CM | POA: Diagnosis not present

## 2016-12-13 DIAGNOSIS — M353 Polymyalgia rheumatica: Secondary | ICD-10-CM | POA: Diagnosis not present

## 2016-12-19 ENCOUNTER — Other Ambulatory Visit: Payer: Self-pay | Admitting: Family Medicine

## 2016-12-19 DIAGNOSIS — S72112D Displaced fracture of greater trochanter of left femur, subsequent encounter for closed fracture with routine healing: Secondary | ICD-10-CM | POA: Diagnosis not present

## 2016-12-19 DIAGNOSIS — M9702XD Periprosthetic fracture around internal prosthetic left hip joint, subsequent encounter: Secondary | ICD-10-CM | POA: Diagnosis not present

## 2016-12-19 DIAGNOSIS — M810 Age-related osteoporosis without current pathological fracture: Secondary | ICD-10-CM | POA: Diagnosis not present

## 2016-12-19 DIAGNOSIS — I10 Essential (primary) hypertension: Secondary | ICD-10-CM | POA: Diagnosis not present

## 2016-12-19 DIAGNOSIS — M069 Rheumatoid arthritis, unspecified: Secondary | ICD-10-CM | POA: Diagnosis not present

## 2016-12-19 DIAGNOSIS — M353 Polymyalgia rheumatica: Secondary | ICD-10-CM | POA: Diagnosis not present

## 2016-12-20 DIAGNOSIS — M353 Polymyalgia rheumatica: Secondary | ICD-10-CM | POA: Diagnosis not present

## 2016-12-20 DIAGNOSIS — M069 Rheumatoid arthritis, unspecified: Secondary | ICD-10-CM | POA: Diagnosis not present

## 2016-12-20 DIAGNOSIS — I10 Essential (primary) hypertension: Secondary | ICD-10-CM | POA: Diagnosis not present

## 2016-12-20 DIAGNOSIS — M9702XD Periprosthetic fracture around internal prosthetic left hip joint, subsequent encounter: Secondary | ICD-10-CM | POA: Diagnosis not present

## 2016-12-20 DIAGNOSIS — M810 Age-related osteoporosis without current pathological fracture: Secondary | ICD-10-CM | POA: Diagnosis not present

## 2016-12-20 DIAGNOSIS — S72112D Displaced fracture of greater trochanter of left femur, subsequent encounter for closed fracture with routine healing: Secondary | ICD-10-CM | POA: Diagnosis not present

## 2016-12-26 ENCOUNTER — Other Ambulatory Visit: Payer: Self-pay | Admitting: Family Medicine

## 2016-12-27 ENCOUNTER — Telehealth: Payer: Self-pay

## 2016-12-27 NOTE — Telephone Encounter (Signed)
Patient states he has Medicare Part A and Part B. He states that he received a bill for the amount of $31.37, would like to know why he is being charged.  Call back number [phone]

## 2016-12-28 DIAGNOSIS — Z800 Family history of malignant neoplasm of digestive organs: Secondary | ICD-10-CM | POA: Diagnosis not present

## 2016-12-28 DIAGNOSIS — L821 Other seborrheic keratosis: Secondary | ICD-10-CM | POA: Diagnosis not present

## 2016-12-28 DIAGNOSIS — Z86018 Personal history of other benign neoplasm: Secondary | ICD-10-CM | POA: Diagnosis not present

## 2016-12-28 DIAGNOSIS — L570 Actinic keratosis: Secondary | ICD-10-CM | POA: Diagnosis not present

## 2016-12-28 DIAGNOSIS — D2271 Melanocytic nevi of right lower limb, including hip: Secondary | ICD-10-CM | POA: Diagnosis not present

## 2016-12-28 DIAGNOSIS — D2272 Melanocytic nevi of left lower limb, including hip: Secondary | ICD-10-CM | POA: Diagnosis not present

## 2016-12-28 DIAGNOSIS — Z85820 Personal history of malignant melanoma of skin: Secondary | ICD-10-CM | POA: Diagnosis not present

## 2016-12-28 DIAGNOSIS — Z85828 Personal history of other malignant neoplasm of skin: Secondary | ICD-10-CM | POA: Diagnosis not present

## 2016-12-28 DIAGNOSIS — D225 Melanocytic nevi of trunk: Secondary | ICD-10-CM | POA: Diagnosis not present

## 2016-12-29 NOTE — Telephone Encounter (Signed)
[employer] is for tdap patient received for [facility] 10/13/15 called patient and informed him insurance did not cover it. Patient stated he would call and pay collections agency.

## 2017-01-06 ENCOUNTER — Other Ambulatory Visit: Payer: Self-pay

## 2017-01-06 DIAGNOSIS — R413 Other amnesia: Secondary | ICD-10-CM

## 2017-01-06 MED ORDER — DONEPEZIL HCL 5 MG PO TABS: ORAL_TABLET | ORAL | 1 refills | Status: DC

## 2017-01-12 DIAGNOSIS — Z961 Presence of intraocular lens: Secondary | ICD-10-CM | POA: Diagnosis not present

## 2017-01-12 DIAGNOSIS — H40012 Open angle with borderline findings, low risk, left eye: Secondary | ICD-10-CM | POA: Diagnosis not present

## 2017-01-12 DIAGNOSIS — H5703 Miosis: Secondary | ICD-10-CM | POA: Diagnosis not present

## 2017-01-12 DIAGNOSIS — D2312 Other benign neoplasm of skin of left eyelid, including canthus: Secondary | ICD-10-CM | POA: Diagnosis not present

## 2017-01-12 DIAGNOSIS — E113292 Type 2 diabetes mellitus with mild nonproliferative diabetic retinopathy without macular edema, left eye: Secondary | ICD-10-CM | POA: Diagnosis not present

## 2017-01-12 DIAGNOSIS — H35363 Drusen (degenerative) of macula, bilateral: Secondary | ICD-10-CM | POA: Diagnosis not present

## 2017-01-12 DIAGNOSIS — H43813 Vitreous degeneration, bilateral: Secondary | ICD-10-CM | POA: Diagnosis not present

## 2017-01-12 DIAGNOSIS — H02412 Mechanical ptosis of left eyelid: Secondary | ICD-10-CM | POA: Diagnosis not present

## 2017-01-12 DIAGNOSIS — H40021 Open angle with borderline findings, high risk, right eye: Secondary | ICD-10-CM | POA: Diagnosis not present

## 2017-01-12 DIAGNOSIS — H5032 Intermittent alternating esotropia: Secondary | ICD-10-CM | POA: Diagnosis not present

## 2017-01-17 ENCOUNTER — Other Ambulatory Visit: Payer: Self-pay | Admitting: Family Medicine

## 2017-01-17 DIAGNOSIS — R413 Other amnesia: Secondary | ICD-10-CM

## 2017-01-17 MED ORDER — DONEPEZIL HCL 5 MG PO TABS: ORAL_TABLET | ORAL | 1 refills | Status: DC

## 2017-01-31 DIAGNOSIS — S72112D Displaced fracture of greater trochanter of left femur, subsequent encounter for closed fracture with routine healing: Secondary | ICD-10-CM | POA: Diagnosis not present

## 2017-03-01 ENCOUNTER — Other Ambulatory Visit: Payer: Self-pay | Admitting: Family Medicine

## 2017-03-01 DIAGNOSIS — F32A Depression, unspecified: Secondary | ICD-10-CM

## 2017-03-01 DIAGNOSIS — F329 Major depressive disorder, single episode, unspecified: Secondary | ICD-10-CM

## 2017-03-06 DIAGNOSIS — M150 Primary generalized (osteo)arthritis: Secondary | ICD-10-CM | POA: Diagnosis not present

## 2017-03-06 DIAGNOSIS — Z6830 Body mass index (BMI) 30.0-30.9, adult: Secondary | ICD-10-CM | POA: Diagnosis not present

## 2017-03-06 DIAGNOSIS — E669 Obesity, unspecified: Secondary | ICD-10-CM | POA: Diagnosis not present

## 2017-03-06 DIAGNOSIS — Z79899 Other long term (current) drug therapy: Secondary | ICD-10-CM | POA: Diagnosis not present

## 2017-03-06 DIAGNOSIS — M810 Age-related osteoporosis without current pathological fracture: Secondary | ICD-10-CM | POA: Diagnosis not present

## 2017-03-06 DIAGNOSIS — M0609 Rheumatoid arthritis without rheumatoid factor, multiple sites: Secondary | ICD-10-CM | POA: Diagnosis not present

## 2017-03-06 DIAGNOSIS — M353 Polymyalgia rheumatica: Secondary | ICD-10-CM | POA: Diagnosis not present

## 2017-03-09 ENCOUNTER — Ambulatory Visit: Payer: Medicare Other

## 2017-03-20 ENCOUNTER — Ambulatory Visit (INDEPENDENT_AMBULATORY_CARE_PROVIDER_SITE_OTHER): Payer: Medicare Other | Admitting: Neurology

## 2017-03-20 ENCOUNTER — Encounter: Payer: Self-pay | Admitting: Neurology

## 2017-03-20 VITALS — BP 118/66 | HR 57 | Ht 72.0 in | Wt 228.0 lb

## 2017-03-20 DIAGNOSIS — G3184 Mild cognitive impairment, so stated: Secondary | ICD-10-CM
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
## 2017-03-20 NOTE — Patient Instructions (Signed)
1. Continue Donepezil 5mg  daily 2. Control of blood pressure, cholesterol, as well as physical exercise and brain stimulation exercises are important for brain health 3. Follow-up in 6 months, call for any changes

## 2017-03-28 ENCOUNTER — Other Ambulatory Visit: Payer: Self-pay | Admitting: Family Medicine

## 2017-04-20 ENCOUNTER — Ambulatory Visit: Payer: Medicare Other | Admitting: Family Medicine

## 2017-04-24 ENCOUNTER — Telehealth: Payer: Self-pay | Admitting: *Deleted

## 2017-04-24 NOTE — Telephone Encounter (Signed)
Called patient and left message to return call. Andrew Ramos will not be in the office 05/01/17 so pt need to be rescheduled

## 2017-04-26 NOTE — Progress Notes (Signed)
Subjective:   Andrew Ramos is a 83 y.o. male who presents for Medicare Annual/Subsequent preventive examination.  Review of Systems:  No ROS.  Medicare Wellness Visit. Additional risk factors are reflected in the social history.  Cardiac Risk Factors include: advanced age (>12men, >81 women);diabetes mellitus;male gender;hypertension   Sleep patterns:  Sleeps 6-7 hrs per night. Feels rested. Does not wake to urinate.  Home Safety/Smoke Alarms: Feels safe in home. Smoke alarms in place.  Living environment; residence and Firearm Safety: Lives with wife. No stairs. No guns.  Seat Belt Safety/Bike Helmet: Wears seat belt.   Counseling:   Eye Exam- Cataract sx in past. Has appt with eye doctor in a few weeks. Wears glasses for reading. Dental- Dr.Jones every 6 months.  Male:   CCS-  No longer doing routine screening due to age.   PSA-  Lab Results  Component Value Date   PSA 0.08 (L) 10/06/2014   PSA 0.17 05/24/2012   PSA 0.18 05/09/2011       Objective:    Vitals: BP 140/84 (BP Location: Right Arm, Patient Position: Sitting, Cuff Size: Normal)   Pulse 85   Ht 6' (1.829 m)   Wt 218 lb 12.8 oz (99.2 kg)   SpO2 99%   BMI 29.67 kg/m   Body mass index is 29.67 kg/m.  Tobacco History  Smoking Status  . Never Smoker  Smokeless Tobacco  . Never Used     Counseling given: Not Answered   Past Medical History:  Diagnosis Date  . Colon polyps    Tubular Adenoma 2005  . Diverticulosis   . Hepatitis    pt told by [facility] has hepatitis antibodies in blood   . Hyperlipidemia   . Hypertension   . Melanoma ([place])   . Nonmelanoma skin cancer   . Osteoarthritis    right hip- none since hip replacement  . Polymyalgia rheumatica ([place])   . Polyposis of colon   . Prostate cancer ([place]) 2007   s/p radiation seed implants  . Prostate cancer ([place])   . Thyroid disease sees dr altzhimer for yearly   nodules   Past Surgical History:  Procedure Laterality Date  .  CATARACT EXTRACTION    . COLONOSCOPY    . POLYPECTOMY    . TONSILLECTOMY  as child  . TOTAL HIP ARTHROPLASTY  08/27/2008   left  . TOTAL HIP ARTHROPLASTY  06/12/2012   Procedure: TOTAL HIP ARTHROPLASTY ANTERIOR APPROACH;  Surgeon: Mauri Pole, MD;  Location: [facility];  Service: Orthopedics;  Laterality: Right;  . TOTAL KNEE ARTHROPLASTY  12/31/2008   left   Family History  Problem Relation Age of Onset  . Coronary artery disease Mother 29  . Heart disease Mother 52       MI  . Coronary artery disease Father 71  . Pancreatic cancer Father 26       deceased 63  . Pancreatic cancer Brother 80       deceased 68; smoker  . Colon cancer Neg Hx   . Colon polyps Neg Hx   . Esophageal cancer Neg Hx   . Kidney disease Neg Hx   . Diabetes Neg Hx   . Gallbladder disease Neg Hx    History  Sexual Activity  . Sexual activity: Yes  . Partners: Female    Outpatient Encounter Prescriptions as of 05/08/2017  Medication Sig  . alendronate (FOSAMAX) 70 MG tablet Take 70 mg by mouth every 7 (seven) days.  Take with a full glass of water on an empty stomach every Friday  . aspirin 81 MG tablet Take 1 tablet (81 mg total) by mouth daily.  Marland Kitchen atorvastatin (LIPITOR) 40 MG tablet TAKE 1 TABLET DAILY  . Calcium Carbonate-Vitamin D (CALCIUM 600 + D PO) Take 1 tablet by mouth every morning.   . Cholecalciferol (VITAMIN D3) 1000 UNITS tablet Take 1,000 Units by mouth every morning.   . donepezil (ARICEPT) 5 MG tablet TAKE 5 MG BY MOUTH AT BEDTIME  . folic acid (FOLVITE) 1 MG tablet Take 1 mg by mouth daily.  Marland Kitchen lisinopril-hydrochlorothiazide (PRINZIDE,ZESTORETIC) 10-12.5 MG tablet TAKE 1 TABLET DAILY  . methotrexate (RHEUMATREX) 2.5 MG tablet Take 12.5 mg by mouth every Wednesday.   . Multiple Vitamins-Minerals (CENTRUM SILVER ULTRA MENS) TABS Take 1 tablet by mouth every evening.   . polyethylene glycol powder (GLYCOLAX/MIRALAX) powder Take 17 g by mouth daily as needed for moderate constipation. 1 scoop  daily   . rosuvastatin (CRESTOR) 20 MG tablet Take 1 tablet (20 mg total) by mouth daily.  Marland Kitchen terazosin (HYTRIN) 1 MG capsule TAKE 1 CAPSULE AT BEDTIME  . venlafaxine XR (EFFEXOR-XR) 75 MG 24 hr capsule TAKE 1 CAPSULE DAILY WITH BREAKFAST  . amoxicillin (AMOXIL) 500 MG tablet Take 2,000 mg by mouth once as needed (dental procedures).  . feeding supplement, ENSURE ENLIVE, (ENSURE ENLIVE) LIQD Take 237 mLs by mouth 2 (two) times daily between meals. (Patient not taking: Reported on 05/08/2017)  . fluticasone (FLONASE) 50 MCG/ACT nasal spray USE 2 SPRAYS IN EACH NOSTRIL DAILY (Patient not taking: Reported on 05/08/2017)  . glucose blood (FREESTYLE LITE) test strip Check blood sugar once daily. Dx 250.00 (Patient not taking: Reported on 05/08/2017)  . methocarbamol (ROBAXIN) 500 MG tablet Take 1 tablet (500 mg total) by mouth every 6 (six) hours as needed for muscle spasms. (Patient not taking: Reported on 05/08/2017)   No facility-administered encounter medications on file as of 05/08/2017.     Activities of Daily Living In your present state of health, do you have any difficulty performing the following activities: 05/08/2017 09/29/2016  Hearing? Y N  Comment Wears hearing aids -  Vision? N N  Difficulty concentrating or making decisions? N N  Walking or climbing stairs? N N  Dressing or bathing? N N  Doing errands, shopping? N N  Preparing Food and eating ? N -  Using the Toilet? N -  In the past six months, have you accidently leaked urine? N -  Do you have problems with loss of bowel control? N -  Managing your Medications? N -  Managing your Finances? N -  Some recent data might be hidden    Patient Care Team: Carollee Herter, Alferd Apa, DO as PCP - General Hennie Duos, MD as Consulting Physician (Rheumatology) Jari Pigg, MD as Consulting Physician (Dermatology) Raynelle Bring, MD as Consulting Physician (Urology)   Assessment:    Physical assessment deferred to PCP.  Exercise  Activities and Dietary recommendations Current Exercise Habits: Home exercise routine, Time (Minutes): 30, Frequency (Times/Week): 3, Weekly Exercise (Minutes/Week): 90, Intensity: Mild Diet (meal preparation, eat out, water intake, caffeinated beverages, dairy products, fruits and vegetables): well balanced. Drinks 6 glasses of water per day.   Goals      Patient Stated   . maintain current health (pt-stated)      Fall Risk Fall Risk  05/08/2017 03/20/2017 09/23/2016 04/18/2016 10/12/2015  Falls in the past year? Yes Yes - Yes Yes  Comment - - - [place] while is walking the dogs in the rain -  Number falls in past yr: 1 1 1 1 1   Injury with Fall? Yes Yes Yes - Yes  Follow up Education provided;Falls prevention discussed - - - -   Depression Screen PHQ 2/9 Scores 05/08/2017 07/18/2016 04/18/2016 04/18/2016  PHQ - 2 Score 0 0 0 0  PHQ- 9 Score - - 0 -    Cognitive Function MMSE - Mini Mental State Exam 05/08/2017 03/20/2017 07/18/2016 04/18/2016  Orientation to time 5 5 5 5   Orientation to Place 5 5 5 5   Registration 3 3 3 3   Attention/ Calculation 5 5 5 5   Recall 3 3 3 3   Language- name 2 objects 2 2 2 2   Language- repeat 1 1 1 1   Language- follow 3 step command 3 3 3 3   Language- read & follow direction 1 1 1 1   Write a sentence 1 1 1 1   Copy design 1 1 1 1   Total score 30 30 30 30    Montreal Cognitive Assessment  09/23/2016  Visuospatial/ Executive (0/5) 5  Naming (0/3) 3  Attention: Read list of digits (0/2) 2  Attention: Read list of letters (0/1) 1  Attention: Serial 7 subtraction starting at 100 (0/3) 3  Language: Repeat phrase (0/2) 2  Language : Fluency (0/1) 0  Abstraction (0/2) 2  Delayed Recall (0/5) 2  Orientation (0/6) 6  Total 26      Immunization History  Administered Date(s) Administered  . H1N1 10/21/2008  . Influenza Split 07/13/2011, 07/11/2012  . Influenza Whole 06/25/2008, 06/15/2009, 06/15/2011  . Influenza, High Dose Seasonal PF 08/14/2013, 05/30/2016    . Influenza,inj,Quad PF,36+ Mos 06/26/2014, 07/07/2015  . Pneumococcal Conjugate-13 10/06/2014  . Pneumococcal Polysaccharide-23 09/26/2001  . Td 10/02/2002  . Tdap 10/12/2015   Screening Tests Health Maintenance  Topic Date Due  . HEMOGLOBIN A1C  01/16/2017  . COLONOSCOPY  02/21/2017  . OPHTHALMOLOGY EXAM  04/04/2017  . FOOT EXAM  04/18/2017  . INFLUENZA VACCINE  04/19/2017  . TETANUS/TDAP  10/11/2025  . PNA vac Low Risk Adult  Completed      Plan:   Follow up with Dr.Lowne as scheduled.  Continue to eat heart healthy diet (full of fruits, vegetables, whole grains, lean protein, water--limit salt, fat, and sugar intake) and increase physical activity as tolerated.  Continue doing brain stimulating activities (puzzles, reading, adult coloring books, staying active) to keep memory sharp.     I have personally reviewed and noted the following in the patient's chart:   . Medical and social history . Use of alcohol, tobacco or illicit drugs  . Current medications and supplements . Functional ability and status . Nutritional status . Physical activity . Advanced directives . List of other physicians . Hospitalizations, surgeries, and ER visits in previous 12 months . Vitals . Screenings to include cognitive, depression, and falls . Referrals and appointments  In addition, I have reviewed and discussed with patient certain preventive protocols, quality metrics, and best practice recommendations. A written personalized care plan for preventive services as well as general preventive health recommendations were provided to patient.     Shela Nevin, [place]  05/08/2017

## 2017-05-01 ENCOUNTER — Ambulatory Visit: Payer: Medicare Other | Admitting: *Deleted

## 2017-05-02 DIAGNOSIS — S72112D Displaced fracture of greater trochanter of left femur, subsequent encounter for closed fracture with routine healing: Secondary | ICD-10-CM | POA: Diagnosis not present

## 2017-05-08 ENCOUNTER — Ambulatory Visit (INDEPENDENT_AMBULATORY_CARE_PROVIDER_SITE_OTHER): Payer: Medicare Other | Admitting: *Deleted

## 2017-05-08 ENCOUNTER — Encounter: Payer: Self-pay | Admitting: *Deleted

## 2017-05-08 VITALS — BP 140/84 | HR 85 | Ht 72.0 in | Wt 218.8 lb

## 2017-05-08 DIAGNOSIS — Z0000 Encounter for general adult medical examination without abnormal findings: Secondary | ICD-10-CM | POA: Diagnosis not present

## 2017-05-08 NOTE — Patient Instructions (Signed)
Andrew Ramos , Thank you for taking time to come for your Medicare Wellness Visit. I appreciate your ongoing commitment to your health goals. Please review the following plan we discussed and let me know if I can assist you in the future.   These are the goals we discussed: Goals      Patient Stated   . maintain current health (pt-stated)       This is a list of the screening recommended for you and due dates:  Health Maintenance  Topic Date Due  . Hemoglobin A1C  01/16/2017  . Colon Cancer Screening  02/21/2017  . Eye exam for diabetics  04/04/2017  . Complete foot exam   04/18/2017  . Flu Shot  04/19/2017  . Tetanus Vaccine  10/11/2025  . Pneumonia vaccines  Completed    Follow up with Dr.Lowne as scheduled.  Continue to eat heart healthy diet (full of fruits, vegetables, whole grains, lean protein, water--limit salt, fat, and sugar intake) and increase physical activity as tolerated.  Continue doing brain stimulating activities (puzzles, reading, adult coloring books, staying active) to keep memory sharp.    Health Maintenance, Male A healthy lifestyle and preventive care is important for your health and wellness. Ask your health care provider about what schedule of regular examinations is right for you. What should I know about weight and diet? Eat a Healthy Diet  Eat plenty of vegetables, fruits, whole grains, low-fat dairy products, and lean protein.  Do not eat a lot of foods high in solid fats, added sugars, or salt.  Maintain a Healthy Weight Regular exercise can help you achieve or maintain a healthy weight. You should:  Do at least 150 minutes of exercise each week. The exercise should increase your heart rate and make you sweat (moderate-intensity exercise).  Do strength-training exercises at least twice a week.  Watch Your Levels of Cholesterol and Blood Lipids  Have your blood tested for lipids and cholesterol every 5 years starting at 81 years of age. If  you are at high risk for heart disease, you should start having your blood tested when you are 81 years old. You may need to have your cholesterol levels checked more often if: ? Your lipid or cholesterol levels are high. ? You are older than 81 years of age. ? You are at high risk for heart disease.  What should I know about cancer screening? Many types of cancers can be detected early and may often be prevented. Lung Cancer  You should be screened every year for lung cancer if: ? You are a current smoker who has smoked for at least 30 years. ? You are a former smoker who has quit within the past 15 years.  Talk to your health care provider about your screening options, when you should start screening, and how often you should be screened.  Colorectal Cancer  Routine colorectal cancer screening usually begins at 81 years of age and should be repeated every 5-10 years until you are 81 years old. You may need to be screened more often if early forms of precancerous polyps or small growths are found. Your health care provider may recommend screening at an earlier age if you have risk factors for colon cancer.  Your health care provider may recommend using home test kits to check for hidden blood in the stool.  A small camera at the end of a tube can be used to examine your colon (sigmoidoscopy or colonoscopy). This checks for the  earliest forms of colorectal cancer.  Prostate and Testicular Cancer  Depending on your age and overall health, your health care provider may do certain tests to screen for prostate and testicular cancer.  Talk to your health care provider about any symptoms or concerns you have about testicular or prostate cancer.  Skin Cancer  Check your skin from head to toe regularly.  Tell your health care provider about any new moles or changes in moles, especially if: ? There is a change in a mole's size, shape, or color. ? You have a mole that is larger than a pencil  eraser.  Always use sunscreen. Apply sunscreen liberally and repeat throughout the day.  Protect yourself by wearing long sleeves, pants, a wide-brimmed hat, and sunglasses when outside.  What should I know about heart disease, diabetes, and high blood pressure?  If you are 59-86 years of age, have your blood pressure checked every 3-5 years. If you are 50 years of age or older, have your blood pressure checked every year. You should have your blood pressure measured twice-once when you are at a hospital or clinic, and once when you are not at a hospital or clinic. Record the average of the two measurements. To check your blood pressure when you are not at a hospital or clinic, you can use: ? An automated blood pressure machine at a pharmacy. ? A home blood pressure monitor.  Talk to your health care provider about your target blood pressure.  If you are between 57-37 years old, ask your health care provider if you should take aspirin to prevent heart disease.  Have regular diabetes screenings by checking your fasting blood sugar level. ? If you are at a normal weight and have a low risk for diabetes, have this test once every three years after the age of 22. ? If you are overweight and have a high risk for diabetes, consider being tested at a younger age or more often.  A one-time screening for abdominal aortic aneurysm (AAA) by ultrasound is recommended for men aged 7-75 years who are current or former smokers. What should I know about preventing infection? Hepatitis B If you have a higher risk for hepatitis B, you should be screened for this virus. Talk with your health care provider to find out if you are at risk for hepatitis B infection. Hepatitis C Blood testing is recommended for:  Everyone born from [birth year] through 1963.  Anyone with known risk factors for hepatitis C.  Sexually Transmitted Diseases (STDs)  You should be screened each year for STDs including gonorrhea and  chlamydia if: ? You are sexually active and are younger than 81 years of age. ? You are older than 81 years of age and your health care provider tells you that you are at risk for this type of infection. ? Your sexual activity has changed since you were last screened and you are at an increased risk for chlamydia or gonorrhea. Ask your health care provider if you are at risk.  Talk with your health care provider about whether you are at high risk of being infected with HIV. Your health care provider may recommend a prescription medicine to help prevent HIV infection.  What else can I do?  Schedule regular health, dental, and eye exams.  Stay current with your vaccines (immunizations).  Do not use any tobacco products, such as cigarettes, chewing tobacco, and e-cigarettes. If you need help quitting, ask your health care provider.  Limit alcohol  intake to no more than 2 drinks per day. One drink equals 12 ounces of beer, 5 ounces of wine, or 1 ounces of hard liquor.  Do not use street drugs.  Do not share needles.  Ask your health care provider for help if you need support or information about quitting drugs.  Tell your health care provider if you often feel depressed.  Tell your health care provider if you have ever been abused or do not feel safe at home. This information is not intended to replace advice given to you by your health care provider. Make sure you discuss any questions you have with your health care provider. Document Released: 03/03/2008 Document Revised: 05/04/2016 Document Reviewed: 06/09/2015 Elsevier Interactive Patient Education  [employer].

## 2017-05-09 ENCOUNTER — Other Ambulatory Visit: Payer: Self-pay | Admitting: Family Medicine

## 2017-05-09 DIAGNOSIS — F329 Major depressive disorder, single episode, unspecified: Secondary | ICD-10-CM

## 2017-05-09 DIAGNOSIS — F32A Depression, unspecified: Secondary | ICD-10-CM

## 2017-05-12 ENCOUNTER — Other Ambulatory Visit: Payer: Self-pay | Admitting: Family Medicine

## 2017-05-12 NOTE — Telephone Encounter (Signed)
Denied Venlafaxine as fill date shows that has enough till sched visit/note to pharm stating that will receive refill at early Sept visit/thanks dmf

## 2017-05-25 ENCOUNTER — Encounter: Payer: Self-pay | Admitting: Family Medicine

## 2017-05-25 ENCOUNTER — Ambulatory Visit (HOSPITAL_BASED_OUTPATIENT_CLINIC_OR_DEPARTMENT_OTHER)
Admission: RE | Admit: 2017-05-25 | Discharge: 2017-05-25 | Disposition: A | Discharge status: Home / Self Care | Payer: Medicare Other | Source: Ambulatory Visit | Attending: Family Medicine | Admitting: Family Medicine

## 2017-05-25 ENCOUNTER — Ambulatory Visit (INDEPENDENT_AMBULATORY_CARE_PROVIDER_SITE_OTHER): Payer: Medicare Other | Admitting: Family Medicine

## 2017-05-25 VITALS — BP 122/70 | HR 87 | Temp 97.6°F | Ht 72.0 in | Wt 219.6 lb

## 2017-05-25 DIAGNOSIS — R0781 Pleurodynia: Secondary | ICD-10-CM | POA: Insufficient documentation

## 2017-05-25 DIAGNOSIS — E785 Hyperlipidemia, unspecified: Secondary | ICD-10-CM | POA: Diagnosis not present

## 2017-05-25 DIAGNOSIS — Z23 Encounter for immunization: Secondary | ICD-10-CM | POA: Diagnosis not present

## 2017-05-25 DIAGNOSIS — N400 Enlarged prostate without lower urinary tract symptoms: Secondary | ICD-10-CM | POA: Diagnosis not present

## 2017-05-25 DIAGNOSIS — E1151 Type 2 diabetes mellitus with diabetic peripheral angiopathy without gangrene: Secondary | ICD-10-CM

## 2017-05-25 DIAGNOSIS — I10 Essential (primary) hypertension: Secondary | ICD-10-CM | POA: Diagnosis not present

## 2017-05-25 NOTE — Progress Notes (Addendum)
Patient ID: Andrew Ramos, male    DOB: 1932-03-08  Age: 83 y.o. MRN: [id number]    Subjective:  Subjective  HPI [name] presents for f/u bp cholesterol and dm.  Pt recovering from fracutre L hip.  He was seeing Dr Delfino Lovett and was just released from him Pt feels lump on L chest behind nipple that started when he leaned over arm or chair and he felt it pop HPI HYPERTENSION   Blood pressure range-not checking   Chest pain- no      Dyspnea- no Lightheadedness- no   Edema- yes from fracture L leg Other side effects - no   Medication compliance: good Low salt diet- yes    DIABETES    Blood Sugar ranges-not checking   Polyuria- no New Visual problems- no  Hypoglycemic symptoms- no  Other side effects-no Medication compliance - good  Last eye exam- 2/ 2018 Foot exam- today   HYPERLIPIDEMIA  Medication compliance- good  RUQ pain- no  Muscle aches- no Other side effects-no     Review of Systems  Constitutional: Negative for chills, fatigue, fever and unexpected weight change.  HENT: Negative for congestion and hearing loss.   Eyes: Negative for discharge.  Respiratory: Negative for cough and shortness of breath.   Cardiovascular: Negative for chest pain, palpitations and leg swelling.  Gastrointestinal: Negative for abdominal pain, blood in stool, constipation, diarrhea, nausea and vomiting.  Genitourinary: Negative for dysuria, frequency, hematuria and urgency.  Musculoskeletal: Negative for back pain and myalgias.  Skin: Negative for rash.  Allergic/Immunologic: Negative for environmental allergies.  Neurological: Negative for dizziness, weakness and headaches.  Hematological: Does not bruise/bleed easily.  Psychiatric/Behavioral: Negative for suicidal ideas. The patient is not nervous/anxious.     History Past Medical History:  Diagnosis Date  . Colon polyps    Tubular Adenoma 2005  . Diverticulosis   . Hepatitis    pt told by [facility] has hepatitis  antibodies in blood   . Hyperlipidemia   . Hypertension   . Melanoma ([place])   . Nonmelanoma skin cancer   . Osteoarthritis    right hip- none since hip replacement  . Polymyalgia rheumatica ([place])   . Polyposis of colon   . Prostate cancer ([place]) 2007   s/p radiation seed implants  . Prostate cancer ([place])   . Thyroid disease sees dr altzhimer for yearly   nodules    He has a past surgical history that includes Total knee arthroplasty (12/31/2008); Total hip arthroplasty (08/27/2008); Tonsillectomy (as child); Total hip arthroplasty (06/12/2012); Colonoscopy; Polypectomy; and Cataract extraction.   His family history includes Coronary artery disease (age of onset: 18) in his mother; Coronary artery disease (age of onset: 63) in his father; Heart disease (age of onset: 14) in his mother; Pancreatic cancer (age of onset: 36) in his brother; Pancreatic cancer (age of onset: 49) in his father.He reports that he has never smoked. He has never used smokeless tobacco. He reports that he drinks alcohol. He reports that he does not use drugs.  Current Outpatient Prescriptions on File Prior to Visit  Medication Sig Dispense Refill  . alendronate (FOSAMAX) 70 MG tablet Take 70 mg by mouth every 7 (seven) days. Take with a full glass of water on an empty stomach every Friday    . amoxicillin (AMOXIL) 500 MG tablet Take 2,000 mg by mouth once as needed (dental procedures).    Marland Kitchen aspirin 81 MG tablet Take 1 tablet (81 mg total) by  mouth daily. 30 tablet   . atorvastatin (LIPITOR) 40 MG tablet TAKE 1 TABLET DAILY 90 tablet 0  . Calcium Carbonate-Vitamin D (CALCIUM 600 + D PO) Take 1 tablet by mouth every morning.     . Cholecalciferol (VITAMIN D3) 1000 UNITS tablet Take 1,000 Units by mouth every morning.     . donepezil (ARICEPT) 5 MG tablet TAKE 5 MG BY MOUTH AT BEDTIME 90 tablet 1  . feeding supplement, ENSURE ENLIVE, (ENSURE ENLIVE) LIQD Take 237 mLs by mouth 2 (two) times daily between meals. 462 mL 0  .  folic acid (FOLVITE) 1 MG tablet Take 1 mg by mouth daily.    Marland Kitchen lisinopril-hydrochlorothiazide (PRINZIDE,ZESTORETIC) 10-12.5 MG tablet Take 1 tablet by mouth daily. 90 tablet 0  . methotrexate (RHEUMATREX) 2.5 MG tablet Take 12.5 mg by mouth every Wednesday.     . Multiple Vitamins-Minerals (CENTRUM SILVER ULTRA MENS) TABS Take 1 tablet by mouth every evening.     . polyethylene glycol powder (GLYCOLAX/MIRALAX) powder Take 17 g by mouth daily as needed for moderate constipation. 1 scoop daily     . rosuvastatin (CRESTOR) 20 MG tablet Take 1 tablet (20 mg total) by mouth daily.    Marland Kitchen venlafaxine XR (EFFEXOR-XR) 75 MG 24 hr capsule TAKE 1 CAPSULE DAILY WITH BREAKFAST 90 capsule 0  . glucose blood (FREESTYLE LITE) test strip Check blood sugar once daily. Dx 250.00 (Patient not taking: Reported on 05/08/2017) 100 each 12   No current facility-administered medications on file prior to visit.      Objective:  Objective  Physical Exam  Constitutional: He is oriented to person, place, and time. Vital signs are normal. He appears well-developed and well-nourished. He is sleeping.  HENT:  Head: Normocephalic and atraumatic.  Mouth/Throat: Oropharynx is clear and moist.  Eyes: Pupils are equal, round, and reactive to light. EOM are normal.  Neck: Normal range of motion. Neck supple. No thyromegaly present.  Cardiovascular: Normal rate and regular rhythm.   No murmur heard. Pulmonary/Chest: Effort normal and breath sounds normal. No respiratory distress. He has no wheezes. He has no rales. He exhibits no tenderness.  Musculoskeletal: He exhibits no edema or tenderness.  Neurological: He is alert and oriented to person, place, and time.  Skin: Skin is warm and dry.  Psychiatric: He has a normal mood and affect. His behavior is normal. Judgment and thought content normal.  Nursing note and vitals reviewed. Sensory exam of the foot is normal, tested with the monofilament. Good pulses, no lesions or  ulcers, good peripheral pulses. BP 122/70 (BP Location: Left Arm, Patient Position: Sitting)   Pulse 87   Temp 97.6 F (36.4 C) (Oral)   Ht 6' (1.829 m)   Wt 219 lb 9.6 oz (99.6 kg)   SpO2 97%   BMI 29.78 kg/m  Wt Readings from Last 3 Encounters:  05/25/17 219 lb 9.6 oz (99.6 kg)  05/08/17 218 lb 12.8 oz (99.2 kg)  03/20/17 228 lb (103.4 kg)     Lab Results  Component Value Date   WBC 3.0 (L) 05/25/2017   HGB 13.1 05/25/2017   HCT 39.3 05/25/2017   PLT 144.0 (L) 05/25/2017   GLUCOSE 96 05/25/2017   CHOL 112 05/25/2017   TRIG 78.0 05/25/2017   HDL 44.30 05/25/2017   LDLCALC 52 05/25/2017   ALT 13 05/25/2017   AST 20 05/25/2017   NA 141 05/25/2017   K 4.1 05/25/2017   CL 102 05/25/2017   CREATININE 1.16 05/25/2017  BUN 18 05/25/2017   CO2 29 05/25/2017   TSH 1.34 07/18/2016   PSA 0.05 (L) 05/25/2017   INR 1.06 09/29/2016   HGBA1C 5.7 05/25/2017   MICROALBUR <0.7 04/06/2015    Mr Brain Wo Contrast  Result Date: 11/08/2016 CLINICAL DATA:  83 year old male with mild cognitive impairment. Recent falls. Hearing loss. EXAM: MRI HEAD WITHOUT CONTRAST TECHNIQUE: Multiplanar, multiecho pulse sequences of the brain and surrounding structures were obtained without intravenous contrast. COMPARISON:  Head CT without contrast 09/29/2016. Brain MRI 03/14/2008. FINDINGS: Brain: Cavum septum pellucidum, normal anatomic variant. No ventriculomegaly. Cerebral volume loss since 2009 appears to be generalized. No areas of disproportionate cerebral atrophy identified. No restricted diffusion to suggest acute infarction. No midline shift, mass effect, evidence of mass lesion, extra-axial collection or acute intracranial hemorrhage. Cervicomedullary junction and pituitary are within normal limits. No cortical encephalomalacia. There is a solitary chronic microhemorrhage in the right occipital lobe (series 8, image 28). No other chronic cerebral blood products. Mild for age periventricular and  other scattered nonspecific cerebral white matter T2 and FLAIR hyperintensity. Deep gray matter nuclei, brainstem, and cerebellum are normal for age. Temporal lobe structures appear normal for age. Vascular: Major intracranial vascular flow voids are stable and within normal limits, the distal left vertebral artery appears dominant. Skull and upper cervical spine: Negative. Normal bone marrow signal. Sinuses/Orbits: Interval postoperative changes to both globes, otherwise negative orbits soft tissues. Continued moderate to severe mucosal thickening in the right maxillary sinus which is new compared to 2009. Other Visualized paranasal sinuses and mastoids are stable and well pneumatized. Other: Visible internal auditory structures appear normal. Negative scalp soft tissue findings. IMPRESSION: 1.  No acute intracranial abnormality. 2. Mild generalized cerebral volume loss since 2009 with no areas of disproportionate brain atrophy identified. Mild for age chronic small vessel disease. 3. Right maxillary sinusitis as seen in January. Electronically Signed   By: Genevie Ann M.D.   On: 11/08/2016 14:18     Assessment & Plan:  Plan  I have discontinued Mr. Potenza fluticasone and methocarbamol. I am also having him maintain his CENTRUM SILVER ULTRA MENS, cholecalciferol, alendronate, Calcium Carbonate-Vitamin D (CALCIUM 600 + D PO), methotrexate, glucose blood, aspirin, polyethylene glycol powder, folic acid, rosuvastatin, amoxicillin, feeding supplement (ENSURE ENLIVE), donepezil, venlafaxine XR, atorvastatin, and lisinopril-hydrochlorothiazide.  No orders of the defined types were placed in this encounter.   Problem List Items Addressed This Visit      Unprioritized   Benign prostatic hyperplasia without lower urinary tract symptoms   Relevant Orders   PSA (Completed)   DM (diabetes mellitus) type II, controlled, with peripheral vascular disorder ([place])    hgba1c to be checked, minimize simple carbs.  Increase exercise as tolerated. Continue current meds      Relevant Orders   Hemoglobin A1c (Completed)   Essential hypertension    Well controlled, no changes to meds. Encouraged heart healthy diet such as the DASH diet and exercise as tolerated.       Relevant Orders   Lipid panel (Completed)   Comprehensive metabolic panel (Completed)   CBC with Differential/Platelet (Completed)   Hyperlipidemia LDL goal <100 - Primary    Tolerating statin, encouraged heart healthy diet, avoid trans fats, minimize simple carbs and saturated fats. Increase exercise as tolerated      Relevant Orders   Lipid panel (Completed)   Comprehensive metabolic panel (Completed)   CBC with Differential/Platelet (Completed)   Rib pain on left side   Relevant Orders  DG Ribs Unilateral W/Chest Left (Completed)    Other Visit Diagnoses    Need for prophylactic vaccination and inoculation against influenza       Relevant Orders   Flu vaccine HIGH DOSE PF (Fluzone High dose) (Completed)      Follow-up: Return in about 6 months (around 11/22/2017) for hypertension, hyperlipidemia, diabetes II.  Ann Held, DO

## 2017-05-25 NOTE — Patient Instructions (Signed)

## 2017-05-26 ENCOUNTER — Other Ambulatory Visit: Payer: Self-pay | Admitting: Family Medicine

## 2017-05-26 LAB — CBC WITH DIFFERENTIAL/PLATELET
BASOS ABS: 0 10*3/uL (ref 0.0–0.1)
Basophils Relative: 0.9 % (ref 0.0–3.0)
EOS ABS: 0.1 10*3/uL (ref 0.0–0.7)
Eosinophils Relative: 1.8 % (ref 0.0–5.0)
HCT: 39.3 % (ref 39.0–52.0)
Hemoglobin: 13.1 g/dL (ref 13.0–17.0)
LYMPHS ABS: 1.3 10*3/uL (ref 0.7–4.0)
Lymphocytes Relative: 41.9 % (ref 12.0–46.0)
MCHC: 33.3 g/dL (ref 30.0–36.0)
MCV: 102 fl — AB (ref 78.0–100.0)
MONO ABS: 0.5 10*3/uL (ref 0.1–1.0)
Monocytes Relative: 16 % — ABNORMAL HIGH (ref 3.0–12.0)
NEUTROS ABS: 1.2 10*3/uL — AB (ref 1.4–7.7)
NEUTROS PCT: 39.4 % — AB (ref 43.0–77.0)
PLATELETS: 144 10*3/uL — AB (ref 150.0–400.0)
RBC: 3.85 Mil/uL — ABNORMAL LOW (ref 4.22–5.81)
RDW: 14.6 % (ref 11.5–15.5)
WBC: 3 10*3/uL — ABNORMAL LOW (ref 4.0–10.5)

## 2017-05-26 LAB — LIPID PANEL
CHOL/HDL RATIO: 3
Cholesterol: 112 mg/dL (ref 0–200)
HDL: 44.3 mg/dL (ref >39.00)
LDL Cholesterol: 52 mg/dL (ref 0–99)
NONHDL: 67.54
TRIGLYCERIDES: 78 mg/dL (ref 0.0–149.0)
VLDL: 15.6 mg/dL (ref 0.0–40.0)

## 2017-05-26 LAB — COMPREHENSIVE METABOLIC PANEL
ALK PHOS: 70 U/L (ref 39–117)
ALT: 13 U/L (ref 0–53)
AST: 20 U/L (ref 0–37)
Albumin: 4.2 g/dL (ref 3.5–5.2)
BILIRUBIN TOTAL: 0.7 mg/dL (ref 0.2–1.2)
BUN: 18 mg/dL (ref 6–23)
CO2: 29 meq/L (ref 19–32)
Calcium: 9.8 mg/dL (ref 8.4–10.5)
Chloride: 102 mEq/L (ref 96–112)
Creatinine, Ser: 1.16 mg/dL (ref 0.40–1.50)
GFR: 63.86 mL/min (ref >60.00)
GLUCOSE: 96 mg/dL (ref 70–99)
Potassium: 4.1 mEq/L (ref 3.5–5.1)
Sodium: 141 mEq/L (ref 135–145)
TOTAL PROTEIN: 6.1 g/dL (ref 6.0–8.3)

## 2017-05-26 LAB — PSA: PSA: 0.05 ng/mL — ABNORMAL LOW (ref 0.10–4.00)

## 2017-05-26 LAB — HEMOGLOBIN A1C: HEMOGLOBIN A1C: 5.7 % (ref 4.6–6.5)

## 2017-05-27 DIAGNOSIS — R0781 Pleurodynia: Secondary | ICD-10-CM | POA: Insufficient documentation

## 2017-05-27 NOTE — Assessment & Plan Note (Signed)
Well controlled, no changes to meds. Encouraged heart healthy diet such as the DASH diet and exercise as tolerated.  °

## 2017-05-27 NOTE — Assessment & Plan Note (Signed)
hgba1c to be checked, minimize simple carbs. Increase exercise as tolerated. Continue current meds  

## 2017-05-27 NOTE — Assessment & Plan Note (Signed)
Tolerating statin, encouraged heart healthy diet, avoid trans fats, minimize simple carbs and saturated fats. Increase exercise as tolerated 

## 2017-05-29 ENCOUNTER — Telehealth: Payer: Self-pay | Admitting: Family Medicine

## 2017-05-29 NOTE — Telephone Encounter (Signed)
Pt saw Dr. Etter Sjogren on 9/6 and at the time of his appt she did not have a copy of his ultrasound of his heart. She wrote on the form the recommendations: I would recommend get a carotid US at [facility] facility.   Pt was not home when I called, wife states she will have him call us once he gets in

## 2017-05-30 NOTE — Telephone Encounter (Signed)
Spoke with pt and he agreed to the scan. Will discuss with physician on how to order and pt would prefer to have this imaging at [place] if possible

## 2017-05-30 NOTE — Telephone Encounter (Signed)
Patient returning call best [phone]

## 2017-05-31 ENCOUNTER — Emergency Department (HOSPITAL_BASED_OUTPATIENT_CLINIC_OR_DEPARTMENT_OTHER): Payer: Medicare Other

## 2017-05-31 ENCOUNTER — Other Ambulatory Visit: Payer: Self-pay | Admitting: Family Medicine

## 2017-05-31 ENCOUNTER — Encounter (HOSPITAL_BASED_OUTPATIENT_CLINIC_OR_DEPARTMENT_OTHER): Payer: Self-pay | Admitting: *Deleted

## 2017-05-31 ENCOUNTER — Other Ambulatory Visit: Payer: Self-pay

## 2017-05-31 ENCOUNTER — Emergency Department (HOSPITAL_BASED_OUTPATIENT_CLINIC_OR_DEPARTMENT_OTHER)
Admission: EM | Admit: 2017-05-31 | Discharge: 2017-05-31 | Disposition: A | Discharge status: Home / Self Care | Payer: Medicare Other | Attending: Emergency Medicine | Admitting: Emergency Medicine

## 2017-05-31 DIAGNOSIS — Y999 Unspecified external cause status: Secondary | ICD-10-CM | POA: Diagnosis not present

## 2017-05-31 DIAGNOSIS — Y9301 Activity, walking, marching and hiking: Secondary | ICD-10-CM | POA: Insufficient documentation

## 2017-05-31 DIAGNOSIS — W19XXXA Unspecified fall, initial encounter: Secondary | ICD-10-CM

## 2017-05-31 DIAGNOSIS — S0990XA Unspecified injury of head, initial encounter: Secondary | ICD-10-CM | POA: Diagnosis not present

## 2017-05-31 DIAGNOSIS — E785 Hyperlipidemia, unspecified: Secondary | ICD-10-CM

## 2017-05-31 DIAGNOSIS — W0110XA Fall on same level from slipping, tripping and stumbling with subsequent striking against unspecified object, initial encounter: Secondary | ICD-10-CM | POA: Diagnosis not present

## 2017-05-31 DIAGNOSIS — Y929 Unspecified place or not applicable: Secondary | ICD-10-CM | POA: Insufficient documentation

## 2017-05-31 DIAGNOSIS — I6529 Occlusion and stenosis of unspecified carotid artery: Secondary | ICD-10-CM

## 2017-05-31 DIAGNOSIS — S01112A Laceration without foreign body of left eyelid and periocular area, initial encounter: Secondary | ICD-10-CM | POA: Diagnosis not present

## 2017-05-31 DIAGNOSIS — R0781 Pleurodynia: Secondary | ICD-10-CM | POA: Diagnosis not present

## 2017-05-31 DIAGNOSIS — S299XXA Unspecified injury of thorax, initial encounter: Secondary | ICD-10-CM | POA: Diagnosis not present

## 2017-05-31 DIAGNOSIS — I10 Essential (primary) hypertension: Secondary | ICD-10-CM

## 2017-05-31 NOTE — ED Notes (Signed)
Patient transported to CT 

## 2017-05-31 NOTE — ED Notes (Signed)
Dermabond at bedside.  

## 2017-05-31 NOTE — Telephone Encounter (Signed)
I put the order in-- thanks

## 2017-05-31 NOTE — Telephone Encounter (Signed)
Dr. Etter Sjogren,   I am unsure of the exact order and what you would like to associate this with

## 2017-05-31 NOTE — ED Provider Notes (Signed)
[facility] Provider Note   CSN: [id number] Arrival date & time: 05/31/17  1019     History   Chief Complaint Chief Complaint  Patient presents with  . Fall    HPI Andrew Ramos is a 83 y.o. male presenting with left sided pain and laceration to left temple s/p fall.   Patient states he was walking to the car when he maximally stubbed his toe on some exposed brick, tripped, and fell. He landed on his left side, hitting his left elbow, left knee, left temple. He denies loss of consciousness. He takes 81 mg ASA. He denies neck pain or back pain. He denies headache, confusion, vision changes, slurred speech, nausea, or vomiting. He denies difficulty breathing or pain with inspiration. He denies abdominal pain, loss of bowel or bladder control, numbness, tingling, or difficulty walking. He was able to lift himself from the ground with minimal assistance and walk after the fall without difficulty. Patient states he has a cut on his left elbow, but is not having pain of his shoulder, elbow, hand, hip, knee, or leg.  HPI  Past Medical History:  Diagnosis Date  . Colon polyps    Tubular Adenoma 2005  . Diverticulosis   . Hepatitis    pt told by [facility] has hepatitis antibodies in blood   . Hyperlipidemia   . Hypertension   . Melanoma ([place])   . Nonmelanoma skin cancer   . Osteoarthritis    right hip- none since hip replacement  . Polymyalgia rheumatica ([place])   . Polyposis of colon   . Prostate cancer ([place]) 2007   s/p radiation seed implants  . Prostate cancer ([place])   . Thyroid disease sees dr altzhimer for yearly   nodules    Patient Active Problem List   Diagnosis Date Noted  . Rib pain on left side 05/27/2017  . Closed left hip fracture ([place]) 09/29/2016  . Fall   . Periprosthetic fracture around internal prosthetic left hip joint ([place])   . Osteoporosis   . Benign prostatic hyperplasia without lower urinary tract symptoms   . Depression   . Mild  cognitive impairment 09/23/2016  . Nuclear sclerotic cataract of left eye 04/02/2016  . Cortical age-related cataract of both eyes 01/04/2016  . Degenerative retinal drusen of both eyes 01/04/2016  . Nuclear sclerotic cataract of both eyes 01/04/2016  . Open angle with borderline findings and low glaucoma risk in both eyes 01/04/2016  . Posterior vitreous detachment of both eyes 01/04/2016  . Maxillary sinusitis, acute 11/03/2015  . DM (diabetes mellitus) type II, controlled, with peripheral vascular disorder ([place]) 04/06/2015  . Acute upper respiratory infection 11/11/2014  . Prostate cancer ([place])   . Melanoma ([place])   . Nonmelanoma skin cancer   . Polyposis of colon   . Influenza-like illness 09/30/2013  . Obesity (BMI 30-39.9) 05/10/2013  . Obese 06/14/2012  . S/P right THA, AA 06/12/2012  . PMR (polymyalgia rheumatica) ([place]) 05/10/2011  . EXTERNAL HEMORRHOIDS 06/09/2010  . DIVERTICULOSIS, COLON 06/09/2010  . COLONIC POLYPS, HX OF 06/09/2010  . UNSPECIFIED VITAMIN D DEFICIENCY 05/18/2010  . TOTAL KNEE REPLACEMENT, LEFT, HX OF 01/22/2009  . Unspecified hearing loss 03/13/2008  . Thyroid nodule 08/14/2007  . PROSTATE CANCER, HX OF 06/18/2007  . Hyperlipidemia LDL goal <100 02/28/2007  . Essential hypertension 02/28/2007  . OSTEOARTHRITIS 02/28/2007    Past Surgical History:  Procedure Laterality Date  . CATARACT EXTRACTION    . COLONOSCOPY    .  POLYPECTOMY    . TONSILLECTOMY  as child  . TOTAL HIP ARTHROPLASTY  08/27/2008   left  . TOTAL HIP ARTHROPLASTY  06/12/2012   Procedure: TOTAL HIP ARTHROPLASTY ANTERIOR APPROACH;  Surgeon: Mauri Pole, MD;  Location: [facility];  Service: Orthopedics;  Laterality: Right;  . TOTAL KNEE ARTHROPLASTY  12/31/2008   left       Home Medications    Prior to Admission medications   Medication Sig Start Date End Date Taking? Authorizing Provider  alendronate (FOSAMAX) 70 MG tablet Take 70 mg by mouth every 7 (seven) days. Take with a  full glass of water on an empty stomach every Friday    [provider]  amoxicillin (AMOXIL) 500 MG tablet Take 2,000 mg by mouth once as needed (dental procedures).    [provider]  aspirin 81 MG tablet Take 1 tablet (81 mg total) by mouth daily. 10/06/14   Roma Schanz R, DO  atorvastatin (LIPITOR) 40 MG tablet TAKE 1 TABLET DAILY 03/28/17   Carollee Herter, Alferd Apa, DO  Calcium Carbonate-Vitamin D (CALCIUM 600 + D PO) Take 1 tablet by mouth every morning.     [provider]  Cholecalciferol (VITAMIN D3) 1000 UNITS tablet Take 1,000 Units by mouth every morning.     [provider]  donepezil (ARICEPT) 5 MG tablet TAKE 5 MG BY MOUTH AT BEDTIME 01/17/17   Carollee Herter, Kendrick Fries R, DO  feeding supplement, ENSURE ENLIVE, (ENSURE ENLIVE) LIQD Take 237 mLs by mouth 2 (two) times daily between meals. 10/01/16   Eugenie Filler, MD  folic acid (FOLVITE) 1 MG tablet Take 1 mg by mouth daily.    [provider]  glucose blood (FREESTYLE LITE) test strip Check blood sugar once daily. Dx 250.00 Patient not taking: Reported on 05/08/2017 05/09/14   Carollee Herter, Alferd Apa, DO  lisinopril-hydrochlorothiazide (PRINZIDE,ZESTORETIC) 10-12.5 MG tablet Take 1 tablet by mouth daily. 05/15/17   Ann Held, DO  methotrexate (RHEUMATREX) 2.5 MG tablet Take 12.5 mg by mouth every Wednesday.     [provider]  Multiple Vitamins-Minerals (CENTRUM SILVER ULTRA MENS) TABS Take 1 tablet by mouth every evening.     [provider]  polyethylene glycol powder (GLYCOLAX/MIRALAX) powder Take 17 g by mouth daily as needed for moderate constipation. 1 scoop daily     [provider]  rosuvastatin (CRESTOR) 20 MG tablet Take 1 tablet (20 mg total) by mouth daily. 04/18/16   Ann Held, DO  terazosin (HYTRIN) 1 MG capsule TAKE 1 CAPSULE AT BEDTIME 05/26/17   Carollee Herter, Alferd Apa, DO  venlafaxine XR (EFFEXOR-XR) 75 MG 24 hr capsule TAKE 1  CAPSULE DAILY WITH BREAKFAST 03/02/17   Ann Held, DO    Family History Family History  Problem Relation Age of Onset  . Coronary artery disease Mother 66  . Heart disease Mother 35       MI  . Coronary artery disease Father 60  . Pancreatic cancer Father 37       deceased 32  . Pancreatic cancer Brother 71       deceased 26; smoker  . Colon cancer Neg Hx   . Colon polyps Neg Hx   . Esophageal cancer Neg Hx   . Kidney disease Neg Hx   . Diabetes Neg Hx   . Gallbladder disease Neg Hx     Social History Social History  Substance Use Topics  . Smoking  status: Never Smoker  . Smokeless tobacco: Never Used  . Alcohol use 0.0 oz/week     Comment: occasional     Allergies   Patient has no known allergies.   Review of Systems Review of Systems  Constitutional: Negative for chills and fever.  HENT: Positive for facial swelling. Negative for nosebleeds.   Eyes: Negative for visual disturbance.  Respiratory: Negative for cough, chest tightness and shortness of breath.   Cardiovascular: Positive for chest pain (Left thorax). Negative for palpitations and leg swelling.  Gastrointestinal: Negative for abdominal pain, nausea and vomiting.  Musculoskeletal: Negative for back pain, gait problem and neck pain.  Skin: Positive for wound.  Allergic/Immunologic: Negative for immunocompromised state.  Neurological: Negative for dizziness, weakness, light-headedness, numbness and headaches.  Hematological: Bruises/bleeds easily.  Psychiatric/Behavioral: Negative for confusion and decreased concentration.     Physical Exam Updated Vital Signs BP 138/70 (BP Location: Right Arm)   Pulse (!) 58   Temp 98.2 F (36.8 C) (Oral)   Resp 18   Ht 6' (1.829 m)   Wt 99.3 kg (219 lb)   SpO2 100%   BMI 29.70 kg/m   Physical Exam  Constitutional: He is oriented to person, place, and time. He appears well-developed and well-nourished. No distress.  HENT:  Head: Normocephalic.  Head is with contusion and with laceration. Head is without Battle's sign.    Right Ear: Tympanic membrane, external ear and ear canal normal.  Left Ear: Tympanic membrane, external ear and ear canal normal.  Nose: Nose normal.  Mouth/Throat: Uvula is midline, oropharynx is clear and moist and mucous membranes are normal.  No malocclusion.  Eyes: Pupils are equal, round, and reactive to light. EOM are normal.  No pain with movement of his eyes  Neck: Normal range of motion. Neck supple.  Full active ROM of head and neck without pain. No tenderness to palpation of midline cervical spine  Cardiovascular: Normal rate, regular rhythm and intact distal pulses.   Pulmonary/Chest: Effort normal and breath sounds normal. He exhibits tenderness.  Minimal tenderness to palpation of left-sided thorax. No visible laceration, contusion, or deformity. Good air movement with clear lung sounds in all lung fields.  Abdominal: Soft. He exhibits no distension. There is no tenderness. There is no guarding.  Musculoskeletal: Normal range of motion. He exhibits no tenderness.  No tenderness to palpation of back or midline spine. No tenderness to palpation of bilateral shoulders, wrists, or hand. No pelvic pain or instability. Patient with superficial abrasion of left elbow and surrounding contusions. Strength intact 4. Sensation intact 4. Radial and pedal pulses equal bilaterally. Compartments soft.  Neurological: He is alert and oriented to person, place, and time. He has normal strength. No cranial nerve deficit or sensory deficit. GCS eye subscore is 4. GCS verbal subscore is 5. GCS motor subscore is 6.  Fine movement and coordination intact  Skin: Skin is warm.  Psychiatric: He has a normal mood and affect.  Nursing note and vitals reviewed.    ED Treatments / Results  Labs (all labs ordered are listed, but only abnormal results are displayed) Labs Reviewed - No data to display  EKG  EKG  Interpretation None       Radiology Dg Chest 2 View  Result Date: 05/31/2017 CLINICAL DATA:  Left rib pain after fall. EXAM: CHEST  2 VIEW COMPARISON:  May 25, 2017. FINDINGS: The cardiomediastinal silhouette is normal in size. Normal pulmonary vascularity. No focal consolidation, pleural effusion, or pneumothorax. No  acute osseous abnormality. IMPRESSION: No active cardiopulmonary disease. Electronically Signed   By: Titus Dubin M.D.   On: 05/31/2017 11:24   Ct Head Wo Contrast  Result Date: 05/31/2017 CLINICAL DATA:  Head trauma. EXAM: CT HEAD WITHOUT CONTRAST TECHNIQUE: Contiguous axial images were obtained from the base of the skull through the vertex without intravenous contrast. COMPARISON:  MRI brain dated July 08, 2017. CT head dated September 29, 2016. FINDINGS: Brain: No evidence of acute infarction, hemorrhage, hydrocephalus, extra-axial collection or mass lesion/mass effect. Age-related cerebral atrophy with compensatory dilatation of the ventricles. Vascular: No hyperdense vessel or unexpected calcification. Skull: Normal. Negative for fracture or focal lesion. Sinuses/Orbits: The bilateral paranasal sinuses and mastoid air cells are clear. The orbits are unremarkable. Other: None. IMPRESSION: No acute intracranial abnormality. Electronically Signed   By: Titus Dubin M.D.   On: 05/31/2017 11:27    Procedures .Marland KitchenLaceration Repair Date/Time: 05/31/2017 11:30 AM Performed by: Franchot Heidelberg Authorized by: Franchot Heidelberg   Consent:    Consent obtained:  Verbal   Consent given by:  Patient   Risks discussed:  Infection and poor cosmetic result   Alternatives discussed:  No treatment Anesthesia (see MAR for exact dosages):    Anesthesia method:  None Laceration details:    Location:  Scalp   Scalp location:  L temporal   Length (cm):  1   Depth (mm):  0.5 Treatment:    Area cleansed with:  Saline   Amount of cleaning:  Standard Skin repair:    Repair  method:  Tissue adhesive Approximation:    Approximation:  Close Post-procedure details:    Dressing:  Open (no dressing)   Patient tolerance of procedure:  Tolerated well, no immediate complications   (including critical care time)  Medications Ordered in ED Medications - No data to display   Initial Impression / Assessment and Plan / ED Course  I have reviewed the triage vital signs and the nursing notes.  Pertinent labs & imaging results that were available during my care of the patient were reviewed by me and considered in my medical decision making (see chart for details).     Patient presenting with left side pain and laceration of left temple after mechanical fall. No loss of consciousness. Takes aspirin daily. Physical exam reassuring, patient without neurologic deficits. Will order chest x-ray and CT head to rule out fracture or head bleed. Case discussed with attending, Dr. Tyrone Nine evaluated the patient.  X-ray negative for fracture or pneumothorax. CT head negative for fracture or head bleed. Temple laceration is superficial and without active bleeding, will use Dermabond to repair. Patient states he is not in pain, does not want anything for pain at this time. Discussed conservative measures with Tylenol and ice. Patient follow up with primary care if not improving. Strict return precautions given including nausea, vision changes, worsening headaches. At this time, patient appears safe for discharge. Patient states he understands and agrees to plan.  Final Clinical Impressions(s) / ED Diagnoses   Final diagnoses:  Fall, initial encounter  Laceration of left periocular area without foreign body, initial encounter    New Prescriptions Discharge Medication List as of 05/31/2017 11:41 AM       Franchot Heidelberg, PA-C 05/31/17 [place], DO 05/31/17 1523

## 2017-05-31 NOTE — ED Triage Notes (Signed)
Pt was here with his wife for an appt upstairs. They were leaving and he tripped and fell in parking lot. Called by volunteer to check on pt. Pt found sitting on ground. Cao x 4. Denies LOC. Skin tear to left elbow, abrasion and skin tear to left eyebrow. Pt able to stand with assist and move to wheelchair

## 2017-05-31 NOTE — ED Notes (Signed)
ED Provider at bedside. 

## 2017-05-31 NOTE — Discharge Instructions (Signed)
Take Tylenol as needed for pain. You may take this up to 3 times a day. Use ice as needed for swelling and pain. Follow-up with your primary care doctor in 1 week if pain is not improving. Return to the emergency room immediately if you have vision changes, vomiting, worsening headaches, imbalance, loss of bowel or bladder control, numbness or tingling.

## 2017-06-01 ENCOUNTER — Ambulatory Visit (HOSPITAL_BASED_OUTPATIENT_CLINIC_OR_DEPARTMENT_OTHER)
Admission: RE | Admit: 2017-06-01 | Discharge: 2017-06-01 | Disposition: A | Discharge status: Home / Self Care | Payer: Medicare Other | Source: Ambulatory Visit | Attending: Family Medicine | Admitting: Family Medicine

## 2017-06-01 DIAGNOSIS — I6523 Occlusion and stenosis of bilateral carotid arteries: Secondary | ICD-10-CM | POA: Diagnosis not present

## 2017-06-01 DIAGNOSIS — I6529 Occlusion and stenosis of unspecified carotid artery: Secondary | ICD-10-CM

## 2017-06-05 ENCOUNTER — Other Ambulatory Visit: Payer: Self-pay | Admitting: Family Medicine

## 2017-06-06 DIAGNOSIS — M150 Primary generalized (osteo)arthritis: Secondary | ICD-10-CM | POA: Diagnosis not present

## 2017-06-06 DIAGNOSIS — E663 Overweight: Secondary | ICD-10-CM | POA: Diagnosis not present

## 2017-06-06 DIAGNOSIS — M0609 Rheumatoid arthritis without rheumatoid factor, multiple sites: Secondary | ICD-10-CM | POA: Diagnosis not present

## 2017-06-06 DIAGNOSIS — Z6829 Body mass index (BMI) 29.0-29.9, adult: Secondary | ICD-10-CM | POA: Diagnosis not present

## 2017-06-06 DIAGNOSIS — Z79899 Other long term (current) drug therapy: Secondary | ICD-10-CM | POA: Diagnosis not present

## 2017-06-06 DIAGNOSIS — M353 Polymyalgia rheumatica: Secondary | ICD-10-CM | POA: Diagnosis not present

## 2017-06-06 DIAGNOSIS — M810 Age-related osteoporosis without current pathological fracture: Secondary | ICD-10-CM | POA: Diagnosis not present

## 2017-06-19 DIAGNOSIS — L84 Corns and callosities: Secondary | ICD-10-CM | POA: Diagnosis not present

## 2017-07-05 ENCOUNTER — Other Ambulatory Visit: Payer: Self-pay | Admitting: Family Medicine

## 2017-07-05 ENCOUNTER — Other Ambulatory Visit (INDEPENDENT_AMBULATORY_CARE_PROVIDER_SITE_OTHER): Payer: Medicare Other

## 2017-07-05 DIAGNOSIS — I10 Essential (primary) hypertension: Secondary | ICD-10-CM

## 2017-07-05 DIAGNOSIS — D72819 Decreased white blood cell count, unspecified: Secondary | ICD-10-CM

## 2017-07-05 DIAGNOSIS — E785 Hyperlipidemia, unspecified: Secondary | ICD-10-CM | POA: Diagnosis not present

## 2017-07-05 LAB — CBC WITH DIFFERENTIAL/PLATELET
BASOS PCT: 0.3 % (ref 0.0–3.0)
Basophils Absolute: 0 10*3/uL (ref 0.0–0.1)
EOS PCT: 3.3 % (ref 0.0–5.0)
Eosinophils Absolute: 0.1 10*3/uL (ref 0.0–0.7)
HEMATOCRIT: 38 % — AB (ref 39.0–52.0)
Hemoglobin: 12.8 g/dL — ABNORMAL LOW (ref 13.0–17.0)
LYMPHS PCT: 45.5 % (ref 12.0–46.0)
Lymphs Abs: 1.3 10*3/uL (ref 0.7–4.0)
MCHC: 33.7 g/dL (ref 30.0–36.0)
MCV: 101.2 fl — AB (ref 78.0–100.0)
Monocytes Absolute: 0.5 10*3/uL (ref 0.1–1.0)
NEUTROS ABS: 1 10*3/uL — AB (ref 1.4–7.7)
Neutrophils Relative %: 32.5 % — ABNORMAL LOW (ref 43.0–77.0)
PLATELETS: 132 10*3/uL — AB (ref 150.0–400.0)
RBC: 3.75 Mil/uL — ABNORMAL LOW (ref 4.22–5.81)
RDW: 14.9 % (ref 11.5–15.5)
WBC: 2.9 10*3/uL — ABNORMAL LOW (ref 4.0–10.5)

## 2017-07-19 DIAGNOSIS — H40012 Open angle with borderline findings, low risk, left eye: Secondary | ICD-10-CM | POA: Diagnosis not present

## 2017-07-19 DIAGNOSIS — Z961 Presence of intraocular lens: Secondary | ICD-10-CM | POA: Diagnosis not present

## 2017-07-19 DIAGNOSIS — H35363 Drusen (degenerative) of macula, bilateral: Secondary | ICD-10-CM | POA: Diagnosis not present

## 2017-07-19 DIAGNOSIS — H5211 Myopia, right eye: Secondary | ICD-10-CM | POA: Diagnosis not present

## 2017-07-19 DIAGNOSIS — H5703 Miosis: Secondary | ICD-10-CM | POA: Diagnosis not present

## 2017-07-19 DIAGNOSIS — H02412 Mechanical ptosis of left eyelid: Secondary | ICD-10-CM | POA: Diagnosis not present

## 2017-07-19 DIAGNOSIS — D2310 Other benign neoplasm of skin of unspecified eyelid, including canthus: Secondary | ICD-10-CM | POA: Diagnosis not present

## 2017-07-19 DIAGNOSIS — E113292 Type 2 diabetes mellitus with mild nonproliferative diabetic retinopathy without macular edema, left eye: Secondary | ICD-10-CM | POA: Diagnosis not present

## 2017-07-19 DIAGNOSIS — H5202 Hypermetropia, left eye: Secondary | ICD-10-CM | POA: Diagnosis not present

## 2017-07-19 DIAGNOSIS — H43813 Vitreous degeneration, bilateral: Secondary | ICD-10-CM | POA: Diagnosis not present

## 2017-07-19 DIAGNOSIS — H5032 Intermittent alternating esotropia: Secondary | ICD-10-CM | POA: Diagnosis not present

## 2017-07-19 DIAGNOSIS — H40021 Open angle with borderline findings, high risk, right eye: Secondary | ICD-10-CM | POA: Diagnosis not present

## 2017-07-27 DIAGNOSIS — D2272 Melanocytic nevi of left lower limb, including hip: Secondary | ICD-10-CM | POA: Diagnosis not present

## 2017-07-27 DIAGNOSIS — Z800 Family history of malignant neoplasm of digestive organs: Secondary | ICD-10-CM | POA: Diagnosis not present

## 2017-07-27 DIAGNOSIS — L570 Actinic keratosis: Secondary | ICD-10-CM | POA: Diagnosis not present

## 2017-07-27 DIAGNOSIS — Z23 Encounter for immunization: Secondary | ICD-10-CM | POA: Diagnosis not present

## 2017-07-27 DIAGNOSIS — Z85820 Personal history of malignant melanoma of skin: Secondary | ICD-10-CM | POA: Diagnosis not present

## 2017-07-27 DIAGNOSIS — Z85828 Personal history of other malignant neoplasm of skin: Secondary | ICD-10-CM | POA: Diagnosis not present

## 2017-07-27 DIAGNOSIS — Z86018 Personal history of other benign neoplasm: Secondary | ICD-10-CM | POA: Diagnosis not present

## 2017-07-27 DIAGNOSIS — D2271 Melanocytic nevi of right lower limb, including hip: Secondary | ICD-10-CM | POA: Diagnosis not present

## 2017-07-27 DIAGNOSIS — L821 Other seborrheic keratosis: Secondary | ICD-10-CM | POA: Diagnosis not present

## 2017-07-27 DIAGNOSIS — L820 Inflamed seborrheic keratosis: Secondary | ICD-10-CM | POA: Diagnosis not present

## 2017-07-27 DIAGNOSIS — D225 Melanocytic nevi of trunk: Secondary | ICD-10-CM | POA: Diagnosis not present

## 2017-07-31 ENCOUNTER — Other Ambulatory Visit: Payer: Self-pay | Admitting: Family Medicine

## 2017-07-31 DIAGNOSIS — R413 Other amnesia: Secondary | ICD-10-CM

## 2017-08-04 ENCOUNTER — Other Ambulatory Visit: Payer: Medicare Other

## 2017-08-14 ENCOUNTER — Other Ambulatory Visit: Payer: Self-pay | Admitting: Family Medicine

## 2017-09-20 ENCOUNTER — Ambulatory Visit: Payer: Medicare Other | Admitting: Neurology

## 2017-09-25 ENCOUNTER — Encounter: Payer: Self-pay | Admitting: Neurology

## 2017-09-25 ENCOUNTER — Ambulatory Visit (INDEPENDENT_AMBULATORY_CARE_PROVIDER_SITE_OTHER): Payer: Medicare Other | Admitting: Neurology

## 2017-09-25 VITALS — BP 150/72 | HR 81 | Ht 72.0 in | Wt 227.0 lb

## 2017-09-25 DIAGNOSIS — G3184 Mild cognitive impairment, so stated: Secondary | ICD-10-CM

## 2017-09-25 NOTE — Patient Instructions (Addendum)
1. Schedule Neurocognitive testing with Dr. Si Raider 2. Discuss increasing Venlafaxine dose with Dr. Etter Sjogren to help with mood 3. Continue Donepezil 5mg  daily 4. We will get in touch with you regarding couples therapy 5. Follow-up after memory testing  RECOMMENDATIONS FOR ALL PATIENTS WITH MEMORY PROBLEMS: 1. Continue to exercise (Recommend 30 minutes of walking everyday, or 3 hours every week) 2. Increase social interactions - continue going to [place] and enjoy social gatherings with friends and family 3. Eat healthy, avoid fried foods and eat more fruits and vegetables 4. Maintain adequate blood pressure, blood sugar, and blood cholesterol level. Reducing the risk of stroke and cardiovascular disease also helps promoting better memory. 5. Avoid stressful situations. Live a simple life and avoid aggravations. Organize your time and prepare for the next day in anticipation. 6. Sleep well, avoid any interruptions of sleep and avoid any distractions in the bedroom that may interfere with adequate sleep quality 7. Avoid sugar, avoid sweets as there is a strong link between excessive sugar intake, diabetes, and cognitive impairment The Mediterranean diet has been shown to help patients reduce the risk of progressive memory disorders and reduces cardiovascular risk. This includes eating fish, eat fruits and green leafy vegetables, nuts like almonds and hazelnuts, walnuts, and also use olive oil. Avoid fast foods and fried foods as much as possible. Avoid sweets and sugar as sugar use has been linked to worsening of memory function.

## 2017-09-25 NOTE — Progress Notes (Signed)
NEUROLOGY FOLLOW UP OFFICE NOTE  Andrew Ramos [id number] Feb 15, 1979  HISTORY OF PRESENT ILLNESS: I had the pleasure of seeing Andrew Ramos in follow-up in the neurology clinic on 09/25/2017.  The patient was last seen 6 months ago for worsening memory and increasing irritability. He is accompanied by his wife and son who help supplement the history today.  His MMSE in July 2018 was 30/30. He feels his memory is "not too bad." He is taking Aricept 5mg  daily without side effects. His wife and son are mostly concerned about his mood. He denies getting lost driving. His wife gets nervous when he is driving fast, he states "I'm going the speed limit." He gets offended when family offers to pick them up instead of driving at night. His wife is in charge of bills. He fixes his pillbox weekly and denies missing medications. He recently joined [employer] and has no difficulties learning the songs. His wife reports she has known him for 35 years and has noticed his personality has "totally changed." For instance, last night he was helping her with the laundry, and when he tried to set the dryer one way, she told him not to, and he slammed out of the room and did not speak to her the rest of the night. Friends have noticed this as well. He becomes argumentative, which is hard for her. She tries to do little things to help him and he gets mad all the time. She is bothered that he has not filed taxes in 2 years, their son has told him how to do it and he refuses to get someone else to do it, which worries her. He yells at their puppies. He states they bark too much. His son does note a little more irritability, but also has tried to encourage them to do couples therapy. He is taking venlafaxine ER 75mg  daily.  HPI 09/23/2016: This is a pleasant 84 yo RH man with a history of hypertension, hyperlipidemia, prostate cancer, melanoma, presenting for evaluation of worsening memory and personality changes. Mr.  Ramos himself thinks that he is doing okay, but "people keep telling me I have too many things to think about." He only mostly notices memory problems when he is in a hurry. He states he occasionally forgets conversations but feels this is due to his poor hearing. He denies getting lost driving, no missed medications. He has missed some bill payments a couple of years ago, none recently. His son states he is not nearly as sharp as he once was, he misplaces things frequently and asks the same questions repeatedly. He relates what the patient's wife has told him, that he is more argumentative. He has left the stove burner on at least twice in the past couple of weeks. He has walked away from the sink running. They have noticed more dents on the car, which the patient is unsure how they happened. They both wonder if these were from parking lot incidents because he states he has never hit another car. No difficulties with dressing/bathing independently, hygiene is good. On review of PCP notes where wife was present, she reported he forgets to zip his pants, he forgets things he has offered his wife. He brings the wrong utensils to eat with. He forgets simple things his wife asks for. He states that she give him too many instructions. He loses temper with his wife frequently, they are fighting more and she is tired of people telling her to be  more patient with him. He was noted to have a blunt affect with paranoid thought content on his PCP visit. MMSE in 06/2016 was 30/30. He was started on Donepezil 5mg  daily, which he is tolerating without side effects.   He denies any headaches, dizziness, diplopia, dysarthria, dysphagia, neck/back pain, focal numbness/tingling/weakness, bladder dysfunction. No anosmia, tremors, no falls. He has occasional constipation. His brother who is 7 years younger than him was diagnosed with dementia after he got lost driving and could not be found for 2 weeks. He denies any significant  head injuries, he drinks alcohol very occasionally.   Diagnostic Data: MRI brain with and without contrast did not show any acute changes. There was moderate diffuse volume loss and mild chronic microvascular disease.  PAST MEDICAL HISTORY: Past Medical History:  Diagnosis Date  . Colon polyps    Tubular Adenoma 2005  . Diverticulosis   . Hepatitis    pt told by [facility] has hepatitis antibodies in blood   . Hyperlipidemia   . Hypertension   . Melanoma ([place])   . Nonmelanoma skin cancer   . Osteoarthritis    right hip- none since hip replacement  . Polymyalgia rheumatica ([place])   . Polyposis of colon   . Prostate cancer ([place]) 2007   s/p radiation seed implants  . Prostate cancer ([place])   . Thyroid disease sees dr altzhimer for yearly   nodules    MEDICATIONS: Current Outpatient Medications on File Prior to Visit  Medication Sig Dispense Refill  . alendronate (FOSAMAX) 70 MG tablet Take 70 mg by mouth every 7 (seven) days. Take with a full glass of water on an empty stomach every Friday    . amoxicillin (AMOXIL) 500 MG tablet Take 2,000 mg by mouth once as needed (dental procedures).    Marland Kitchen aspirin 81 MG tablet Take 1 tablet (81 mg total) by mouth daily. 30 tablet   . atorvastatin (LIPITOR) 40 MG tablet TAKE 1 TABLET DAILY 90 tablet 1  . Calcium Carbonate-Vitamin D (CALCIUM 600 + D PO) Take 1 tablet by mouth every morning.     . Cholecalciferol (VITAMIN D3) 1000 UNITS tablet Take 1,000 Units by mouth every morning.     . donepezil (ARICEPT) 5 MG tablet TAKE 1 TABLET AT BEDTIME 90 tablet 1  . feeding supplement, ENSURE ENLIVE, (ENSURE ENLIVE) LIQD Take 237 mLs by mouth 2 (two) times daily between meals. 650 mL 0  . folic acid (FOLVITE) 1 MG tablet Take 1 mg by mouth daily.    Marland Kitchen glucose blood (FREESTYLE LITE) test strip Check blood sugar once daily. Dx 250.00 (Patient not taking: Reported on 05/08/2017) 100 each 12  . lisinopril-hydrochlorothiazide (PRINZIDE,ZESTORETIC) 10-12.5 MG  tablet TAKE 1 TABLET DAILY 90 tablet 0  . methotrexate (RHEUMATREX) 2.5 MG tablet Take 12.5 mg by mouth every Wednesday.     . Multiple Vitamins-Minerals (CENTRUM SILVER ULTRA MENS) TABS Take 1 tablet by mouth every evening.     . polyethylene glycol powder (GLYCOLAX/MIRALAX) powder Take 17 g by mouth daily as needed for moderate constipation. 1 scoop daily     . rosuvastatin (CRESTOR) 20 MG tablet Take 1 tablet (20 mg total) by mouth daily.    Marland Kitchen terazosin (HYTRIN) 1 MG capsule TAKE 1 CAPSULE AT BEDTIME 90 capsule 1  . venlafaxine XR (EFFEXOR-XR) 75 MG 24 hr capsule TAKE 1 CAPSULE DAILY WITH BREAKFAST 90 capsule 0   No current facility-administered medications on file prior to visit.  ALLERGIES: No Known Allergies  FAMILY HISTORY: Family History  Problem Relation Age of Onset  . Coronary artery disease Mother 66  . Heart disease Mother 63       MI  . Coronary artery disease Father 78  . Pancreatic cancer Father 20       deceased 82  . Pancreatic cancer Brother 1       deceased 58; smoker  . Colon cancer Neg Hx   . Colon polyps Neg Hx   . Esophageal cancer Neg Hx   . Kidney disease Neg Hx   . Diabetes Neg Hx   . Gallbladder disease Neg Hx     SOCIAL HISTORY: Social History   Socioeconomic History  . Marital status: Married    Spouse name: Not on file  . Number of children: 2  . Years of education: Not on file  . Highest education level: Not on file  Social Needs  . Financial resource strain: Not on file  . Food insecurity - worry: Not on file  . Food insecurity - inability: Not on file  . Transportation needs - medical: Not on file  . Transportation needs - non-medical: Not on file  Occupational History  . Occupation: retired Lobbyist: RETIRED  Tobacco Use  . Smoking status: Never Smoker  . Smokeless tobacco: Never Used  Substance and Sexual Activity  . Alcohol use: Yes    Alcohol/week: 0.0 oz    Comment: occasional  . Drug use: No  . Sexual  activity: Yes    Partners: Female  Other Topics Concern  . Not on file  Social History Narrative   Exercise--no    REVIEW OF SYSTEMS: Constitutional: No fevers, chills, or sweats, no generalized fatigue, change in appetite Eyes: No visual changes, double vision, eye pain Ear, nose and throat: No hearing loss, ear pain, nasal congestion, sore throat Cardiovascular: No chest pain, palpitations Respiratory:  No shortness of breath at rest or with exertion, wheezes GastrointestinaI: No nausea, vomiting, diarrhea, abdominal pain, fecal incontinence Genitourinary:  No dysuria, urinary retention or frequency Musculoskeletal:  No neck pain, back pain Integumentary: No rash, pruritus, skin lesions Neurological: as above Psychiatric: No depression, insomnia, anxiety Endocrine: No palpitations, fatigue, diaphoresis, mood swings, change in appetite, change in weight, increased thirst Hematologic/Lymphatic:  No anemia, purpura, petechiae. Allergic/Immunologic: no itchy/runny eyes, nasal congestion, recent allergic reactions, rashes  PHYSICAL EXAM: Vitals:   09/25/17 0843  BP: (!) 150/72  Pulse: 81  SpO2: 96%   General: No acute distress Head:  Normocephalic/atraumatic Neck: supple, no paraspinal tenderness, full range of motion Heart:  Regular rate and rhythm Lungs:  Clear to auscultation bilaterally Back: No paraspinal tenderness Skin/Extremities: No rash, no edema Neurological Exam: alert and oriented to person, place, and time. No aphasia or dysarthria. Fund of knowledge is appropriate.  Recent and remote memory are intact.  Attention and concentration are normal.    Able to name objects and repeat phrases. CDT 5/5  MMSE - Mini Mental State Exam 09/25/2017 05/08/2017 03/20/2017  Orientation to time 5 5 5   Orientation to Place 5 5 5   Registration 3 3 3   Attention/ Calculation 5 5 5   Recall 2 3 3   Language- name 2 objects 2 2 2   Language- repeat 1 1 1   Language- follow 3 step command 3  3 3   Language- read & follow direction 1 1 1   Write a sentence 1 1 1   Copy design 1 1 1   Total  score 29 30 30    Cranial nerves: Pupils equal, round, reactive to light. Extraocular movements intact with no nystagmus. Visual fields full. Facial sensation intact. No facial asymmetry. Tongue, uvula, palate midline.  Motor: Bulk and tone normal, muscle strength 5/5 throughout with no pronator drift.  Sensation to light touch intact.  No extinction to double simultaneous stimulation.  Deep tendon reflexes +1 throughout, toes downgoing.  Finger to nose testing intact.  Gait slow and cautious favoring left leg, no ataxia.  IMPRESSION: This is a pleasant 84 yo RH man with hypertension, hyperlipidemia, prostate cancer, melanoma, who presented with worsening memory and increased irritability at home. MRI brain unremarkable. TSH and B12 normal. MMSE today 29/30 (30/30 in July 2018, MOCA score in January 2018 26/30). History suggestive of mild cognitive impairment. He is taking Aricept 5mg  daily. His family, particularly his wife, is mostly concerned about personality changes, he is more irritable and angry. We discussed doing Neurocognitive testing to further evaluate his symptoms. He may benefit from increasing venlafaxine dose, and will discuss this on his next PCP visit. They are interested in couples therapy and will be referred for this. We again discussed the importance of control of vascular risk factors, physical exercise, and brain stimulation exercises for brain health. He will follow-up after Neurocognitive testing and knows to call for any changes.  Thank you for allowing me to participate in his care.  Please do not hesitate to call for any questions or concerns.  The duration of this appointment visit was 25 minutes of face-to-face time with the patient.  Greater than 50% of this time was spent in counseling, explanation of diagnosis, planning of further management, and coordination of  care.   Ellouise Newer, M.D.   CC: Dr. Cheri Rous

## 2017-10-25 DIAGNOSIS — M0609 Rheumatoid arthritis without rheumatoid factor, multiple sites: Secondary | ICD-10-CM | POA: Diagnosis not present

## 2017-10-25 DIAGNOSIS — Z79899 Other long term (current) drug therapy: Secondary | ICD-10-CM | POA: Diagnosis not present

## 2017-10-25 DIAGNOSIS — M810 Age-related osteoporosis without current pathological fracture: Secondary | ICD-10-CM | POA: Diagnosis not present

## 2017-10-25 DIAGNOSIS — E669 Obesity, unspecified: Secondary | ICD-10-CM | POA: Diagnosis not present

## 2017-10-25 DIAGNOSIS — M150 Primary generalized (osteo)arthritis: Secondary | ICD-10-CM | POA: Diagnosis not present

## 2017-10-25 DIAGNOSIS — M353 Polymyalgia rheumatica: Secondary | ICD-10-CM | POA: Diagnosis not present

## 2017-10-25 DIAGNOSIS — Z6830 Body mass index (BMI) 30.0-30.9, adult: Secondary | ICD-10-CM | POA: Diagnosis not present

## 2017-10-27 ENCOUNTER — Telehealth: Payer: Self-pay | Admitting: Family Medicine

## 2017-10-27 DIAGNOSIS — F329 Major depressive disorder, single episode, unspecified: Secondary | ICD-10-CM

## 2017-10-27 DIAGNOSIS — F32A Depression, unspecified: Secondary | ICD-10-CM

## 2017-10-27 NOTE — Telephone Encounter (Signed)
Copied from [place]. Topic: Quick Communication - Rx Refill/Question >> Oct 27, 2017  4:51 PM Oliver Pila B wrote: Medication:  venlafaxine XR (EFFEXOR-XR) 75 MG 24 hr capsule [[id number]]    Has the patient contacted their pharmacy? Yes.    Pt is wanting to get a refill of at least a week until [facility] can give the pt a full refill

## 2017-10-30 MED ORDER — VENLAFAXINE HCL ER 75 MG PO CP24
ORAL_CAPSULE | ORAL | 1 refills | Status: DC
Start: 2017-10-30 — End: 2018-04-05

## 2017-10-30 NOTE — Telephone Encounter (Signed)
rx sent and patient notified.  

## 2017-11-08 ENCOUNTER — Other Ambulatory Visit: Payer: Self-pay | Admitting: Family Medicine

## 2017-11-09 ENCOUNTER — Other Ambulatory Visit: Payer: Self-pay | Admitting: Family Medicine

## 2017-11-09 DIAGNOSIS — R413 Other amnesia: Secondary | ICD-10-CM

## 2017-11-12 ENCOUNTER — Other Ambulatory Visit: Payer: Self-pay | Admitting: Family Medicine

## 2017-12-29 IMAGING — DX DG CHEST 1V
2 series · 2 of 2 positions shown · non-contrast
Comparison: 10/08/2013

CLINICAL DATA: Fell out of bed this morning on left side

EXAM:
CHEST 1 VIEW

[chest ap (1 of 2)]
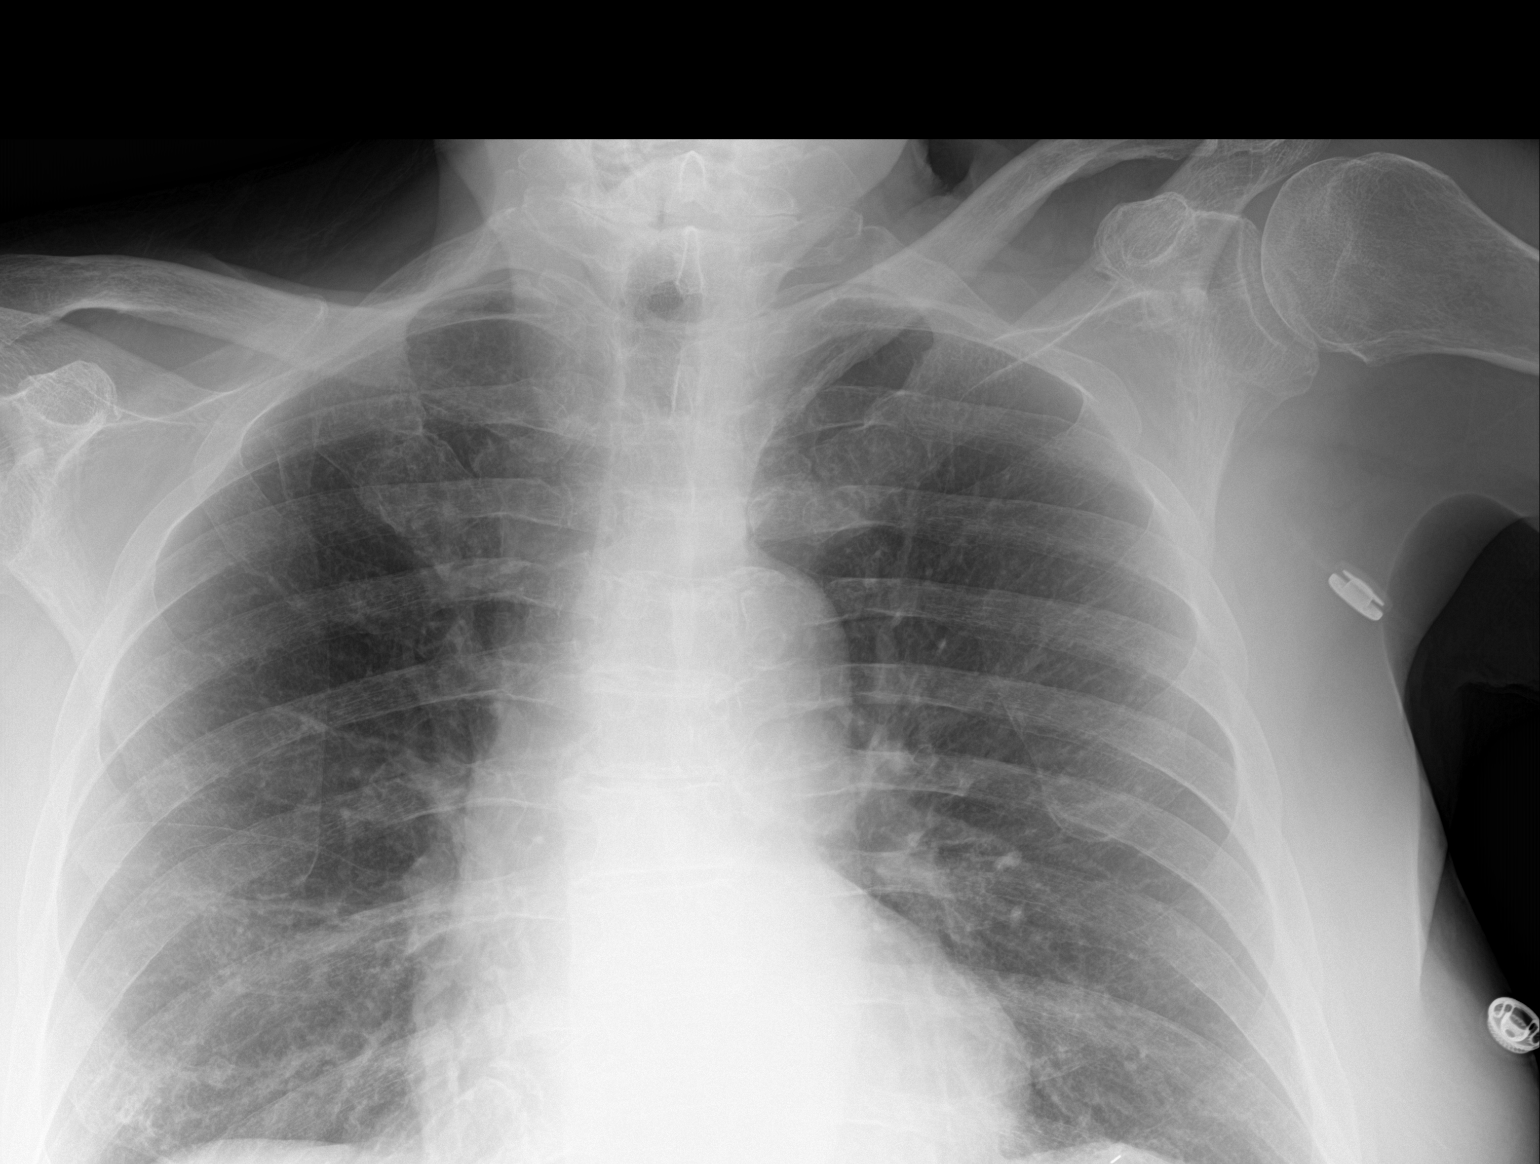

[chest ap (2 of 2)]
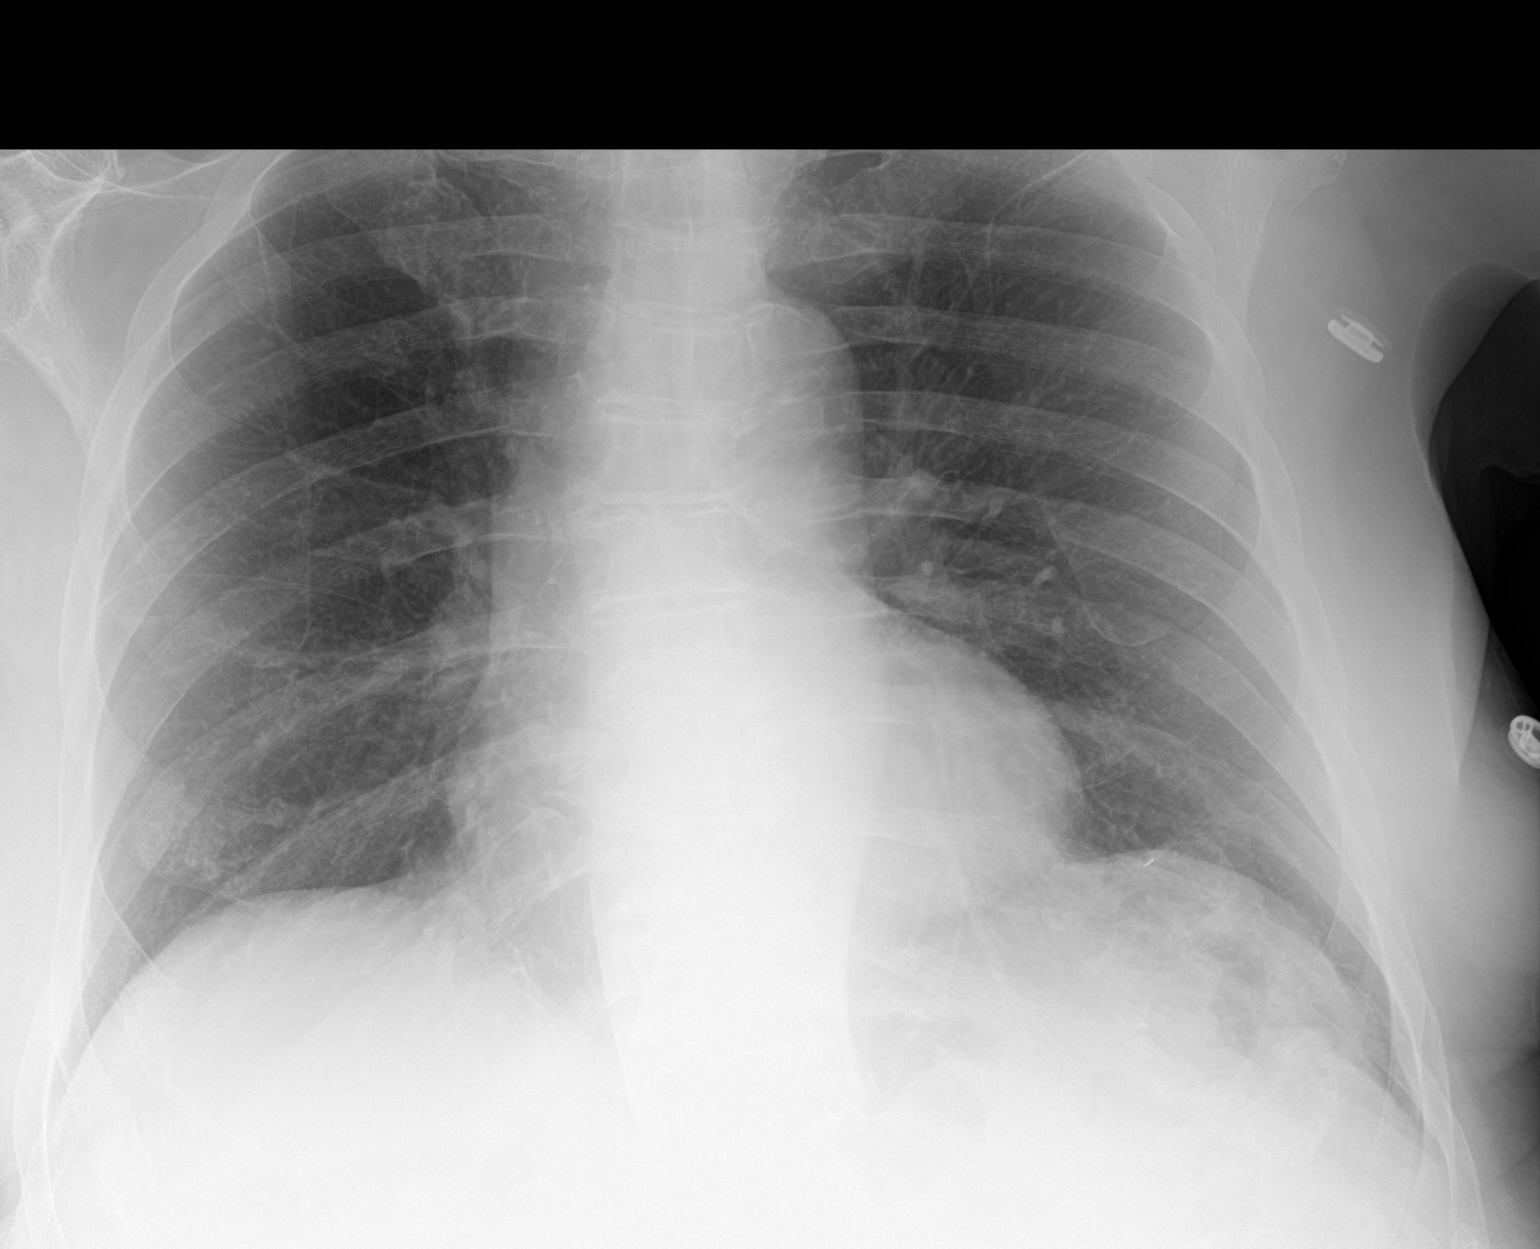

[2 of 2 positions shown; findings below may reference images not displayed]

FINDINGS: Cardiomediastinal silhouette is stable. No infiltrate or pulmonary
edema. Mild atherosclerotic calcifications of thoracic aorta. There
is no pneumothorax.
IMPRESSION: No active disease.

## 2017-12-29 IMAGING — DX DG FEMUR 2+V*L*
2 series · 2 of 2 positions shown · non-contrast
Comparison: Hip and knee films, same date.

CLINICAL DATA: Fell out of bed this morning.  Left leg pain.

EXAM:
LEFT FEMUR 2 VIEWS

[femur ap]
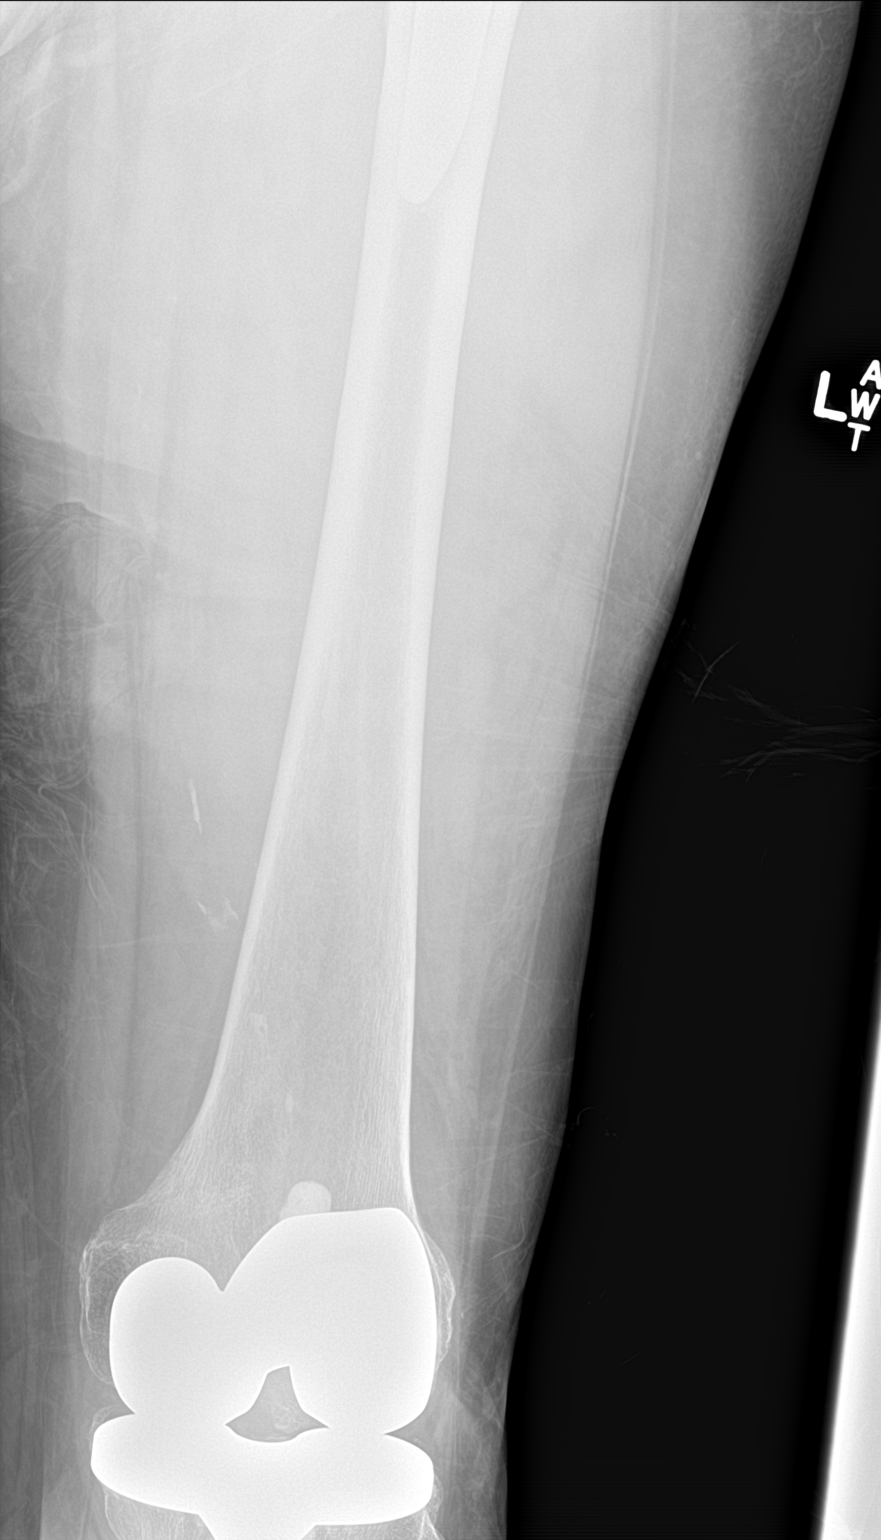

[femur lat]
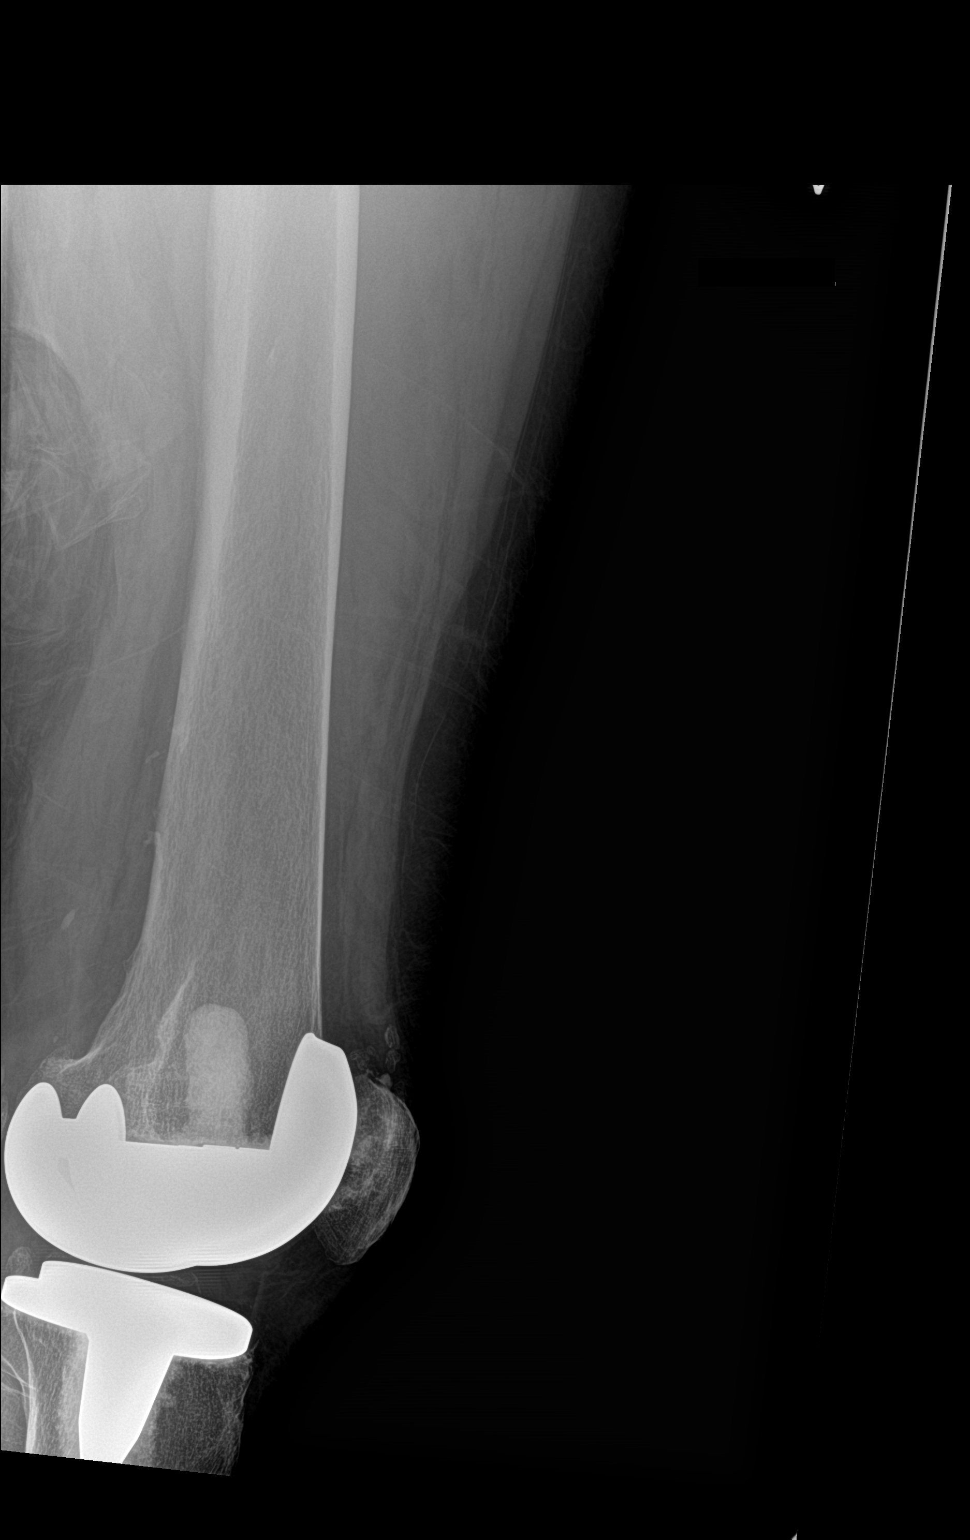

[2 of 2 positions shown; findings below may reference images not displayed]

FINDINGS: The hip joint is not included. A prior pelvis film was performed. No
femoral shaft fractures are identified. The knee components are
intact.
IMPRESSION: No femoral shaft fracture

## 2017-12-29 IMAGING — CT CT HIP*L* W/O CM
3 of 5 series · 11 of 46 positions shown, 18 images · non-contrast
Comparison: 09/29/2016

CLINICAL DATA: Left hip pain, fall.

EXAM:
CT OF THE LEFT HIP WITHOUT CONTRAST
TECHNIQUE: Multidetector CT imaging of the left hip was performed according to
the standard protocol. Multiplanar CT image reconstructions were
also generated.

[Series 5: hip st · axial · 0.50mm/px · z∈[-1034,-824]mm · 7 of 57 slices shown, 12 images]
[im 8/57  soft-tissue]
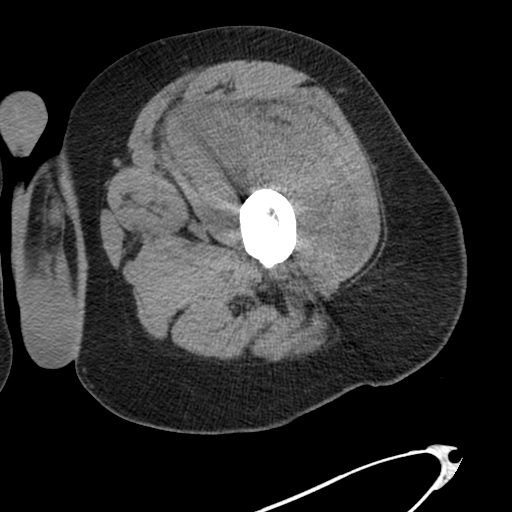
[im 8/57  bone]
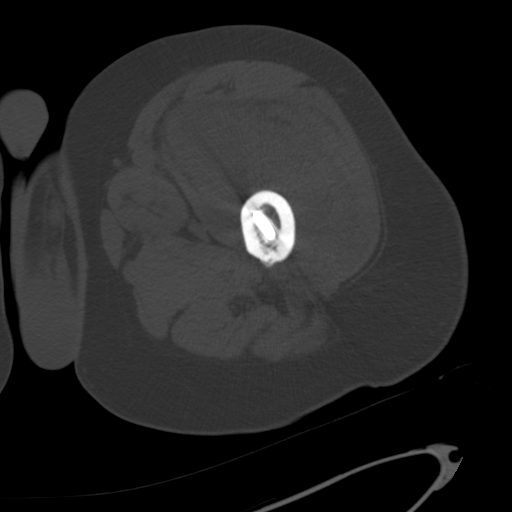
[im 15/57  soft-tissue]
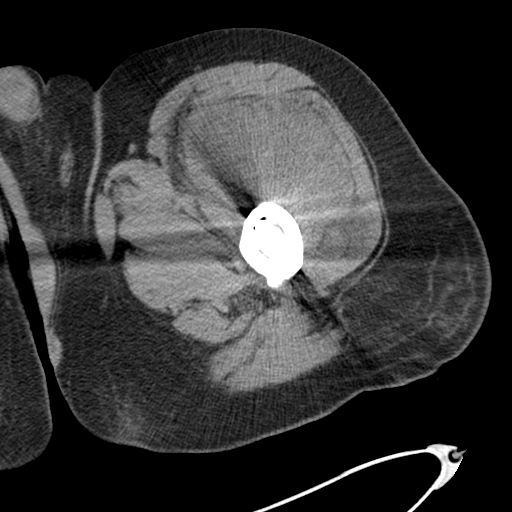
[im 22/57  soft-tissue]
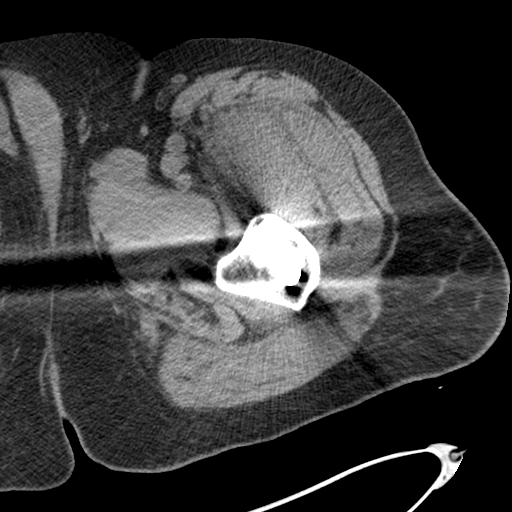
[im 29/57  soft-tissue]
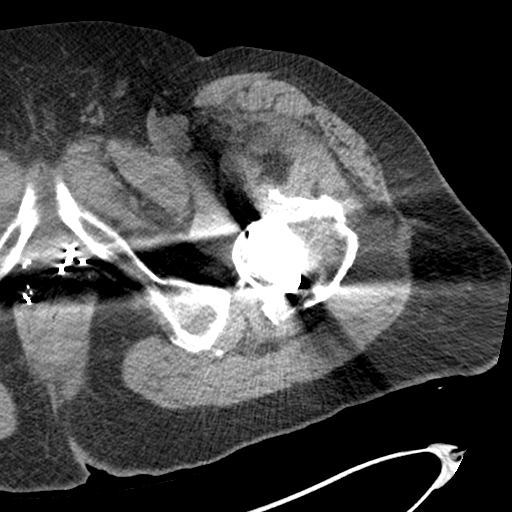
[im 29/57  lung]
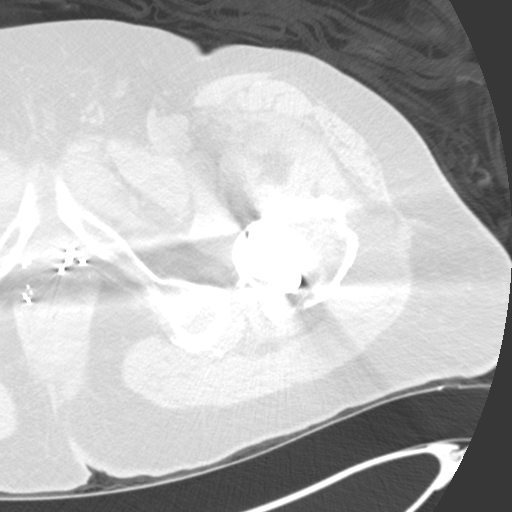
[im 36/57  soft-tissue]
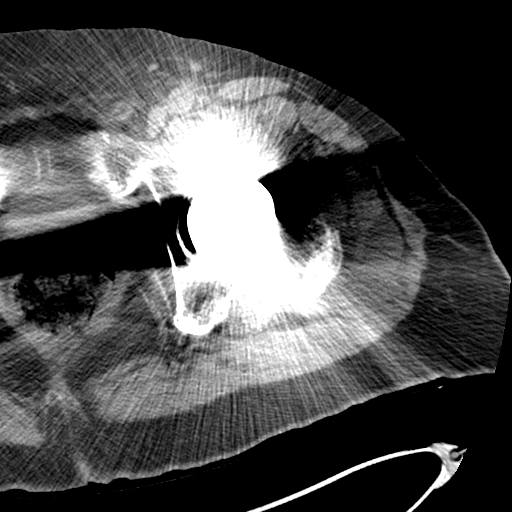
[im 36/57  lung]
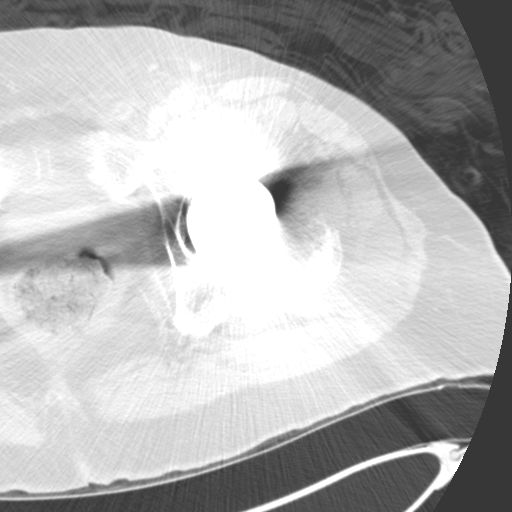
[im 43/57  soft-tissue]
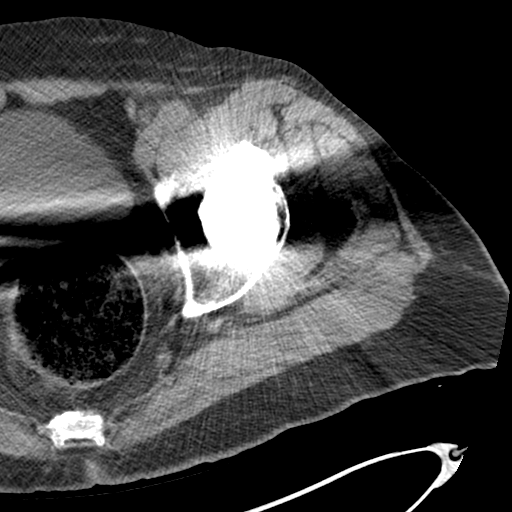
[im 43/57  lung]
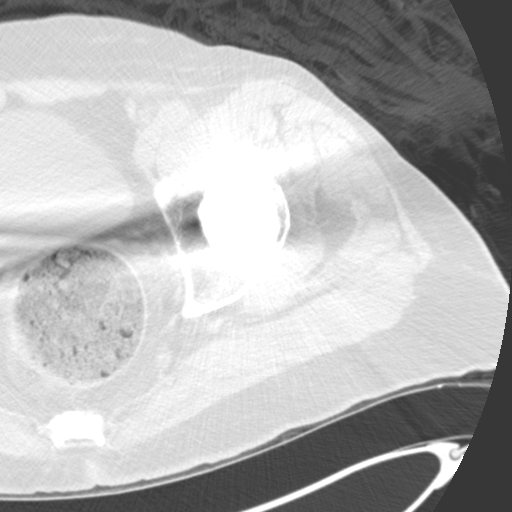
[im 50/57  soft-tissue]
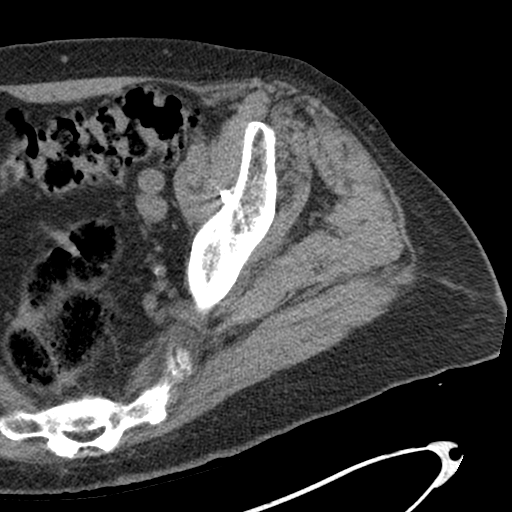
[im 50/57  lung]
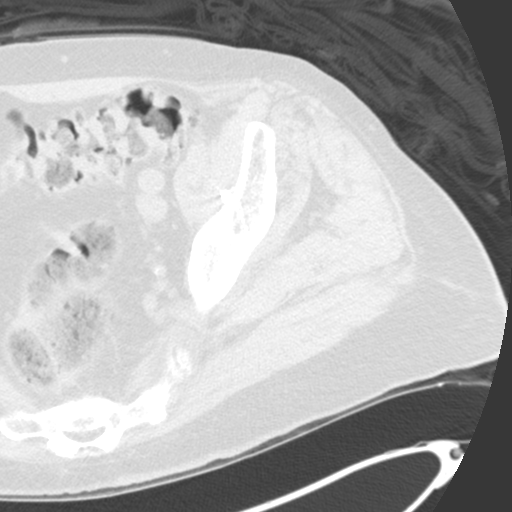

[Series 7: coronal bone · coronal · 0.57mm/px · 3 of 121 slices shown, 4 images]
[im 41/121  soft-tissue]
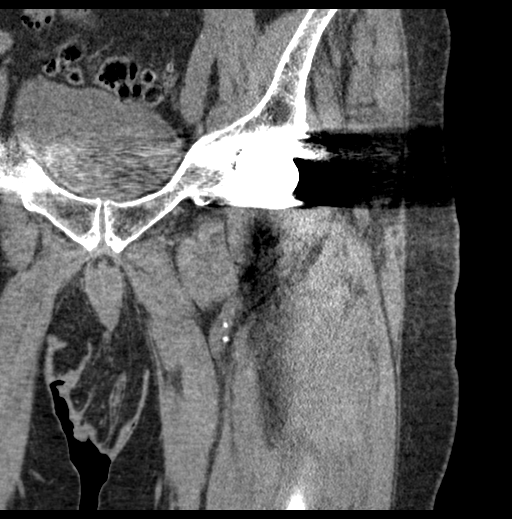
[im 54/121  soft-tissue]
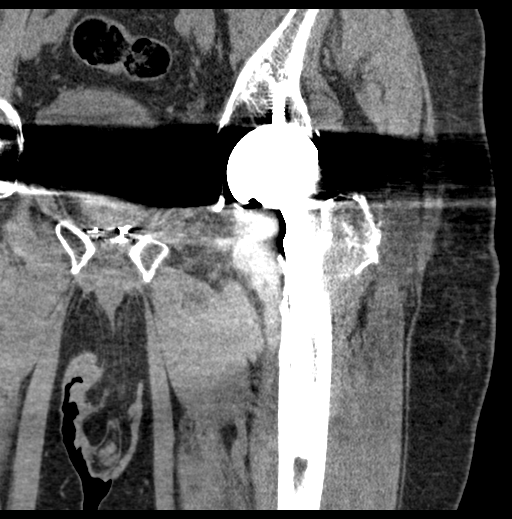
[im 54/121  bone]
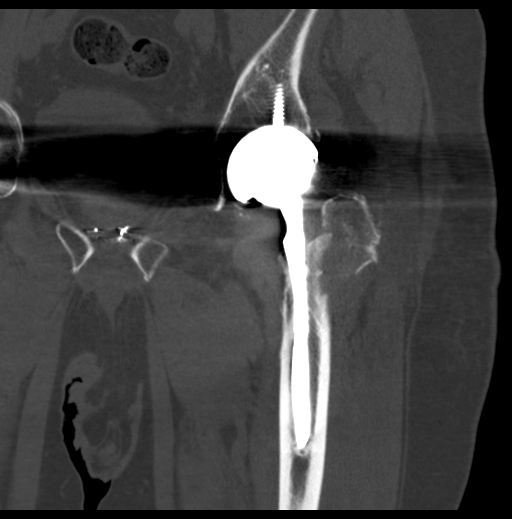
[im 67/121  soft-tissue]
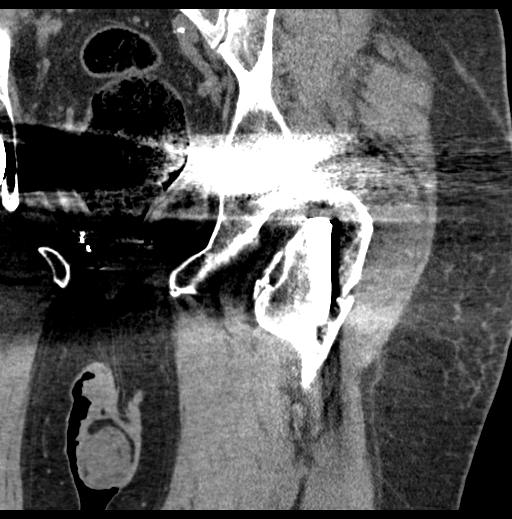

[Series 10: sagittal bone · sagittal · 0.58mm/px · 1 of 112 slices shown, 2 images]
[im 56/112  soft-tissue]
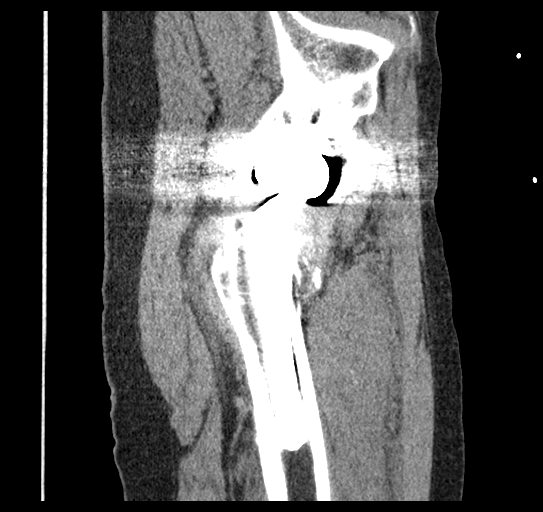
[im 56/112  bone]
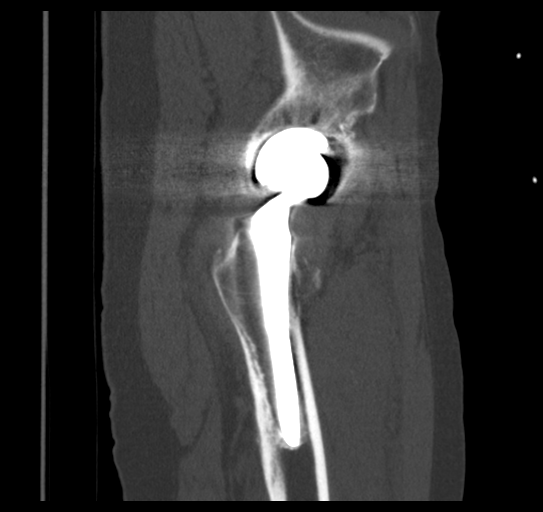

[11 of 46 positions shown; findings below may reference images not displayed]

FINDINGS: Bones/Joint/Cartilage

Acute greater trochanteric and periprosthetic fracture on the left
extending along the proximal 5.5 cm of the stem of the femoral
component of the left total hip prosthesis. Mild comminution.

Abnormal transverse sclerosis in the sacrum at approximately the S3
level suspicious for a sacral insufficiency fracture.

Spurring along the lesser trochanter appears chronic.

Ligaments

Suboptimally assessed by CT.

Muscles and Tendons

Hematoma in the proximal vastus musculature.

Soft tissues

Fluid in the trochanteric bursa. Common femoral artery
atherosclerotic calcification. Seed implants in the prostate gland.
IMPRESSION: 1. Acute mildly comminuted greater trochanteric and periprosthetic
fracture on the left, extending along the proximal 5.5 cm of the
stem of the femoral component of the hip implant. Associated
hematoma in the proximal vastus musculature and fluid in the
trochanteric bursa.
2. Sclerotic band transversely in the sacrum at about the S3 level
suspicious for sacral insufficiency fracture. This is either
subacute or chronic.

## 2018-01-01 ENCOUNTER — Ambulatory Visit: Payer: Medicare Other | Admitting: Psychology

## 2018-01-01 ENCOUNTER — Encounter: Payer: Self-pay | Admitting: Psychology

## 2018-01-01 ENCOUNTER — Ambulatory Visit (INDEPENDENT_AMBULATORY_CARE_PROVIDER_SITE_OTHER): Payer: Medicare Other | Admitting: Psychology

## 2018-01-01 DIAGNOSIS — F688 Other specified disorders of adult personality and behavior: Secondary | ICD-10-CM

## 2018-01-01 DIAGNOSIS — G3184 Mild cognitive impairment, so stated: Secondary | ICD-10-CM

## 2018-01-01 DIAGNOSIS — R413 Other amnesia: Secondary | ICD-10-CM

## 2018-01-01 NOTE — Progress Notes (Addendum)
   Neuropsychology Note  Andrew Ramos completed 60 minutes of neuropsychological testing with technician, Milana Kidney, BS, under the supervision of Dr. Macarthur Critchley, Licensed Psychologist. The patient did not appear overtly distressed by the testing session, per behavioral observation or via self-report to the technician. Rest breaks were offered.   Clinical Decision Making: In considering the patient's current level of functioning, level of presumed impairment, nature of symptoms, emotional and behavioral responses during the interview, level of literacy, and observed level of motivation/effort, a battery of tests was selected and communicated to the psychometrician.  Communication between the psychologist and technician was ongoing throughout the testing session and changes were made as deemed necessary based on patient performance on testing, technician observations and additional pertinent factors such as those listed above.  Andrew Ramos will return within approximately 2 weeks for an interactive feedback session with Dr. Si Raider at which time his test performances, clinical impressions and treatment recommendations will be reviewed in detail. The patient understands he can contact our office should he require our assistance before this time.  15 minutes spent performing neuropsychological evaluation services/clinical decision making (psychologist). [CPT 66294] 60 minutes spent face-to-face with patient administering standardized tests, 30 minutes spent scoring (technician). [CPT [id number], 76546]  Full report to follow.
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
## 2018-01-02 DIAGNOSIS — M7062 Trochanteric bursitis, left hip: Secondary | ICD-10-CM | POA: Diagnosis not present

## 2018-01-02 DIAGNOSIS — S72112D Displaced fracture of greater trochanter of left femur, subsequent encounter for closed fracture with routine healing: Secondary | ICD-10-CM | POA: Diagnosis not present

## 2018-01-11 DIAGNOSIS — M25552 Pain in left hip: Secondary | ICD-10-CM | POA: Diagnosis not present

## 2018-01-17 DIAGNOSIS — M25552 Pain in left hip: Secondary | ICD-10-CM | POA: Diagnosis not present

## 2018-01-18 DIAGNOSIS — H40021 Open angle with borderline findings, high risk, right eye: Secondary | ICD-10-CM | POA: Diagnosis not present

## 2018-01-18 DIAGNOSIS — H40012 Open angle with borderline findings, low risk, left eye: Secondary | ICD-10-CM | POA: Diagnosis not present

## 2018-01-19 DIAGNOSIS — M25552 Pain in left hip: Secondary | ICD-10-CM | POA: Diagnosis not present

## 2018-01-22 DIAGNOSIS — M25552 Pain in left hip: Secondary | ICD-10-CM | POA: Diagnosis not present

## 2018-01-24 DIAGNOSIS — M25552 Pain in left hip: Secondary | ICD-10-CM | POA: Diagnosis not present

## 2018-01-29 DIAGNOSIS — M25552 Pain in left hip: Secondary | ICD-10-CM | POA: Diagnosis not present

## 2018-02-01 DIAGNOSIS — M25552 Pain in left hip: Secondary | ICD-10-CM | POA: Diagnosis not present

## 2018-02-05 DIAGNOSIS — M25552 Pain in left hip: Secondary | ICD-10-CM | POA: Diagnosis not present

## 2018-02-07 ENCOUNTER — Other Ambulatory Visit: Payer: Self-pay | Admitting: Family Medicine

## 2018-02-08 DIAGNOSIS — M25552 Pain in left hip: Secondary | ICD-10-CM | POA: Diagnosis not present

## 2018-02-13 DIAGNOSIS — M7062 Trochanteric bursitis, left hip: Secondary | ICD-10-CM | POA: Diagnosis not present

## 2018-02-13 DIAGNOSIS — M25552 Pain in left hip: Secondary | ICD-10-CM | POA: Diagnosis not present
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
## 2018-02-14 DIAGNOSIS — M0609 Rheumatoid arthritis without rheumatoid factor, multiple sites: Secondary | ICD-10-CM | POA: Diagnosis not present

## 2018-02-14 DIAGNOSIS — E669 Obesity, unspecified: Secondary | ICD-10-CM | POA: Diagnosis not present

## 2018-02-14 DIAGNOSIS — M150 Primary generalized (osteo)arthritis: Secondary | ICD-10-CM | POA: Diagnosis not present

## 2018-02-14 DIAGNOSIS — M353 Polymyalgia rheumatica: Secondary | ICD-10-CM | POA: Diagnosis not present

## 2018-02-14 DIAGNOSIS — Z6831 Body mass index (BMI) 31.0-31.9, adult: Secondary | ICD-10-CM | POA: Diagnosis not present

## 2018-02-14 DIAGNOSIS — M810 Age-related osteoporosis without current pathological fracture: Secondary | ICD-10-CM | POA: Diagnosis not present

## 2018-02-14 DIAGNOSIS — Z79899 Other long term (current) drug therapy: Secondary | ICD-10-CM | POA: Diagnosis not present

## 2018-02-14 DIAGNOSIS — M25552 Pain in left hip: Secondary | ICD-10-CM | POA: Diagnosis not present

## 2018-02-15 ENCOUNTER — Ambulatory Visit (INDEPENDENT_AMBULATORY_CARE_PROVIDER_SITE_OTHER): Payer: Medicare Other | Admitting: Psychology

## 2018-02-15 ENCOUNTER — Encounter: Payer: Self-pay | Admitting: Psychology

## 2018-02-15 DIAGNOSIS — R413 Other amnesia: Secondary | ICD-10-CM | POA: Diagnosis not present

## 2018-02-15 DIAGNOSIS — G3184 Mild cognitive impairment, so stated: Secondary | ICD-10-CM

## 2018-02-15 NOTE — Patient Instructions (Signed)
Results of your cognitive evaluation were largely within normal limits for your age. However, you did have some difficulty with complex attention and shifting attention back and forth. Your test results and current level of functioning do NOT warrant a diagnosis of dementia. However, a diagnosis of Mild Cognitive Impairment (MCI) is appropriate at this time. Mild cognitive impairment may be due to changes to the small blood vessels in the brain (this can happen with aging and with things like high blood pressure, high cholesterol, diabetes).   I have the following recommendations:  Optimal control of vascular risk factors (including safe cardiovascular exercise and adherence to dietary recommendations).   Continue to participate in activities which provide mental stimulation and social interaction.  Compensatory strategies for memory deficits (e.g., reminders/alerts, calendars, daily logs)  Neuropsychological re-assessment in one year is recommended in order to monitor cognitive status, track any progression of symptoms and further assist with treatment planning.  Limit driving to familiar/local areas. Avoid high traffic areas and avoid nighttime driving.

## 2018-02-16 DIAGNOSIS — M25552 Pain in left hip: Secondary | ICD-10-CM | POA: Diagnosis not present

## 2018-02-19 DIAGNOSIS — M25552 Pain in left hip: Secondary | ICD-10-CM | POA: Diagnosis not present

## 2018-02-22 DIAGNOSIS — D485 Neoplasm of uncertain behavior of skin: Secondary | ICD-10-CM | POA: Diagnosis not present

## 2018-02-22 DIAGNOSIS — D2272 Melanocytic nevi of left lower limb, including hip: Secondary | ICD-10-CM | POA: Diagnosis not present

## 2018-02-22 DIAGNOSIS — Z800 Family history of malignant neoplasm of digestive organs: Secondary | ICD-10-CM | POA: Diagnosis not present

## 2018-02-22 DIAGNOSIS — D2271 Melanocytic nevi of right lower limb, including hip: Secondary | ICD-10-CM | POA: Diagnosis not present

## 2018-02-22 DIAGNOSIS — Z86018 Personal history of other benign neoplasm: Secondary | ICD-10-CM | POA: Diagnosis not present

## 2018-02-22 DIAGNOSIS — C44319 Basal cell carcinoma of skin of other parts of face: Secondary | ICD-10-CM | POA: Diagnosis not present

## 2018-02-22 DIAGNOSIS — D225 Melanocytic nevi of trunk: Secondary | ICD-10-CM | POA: Diagnosis not present

## 2018-02-22 DIAGNOSIS — Z85828 Personal history of other malignant neoplasm of skin: Secondary | ICD-10-CM | POA: Diagnosis not present

## 2018-02-22 DIAGNOSIS — Z85820 Personal history of malignant melanoma of skin: Secondary | ICD-10-CM | POA: Diagnosis not present

## 2018-02-22 DIAGNOSIS — L821 Other seborrheic keratosis: Secondary | ICD-10-CM | POA: Diagnosis not present

## 2018-02-23 DIAGNOSIS — M25552 Pain in left hip: Secondary | ICD-10-CM | POA: Diagnosis not present

## 2018-02-27 DIAGNOSIS — M25552 Pain in left hip: Secondary | ICD-10-CM | POA: Diagnosis not present

## 2018-02-28 DIAGNOSIS — M21622 Bunionette of left foot: Secondary | ICD-10-CM | POA: Diagnosis not present

## 2018-02-28 DIAGNOSIS — M71572 Other bursitis, not elsewhere classified, left ankle and foot: Secondary | ICD-10-CM | POA: Diagnosis not present

## 2018-03-02 ENCOUNTER — Other Ambulatory Visit: Payer: Self-pay

## 2018-03-02 ENCOUNTER — Ambulatory Visit (INDEPENDENT_AMBULATORY_CARE_PROVIDER_SITE_OTHER): Payer: Medicare Other | Admitting: Neurology

## 2018-03-02 ENCOUNTER — Encounter: Payer: Self-pay | Admitting: Neurology

## 2018-03-02 VITALS — BP 138/84 | HR 65 | Ht 72.0 in | Wt 228.0 lb

## 2018-03-02 DIAGNOSIS — R413 Other amnesia: Secondary | ICD-10-CM | POA: Diagnosis not present

## 2018-03-02 DIAGNOSIS — G3184 Mild cognitive impairment, so stated: Secondary | ICD-10-CM | POA: Diagnosis not present

## 2018-03-02 DIAGNOSIS — M25552 Pain in left hip: Secondary | ICD-10-CM | POA: Diagnosis not present

## 2018-03-02 MED ORDER — DONEPEZIL HCL 5 MG PO TABS: 5.0000 mg | ORAL_TABLET | Freq: Every day | ORAL | 3 refills | Status: DC

## 2018-03-02 NOTE — Patient Instructions (Addendum)
1. Continue Donepezil 5mg  daily 2. Proceed with Couples Therapy as scheduled 3. Schedule repeat Neurocognitive testing with Dr. Si Raider in a year, follow-up after testing  FALL PRECAUTIONS: Be cautious when walking. Scan the area for obstacles that may increase the risk of trips and falls. When getting up in the mornings, sit up at the edge of the bed for a few minutes before getting out of bed. Consider elevating the bed at the head end to avoid drop of blood pressure when getting up. Walk always in a well-lit room (use night lights in the walls). Avoid area rugs or power cords from appliances in the middle of the walkways. Use a walker or a cane if necessary and consider physical therapy for balance exercise. Get your eyesight checked regularly.  RECOMMENDATIONS FOR ALL PATIENTS WITH MEMORY PROBLEMS: 1. Continue to exercise (Recommend 30 minutes of walking everyday, or 3 hours every week) 2. Increase social interactions - continue going to [place] and enjoy social gatherings with friends and family 3. Eat healthy, avoid fried foods and eat more fruits and vegetables 4. Maintain adequate blood pressure, blood sugar, and blood cholesterol level. Reducing the risk of stroke and cardiovascular disease also helps promoting better memory. 5. Avoid stressful situations. Live a simple life and avoid aggravations. Organize your time and prepare for the next day in anticipation. 6. Sleep well, avoid any interruptions of sleep and avoid any distractions in the bedroom that may interfere with adequate sleep quality 7. Avoid sugar, avoid sweets as there is a strong link between excessive sugar intake, diabetes, and cognitive impairment The Mediterranean diet has been shown to help patients reduce the risk of progressive memory disorders and reduces cardiovascular risk. This includes eating fish, eat fruits and green leafy vegetables, nuts like almonds and hazelnuts, walnuts, and also use olive oil. Avoid fast foods  and fried foods as much as possible. Avoid sweets and sugar as sugar use has been linked to worsening of memory function.

## 2018-03-02 NOTE — Progress Notes (Signed)
NEUROLOGY FOLLOW UP OFFICE NOTE  Andrew Ramos [id number] Dec 07, 1938  HISTORY OF PRESENT ILLNESS: I had the pleasure of seeing Andrew Ramos in follow-up in the neurology clinic on 03/02/2018.  The patient was last seen 5 months ago for worsening memory and increasing irritability. He is alone in the office today. 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 Results and level of functioning did not warrant a diagnosis of dementia. No signs of a primary psychiatric disorder. Increased irritability/mood changes are likely related to MCI and frontal-subcortical involvement. He is taking Aricept 5mg  daily without side effects. He denies getting lost driving. He denies missing medications. His wife is in charge of finances. Couples therapy has been recommended, they are scheduled to start soon.  He denies any headaches, dizziness, vision changes, focal numbness/tingling, no falls. He has left hip bursitis and has been doing PT.  HPI 09/23/2016: This is a pleasant 84 yo RH man with a history of hypertension, hyperlipidemia, prostate cancer, melanoma, presenting for evaluation of worsening memory and personality changes. Andrew Ramos himself thinks that he is doing okay, but "people keep telling me I have too many things to think about." He only mostly notices memory problems when he is in a hurry. He states he occasionally forgets conversations but feels this is due to his poor hearing. He denies getting lost driving, no missed medications. He has missed some bill payments a couple of years ago, none recently. His son states he is not nearly as sharp as he once was, he misplaces things frequently and asks the same questions repeatedly. He relates what the patient's wife has told him, that he is more argumentative. He has left the stove burner on at least twice in the past  couple of weeks. He has walked away from the sink running. They have noticed more dents on the car, which the patient is unsure how they happened. They both wonder if these were from parking lot incidents because he states he has never hit another car. No difficulties with dressing/bathing independently, hygiene is good. On review of PCP notes where wife was present, she reported he forgets to zip his pants, he forgets things he has offered his wife. He brings the wrong utensils to eat with. He forgets simple things his wife asks for. He states that she give him too many instructions. He loses temper with his wife frequently, they are fighting more and she is tired of people telling her to be more patient with him. He was noted to have a blunt affect with paranoid thought content on his PCP visit. MMSE in 06/2016 was 30/30. He was started on Donepezil 5mg  daily, which he is tolerating without side effects.   He denies any headaches, dizziness, diplopia, dysarthria, dysphagia, neck/back pain, focal numbness/tingling/weakness, bladder dysfunction. No anosmia, tremors, no falls. He has occasional constipation. His brother who is 7 years younger than him was diagnosed with dementia after he got lost driving and could not be found for 2 weeks. He denies any significant head injuries, he drinks alcohol very occasionally.   Diagnostic Data: MRI brain with and without contrast did not show any acute changes. There was moderate diffuse volume loss and mild chronic microvascular disease.  PAST MEDICAL HISTORY: Past Medical History:  Diagnosis Date  . Colon polyps    Tubular Adenoma 2005  . Diverticulosis   . Hepatitis    pt told by  [facility] has hepatitis antibodies in blood   . Hyperlipidemia   . Hypertension   . Melanoma ([place])   . Nonmelanoma skin cancer   . Osteoarthritis    right hip- none since hip replacement  . Polymyalgia rheumatica ([place])   . Polyposis of colon   . Prostate cancer ([place]) 2007    s/p radiation seed implants  . Prostate cancer ([place])   . Thyroid disease sees dr altzhimer for yearly   nodules    MEDICATIONS: Current Outpatient Medications on File Prior to Visit  Medication Sig Dispense Refill  . alendronate (FOSAMAX) 70 MG tablet Take 70 mg by mouth every 7 (seven) days. Take with a full glass of water on an empty stomach every Friday    . aspirin 81 MG tablet Take 1 tablet (81 mg total) by mouth daily. 30 tablet   . atorvastatin (LIPITOR) 40 MG tablet TAKE 1 TABLET DAILY 90 tablet 1  . Calcium Carbonate-Vitamin D (CALCIUM 600 + D PO) Take 1 tablet by mouth every morning.     . Cholecalciferol (VITAMIN D3) 1000 UNITS tablet Take 1,000 Units by mouth every morning.     . donepezil (ARICEPT) 5 MG tablet TAKE 1 TABLET AT BEDTIME 90 tablet 1  . feeding supplement, ENSURE ENLIVE, (ENSURE ENLIVE) LIQD Take 237 mLs by mouth 2 (two) times daily between meals. 270 mL 0  . folic acid (FOLVITE) 1 MG tablet Take 1 mg by mouth daily.    Marland Kitchen glucose blood (FREESTYLE LITE) test strip Check blood sugar once daily. Dx 250.00 100 each 12  . lisinopril-hydrochlorothiazide (PRINZIDE,ZESTORETIC) 10-12.5 MG tablet TAKE 1 TABLET DAILY 90 tablet 0  . methotrexate (RHEUMATREX) 2.5 MG tablet Take 12.5 mg by mouth every Wednesday.     . Multiple Vitamins-Minerals (CENTRUM SILVER ULTRA MENS) TABS Take 1 tablet by mouth every evening.     . polyethylene glycol powder (GLYCOLAX/MIRALAX) powder Take 17 g by mouth daily as needed for moderate constipation. 1 scoop daily     . terazosin (HYTRIN) 1 MG capsule TAKE 1 CAPSULE AT BEDTIME 90 capsule 1  . venlafaxine XR (EFFEXOR-XR) 75 MG 24 hr capsule TAKE 1 CAPSULE DAILY WITH BREAKFAST 90 capsule 1   No current facility-administered medications on file prior to visit.     ALLERGIES: No Known Allergies  FAMILY HISTORY: Family History  Problem Relation Age of Onset  . Coronary artery disease Mother 58  . Heart disease Mother 36       MI  . Coronary  artery disease Father 41  . Pancreatic cancer Father 84       deceased 6  . Pancreatic cancer Brother 38       deceased 79; smoker  . Colon cancer Neg Hx   . Colon polyps Neg Hx   . Esophageal cancer Neg Hx   . Kidney disease Neg Hx   . Diabetes Neg Hx   . Gallbladder disease Neg Hx     SOCIAL HISTORY: Social History   Socioeconomic History  . Marital status: Married    Spouse name: Not on file  . Number of children: 2  . Years of education: Not on file  . Highest education level: Not on file  Occupational History  . Occupation: retired Lobbyist: RETIRED  Social Needs  . Financial resource strain: Not on file  . Food insecurity:    Worry: Not on file    Inability: Not on file  . Transportation needs:  Medical: Not on file    Non-medical: Not on file  Tobacco Use  . Smoking status: Never Smoker  . Smokeless tobacco: Never Used  Substance and Sexual Activity  . Alcohol use: Yes    Alcohol/week: 0.0 oz    Comment: occasional  . Drug use: No  . Sexual activity: Yes    Partners: Female  Lifestyle  . Physical activity:    Days per week: Not on file    Minutes per session: Not on file  . Stress: Not on file  Relationships  . Social connections:    Talks on phone: Not on file    Gets together: Not on file    Attends religious service: Not on file    Active member of club or organization: Not on file    Attends meetings of clubs or organizations: Not on file    Relationship status: Not on file  . Intimate partner violence:    Fear of current or ex partner: Not on file    Emotionally abused: Not on file    Physically abused: Not on file    Forced sexual activity: Not on file  Other Topics Concern  . Not on file  Social History Narrative   Exercise--no    REVIEW OF SYSTEMS: Constitutional: No fevers, chills, or sweats, no generalized fatigue, change in appetite Eyes: No visual changes, double vision, eye pain Ear, nose and throat: No hearing  loss, ear pain, nasal congestion, sore throat Cardiovascular: No chest pain, palpitations Respiratory:  No shortness of breath at rest or with exertion, wheezes GastrointestinaI: No nausea, vomiting, diarrhea, abdominal pain, fecal incontinence Genitourinary:  No dysuria, urinary retention or frequency Musculoskeletal:  No neck pain, back pain Integumentary: No rash, pruritus, skin lesions Neurological: as above Psychiatric: No depression, insomnia, anxiety Endocrine: No palpitations, fatigue, diaphoresis, mood swings, change in appetite, change in weight, increased thirst Hematologic/Lymphatic:  No anemia, purpura, petechiae. Allergic/Immunologic: no itchy/runny eyes, nasal congestion, recent allergic reactions, rashes  PHYSICAL EXAM: Vitals:   03/02/18 0816  BP: 138/84  Pulse: 65  SpO2: 97%   General: No acute distress Head:  Normocephalic/atraumatic Neck: supple, no paraspinal tenderness, full range of motion Heart:  Regular rate and rhythm Lungs:  Clear to auscultation bilaterally Back: No paraspinal tenderness Skin/Extremities: No rash, no edema Neurological Exam: alert and oriented to person, place, and time. No aphasia or dysarthria. Fund of knowledge is appropriate.  Recent and remote memory are intact. 3/3 delayed recall. Attention and concentration are normal.    Able to name objects and repeat phrases. Cranial nerves: Pupils equal, round, reactive to light. Extraocular movements intact with no nystagmus. Visual fields full. Facial sensation intact. No facial asymmetry. Tongue, uvula, palate midline.  Motor: Bulk and tone normal, muscle strength 5/5 throughout with no pronator drift.  Sensation to light touch intact.  No extinction to double simultaneous stimulation.  Deep tendon reflexes +1 throughout, toes downgoing.  Finger to nose testing intact.  Gait slow and cautious favoring left leg (similar to prior), no ataxia.  IMPRESSION: This is a pleasant 84 yo RH man with  hypertension, hyperlipidemia, prostate cancer, melanoma, who presented with worsening memory and increased irritability at home. MRI brain unremarkable. TSH and B12 normal. Recent Neuropsychological testing indicated non-amnestic mild cognitive impairment (dysexecutive features - likely vascular cognitive impairment). Results and level of functioning did not warrant a diagnosis of dementia. No signs of a primary psychiatric disorder. Increased irritability/mood changes are likely related to MCI and frontal-subcortical involvement.  We discussed Dr. Marcia Brash recommendations, including optimal control of vascular risk factors, proceeding with couples therapy. Continue Donepezil 5mg  daily. We discussed repeating Neuropsychological testing in 1 year to monitor cognitive status. Continue to monitor driving. He will follow-up in 1 year and knows to call for any changes.   Thank you for allowing me to participate in his care.  Please do not hesitate to call for any questions or concerns.  The duration of this appointment visit was 20 minutes of face-to-face time with the patient.  Greater than 50% of this time was spent in counseling, explanation of diagnosis, planning of further management, and coordination of care.   Ellouise Newer, M.D.   CC: Dr. Cheri Rous

## 2018-03-07 DIAGNOSIS — M71572 Other bursitis, not elsewhere classified, left ankle and foot: Secondary | ICD-10-CM | POA: Diagnosis not present

## 2018-03-07 DIAGNOSIS — M21612 Bunion of left foot: Secondary | ICD-10-CM | POA: Diagnosis not present

## 2018-03-19 ENCOUNTER — Ambulatory Visit: Payer: Self-pay | Admitting: Podiatry

## 2018-03-21 ENCOUNTER — Encounter: Payer: Self-pay | Admitting: Internal Medicine

## 2018-03-21 ENCOUNTER — Ambulatory Visit (INDEPENDENT_AMBULATORY_CARE_PROVIDER_SITE_OTHER): Payer: Medicare Other | Admitting: Internal Medicine

## 2018-03-21 ENCOUNTER — Ambulatory Visit: Payer: Self-pay | Admitting: Podiatry

## 2018-03-21 VITALS — BP 144/70 | HR 89 | Temp 97.8°F | Resp 16 | Ht 72.0 in | Wt 228.2 lb

## 2018-03-21 DIAGNOSIS — S4992XA Unspecified injury of left shoulder and upper arm, initial encounter: Secondary | ICD-10-CM | POA: Diagnosis not present

## 2018-03-21 NOTE — Progress Notes (Signed)
Pre visit review using our clinic review tool, if applicable. No additional management support is needed unless otherwise documented below in the visit note. 

## 2018-03-21 NOTE — Progress Notes (Signed)
Subjective:    Patient ID: Andrew Ramos, male    DOB: August 22, 1932, 84 y.o.   MRN: [id number]  DOS:  03/21/2018 Type of visit - description : Acute visit Interval history: Incident happened 4 days ago.  He hit his left arm w/ a door.  At that time he had only mild bleeding and no pain. He developed a wound, he has been keeping it covered and dry, using a OTC antibiotic ointment No other concerns  Review of Systems Denies fever chills No discharge  Past Medical History:  Diagnosis Date  . Colon polyps    Tubular Adenoma 2005  . Diverticulosis   . Hepatitis    [facility] told by [facility] has hepatitis antibodies in blood   . Hyperlipidemia   . Hypertension   . Melanoma ([place])   . Nonmelanoma skin cancer   . Osteoarthritis    right hip- none since hip replacement  . Polymyalgia rheumatica ([place])   . Polyposis of colon   . Prostate cancer ([place]) 2007   s/p radiation seed implants  . Prostate cancer ([place])   . Thyroid disease sees dr altzhimer for yearly   nodules    Past Surgical History:  Procedure Laterality Date  . CATARACT EXTRACTION    . POLYPECTOMY    . TONSILLECTOMY  as child  . TOTAL HIP ARTHROPLASTY  08/27/2008   left  . TOTAL HIP ARTHROPLASTY  06/12/2012   Procedure: TOTAL HIP ARTHROPLASTY ANTERIOR APPROACH;  Surgeon: Mauri Pole, MD;  Location: [facility];  Service: Orthopedics;  Laterality: Right;  . TOTAL KNEE ARTHROPLASTY  12/31/2008   left    Social History   Socioeconomic History  . Marital status: Married    Spouse name: Not on file  . Number of children: 2  . Years of education: Not on file  . Highest education level: Not on file  Occupational History  . Occupation: retired Lobbyist: RETIRED  Social Needs  . Financial resource strain: Not on file  . Food insecurity:    Worry: Not on file    Inability: Not on file  . Transportation needs:    Medical: Not on file    Non-medical: Not on file  Tobacco Use  . Smoking status: Never Smoker    . Smokeless tobacco: Never Used  Substance and Sexual Activity  . Alcohol use: Yes    Alcohol/week: 0.0 oz    Comment: occasional  . Drug use: No  . Sexual activity: Yes    Partners: Female  Lifestyle  . Physical activity:    Days per week: Not on file    Minutes per session: Not on file  . Stress: Not on file  Relationships  . Social connections:    Talks on phone: Not on file    Gets together: Not on file    Attends religious service: Not on file    Active member of club or organization: Not on file    Attends meetings of clubs or organizations: Not on file    Relationship status: Not on file  . Intimate partner violence:    Fear of current or ex partner: Not on file    Emotionally abused: Not on file    Physically abused: Not on file    Forced sexual activity: Not on file  Other Topics Concern  . Not on file  Social History Narrative   Exercise--no      Allergies as of 03/21/2018  No Known Allergies     Medication List        Accurate as of 03/21/18 11:59 PM. Always use your most recent med list.          alendronate 70 MG tablet Commonly known as:  FOSAMAX Take 70 mg by mouth every 7 (seven) days. Take with a full glass of water on an empty stomach every Friday   aspirin 81 MG tablet Take 1 tablet (81 mg total) by mouth daily.   atorvastatin 40 MG tablet Commonly known as:  LIPITOR TAKE 1 TABLET DAILY   CALCIUM 600 + D PO Take 1 tablet by mouth every morning.   CENTRUM SILVER ULTRA MENS Tabs Take 1 tablet by mouth every evening.   cholecalciferol 1000 units tablet Commonly known as:  VITAMIN D Take 1,000 Units by mouth every morning.   donepezil 5 MG tablet Commonly known as:  ARICEPT Take 1 tablet (5 mg total) by mouth at bedtime.   feeding supplement (ENSURE ENLIVE) Liqd Take 237 mLs by mouth 2 (two) times daily between meals.   folic acid 1 MG tablet Commonly known as:  FOLVITE Take 1 mg by mouth daily.   glucose blood test  strip Commonly known as:  FREESTYLE LITE Check blood sugar once daily. Dx 250.00   lisinopril-hydrochlorothiazide 10-12.5 MG tablet Commonly known as:  PRINZIDE,ZESTORETIC TAKE 1 TABLET DAILY   methotrexate 2.5 MG tablet Commonly known as:  RHEUMATREX Take 12.5 mg by mouth every Wednesday.   polyethylene glycol powder powder Commonly known as:  GLYCOLAX/MIRALAX Take 17 g by mouth daily as needed for moderate constipation. 1 scoop daily   terazosin 1 MG capsule Commonly known as:  HYTRIN TAKE 1 CAPSULE AT BEDTIME   venlafaxine XR 75 MG 24 hr capsule Commonly known as:  EFFEXOR-XR TAKE 1 CAPSULE DAILY WITH BREAKFAST          Objective:   Physical Exam BP (!) 144/70 (BP Location: Right Arm, Patient Position: Sitting, Cuff Size: Small)   Pulse 89   Temp 97.8 F (36.6 C) (Oral)   Resp 16   Ht 6' (1.829 m)   Wt 228 lb 4 oz (103.5 kg)   SpO2 97%   BMI 30.96 kg/m  General:   Well developed, NAD, see BMI.  HEENT:  Normocephalic . Face symmetric, atraumatic  Skin: Term, has a wound, see picture Neurologic:  alert & oriented X3.  Speech normal, gait appropriate for age and unassisted Psych--  Cognition and judgment appear intact.  Cooperative with normal attention span and concentration.  Behavior appropriate. No anxious or depressed appearing.        Assessment & Plan:    84 year old male, PMH includes osteoporosis,high cholesterol, BPH, melanoma, prostate cancer, mild cognitive impairment presents with: Superficial skin injury, left arm. Up-to-date on his tetanus shot, wound looks very well with no evidence of infection, recommend to continue keeping it covered with an antibiotic ointment, call if signs of infection, the excessive skin should fall  by itself. Patient verbalized understanding.

## 2018-03-21 NOTE — Patient Instructions (Signed)
Continue taking care of the wound the way you have been doing it for the next few days.  Continue using the antibiotic ointment  If you develop any redness, swelling, discharge: Please call the office  The excessive skin should fall off in several days.

## 2018-03-26 ENCOUNTER — Ambulatory Visit (INDEPENDENT_AMBULATORY_CARE_PROVIDER_SITE_OTHER): Payer: Medicare Other

## 2018-03-26 ENCOUNTER — Ambulatory Visit (INDEPENDENT_AMBULATORY_CARE_PROVIDER_SITE_OTHER): Payer: Medicare Other | Admitting: Podiatry

## 2018-03-26 ENCOUNTER — Other Ambulatory Visit: Payer: Self-pay | Admitting: Podiatry

## 2018-03-26 DIAGNOSIS — M21622 Bunionette of left foot: Secondary | ICD-10-CM

## 2018-03-26 DIAGNOSIS — M21619 Bunion of unspecified foot: Secondary | ICD-10-CM

## 2018-03-26 DIAGNOSIS — M2042 Other hammer toe(s) (acquired), left foot: Secondary | ICD-10-CM

## 2018-03-26 NOTE — Patient Instructions (Signed)
Pre-Operative Instructions  Congratulations, you have decided to take an important step towards improving your quality of life.  You can be assured that the doctors and staff at [facility] will be with you every step of the way.  Here are some important things you should know:  1. Plan to be at the surgery center/hospital at least 1 (one) hour prior to your scheduled time, unless otherwise directed by the surgical center/hospital staff.  You must have a responsible adult accompany you, remain during the surgery and drive you home.  Make sure you have directions to the surgical center/hospital to ensure you arrive on time. 2. If you are having surgery at [facility] or [facility], you will need a copy of your medical history and physical form from your family physician within one month prior to the date of surgery. We will give you a form for your primary physician to complete.  3. We make every effort to accommodate the date you request for surgery.  However, there are times where surgery dates or times have to be moved.  We will contact you as soon as possible if a change in schedule is required.   4. No aspirin/ibuprofen for one week before surgery.  If you are on aspirin, any non-steroidal anti-inflammatory medications (Mobic, Aleve, Ibuprofen) should not be taken seven (7) days prior to your surgery.  You make take Tylenol for pain prior to surgery.  5. Medications - If you are taking daily heart and blood pressure medications, seizure, reflux, allergy, asthma, anxiety, pain or diabetes medications, make sure you notify the surgery center/hospital before the day of surgery so they can tell you which medications you should take or avoid the day of surgery. 6. No food or drink after midnight the night before surgery unless directed otherwise by surgical center/hospital staff. 7. No alcoholic beverages 24-hours prior to surgery.  No smoking 24-hours prior or 24-hours after  surgery. 8. Wear loose pants or shorts. They should be loose enough to fit over bandages, boots, and casts. 9. Don't wear slip-on shoes. Sneakers are preferred. 10. Bring your boot with you to the surgery center/hospital.  Also bring crutches or a walker if your physician has prescribed it for you.  If you do not have this equipment, it will be provided for you after surgery. 11. If you have not been contacted by the surgery center/hospital by the day before your surgery, call to confirm the date and time of your surgery. 12. Leave-time from work may vary depending on the type of surgery you have.  Appropriate arrangements should be made prior to surgery with your employer. 13. Prescriptions will be provided immediately following surgery by your doctor.  Fill these as soon as possible after surgery and take the medication as directed. Pain medications will not be refilled on weekends and must be approved by the doctor. 14. Remove nail polish on the operative foot and avoid getting pedicures prior to surgery. 15. Wash the night before surgery.  The night before surgery wash the foot and leg well with water and the antibacterial soap provided. Be sure to pay special attention to beneath the toenails and in between the toes.  Wash for at least three (3) minutes. Rinse thoroughly with water and dry well with a towel.  Perform this wash unless told not to do so by your physician.  Enclosed: 1 Ice pack (please put in freezer the night before surgery)   1 Hibiclens skin cleaner     Pre-op instructions  If you have any questions regarding the instructions, please do not hesitate to call our office.  [place]: [address], [place], [place] [address] -- [phone]  [place]: [address] , [place] [address] -- [phone]  [place]: [address]  [place], [place] [address] -- [phone]  [place]: [address], [address], [place],  [address] -- [phone]  Website:  [email or web address] 

## 2018-03-29 ENCOUNTER — Encounter: Payer: Self-pay | Admitting: *Deleted

## 2018-03-29 NOTE — Progress Notes (Signed)
   Subjective: 84 year old male presenting today as a new patient referred by Dr. Gershon Mussel with a chief complaint of a Tailor's bunion of the left foot that appeared two years ago. He is interested in surgical repair. Walking and wearing shoes increases the pain. He has not done anything at home for treatment. Patient is here for further evaluation and treatment.   Past Medical History:  Diagnosis Date  . Colon polyps    Tubular Adenoma 2005  . Diverticulosis   . Hepatitis    pt told by [facility] has hepatitis antibodies in blood   . Hyperlipidemia   . Hypertension   . Melanoma ([place])   . Nonmelanoma skin cancer   . Osteoarthritis    right hip- none since hip replacement  . Polymyalgia rheumatica ([place])   . Polyposis of colon   . Prostate cancer ([place]) 2007   s/p radiation seed implants  . Prostate cancer ([place])   . Thyroid disease sees dr altzhimer for yearly   nodules    Objective: Physical Exam General: The patient is alert and oriented x3 in no acute distress.  Dermatology: Skin is cool, dry and supple bilateral lower extremities. Negative for open lesions or macerations.  Vascular: Palpable pedal pulses bilaterally. No edema or erythema noted. Capillary refill within normal limits.  Neurological: Epicritic and protective threshold grossly intact bilaterally.   Musculoskeletal Exam: Clinical evidence of Tailor's bunion deformity noted to the respective foot. There is a moderate pain on palpation range of motion of the fifth MPJ. Hammertoe contracture deformity noted to digits 4-5 of the left foot   Radiographic Exam: Increased intermetatarsal angle to the fourth interspace of the respective foot. Prominent fifth metatarsal head. Joint spaces preserved. Hammertoe contracture deformity noted to the interphalangeal joints and MPJ of the respective hammertoe digits mentioned on clinical musculoskeletal exam.  Assessment: 1. Tailor's bunion deformity left lower extremity 2. Hammertoe  digits 4-5 left    Plan of Care:  1. Patient was evaluated. X-Rays reviewed.  2. Today we discussed the conservative versus surgical management of the presenting pathology. The patient opts for surgical management. All possible complications and details of the procedure were explained. All patient questions were answered. No guarantees were expressed or implied. 3. Authorization for surgery was initiated today. Surgery will consist of PIPJ arthroplasty 4th and 5th digits left, Tailor's bunionectomy with 5th metatarsal osteotomy left, MPJ capsulotomy 4th and 5th left.  4. Return to clinic one week post op.     Edrick Kins, DPM [facility]  Dr. Edrick Kins, [address]                                        [place], [place] [address]                Office [phone]  Fax [phone]

## 2018-04-04 ENCOUNTER — Ambulatory Visit: Payer: Medicare Other | Admitting: Psychology

## 2018-04-05 ENCOUNTER — Other Ambulatory Visit: Payer: Self-pay | Admitting: Family Medicine

## 2018-04-05 DIAGNOSIS — F32A Depression, unspecified: Secondary | ICD-10-CM

## 2018-04-05 DIAGNOSIS — F329 Major depressive disorder, single episode, unspecified: Secondary | ICD-10-CM

## 2018-04-05 NOTE — Telephone Encounter (Signed)
Refill request for venlafaxine XR.  Last office visit 05/25/2017 and last refill 10/30/17. Refill sent to pt's pharmacy.

## 2018-04-06 ENCOUNTER — Telehealth: Payer: Self-pay | Admitting: *Deleted

## 2018-04-06 NOTE — Telephone Encounter (Signed)
"  I have a surgery scheduled with Dr. Amalia Hailey on August 15.  I need to know if I'm going to have to have anesthesia before the surgery.  I'm going to have an operation on my left foot, he's going to straighten up a couple of toes and do some other surgery.  I need to know if I'm going to have to have someone to drive me home or if I'm going to have anesthesia and can't drive myself.  You can give me a call, I'd appreciate it."

## 2018-04-06 NOTE — Telephone Encounter (Signed)
I'm returning your call.  You will receive anesthesia, they will put you to sleep.  "I found all the literature you gave me and it answered all my questions.  I'm sorry for bothering you.  Will I have to be there a hour before my surgery time?"  Someone from the surgical center will call you with the arrival time a day or two prior to your surgery date.  "Okay, thank you for calling me back."

## 2018-04-16 ENCOUNTER — Ambulatory Visit (INDEPENDENT_AMBULATORY_CARE_PROVIDER_SITE_OTHER): Payer: Medicare Other | Admitting: Family Medicine

## 2018-04-16 ENCOUNTER — Telehealth: Payer: Self-pay | Admitting: Family Medicine

## 2018-04-16 ENCOUNTER — Encounter: Payer: Self-pay | Admitting: Family Medicine

## 2018-04-16 ENCOUNTER — Other Ambulatory Visit: Payer: Self-pay | Admitting: Family Medicine

## 2018-04-16 VITALS — BP 139/72 | HR 64 | Temp 98.2°F | Ht 72.0 in | Wt 228.0 lb

## 2018-04-16 DIAGNOSIS — E785 Hyperlipidemia, unspecified: Secondary | ICD-10-CM

## 2018-04-16 DIAGNOSIS — E1165 Type 2 diabetes mellitus with hyperglycemia: Secondary | ICD-10-CM

## 2018-04-16 DIAGNOSIS — E1169 Type 2 diabetes mellitus with other specified complication: Secondary | ICD-10-CM | POA: Diagnosis not present

## 2018-04-16 DIAGNOSIS — E1151 Type 2 diabetes mellitus with diabetic peripheral angiopathy without gangrene: Secondary | ICD-10-CM | POA: Diagnosis not present

## 2018-04-16 DIAGNOSIS — Z79899 Other long term (current) drug therapy: Secondary | ICD-10-CM | POA: Diagnosis not present

## 2018-04-16 DIAGNOSIS — I10 Essential (primary) hypertension: Secondary | ICD-10-CM | POA: Diagnosis not present

## 2018-04-16 DIAGNOSIS — IMO0002 Reserved for concepts with insufficient information to code with codable children: Secondary | ICD-10-CM

## 2018-04-16 DIAGNOSIS — Z8546 Personal history of malignant neoplasm of prostate: Secondary | ICD-10-CM | POA: Diagnosis not present

## 2018-04-16 DIAGNOSIS — R3911 Hesitancy of micturition: Secondary | ICD-10-CM | POA: Diagnosis not present

## 2018-04-16 DIAGNOSIS — N400 Enlarged prostate without lower urinary tract symptoms: Secondary | ICD-10-CM | POA: Diagnosis not present

## 2018-04-16 MED ORDER — BENAZEPRIL-HYDROCHLOROTHIAZIDE 20-12.5 MG PO TABS: 1.0000 | ORAL_TABLET | Freq: Every day | ORAL | 2 refills | Status: DC

## 2018-04-16 MED ORDER — LISINOPRIL-HYDROCHLOROTHIAZIDE 10-12.5 MG PO TABS: 1.0000 | ORAL_TABLET | Freq: Every day | ORAL | 0 refills | Status: DC

## 2018-04-16 NOTE — Patient Instructions (Signed)

## 2018-04-16 NOTE — Assessment & Plan Note (Signed)
Check labs  hgba1c to be checked, minimize simple carbs. Increase exercise as tolerated. Continue current meds  

## 2018-04-16 NOTE — Assessment & Plan Note (Addendum)
Well controlled, . Encouraged heart healthy diet such as the DASH diet and exercise as tolerated.  Will change med due to back order

## 2018-04-16 NOTE — Assessment & Plan Note (Signed)
F/u urology Check psa

## 2018-04-16 NOTE — Progress Notes (Signed)
Patient ID: Andrew Ramos, male    DOB: Oct 19, 1931  Age: 84 y.o. MRN: [id number]    Subjective:  Subjective  HPI [name] presents for f/u bp.  He ran out of his meds and said the pharmacy told him it was backordered and they would not be able to get it until March 2020.   Pt has been working on losing weight and has lost about 40 lbs.  No complaints.    Review of Systems  Constitutional: Negative for chills and fever.  HENT: Negative for congestion and hearing loss.   Eyes: Negative for discharge.  Respiratory: Negative for cough and shortness of breath.   Cardiovascular: Negative for chest pain, palpitations and leg swelling.  Gastrointestinal: Negative for abdominal pain, blood in stool, constipation, diarrhea, nausea and vomiting.  Genitourinary: Negative for dysuria, frequency, hematuria and urgency.  Musculoskeletal: Negative for back pain and myalgias.  Skin: Negative for rash.  Allergic/Immunologic: Negative for environmental allergies.  Neurological: Negative for dizziness, weakness and headaches.  Hematological: Does not bruise/bleed easily.  Psychiatric/Behavioral: Negative for suicidal ideas. The patient is not nervous/anxious.     History Past Medical History:  Diagnosis Date  . Colon polyps    Tubular Adenoma 2005  . Diverticulosis   . Hepatitis    pt told by red cross has hepatitis antibodies in blood   . Hyperlipidemia   . Hypertension   . Melanoma ([place])   . Nonmelanoma skin cancer   . Osteoarthritis    right hip- none since hip replacement  . Polymyalgia rheumatica ([place])   . Polyposis of colon   . Prostate cancer ([place]) 2007   s/p radiation seed implants  . Prostate cancer ([place])   . Thyroid disease sees dr altzhimer for yearly   nodules    He has a past surgical history that includes Total knee arthroplasty (12/31/2008); Total hip arthroplasty (08/27/2008); Tonsillectomy (as child); Total hip arthroplasty (06/12/2012); Polypectomy; and Cataract  extraction.   His family history includes Coronary artery disease (age of onset: 78) in his mother; Coronary artery disease (age of onset: 71) in his father; Heart disease (age of onset: 33) in his mother; Pancreatic cancer (age of onset: 81) in his brother; Pancreatic cancer (age of onset: 81) in his father.He reports that he has never smoked. He has never used smokeless tobacco. He reports that he drinks alcohol. He reports that he does not use drugs.  Current Outpatient Medications on File Prior to Visit  Medication Sig Dispense Refill  . aspirin 81 MG tablet Take 1 tablet (81 mg total) by mouth daily. 30 tablet   . atorvastatin (LIPITOR) 40 MG tablet TAKE 1 TABLET DAILY 90 tablet 1  . Calcium Carbonate-Vitamin D (CALCIUM 600 + D PO) Take 1 tablet by mouth every morning.     . Cholecalciferol (VITAMIN D3) 1000 UNITS tablet Take 1,000 Units by mouth every morning.     . donepezil (ARICEPT) 5 MG tablet Take 1 tablet (5 mg total) by mouth at bedtime. 90 tablet 3  . feeding supplement, ENSURE ENLIVE, (ENSURE ENLIVE) LIQD Take 237 mLs by mouth 2 (two) times daily between meals. 301 mL 0  . folic acid (FOLVITE) 1 MG tablet Take 1 mg by mouth daily.    Marland Kitchen glucose blood (FREESTYLE LITE) test strip Check blood sugar once daily. Dx 250.00 100 each 12  . lisinopril-hydrochlorothiazide (PRINZIDE,ZESTORETIC) 10-12.5 MG tablet Take 1 tablet by mouth daily. 90 tablet 0  . methotrexate (RHEUMATREX) 2.5 MG  tablet Take 12.5 mg by mouth every Wednesday.     . Multiple Vitamins-Minerals (CENTRUM SILVER ULTRA MENS) TABS Take 1 tablet by mouth every evening.     . polyethylene glycol powder (GLYCOLAX/MIRALAX) powder Take 17 g by mouth daily as needed for moderate constipation. 1 scoop daily     . terazosin (HYTRIN) 1 MG capsule TAKE 1 CAPSULE AT BEDTIME 90 capsule 1  . venlafaxine XR (EFFEXOR-XR) 75 MG 24 hr capsule TAKE 1 CAPSULE DAILY WITH BREAKFAST 90 capsule 1   No current facility-administered medications on  file prior to visit.      Objective:  Objective  Physical Exam  Constitutional: He is oriented to person, place, and time. Vital signs are normal. He appears well-developed and well-nourished. He is sleeping.  HENT:  Head: Normocephalic and atraumatic.  Mouth/Throat: Oropharynx is clear and moist.  Eyes: Pupils are equal, round, and reactive to light. EOM are normal.  Neck: Normal range of motion. Neck supple. No thyromegaly present.  Cardiovascular: Normal rate and regular rhythm.  No murmur heard. Pulmonary/Chest: Effort normal and breath sounds normal. No respiratory distress. He has no wheezes. He has no rales. He exhibits no tenderness.  Musculoskeletal: He exhibits no edema or tenderness.  Neurological: He is alert and oriented to person, place, and time.  Skin: Skin is warm and dry.  Psychiatric: He has a normal mood and affect. His behavior is normal. Judgment and thought content normal.  Nursing note and vitals reviewed.  BP 139/72 (BP Location: Left Arm, Patient Position: Sitting, Cuff Size: Large)   Pulse 64   Temp 98.2 F (36.8 C) (Oral)   Ht 6' (1.829 m)   Wt 228 lb (103.4 kg)   SpO2 98%   BMI 30.92 kg/m  Wt Readings from Last 3 Encounters:  04/16/18 228 lb (103.4 kg)  03/21/18 228 lb 4 oz (103.5 kg)  03/02/18 228 lb (103.4 kg)     Lab Results  Component Value Date   WBC 2.9 (L) 07/05/2017   HGB 12.8 (L) 07/05/2017   HCT 38.0 (L) 07/05/2017   PLT 132.0 (L) 07/05/2017   GLUCOSE 96 05/25/2017   CHOL 112 05/25/2017   TRIG 78.0 05/25/2017   HDL 44.30 05/25/2017   LDLCALC 52 05/25/2017   ALT 13 05/25/2017   AST 20 05/25/2017   NA 141 05/25/2017   K 4.1 05/25/2017   CL 102 05/25/2017   CREATININE 1.16 05/25/2017   BUN 18 05/25/2017   CO2 29 05/25/2017   TSH 1.34 07/18/2016   PSA 0.05 (L) 05/25/2017   INR 1.06 09/29/2016   HGBA1C 5.7 05/25/2017   MICROALBUR <0.7 04/06/2015    US Carotid Bilateral  Result Date: 06/01/2017 CLINICAL DATA:  [facility]  screening EXAM: BILATERAL CAROTID DUPLEX ULTRASOUND TECHNIQUE: Pearline Cables scale imaging, color Doppler and duplex ultrasound were performed of bilateral carotid and vertebral arteries in the neck. COMPARISON:  None. FINDINGS: Criteria: Quantification of carotid stenosis is based on velocity parameters that correlate the residual internal carotid diameter with NASCET-based stenosis levels, using the diameter of the distal internal carotid lumen as the denominator for stenosis measurement. The following velocity measurements were obtained: RIGHT ICA:  128 cm/sec CCA:  502 cm/sec SYSTOLIC ICA/CCA RATIO:  1.1 DIASTOLIC ICA/CCA RATIO:  1.6 ECA:  93 cm/sec LEFT ICA:  83 cm/sec CCA:  774 cm/sec SYSTOLIC ICA/CCA RATIO:  0.8 DIASTOLIC ICA/CCA RATIO:  1.3 ECA:  97 cm/sec RIGHT CAROTID ARTERY: Mild focal calcified plaque in the bulb. Low resistance internal  carotid Doppler pattern is preserved. RIGHT VERTEBRAL ARTERY:  Antegrade. LEFT CAROTID ARTERY: Little if any plaque in the bulb. Low resistance internal carotid Doppler pattern LEFT VERTEBRAL ARTERY:  Antegrade. IMPRESSION: Less than 50% stenosis in the right and left internal carotid arteries. Electronically Signed   By: Marybelle Killings M.D.   On: 06/01/2017 16:47     Assessment & Plan:  Plan  I have discontinued Jaymon C. Krolak's alendronate. I am also having him start on benazepril-hydrochlorthiazide. Additionally, I am having him maintain his CENTRUM SILVER ULTRA MENS, cholecalciferol, Calcium Carbonate-Vitamin D (CALCIUM 600 + D PO), methotrexate, glucose blood, aspirin, polyethylene glycol powder, folic acid, feeding supplement (ENSURE ENLIVE), terazosin, atorvastatin, donepezil, venlafaxine XR, and lisinopril-hydrochlorothiazide.  Meds ordered this encounter  Medications  . benazepril-hydrochlorthiazide (LOTENSIN HCT) 20-12.5 MG tablet    Sig: Take 1 tablet by mouth daily.    Dispense:  30 tablet    Refill:  2    Problem List Items Addressed This Visit       Unprioritized   Benign prostatic hyperplasia without lower urinary tract symptoms    Pt will see urology Check psa today      DM (diabetes mellitus) type II, controlled, with peripheral vascular disorder ([place])    Check labs hgba1c to be checked, minimize simple carbs. Increase exercise as tolerated. Continue current meds       Relevant Medications   benazepril-hydrochlorthiazide (LOTENSIN HCT) 20-12.5 MG tablet   Essential hypertension    Well controlled, . Encouraged heart healthy diet such as the DASH diet and exercise as tolerated.  Will change med due to back order      Relevant Medications   benazepril-hydrochlorthiazide (LOTENSIN HCT) 20-12.5 MG tablet   Other Relevant Orders   Lipid panel   CBC with Differential/Platelet   Comprehensive metabolic panel   Hemoglobin A1c   Hyperlipidemia LDL goal <100    Tolerating statin, encouraged heart healthy diet, avoid trans fats, minimize simple carbs and saturated fats. Increase exercise as tolerated      Relevant Medications   benazepril-hydrochlorthiazide (LOTENSIN HCT) 20-12.5 MG tablet   PROSTATE CANCER, HX OF    F/u urology Check psa        Other Visit Diagnoses    DM (diabetes mellitus) type II uncontrolled, periph vascular disorder ([place])    -  Primary   Relevant Medications   benazepril-hydrochlorthiazide (LOTENSIN HCT) 20-12.5 MG tablet   Other Relevant Orders   Lipid panel   CBC with Differential/Platelet   Comprehensive metabolic panel   Hemoglobin A1c   Hyperlipidemia associated with type 2 diabetes mellitus (H[facility])       Relevant Medications   benazepril-hydrochlorthiazide (LOTENSIN HCT) 20-12.5 MG tablet   Other Relevant Orders   Lipid panel   CBC with Differential/Platelet   Comprehensive metabolic panel   Hemoglobin A1c   Urinary hesitancy       Relevant Orders   PSA   History of prostate cancer       Relevant Orders   PSA   High risk medication use          Follow-up: Return in about 3  months (around 07/17/2018), or if symptoms worsen or fail to improve.  Ann Held, DO

## 2018-04-16 NOTE — Telephone Encounter (Signed)
Patient called and advised Dr. Carollee Herter noted to return for a follow up in March and if it is ok to schedule an appointment, he agrees. Appointment scheduled for today at 67 with Dr. Carollee Herter for follow up HTN.

## 2018-04-16 NOTE — Assessment & Plan Note (Signed)
Tolerating statin, encouraged heart healthy diet, avoid trans fats, minimize simple carbs and saturated fats. Increase exercise as tolerated 

## 2018-04-16 NOTE — Telephone Encounter (Signed)
Copied from [place] [id number]. Topic: Quick Communication - Rx Refill/Question >> Apr 16, 2018  8:18 AM Yvette Rack wrote: Medication: lisinopril-hydrochlorothiazide (PRINZIDE,ZESTORETIC) 10-12.5 MG tablet  Has the patient contacted their pharmacy? Yes.  But mail service doesn't have medicine at this time (Agent: If no, request that the patient contact the pharmacy for the refill.) (Agent: If yes, when and what did the pharmacy advise?) not available to them at this moment to call provider to substitute the lisinopril   Preferred Pharmacy (with phone number or street name): [facility], [place] [phone] (Phone) [phone] (Fax)      Pt would like a ten day supply on this medicine because he is out he would like for the 10 day supply to go to the [id number] - [place], [place] - [address] [phone] (Phone) [phone] (Fax)      Agent: Please be advised that RX refills may take up to 3 business days. We ask that you follow-up with your pharmacy.

## 2018-04-16 NOTE — Assessment & Plan Note (Signed)
Pt will see urology Check psa today

## 2018-04-17 DIAGNOSIS — M25552 Pain in left hip: Secondary | ICD-10-CM | POA: Diagnosis not present

## 2018-04-17 DIAGNOSIS — M7062 Trochanteric bursitis, left hip: Secondary | ICD-10-CM | POA: Diagnosis not present

## 2018-04-17 LAB — COMPREHENSIVE METABOLIC PANEL
ALK PHOS: 71 U/L (ref 39–117)
ALT: 19 U/L (ref 0–53)
AST: 18 U/L (ref 0–37)
Albumin: 4.1 g/dL (ref 3.5–5.2)
BUN: 20 mg/dL (ref 6–23)
CO2: 29 meq/L (ref 19–32)
Calcium: 9.9 mg/dL (ref 8.4–10.5)
Chloride: 104 mEq/L (ref 96–112)
Creatinine, Ser: 1.04 mg/dL (ref 0.40–1.50)
GFR: 72.28 mL/min (ref >60.00)
GLUCOSE: 97 mg/dL (ref 70–99)
Potassium: 4.3 mEq/L (ref 3.5–5.1)
SODIUM: 140 meq/L (ref 135–145)
TOTAL PROTEIN: 6.3 g/dL (ref 6.0–8.3)
Total Bilirubin: 0.5 mg/dL (ref 0.2–1.2)

## 2018-04-17 LAB — CBC WITH DIFFERENTIAL/PLATELET
Basophils Absolute: 0.1 10*3/uL (ref 0.0–0.1)
Basophils Relative: 2 % (ref 0.0–3.0)
EOS PCT: 3.1 % (ref 0.0–5.0)
Eosinophils Absolute: 0.1 10*3/uL (ref 0.0–0.7)
HCT: 37.3 % — ABNORMAL LOW (ref 39.0–52.0)
HEMOGLOBIN: 12.9 g/dL — AB (ref 13.0–17.0)
Lymphocytes Relative: 38.8 % (ref 12.0–46.0)
Lymphs Abs: 1.4 10*3/uL (ref 0.7–4.0)
MCHC: 34.7 g/dL (ref 30.0–36.0)
MCV: 102.2 fl — ABNORMAL HIGH (ref 78.0–100.0)
MONO ABS: 0.9 10*3/uL (ref 0.1–1.0)
Monocytes Relative: 24 % — ABNORMAL HIGH (ref 3.0–12.0)
NEUTROS PCT: 32.1 % — AB (ref 43.0–77.0)
Neutro Abs: 1.2 10*3/uL — ABNORMAL LOW (ref 1.4–7.7)
Platelets: 123 10*3/uL — ABNORMAL LOW (ref 150.0–400.0)
RBC: 3.65 Mil/uL — ABNORMAL LOW (ref 4.22–5.81)
RDW: 14.2 % (ref 11.5–15.5)
WBC: 3.7 10*3/uL — AB (ref 4.0–10.5)

## 2018-04-17 LAB — LIPID PANEL
Cholesterol: 108 mg/dL (ref 0–200)
HDL: 51.1 mg/dL (ref >39.00)
LDL CALC: 44 mg/dL (ref 0–99)
NonHDL: 57.38
TRIGLYCERIDES: 66 mg/dL (ref 0.0–149.0)
Total CHOL/HDL Ratio: 2
VLDL: 13.2 mg/dL (ref 0.0–40.0)

## 2018-04-17 LAB — PSA: PSA: 0.04 ng/mL — ABNORMAL LOW (ref 0.10–4.00)

## 2018-04-17 LAB — HEMOGLOBIN A1C: Hgb A1c MFr Bld: 6 % (ref 4.6–6.5)

## 2018-04-18 ENCOUNTER — Other Ambulatory Visit: Payer: Self-pay | Admitting: Family Medicine

## 2018-04-18 DIAGNOSIS — D729 Disorder of white blood cells, unspecified: Secondary | ICD-10-CM

## 2018-04-18 DIAGNOSIS — D691 Qualitative platelet defects: Secondary | ICD-10-CM

## 2018-04-19 ENCOUNTER — Telehealth: Payer: Self-pay

## 2018-04-19 NOTE — Telephone Encounter (Signed)
Author phoned pt. to relay lab results and hematology referral placed 7/31. No answer. Author left VM asking for call back [phone].

## 2018-04-19 NOTE — Telephone Encounter (Signed)
Patient called back, tried to reach office and kept getting busy signal. Please call patient back

## 2018-04-19 NOTE — Telephone Encounter (Signed)
-----   Message from Ann Held, DO sent at 04/18/2018 12:31 PM EDT ----- Low wbc / platelets----  Refer to hematology

## 2018-04-20 NOTE — Telephone Encounter (Signed)
Author returned pt's phone call, and relayed lab results and hematology referral; pt. Verbalized understanding.Pt. stated he has L foot hammer toe surgery scheduled for 8/15. Dr. Etter Sjogren notified.

## 2018-04-24 ENCOUNTER — Telehealth: Payer: Self-pay | Admitting: Podiatry

## 2018-04-24 NOTE — Telephone Encounter (Signed)
Andrew Ramos said you left him a voicemail. He wanted you to call him back when you get a chance.

## 2018-04-25 ENCOUNTER — Telehealth: Payer: Self-pay | Admitting: Podiatry

## 2018-04-25 NOTE — Telephone Encounter (Signed)
I informed pt that he was having a bone cutting surgery and would only be able to be up on the surgery foot 15 minutes/hour in the walking boot, that if he had a walker that would allow him to put the foot down and pick it up quickly and still balance. Pt states he has a walker. I told pt if at the time of the surgery Dr. Amalia Hailey said he would not be able to bear weight then we could order the knee scooter, or wheelchair. Pt states understanding. Pt states his wife has just had heart surgery, kidney problems and sarcoidosis and will not be able to help. I told pt with most insurances a skilled nursing order was needed, for a [facility] aide to be able to be covered. I told pt I would send a message to Dr. Amalia Hailey and see there was any skilled nursing orders for [facility].

## 2018-04-25 NOTE — Telephone Encounter (Signed)
I'm scheduled to have surgery on 15 August. I would like to know what it is going to entail for me after surgery? What kind of assistance will I need? What can I expect in regards to mobility, will I be able to walk or not? Will I need the assistance of a wheelchair or a walker? If you could give me a call I would appreciate it. My number is [phone].

## 2018-04-26 NOTE — Telephone Encounter (Signed)
I'm not sure what skilled nursing orders are, but sure, if patient wants dressing changes, go ahead and order them beginning the following Monday after surgery.  3x / week x 4 weeks.  - Paint incision sites with betadine.  - Apply nonstick telfa, 4x4 gauze, large kerlex, lightly wrapped 4" ace.   Thanks, Dr. Amalia Hailey

## 2018-04-27 NOTE — Telephone Encounter (Signed)
I informed pt of Dr. Amalia Hailey orders and [facility] [phone] care may contact him prior to the surgery to establish him in their agency. Faxed orders to [facility].

## 2018-05-01 ENCOUNTER — Telehealth: Payer: Self-pay | Admitting: Hematology & Oncology

## 2018-05-01 ENCOUNTER — Telehealth: Payer: Self-pay | Admitting: *Deleted

## 2018-05-01 NOTE — Telephone Encounter (Signed)
"  I received a call last week, Thursday or Friday, that indicated Dr. Amalia Hailey approved [place] to have some help come in to support [place] during my surgical recover, for at least a week.  I haven't received a call from the group that is supposed to do that.  Call me back and let me know what's happening."

## 2018-05-01 NOTE — Telephone Encounter (Signed)
Unable to reach patient by phone. Unable to lmom to inform pt of New Patient appt 9/4 at 1030 am. letter mailed 05/01/18

## 2018-05-02 NOTE — Telephone Encounter (Signed)
I called Dian Situ to check on the status of the referral for home health care for Mr. Allnutt.  He stated they had received the order and everything is in place.  He said he would call      I am returning your call.  I called and spoke to [facility] at [facility].  He said they have the order and everything is in place to help you.  He said he was going to give you a call.  "You know my surgery is scheduled for tomorrow?"  Yes, I am aware and the service will probably start on Thursday.  "What's the phone number for Nanine Means?"  Drew's phone number is [phone].

## 2018-05-03 ENCOUNTER — Encounter: Payer: Self-pay | Admitting: *Deleted

## 2018-05-03 ENCOUNTER — Telehealth: Payer: Self-pay | Admitting: *Deleted

## 2018-05-03 ENCOUNTER — Other Ambulatory Visit: Payer: Self-pay | Admitting: Podiatry

## 2018-05-03 DIAGNOSIS — M21622 Bunionette of left foot: Secondary | ICD-10-CM | POA: Diagnosis not present

## 2018-05-03 DIAGNOSIS — M2042 Other hammer toe(s) (acquired), left foot: Secondary | ICD-10-CM | POA: Diagnosis not present

## 2018-05-03 DIAGNOSIS — E7800 Pure hypercholesterolemia, unspecified: Secondary | ICD-10-CM | POA: Diagnosis not present

## 2018-05-03 DIAGNOSIS — M21542 Acquired clubfoot, left foot: Secondary | ICD-10-CM | POA: Diagnosis not present

## 2018-05-03 DIAGNOSIS — M21612 Bunion of left foot: Secondary | ICD-10-CM | POA: Diagnosis not present

## 2018-05-03 NOTE — Telephone Encounter (Signed)
"  This is Andrew Ramos, son of Huntington Leverich.  He's coming in for a surgery tomorrow.  I want to verify the date.  He's 75 and he gets a little confused about dates and that kind of stuff.  I also want to verify where the surgery is going to be taking place with Dr. Daylene Katayama.  Call me back please and let me know the exact address of the surgery tomorrow.  I would greatly appreciate it."

## 2018-05-04 ENCOUNTER — Telehealth: Payer: Self-pay | Admitting: Podiatry

## 2018-05-04 NOTE — Telephone Encounter (Signed)
Spoke with patient instructing him that he could loosen/remove his boot temporarily while sitting to help relieve some of the pressure and tightness from the boot.  I instructed him that if he was to ambulate or move around he is supposed to be in the boot at all times.  Patient expressed understanding.  I also informed him not to touch or change the bandages.  Please call with any further questions or concerns.

## 2018-05-04 NOTE — Telephone Encounter (Signed)
I had surgery yesterday with Dr. Amalia Hailey. I'm having some difficulty I think with understanding what I need to do. I have a big foot boot on and I feel like my foot and ankle are swelling. I also don't know exactly when I should take the boot off and change the dressing. So if you would give me a call back at [phone] I would appreciate it. Thank you.

## 2018-05-07 ENCOUNTER — Telehealth: Payer: Self-pay | Admitting: Family Medicine

## 2018-05-07 ENCOUNTER — Telehealth: Payer: Self-pay | Admitting: Podiatry

## 2018-05-07 DIAGNOSIS — M810 Age-related osteoporosis without current pathological fracture: Secondary | ICD-10-CM | POA: Diagnosis not present

## 2018-05-07 DIAGNOSIS — M353 Polymyalgia rheumatica: Secondary | ICD-10-CM | POA: Diagnosis not present

## 2018-05-07 DIAGNOSIS — I10 Essential (primary) hypertension: Secondary | ICD-10-CM | POA: Diagnosis not present

## 2018-05-07 DIAGNOSIS — K759 Inflammatory liver disease, unspecified: Secondary | ICD-10-CM | POA: Diagnosis not present

## 2018-05-07 DIAGNOSIS — Z6831 Body mass index (BMI) 31.0-31.9, adult: Secondary | ICD-10-CM | POA: Diagnosis not present

## 2018-05-07 DIAGNOSIS — M21622 Bunionette of left foot: Secondary | ICD-10-CM | POA: Diagnosis not present

## 2018-05-07 DIAGNOSIS — E669 Obesity, unspecified: Secondary | ICD-10-CM | POA: Diagnosis not present

## 2018-05-07 DIAGNOSIS — Z4789 Encounter for other orthopedic aftercare: Secondary | ICD-10-CM | POA: Diagnosis not present

## 2018-05-07 DIAGNOSIS — Z8546 Personal history of malignant neoplasm of prostate: Secondary | ICD-10-CM | POA: Diagnosis not present

## 2018-05-07 DIAGNOSIS — Z79899 Other long term (current) drug therapy: Secondary | ICD-10-CM | POA: Diagnosis not present

## 2018-05-07 DIAGNOSIS — Z7982 Long term (current) use of aspirin: Secondary | ICD-10-CM | POA: Diagnosis not present

## 2018-05-07 DIAGNOSIS — Z96641 Presence of right artificial hip joint: Secondary | ICD-10-CM | POA: Diagnosis not present

## 2018-05-07 DIAGNOSIS — M2042 Other hammer toe(s) (acquired), left foot: Secondary | ICD-10-CM | POA: Diagnosis not present

## 2018-05-07 DIAGNOSIS — M21542 Acquired clubfoot, left foot: Secondary | ICD-10-CM | POA: Diagnosis not present

## 2018-05-07 DIAGNOSIS — Z4801 Encounter for change or removal of surgical wound dressing: Secondary | ICD-10-CM | POA: Diagnosis not present

## 2018-05-07 DIAGNOSIS — F329 Major depressive disorder, single episode, unspecified: Secondary | ICD-10-CM | POA: Diagnosis not present

## 2018-05-07 NOTE — Telephone Encounter (Signed)
This is Veterinary surgeon, Chartered certified accountant with Andrew Ramos. I just wanted to report on his wound. My number is [phone].

## 2018-05-07 NOTE — Telephone Encounter (Signed)
Copied from [place] [phone] [phone]. Topic: General - Other >> May 07, 2018  2:34 PM Keene Breath wrote: Reason for CRM: Langley Gauss from [facility] called to give orders for patient's nursing and wound care of foot.  Also patient's BP will be assessed.  2 x wk for 2-3 wks.  CB# [phone].

## 2018-05-07 NOTE — Telephone Encounter (Signed)
Langley Gauss, RN - Nanine Means states there had been a lot of dried, but otherwise looks good. Langley Gauss, RN states betadine dressings are not skilled nursing task, but will see them on Friday. I told her, pt needed home health aid due to he did not have help because his wife had recent surgery. Langley Gauss states she asked pt if had needed assistance with dressing and getting ready and he said he was getting around fine. Langley Gauss offered to show wife how to perform the dressings and wife left the room and closed the door. I told Langley Gauss, RN would have Dr. Amalia Hailey see if pt needed home health aide, if not then [facility] could possibly discharge and the dressing changes could be performed in office as routinely scheduled.

## 2018-05-08 NOTE — Telephone Encounter (Signed)
Spoke with Langley Gauss and she reports that the surgeon is going to sign the home care orders. She just needs to verify with PCP the current medications pt is taking. Viewed med list. She states pt is no longer using ensure and he does not have any lisinopril HCTZ in his home. He only has benazepril HCTZ 20/12.5 at home. Advised her per last OV note, he should be on Benazepril hctz instead of lisinopril and meds are correct at home. Pt is currently taking 15mg  of methotrexate a week. There is a possible drug interaction between Meloxicam and methotrexate but she states pt should be finished with meloxicam before he takes his next dose of methotrexate and nothing further is needed. Med list has been updated.

## 2018-05-09 ENCOUNTER — Ambulatory Visit (INDEPENDENT_AMBULATORY_CARE_PROVIDER_SITE_OTHER): Payer: Medicare Other | Admitting: Podiatry

## 2018-05-09 ENCOUNTER — Ambulatory Visit: Payer: Medicare Other | Admitting: *Deleted

## 2018-05-09 ENCOUNTER — Ambulatory Visit (INDEPENDENT_AMBULATORY_CARE_PROVIDER_SITE_OTHER): Payer: Medicare Other

## 2018-05-09 DIAGNOSIS — M21622 Bunionette of left foot: Secondary | ICD-10-CM

## 2018-05-09 DIAGNOSIS — Z9889 Other specified postprocedural states: Secondary | ICD-10-CM | POA: Diagnosis not present

## 2018-05-09 DIAGNOSIS — M2042 Other hammer toe(s) (acquired), left foot: Secondary | ICD-10-CM | POA: Diagnosis not present

## 2018-05-11 ENCOUNTER — Ambulatory Visit: Payer: Medicare Other | Admitting: *Deleted

## 2018-05-11 DIAGNOSIS — I10 Essential (primary) hypertension: Secondary | ICD-10-CM | POA: Diagnosis not present

## 2018-05-11 DIAGNOSIS — M2042 Other hammer toe(s) (acquired), left foot: Secondary | ICD-10-CM | POA: Diagnosis not present

## 2018-05-11 DIAGNOSIS — Z4801 Encounter for change or removal of surgical wound dressing: Secondary | ICD-10-CM | POA: Diagnosis not present

## 2018-05-11 DIAGNOSIS — M21622 Bunionette of left foot: Secondary | ICD-10-CM | POA: Diagnosis not present

## 2018-05-11 DIAGNOSIS — Z4789 Encounter for other orthopedic aftercare: Secondary | ICD-10-CM | POA: Diagnosis not present

## 2018-05-11 DIAGNOSIS — K759 Inflammatory liver disease, unspecified: Secondary | ICD-10-CM | POA: Diagnosis not present

## 2018-05-12 ENCOUNTER — Other Ambulatory Visit: Payer: Self-pay | Admitting: Family Medicine

## 2018-05-13 NOTE — Progress Notes (Signed)
   Subjective:  Patient presents today status post Tailor's bunionectomy and hammertoe repair of digits 4 and 5 of the left foot. DOS: 05/03/18. He states he is doing well overall. He denies any new complaints at this time. He has been weightbearing in the CAM boot without issue. Patient is here for further evaluation and treatment.    Past Medical History:  Diagnosis Date  . Colon polyps    Tubular Adenoma 2005  . Diverticulosis   . Hepatitis    pt told by red cross has hepatitis antibodies in blood   . Hyperlipidemia   . Hypertension   . Melanoma ([place])   . Nonmelanoma skin cancer   . Osteoarthritis    right hip- none since hip replacement  . Polymyalgia rheumatica ([place])   . Polyposis of colon   . Prostate cancer ([place]) 2007   s/p radiation seed implants  . Prostate cancer ([place])   . Thyroid disease sees dr altzhimer for yearly   nodules      Objective/Physical Exam Neurovascular status intact.  Skin incisions appear to be well coapted with sutures and staples intact. No sign of infectious process noted. No dehiscence. No active bleeding noted. Moderate edema noted to the surgical extremity.  Radiographic Exam:  Orthopedic hardware and osteotomies sites appear to be stable with routine healing.  Assessment: 1. s/p Tailor's bunionectomy and hammertoe repair of digits 4 and 5 of the left foot. DOS: 05/03/18.   Plan of Care:  1. Patient was evaluated. X-rays reviewed 2. Dressing changed. Keep clean, dry and intact for one week.  3. Continue weightbearing in CAM boot.  4. Return to clinic in one week.    Edrick Kins, DPM [facility]  Dr. Edrick Kins, [address]                                        [place], [place] [address]                Office [phone]  Fax [phone]

## 2018-05-14 DIAGNOSIS — I10 Essential (primary) hypertension: Secondary | ICD-10-CM | POA: Diagnosis not present

## 2018-05-14 DIAGNOSIS — Z4801 Encounter for change or removal of surgical wound dressing: Secondary | ICD-10-CM | POA: Diagnosis not present

## 2018-05-14 DIAGNOSIS — K759 Inflammatory liver disease, unspecified: Secondary | ICD-10-CM | POA: Diagnosis not present

## 2018-05-14 DIAGNOSIS — M2042 Other hammer toe(s) (acquired), left foot: Secondary | ICD-10-CM | POA: Diagnosis not present

## 2018-05-14 DIAGNOSIS — Z4789 Encounter for other orthopedic aftercare: Secondary | ICD-10-CM | POA: Diagnosis not present

## 2018-05-14 DIAGNOSIS — M21622 Bunionette of left foot: Secondary | ICD-10-CM | POA: Diagnosis not present

## 2018-05-16 ENCOUNTER — Ambulatory Visit (INDEPENDENT_AMBULATORY_CARE_PROVIDER_SITE_OTHER): Payer: Medicare Other | Admitting: Podiatry

## 2018-05-16 ENCOUNTER — Other Ambulatory Visit: Payer: Self-pay | Admitting: Family Medicine

## 2018-05-16 DIAGNOSIS — Z9889 Other specified postprocedural states: Secondary | ICD-10-CM

## 2018-05-17 ENCOUNTER — Telehealth: Payer: Self-pay | Admitting: Podiatry

## 2018-05-17 NOTE — Telephone Encounter (Signed)
I informed Andrew Ramos Dr. Amalia Hailey had discontinued [place].

## 2018-05-17 NOTE — Telephone Encounter (Signed)
Okay to d/c

## 2018-05-17 NOTE — Telephone Encounter (Signed)
This is Veterinary surgeon, Therapist, sports with [facility]. Mr. Vejar called me earlier stating that Dr. Amalia Hailey is discontinuing his home care and that all staples and sutures have been removed. If you could please give me a call back at [phone] and just let me know if this is accurate. Thank you.

## 2018-05-18 DIAGNOSIS — Z4801 Encounter for change or removal of surgical wound dressing: Secondary | ICD-10-CM | POA: Diagnosis not present

## 2018-05-18 DIAGNOSIS — K759 Inflammatory liver disease, unspecified: Secondary | ICD-10-CM | POA: Diagnosis not present

## 2018-05-18 DIAGNOSIS — Z4789 Encounter for other orthopedic aftercare: Secondary | ICD-10-CM | POA: Diagnosis not present

## 2018-05-18 DIAGNOSIS — M21622 Bunionette of left foot: Secondary | ICD-10-CM | POA: Diagnosis not present

## 2018-05-18 DIAGNOSIS — M2042 Other hammer toe(s) (acquired), left foot: Secondary | ICD-10-CM | POA: Diagnosis not present

## 2018-05-18 DIAGNOSIS — I10 Essential (primary) hypertension: Secondary | ICD-10-CM | POA: Diagnosis not present

## 2018-05-19 NOTE — Progress Notes (Signed)
   Subjective:  Patient presents today status post Tailor's bunionectomy and hammertoe repair of digits 4 and 5 of the left foot. DOS: 05/03/18. He states he is doing very well. He denies any pain or new complaints at this time. He has been wearing the CAM boot as directed. Patient is here for further evaluation and treatment.    Past Medical History:  Diagnosis Date  . Colon polyps    Tubular Adenoma 2005  . Diverticulosis   . Hepatitis    pt told by red cross has hepatitis antibodies in blood   . Hyperlipidemia   . Hypertension   . Melanoma ([place])   . Nonmelanoma skin cancer   . Osteoarthritis    right hip- none since hip replacement  . Polymyalgia rheumatica ([place])   . Polyposis of colon   . Prostate cancer ([place]) 2007   s/p radiation seed implants  . Prostate cancer ([place])   . Thyroid disease sees dr altzhimer for yearly   nodules      Objective/Physical Exam Neurovascular status intact.  Skin incisions appear to be well coapted with sutures and staples intact. No sign of infectious process noted. No dehiscence. No active bleeding noted. Moderate edema noted to the surgical extremity.  Assessment: 1. s/p Tailor's bunionectomy and hammertoe repair of digits 4 and 5 of the left foot. DOS: 05/03/18.   Plan of Care:  1. Patient was evaluated.  2. Sutures/staples removed. Dry sterile dressing applied.  3. Post op shoe dispensed. Discontinue using CAM boot.  4. Compression anklet dispensed.  5. Return to clinic in 2 weeks.    Edrick Kins, DPM [facility]  Dr. Edrick Kins, [address]                                        [place], [place] [address]                Office [phone]  Fax [phone]

## 2018-05-23 ENCOUNTER — Other Ambulatory Visit: Payer: Self-pay

## 2018-05-23 ENCOUNTER — Inpatient Hospital Stay: Payer: Medicare Other

## 2018-05-23 ENCOUNTER — Inpatient Hospital Stay: Payer: Medicare Other | Attending: Hematology & Oncology | Admitting: Hematology & Oncology

## 2018-05-23 VITALS — BP 182/82 | HR 58 | Temp 97.8°F | Resp 20 | Wt 232.2 lb

## 2018-05-23 DIAGNOSIS — D696 Thrombocytopenia, unspecified: Secondary | ICD-10-CM | POA: Insufficient documentation

## 2018-05-23 DIAGNOSIS — D61818 Other pancytopenia: Secondary | ICD-10-CM | POA: Diagnosis not present

## 2018-05-23 DIAGNOSIS — E785 Hyperlipidemia, unspecified: Secondary | ICD-10-CM | POA: Diagnosis not present

## 2018-05-23 DIAGNOSIS — D4620 Refractory anemia with excess of blasts, unspecified: Secondary | ICD-10-CM

## 2018-05-23 DIAGNOSIS — Z79899 Other long term (current) drug therapy: Secondary | ICD-10-CM | POA: Diagnosis not present

## 2018-05-23 DIAGNOSIS — E079 Disorder of thyroid, unspecified: Secondary | ICD-10-CM | POA: Insufficient documentation

## 2018-05-23 DIAGNOSIS — M353 Polymyalgia rheumatica: Secondary | ICD-10-CM | POA: Insufficient documentation

## 2018-05-23 DIAGNOSIS — Z8546 Personal history of malignant neoplasm of prostate: Secondary | ICD-10-CM | POA: Insufficient documentation

## 2018-05-23 DIAGNOSIS — Z792 Long term (current) use of antibiotics: Secondary | ICD-10-CM | POA: Diagnosis not present

## 2018-05-23 DIAGNOSIS — Z7982 Long term (current) use of aspirin: Secondary | ICD-10-CM | POA: Diagnosis not present

## 2018-05-23 DIAGNOSIS — I10 Essential (primary) hypertension: Secondary | ICD-10-CM | POA: Diagnosis not present

## 2018-05-23 LAB — CBC WITH DIFFERENTIAL (CANCER CENTER ONLY)
Basophils Absolute: 0 10*3/uL (ref 0.0–0.1)
Basophils Relative: 1 %
EOS PCT: 5 %
Eosinophils Absolute: 0.2 10*3/uL (ref 0.0–0.5)
HEMATOCRIT: 35.9 % — AB (ref 38.7–49.9)
HEMOGLOBIN: 11.7 g/dL — AB (ref 13.0–17.1)
LYMPHS ABS: 1.2 10*3/uL (ref 0.9–3.3)
LYMPHS PCT: 41 %
MCH: 34.4 pg — AB (ref 28.0–33.4)
MCHC: 32.6 g/dL (ref 32.0–35.9)
MCV: 105.6 fL — AB (ref 82.0–98.0)
Monocytes Absolute: 0.9 10*3/uL (ref 0.1–0.9)
Monocytes Relative: 30 %
Neutro Abs: 0.7 10*3/uL — ABNORMAL LOW (ref 1.5–6.5)
Neutrophils Relative %: 23 %
PLATELETS: 111 10*3/uL — AB (ref 145–400)
RBC: 3.4 MIL/uL — AB (ref 4.20–5.70)
RDW: 14 % (ref 11.1–15.7)
WBC: 2.9 10*3/uL — AB (ref 4.0–10.0)

## 2018-05-23 LAB — VITAMIN B12: Vitamin B-12: 512 pg/mL (ref 180–914)

## 2018-05-23 LAB — RETICULOCYTES
RBC.: 3.42 MIL/uL — ABNORMAL LOW (ref 4.20–5.82)
RETIC COUNT ABSOLUTE: 75.2 10*3/uL (ref 34.8–93.9)
Retic Ct Pct: 2.2 % — ABNORMAL HIGH (ref 0.8–1.8)

## 2018-05-23 LAB — SAVE SMEAR

## 2018-05-23 LAB — LACTATE DEHYDROGENASE: LDH: 227 U/L — ABNORMAL HIGH (ref 98–192)

## 2018-05-23 NOTE — Progress Notes (Signed)
Referral MD  Reason for Referral: Leukopenia and thrombocytopenia-likely methotrexate induced  Chief Complaint  Patient presents with  . New Patient (Initial Visit)  : My blood counts have been low.  HPI: Andrew Ramos is a wonderful 84 year old white male.  He is originally from [place].  He was in the [employer] for 26 years.  He served in [place].  He retired when he was at [employer] and then worked for [employer] until he retired.  He has polymyalgia rheumatica.  He is on methotrexate.  He takes weekly methotrexate.  He has a history of prostate cancer.  He was treated with seeds.  He is followed by Dr. Etter Sjogren.  She is noted that his blood counts have been dropping slowly.  As such, she kindly referred him to the [facility] for an evaluation.  Going back a couple years already, she noted that he had a white cell count of 3.9.  Hemoglobin 14.2.  Platelet count was 159,000.  In January 2018, a CBC showed a white cell count 4.4.  Hemoglobin 10.2.  Platelet count 167,000.    In October 2018, his white cell count was 2.9.  Hemoglobin 12.8.  Platelet count 132,000.  His MCV was increased at 102.  In July of this year, a CBC is was repeated and was found with a white cell count of 3.7.  Hemoglobin 12.9.  Platelet count 123,000.  His MCV was 102.  He has had some bruising.  He has not had bleeding.  There is no weight loss or weight gain.  He has no nausea or vomiting.  He does have some leg swelling.  He has had surgery on the left knee.  He has had no problem with infections.  His appetite is doing okay.  Overall, his performance status is ECOG 1.   Past Medical History:  Diagnosis Date  . Colon polyps    Tubular Adenoma 2005  . Diverticulosis   . Hepatitis    pt told by red cross has hepatitis antibodies in blood   . Hyperlipidemia   . Hypertension   . Melanoma ([place])   . Nonmelanoma skin cancer   . Osteoarthritis    right hip- none since hip  replacement  . Polymyalgia rheumatica ([place])   . Polyposis of colon   . Prostate cancer ([place]) 2007   s/p radiation seed implants  . Prostate cancer ([place])   . Thyroid disease sees dr altzhimer for yearly   nodules  :  Past Surgical History:  Procedure Laterality Date  . CATARACT EXTRACTION    . POLYPECTOMY    . TONSILLECTOMY  as child  . TOTAL HIP ARTHROPLASTY  08/27/2008   left  . TOTAL HIP ARTHROPLASTY  06/12/2012   Procedure: TOTAL HIP ARTHROPLASTY ANTERIOR APPROACH;  Surgeon: Mauri Pole, MD;  Location: [facility];  Service: Orthopedics;  Laterality: Right;  . TOTAL KNEE ARTHROPLASTY  12/31/2008   left  :   Current Outpatient Medications:  .  aspirin 81 MG tablet, Take 1 tablet (81 mg total) by mouth daily., Disp: 30 tablet, Rfl:  .  atorvastatin (LIPITOR) 40 MG tablet, TAKE 1 TABLET DAILY, Disp: 90 tablet, Rfl: 4 .  benazepril-hydrochlorthiazide (LOTENSIN HCT) 20-12.5 MG tablet, Take 1 tablet by mouth daily., Disp: 30 tablet, Rfl: 2 .  Calcium Carbonate-Vitamin D (CALCIUM 600 + D PO), Take 1 tablet by mouth every morning. , Disp: , Rfl:  .  Cholecalciferol (VITAMIN D3) 1000 UNITS tablet, Take  1,000 Units by mouth every morning. , Disp: , Rfl:  .  donepezil (ARICEPT) 5 MG tablet, Take 1 tablet (5 mg total) by mouth at bedtime., Disp: 90 tablet, Rfl: 3 .  folic acid (FOLVITE) 1 MG tablet, Take 1 mg by mouth daily., Disp: , Rfl:  .  methotrexate (RHEUMATREX) 2.5 MG tablet, Take 15 mg by mouth every Wednesday. , Disp: , Rfl:  .  Multiple Vitamins-Minerals (CENTRUM SILVER ULTRA MENS) TABS, Take 1 tablet by mouth every evening. , Disp: , Rfl:  .  polyethylene glycol powder (GLYCOLAX/MIRALAX) powder, Take 17 g by mouth daily as needed for moderate constipation. 1 scoop daily , Disp: , Rfl:  .  terazosin (HYTRIN) 1 MG capsule, TAKE 1 CAPSULE AT BEDTIME, Disp: 90 capsule, Rfl: 0 .  venlafaxine XR (EFFEXOR-XR) 75 MG 24 hr capsule, TAKE 1 CAPSULE DAILY WITH BREAKFAST, Disp: 90 capsule, Rfl:  1:  :  No Known Allergies:  Family History  Problem Relation Age of Onset  . Coronary artery disease Mother 31  . Heart disease Mother 61       MI  . Coronary artery disease Father 53  . Pancreatic cancer Father 2       deceased 48  . Pancreatic cancer Brother 41       deceased 64; smoker  . Colon cancer Neg Hx   . Colon polyps Neg Hx   . Esophageal cancer Neg Hx   . Kidney disease Neg Hx   . Diabetes Neg Hx   . Gallbladder disease Neg Hx   :  Social History   Socioeconomic History  . Marital status: Married    Spouse name: Not on file  . Number of children: 2  . Years of education: Not on file  . Highest education level: Not on file  Occupational History  . Occupation: retired Lobbyist: RETIRED  Social Needs  . Financial resource strain: Not on file  . Food insecurity:    Worry: Not on file    Inability: Not on file  . Transportation needs:    Medical: Not on file    Non-medical: Not on file  Tobacco Use  . Smoking status: Never Smoker  . Smokeless tobacco: Never Used  Substance and Sexual Activity  . Alcohol use: Yes    Alcohol/week: 0.0 standard drinks    Comment: occasional  . Drug use: No  . Sexual activity: Yes    Partners: Female  Lifestyle  . Physical activity:    Days per week: Not on file    Minutes per session: Not on file  . Stress: Not on file  Relationships  . Social connections:    Talks on phone: Not on file    Gets together: Not on file    Attends religious service: Not on file    Active member of club or organization: Not on file    Attends meetings of clubs or organizations: Not on file    Relationship status: Not on file  . Intimate partner violence:    Fear of current or ex partner: Not on file    Emotionally abused: Not on file    Physically abused: Not on file    Forced sexual activity: Not on file  Other Topics Concern  . Not on file  Social History Narrative   Exercise--no  :  Review of Systems   Constitutional: Negative.   HENT: Negative.   Eyes: Negative.   Respiratory: Negative.  Cardiovascular: Negative.   Gastrointestinal: Negative.   Genitourinary: Negative.   Musculoskeletal: Positive for joint pain and myalgias.  Skin: Negative.   Neurological: Negative.   Endo/Heme/Allergies: Bruises/bleeds easily.  Psychiatric/Behavioral: Negative.      Exam: Elderly well-nourished white male in no obvious distress.  Vital signs are temperature of 97 8.  Pulse 58.  Blood pressure 182/82.  Weight is 232 pounds.  Head neck exam shows no ocular or oral lesions.  He has no palpable cervical or supraclavicular lymph nodes.  He has no palpable thyroid.  Lungs are clear bilaterally.  Cardiac exam regular rate and rhythm with no murmurs, rubs or bruits.  Abdomen is soft.  He has good bowel sounds.  There is no fluid wave.  There is no palpable liver or spleen tip.  Back exam shows no tenderness over the spine, ribs or hips.  Extremities show some chronic pitting edema in his legs.  He has a surgical scar on the left knee.  He is wearing a walking brace on the left foot.  Skin exam shows some scattered ecchymoses.  Neurological exam shows no focal neurological deficits. @IPVITALS @   Recent Labs    05/23/18 1106  WBC 2.9*  HGB 11.7*  HCT 35.9*  PLT 111*   No results for input(s): NA, K, CL, CO2, GLUCOSE, BUN, CREATININE, CALCIUM in the last 72 hours.  Blood smear review: Normochromic and normocytic population of red blood cells.  I see no nucleated red blood cells.  I see no teardrop cells.  There is no rouleaux formation.  He has no schistocytes or spherocytes.  There are no inclusion bodies.  White cells are decreased in number.  There might be an increase in monocytes.  I do not see any hypersegmented polys.  He has no immature myeloid or lymphoid cells.  I see no blasts.  Platelets are slightly decreased in number.  Platelets are fairly well granulated.  Pathology: None    Assessment  and Plan: Andrew Ramos is a very nice 84 year old white male.  Again he served our country probably in the [employer].  I think him for his service.  He has basically mild pancytopenia.  He has elevated MCV.  He certainly could have myelodysplasia at his age.  The one way to know this would be to do a bone marrow biopsy.  I do believe that the methotrexate that he is on is also a major factor with the pancytopenia.  Again he needs methotrexate.  He really has not had flareups of the PMR with methotrexate.  I do not know if his rheumatologist can decrease the dose of the methotrexate or increase the frequency that he gets the methotrexate.  Again, he is asymptomatic with this.  Because of this fact, I think we can just watch him for right now.  I spent about an hour with him.  All the time spent face-to-face.  I went over his lab work with him.  I counseled him about the situation.  I answered all of his questions.  I help coordinate his next appointment.  I would like to see him back in 6 weeks.  At some point, if we find that his blood counts continue to decline, we may want to consider a bone marrow test on him so we can see how things look.  Of note, I did send off a NGS study of his blood to see if he has abnormal genetic material in his white blood cells.

## 2018-05-24 DIAGNOSIS — C44319 Basal cell carcinoma of skin of other parts of face: Secondary | ICD-10-CM | POA: Diagnosis not present

## 2018-05-24 LAB — IRON AND TIBC
IRON: 62 ug/dL (ref 42–163)
Saturation Ratios: 19 % — ABNORMAL LOW (ref 42–163)
TIBC: 333 ug/dL (ref 202–409)
UIBC: 270 ug/dL

## 2018-05-24 LAB — FERRITIN: FERRITIN: 97 ng/mL (ref 24–336)

## 2018-05-24 LAB — ERYTHROPOIETIN: Erythropoietin: 30 m[IU]/mL — ABNORMAL HIGH (ref 2.6–18.5)

## 2018-05-30 ENCOUNTER — Ambulatory Visit (INDEPENDENT_AMBULATORY_CARE_PROVIDER_SITE_OTHER): Payer: Medicare Other

## 2018-05-30 ENCOUNTER — Ambulatory Visit (INDEPENDENT_AMBULATORY_CARE_PROVIDER_SITE_OTHER): Payer: Medicare Other | Admitting: Podiatry

## 2018-05-30 ENCOUNTER — Other Ambulatory Visit: Payer: Self-pay | Admitting: Podiatry

## 2018-05-30 DIAGNOSIS — M21611 Bunion of right foot: Secondary | ICD-10-CM

## 2018-05-30 DIAGNOSIS — M21622 Bunionette of left foot: Secondary | ICD-10-CM

## 2018-05-30 DIAGNOSIS — Z9889 Other specified postprocedural states: Secondary | ICD-10-CM

## 2018-05-30 NOTE — Progress Notes (Signed)
Subjective:   Andrew Ramos is a 84 y.o. male who presents for Medicare Annual/Subsequent preventive examination.  Review of Systems: No ROS.  Medicare Wellness Visit. Additional risk factors are reflected in the social history. Cardiac Risk Factors include: advanced age (>80men, >71 women);dyslipidemia;hypertension;male gender;obesity (BMI >30kg/m2);sedentary lifestyle Sleep patterns:    Home Safety/Smoke Alarms: Feels safe in home. Smoke alarms in place. Lives with wife in 1 story home.  Male:      PSA-  Lab Results  Component Value Date   PSA 0.04 (L) 04/16/2018   PSA 0.05 (L) 05/25/2017   PSA 0.08 (L) 10/06/2014       Objective:    Vitals: BP (!) 146/84 (BP Location: Left Arm, Patient Position: Sitting, Cuff Size: Normal)   Pulse 64   Ht 6' (1.829 m)   Wt 228 lb 4.8 oz (103.6 kg)   SpO2 98%   BMI 30.96 kg/m   Body mass index is 30.96 kg/m.  Advanced Directives 05/31/2018 05/23/2018 05/31/2017 05/08/2017 09/29/2016 04/18/2016 10/11/2015  Does Patient Have a Medical Advance Directive? Yes Yes Yes Yes No Yes Yes  Type of Paramedic of [place];Living will [place];Living will - [place];Living will - [place];Living will -  Does patient want to make changes to medical advance directive? No - Patient declined No - Patient declined - - - - -  Copy of [place] in Chart? No - copy requested No - copy requested - No - copy requested - No - copy requested -  Would patient like information on creating a medical advance directive? - No - Patient declined - - No - Patient declined - -  Pre-existing out of facility DNR order (yellow form or pink MOST form) - - - - - - -    Tobacco Social History   Tobacco Use  Smoking Status Never Smoker  Smokeless Tobacco Never Used     Counseling given: Not Answered   Clinical Intake: Pain : No/denies pain  Past Medical History:    Diagnosis Date  . Colon polyps    Tubular Adenoma 2005  . Diverticulosis   . Hepatitis    pt told by red cross has hepatitis antibodies in blood   . Hyperlipidemia   . Hypertension   . Melanoma ([place])   . Nonmelanoma skin cancer   . Osteoarthritis    right hip- none since hip replacement  . Polymyalgia rheumatica ([place])   . Polyposis of colon   . Prostate cancer ([place]) 2007   s/p radiation seed implants  . Prostate cancer ([place])   . Thyroid disease sees dr altzhimer for yearly   nodules   Past Surgical History:  Procedure Laterality Date  . CATARACT EXTRACTION    . POLYPECTOMY    . TONSILLECTOMY  as child  . TOTAL HIP ARTHROPLASTY  08/27/2008   left  . TOTAL HIP ARTHROPLASTY  06/12/2012   Procedure: TOTAL HIP ARTHROPLASTY ANTERIOR APPROACH;  Surgeon: Mauri Pole, MD;  Location: [facility];  Service: Orthopedics;  Laterality: Right;  . TOTAL KNEE ARTHROPLASTY  12/31/2008   left   Family History  Problem Relation Age of Onset  . Coronary artery disease Mother 44  . Heart disease Mother 32       MI  . Coronary artery disease Father 24  . Pancreatic cancer Father 23       deceased 48  . Pancreatic cancer Brother 55  deceased 30; smoker  . Colon cancer Neg Hx   . Colon polyps Neg Hx   . Esophageal cancer Neg Hx   . Kidney disease Neg Hx   . Diabetes Neg Hx   . Gallbladder disease Neg Hx    Social History   Socioeconomic History  . Marital status: Married    Spouse name: Not on file  . Number of children: 2  . Years of education: Not on file  . Highest education level: Not on file  Occupational History  . Occupation: retired Lobbyist: RETIRED  Social Needs  . Financial resource strain: Not on file  . Food insecurity:    Worry: Not on file    Inability: Not on file  . Transportation needs:    Medical: Not on file    Non-medical: Not on file  Tobacco Use  . Smoking status: Never Smoker  . Smokeless tobacco: Never Used  Substance and Sexual  Activity  . Alcohol use: Yes    Alcohol/week: 0.0 standard drinks    Comment: occasional  . Drug use: No  . Sexual activity: Not Currently    Partners: Female  Lifestyle  . Physical activity:    Days per week: Not on file    Minutes per session: Not on file  . Stress: Not on file  Relationships  . Social connections:    Talks on phone: Not on file    Gets together: Not on file    Attends religious service: Not on file    Active member of club or organization: Not on file    Attends meetings of clubs or organizations: Not on file    Relationship status: Not on file  Other Topics Concern  . Not on file  Social History Narrative   Exercise--no    Outpatient Encounter Medications as of 05/31/2018  Medication Sig  . aspirin 81 MG tablet Take 1 tablet (81 mg total) by mouth daily.  Marland Kitchen atorvastatin (LIPITOR) 40 MG tablet TAKE 1 TABLET DAILY  . benazepril-hydrochlorthiazide (LOTENSIN HCT) 20-12.5 MG tablet Take 1 tablet by mouth daily.  . Calcium Carbonate-Vitamin D (CALCIUM 600 + D PO) Take 1 tablet by mouth every morning.   . Cholecalciferol (VITAMIN D3) 1000 UNITS tablet Take 1,000 Units by mouth every morning.   . donepezil (ARICEPT) 5 MG tablet Take 1 tablet (5 mg total) by mouth at bedtime.  . folic acid (FOLVITE) 1 MG tablet Take 1 mg by mouth daily.  . methotrexate (RHEUMATREX) 2.5 MG tablet Take 15 mg by mouth every Wednesday.   . Multiple Vitamins-Minerals (CENTRUM SILVER ULTRA MENS) TABS Take 1 tablet by mouth every evening.   . polyethylene glycol powder (GLYCOLAX/MIRALAX) powder Take 17 g by mouth daily as needed for moderate constipation. 1 scoop daily   . terazosin (HYTRIN) 1 MG capsule TAKE 1 CAPSULE AT BEDTIME  . venlafaxine XR (EFFEXOR-XR) 75 MG 24 hr capsule TAKE 1 CAPSULE DAILY WITH BREAKFAST   No facility-administered encounter medications on file as of 05/31/2018.     Activities of Daily Living In your present state of health, do you have any difficulty  performing the following activities: 05/31/2018  Hearing? Y  Comment wears hearing aids.  Vision? N  Difficulty concentrating or making decisions? N  Walking or climbing stairs? Y  Dressing or bathing? N  Doing errands, shopping? N  Preparing Food and eating ? N  Using the Toilet? N  In the past six months, have you  accidently leaked urine? N  Do you have problems with loss of bowel control? N  Managing your Medications? N  Managing your Finances? N  Housekeeping or managing your Housekeeping? Y  Some recent data might be hidden    Patient Care Team: Carollee Herter, Alferd Apa, DO as PCP - General Hennie Duos, MD as Consulting Physician (Rheumatology) Jari Pigg, MD as Consulting Physician (Dermatology)   Assessment:   This is a routine wellness examination for Andrew Ramos. Physical assessment deferred to PCP.  Exercise Activities and Dietary recommendations Current Exercise Habits: The patient does not participate in regular exercise at present, Exercise limited by: orthopedic condition(s) Diet (meal preparation, eat out, water intake, caffeinated beverages, dairy products, fruits and vegetables): well balanced. Still drinks 6 glasses of water per day.    Goals    . maintain current health (pt-stated)       Fall Risk Fall Risk  05/31/2018 03/02/2018 09/25/2017 05/08/2017 03/20/2017  Falls in the past year? No No Yes Yes Yes  Comment - - - - -  Number falls in past yr: - - 2 or more 1 1  Injury with Fall? - - Yes Yes Yes  Follow up - - - Education provided;Falls prevention discussed -    Depression Screen PHQ 2/9 Scores 05/31/2018 05/08/2017 07/18/2016 04/18/2016  PHQ - 2 Score 0 0 0 0  PHQ- 9 Score - - - 0    Cognitive Function Ad8 score reviewed for issues:  Issues making decisions:no  Less interest in hobbies / activities:no  Repeats questions, stories (family complaining):no  Trouble using ordinary gadgets (microwave, computer, phone):no  Forgets the month or year:  no  Mismanaging finances: no  Remembering appts:no  Daily problems with thinking and/or memory:no Ad8 score is=0  MMSE - Mini Mental State Exam 09/25/2017 05/08/2017 03/20/2017 07/18/2016 04/18/2016  Orientation to time 5 5 5 5 5   Orientation to Place 5 5 5 5 5   Registration 3 3 3 3 3   Attention/ Calculation 5 5 5 5 5   Recall 2 3 3 3 3   Language- name 2 objects 2 2 2 2 2   Language- repeat 1 1 1 1 1   Language- follow 3 step command 3 3 3 3 3   Language- read & follow direction 1 1 1 1 1   Write a sentence 1 1 1 1 1   Copy design 1 1 1 1 1   Total score 29 30 30 30 30    Montreal Cognitive Assessment  09/23/2016  Visuospatial/ Executive (0/5) 5  Naming (0/3) 3  Attention: Read list of digits (0/2) 2  Attention: Read list of letters (0/1) 1  Attention: Serial 7 subtraction starting at 100 (0/3) 3  Language: Repeat phrase (0/2) 2  Language : Fluency (0/1) 0  Abstraction (0/2) 2  Delayed Recall (0/5) 2  Orientation (0/6) 6  Total 26     Screening Tests Health Maintenance  Topic Date Due  . COLONOSCOPY  02/21/2017  . OPHTHALMOLOGY EXAM  04/04/2017  . FOOT EXAM  04/18/2017  . INFLUENZA VACCINE  04/19/2018  . HEMOGLOBIN A1C  10/17/2018  . TETANUS/TDAP  10/11/2025  . PNA vac Low Risk Adult  Completed        Plan:    Please schedule your next medicare wellness visit with me in 1 yr.  Continue to eat heart healthy diet (full of fruits, vegetables, whole grains, lean protein, water--limit salt, fat, and sugar intake) and increase physical activity as tolerated.  Bring a copy of  your living will and/or healthcare power of attorney to your next office visit.    I have personally reviewed and noted the following in the patient's chart:   . Medical and social history . Use of alcohol, tobacco or illicit drugs  . Current medications and supplements . Functional ability and status . Nutritional status . Physical activity . Advanced directives . List of other  physicians . Hospitalizations, surgeries, and ER visits in previous 12 months . Vitals . Screenings to include cognitive, depression, and falls . Referrals and appointments  In addition, I have reviewed and discussed with patient certain preventive protocols, quality metrics, and best practice recommendations. A written personalized care plan for preventive services as well as general preventive health recommendations were provided to patient.     Shela Nevin, [place]  05/31/2018

## 2018-05-31 ENCOUNTER — Encounter: Payer: Self-pay | Admitting: *Deleted

## 2018-05-31 ENCOUNTER — Ambulatory Visit (INDEPENDENT_AMBULATORY_CARE_PROVIDER_SITE_OTHER): Payer: Medicare Other | Admitting: *Deleted

## 2018-05-31 VITALS — BP 146/84 | HR 64 | Ht 72.0 in | Wt 228.3 lb

## 2018-05-31 DIAGNOSIS — Z0000 Encounter for general adult medical examination without abnormal findings: Secondary | ICD-10-CM

## 2018-05-31 NOTE — Patient Instructions (Signed)
Please schedule your next medicare wellness visit with me in 1 yr.  Continue to eat heart healthy diet (full of fruits, vegetables, whole grains, lean protein, water--limit salt, fat, and sugar intake) and increase physical activity as tolerated.  Bring a copy of your living will and/or healthcare power of attorney to your next office visit.   Andrew Ramos , Thank you for taking time to come for your Medicare Wellness Visit. I appreciate your ongoing commitment to your health goals. Please review the following plan we discussed and let me know if I can assist you in the future.   These are the goals we discussed: Goals    . maintain current health (pt-stated)       This is a list of the screening recommended for you and due dates:  Health Maintenance  Topic Date Due  . Colon Cancer Screening  02/21/2017  . Eye exam for diabetics  04/04/2017  . Complete foot exam   04/18/2017  . Flu Shot  04/19/2018  . Hemoglobin A1C  10/17/2018  . Tetanus Vaccine  10/11/2025  . Pneumonia vaccines  Completed    Health Maintenance, Male A healthy lifestyle and preventive care is important for your health and wellness. Ask your health care provider about what schedule of regular examinations is right for you. What should I know about weight and diet? Eat a Healthy Diet  Eat plenty of vegetables, fruits, whole grains, low-fat dairy products, and lean protein.  Do not eat a lot of foods high in solid fats, added sugars, or salt.  Maintain a Healthy Weight Regular exercise can help you achieve or maintain a healthy weight. You should:  Do at least 150 minutes of exercise each week. The exercise should increase your heart rate and make you sweat (moderate-intensity exercise).  Do strength-training exercises at least twice a week.  Watch Your Levels of Cholesterol and Blood Lipids  Have your blood tested for lipids and cholesterol every 5 years starting at 82 years of age. If you are at high  risk for heart disease, you should start having your blood tested when you are 82 years old. You may need to have your cholesterol levels checked more often if: ? Your lipid or cholesterol levels are high. ? You are older than 82 years of age. ? You are at high risk for heart disease.  What should I know about cancer screening? Many types of cancers can be detected early and may often be prevented. Lung Cancer  You should be screened every year for lung cancer if: ? You are a current smoker who has smoked for at least 30 years. ? You are a former smoker who has quit within the past 15 years.  Talk to your health care provider about your screening options, when you should start screening, and how often you should be screened.  Colorectal Cancer  Routine colorectal cancer screening usually begins at 82 years of age and should be repeated every 5-10 years until you are 82 years old. You may need to be screened more often if early forms of precancerous polyps or small growths are found. Your health care provider may recommend screening at an earlier age if you have risk factors for colon cancer.  Your health care provider may recommend using home test kits to check for hidden blood in the stool.  A small camera at the end of a tube can be used to examine your colon (sigmoidoscopy or colonoscopy). This checks for the  earliest forms of colorectal cancer.  Prostate and Testicular Cancer  Depending on your age and overall health, your health care provider may do certain tests to screen for prostate and testicular cancer.  Talk to your health care provider about any symptoms or concerns you have about testicular or prostate cancer.  Skin Cancer  Check your skin from head to toe regularly.  Tell your health care provider about any new moles or changes in moles, especially if: ? There is a change in a mole's size, shape, or color. ? You have a mole that is larger than a pencil  eraser.  Always use sunscreen. Apply sunscreen liberally and repeat throughout the day.  Protect yourself by wearing long sleeves, pants, a wide-brimmed hat, and sunglasses when outside.  What should I know about heart disease, diabetes, and high blood pressure?  If you are 59-86 years of age, have your blood pressure checked every 3-5 years. If you are 50 years of age or older, have your blood pressure checked every year. You should have your blood pressure measured twice-once when you are at a hospital or clinic, and once when you are not at a hospital or clinic. Record the average of the two measurements. To check your blood pressure when you are not at a hospital or clinic, you can use: ? An automated blood pressure machine at a pharmacy. ? A home blood pressure monitor.  Talk to your health care provider about your target blood pressure.  If you are between 57-37 years old, ask your health care provider if you should take aspirin to prevent heart disease.  Have regular diabetes screenings by checking your fasting blood sugar level. ? If you are at a normal weight and have a low risk for diabetes, have this test once every three years after the age of 22. ? If you are overweight and have a high risk for diabetes, consider being tested at a younger age or more often.  A one-time screening for abdominal aortic aneurysm (AAA) by ultrasound is recommended for men aged 7-75 years who are current or former smokers. What should I know about preventing infection? Hepatitis B If you have a higher risk for hepatitis B, you should be screened for this virus. Talk with your health care provider to find out if you are at risk for hepatitis B infection. Hepatitis C Blood testing is recommended for:  Everyone born from [birth year] through 1963.  Anyone with known risk factors for hepatitis C.  Sexually Transmitted Diseases (STDs)  You should be screened each year for STDs including gonorrhea and  chlamydia if: ? You are sexually active and are younger than 82 years of age. ? You are older than 82 years of age and your health care provider tells you that you are at risk for this type of infection. ? Your sexual activity has changed since you were last screened and you are at an increased risk for chlamydia or gonorrhea. Ask your health care provider if you are at risk.  Talk with your health care provider about whether you are at high risk of being infected with HIV. Your health care provider may recommend a prescription medicine to help prevent HIV infection.  What else can I do?  Schedule regular health, dental, and eye exams.  Stay current with your vaccines (immunizations).  Do not use any tobacco products, such as cigarettes, chewing tobacco, and e-cigarettes. If you need help quitting, ask your health care provider.  Limit alcohol  intake to no more than 2 drinks per day. One drink equals 12 ounces of beer, 5 ounces of wine, or 1 ounces of hard liquor.  Do not use street drugs.  Do not share needles.  Ask your health care provider for help if you need support or information about quitting drugs.  Tell your health care provider if you often feel depressed.  Tell your health care provider if you have ever been abused or do not feel safe at home. This information is not intended to replace advice given to you by your health care provider. Make sure you discuss any questions you have with your health care provider. Document Released: 03/03/2008 Document Revised: 05/04/2016 Document Reviewed: 06/09/2015 Elsevier Interactive Patient Education  [employer].

## 2018-05-31 NOTE — Progress Notes (Signed)
Reviewed  Yvonne R Lowne Chase, DO  

## 2018-06-06 NOTE — Progress Notes (Signed)
   Subjective:  Patient presents today status post Tailor's bunionectomy and hammertoe repair of digits 4 and 5 of the left foot. DOS: 05/03/18. He states he is doing very well. He has been using the post op shoe as directed but is hoping to resume wearing his regular sneakers soon. He denies pain of modifying factors. Patient is here for further evaluation and treatment.    Past Medical History:  Diagnosis Date  . Colon polyps    Tubular Adenoma 2005  . Diverticulosis   . Hepatitis    pt told by red cross has hepatitis antibodies in blood   . Hyperlipidemia   . Hypertension   . Melanoma ([place])   . Nonmelanoma skin cancer   . Osteoarthritis    right hip- none since hip replacement  . Polymyalgia rheumatica ([place])   . Polyposis of colon   . Prostate cancer ([place]) 2007   s/p radiation seed implants  . Prostate cancer ([place])   . Thyroid disease sees dr altzhimer for yearly   nodules      Objective/Physical Exam Neurovascular status intact.  Skin incisions appear to be well coapted. No sign of infectious process noted. No dehiscence. No active bleeding noted. Moderate edema noted to the surgical extremity.  Radiographic Exam:  Orthopedic hardware and osteotomies sites appear to be stable with routine healing.   Assessment: 1. s/p Tailor's bunionectomy and hammertoe repair of digits 4 and 5 of the left foot. DOS: 05/03/18.   Plan of Care:  1. Patient was evaluated.  2. Resume wearing good sneakers.  3. May resume full activity with no restrictions.  4. Return to clinic as needed.     Edrick Kins, DPM [facility]  Dr. Edrick Kins, [address]                                        [place], [place] [address]                Office [phone]  Fax [phone]

## 2018-06-07 DIAGNOSIS — M150 Primary generalized (osteo)arthritis: Secondary | ICD-10-CM | POA: Diagnosis not present

## 2018-06-07 DIAGNOSIS — M353 Polymyalgia rheumatica: Secondary | ICD-10-CM | POA: Diagnosis not present

## 2018-06-07 DIAGNOSIS — M810 Age-related osteoporosis without current pathological fracture: Secondary | ICD-10-CM | POA: Diagnosis not present

## 2018-06-07 DIAGNOSIS — E669 Obesity, unspecified: Secondary | ICD-10-CM | POA: Diagnosis not present

## 2018-06-07 DIAGNOSIS — M0609 Rheumatoid arthritis without rheumatoid factor, multiple sites: Secondary | ICD-10-CM | POA: Diagnosis not present

## 2018-06-07 DIAGNOSIS — Z6830 Body mass index (BMI) 30.0-30.9, adult: Secondary | ICD-10-CM | POA: Diagnosis not present

## 2018-06-07 DIAGNOSIS — Z79899 Other long term (current) drug therapy: Secondary | ICD-10-CM | POA: Diagnosis not present

## 2018-06-07 LAB — MYELODYSPLASTIC SYNDROME (MDS) PANEL

## 2018-06-22 ENCOUNTER — Encounter: Payer: Self-pay | Admitting: Podiatry

## 2018-06-22 NOTE — Progress Notes (Signed)
DOS: 05/03/2018 Hammertoe repair 5th LT and possible 4th Tailor's bunionectomy with 5th metatarsal osteotomy LT  Dr. Durenda Guthrie

## 2018-07-02 ENCOUNTER — Encounter: Payer: Self-pay | Admitting: Hematology & Oncology

## 2018-07-02 ENCOUNTER — Other Ambulatory Visit: Payer: Self-pay

## 2018-07-02 ENCOUNTER — Inpatient Hospital Stay: Payer: Medicare Other

## 2018-07-02 ENCOUNTER — Inpatient Hospital Stay: Payer: Medicare Other | Attending: Hematology & Oncology | Admitting: Hematology & Oncology

## 2018-07-02 VITALS — BP 136/78 | HR 74 | Temp 98.0°F | Resp 18 | Wt 225.0 lb

## 2018-07-02 DIAGNOSIS — Z7982 Long term (current) use of aspirin: Secondary | ICD-10-CM | POA: Insufficient documentation

## 2018-07-02 DIAGNOSIS — Z79899 Other long term (current) drug therapy: Secondary | ICD-10-CM | POA: Diagnosis not present

## 2018-07-02 DIAGNOSIS — D61818 Other pancytopenia: Secondary | ICD-10-CM | POA: Diagnosis not present

## 2018-07-02 DIAGNOSIS — D4620 Refractory anemia with excess of blasts, unspecified: Secondary | ICD-10-CM

## 2018-07-02 LAB — CBC WITH DIFFERENTIAL (CANCER CENTER ONLY)
Abs Immature Granulocytes: 0.01 10*3/uL (ref 0.00–0.07)
Basophils Absolute: 0 10*3/uL (ref 0.0–0.1)
Basophils Relative: 0 %
EOS ABS: 0 10*3/uL (ref 0.0–0.5)
EOS PCT: 1 %
HEMATOCRIT: 37 % — AB (ref 39.0–52.0)
Hemoglobin: 12.3 g/dL — ABNORMAL LOW (ref 13.0–17.0)
Immature Granulocytes: 0 %
LYMPHS ABS: 1.6 10*3/uL (ref 0.7–4.0)
Lymphocytes Relative: 48 %
MCH: 33.7 pg (ref 26.0–34.0)
MCHC: 33.2 g/dL (ref 30.0–36.0)
MCV: 101.4 fL — AB (ref 80.0–100.0)
MONOS PCT: 30 %
Monocytes Absolute: 1 10*3/uL (ref 0.1–1.0)
Neutro Abs: 0.7 10*3/uL — ABNORMAL LOW (ref 1.7–7.7)
Neutrophils Relative %: 21 %
Platelet Count: 122 10*3/uL — ABNORMAL LOW (ref 150–400)
RBC: 3.65 MIL/uL — ABNORMAL LOW (ref 4.22–5.81)
RDW: 13.8 % (ref 11.5–15.5)
WBC Count: 3.3 10*3/uL — ABNORMAL LOW (ref 4.0–10.5)
nRBC: 0 % (ref 0.0–0.2)

## 2018-07-02 LAB — CMP (CANCER CENTER ONLY)
ALT: 35 U/L (ref 0–44)
AST: 32 U/L (ref 15–41)
Albumin: 4 g/dL (ref 3.5–5.0)
Alkaline Phosphatase: 61 U/L (ref 38–126)
Anion gap: 9 (ref 5–15)
BUN: 20 mg/dL (ref 8–23)
CHLORIDE: 105 mmol/L (ref 98–111)
CO2: 27 mmol/L (ref 22–32)
CREATININE: 0.95 mg/dL (ref 0.61–1.24)
Calcium: 10.1 mg/dL (ref 8.9–10.3)
GFR, Estimated: >60 mL/min (ref >60)
Glucose, Bld: 94 mg/dL (ref 70–99)
Potassium: 4.4 mmol/L (ref 3.5–5.1)
SODIUM: 141 mmol/L (ref 135–145)
Total Bilirubin: 0.7 mg/dL (ref 0.3–1.2)
Total Protein: 6.5 g/dL (ref 6.5–8.1)

## 2018-07-02 LAB — SAVE SMEAR(SSMR), FOR PROVIDER SLIDE REVIEW

## 2018-07-02 LAB — PLATELET BY CITRATE

## 2018-07-02 LAB — RETICULOCYTES
IMMATURE RETIC FRACT: 17.5 % — AB (ref 2.3–15.9)
RBC.: 3.65 MIL/uL — AB (ref 4.22–5.81)
Retic Count, Absolute: 43.1 10*3/uL (ref 19.0–186.0)
Retic Ct Pct: 1.2 % (ref 0.4–3.1)

## 2018-07-02 NOTE — Progress Notes (Signed)
Hematology and Oncology Follow Up Visit  Andrew Ramos [id number] December 12, 1931 84 y.o. 07/02/2018   Principle Diagnosis:   Pancytopenia-methotrexate induced  Current Therapy:    Observation     Interim History:  Andrew Ramos is back for follow-up.  This is a second office visit.  We saw initially back on early September.  I thought that the pancytopenia that we first saw for was from methotrexate.  His methotrexate dose has been decreased.  He is doing okay with the decreased dose of methotrexate.  His PMR has not really flared up.  He is had no problems with infections.  Is had no rashes.  He has had no cough or shortness of breath.  Is had no change in bowel or bladder habits.  Overall, his performance status is ECOG 1.  Medications:  Current Outpatient Medications:  .  aspirin 81 MG tablet, Take 1 tablet (81 mg total) by mouth daily., Disp: 30 tablet, Rfl:  .  atorvastatin (LIPITOR) 40 MG tablet, TAKE 1 TABLET DAILY, Disp: 90 tablet, Rfl: 4 .  benazepril-hydrochlorthiazide (LOTENSIN HCT) 20-12.5 MG tablet, Take 1 tablet by mouth daily., Disp: 30 tablet, Rfl: 2 .  Calcium Carbonate-Vitamin D (CALCIUM 600 + D PO), Take 1 tablet by mouth every morning. , Disp: , Rfl:  .  Cholecalciferol (VITAMIN D3) 1000 UNITS tablet, Take 1,000 Units by mouth every morning. , Disp: , Rfl:  .  donepezil (ARICEPT) 5 MG tablet, Take 1 tablet (5 mg total) by mouth at bedtime., Disp: 90 tablet, Rfl: 3 .  folic acid (FOLVITE) 1 MG tablet, Take 1 mg by mouth daily., Disp: , Rfl:  .  methotrexate (RHEUMATREX) 2.5 MG tablet, Take 10 mg by mouth once a week., Disp: , Rfl:  .  Multiple Vitamins-Minerals (CENTRUM SILVER ULTRA MENS) TABS, Take 1 tablet by mouth every evening. , Disp: , Rfl:  .  polyethylene glycol powder (GLYCOLAX/MIRALAX) powder, Take 17 g by mouth daily as needed for moderate constipation. 1 scoop daily , Disp: , Rfl:  .  terazosin (HYTRIN) 1 MG capsule, TAKE 1 CAPSULE AT BEDTIME, Disp: 90  capsule, Rfl: 0 .  venlafaxine XR (EFFEXOR-XR) 75 MG 24 hr capsule, TAKE 1 CAPSULE DAILY WITH BREAKFAST, Disp: 90 capsule, Rfl: 1  Allergies: No Known Allergies  Past Medical History, Surgical history, Social history, and Family History were reviewed and updated.  Review of Systems: Review of Systems  Constitutional: Negative.   HENT:  Negative.   Eyes: Negative.   Respiratory: Negative.   Cardiovascular: Negative.   Gastrointestinal: Negative.   Endocrine: Negative.   Genitourinary: Negative.    Musculoskeletal: Negative.   Skin: Negative.   Neurological: Negative.   Hematological: Negative.   Psychiatric/Behavioral: Negative.     Physical Exam:  weight is 225 lb (102.1 kg). His oral temperature is 98 F (36.7 C). His blood pressure is 136/78 and his pulse is 74. His respiration is 18 and oxygen saturation is 97%.   Wt Readings from Last 3 Encounters:  07/02/18 225 lb (102.1 kg)  05/31/18 228 lb 4.8 oz (103.6 kg)  05/23/18 232 lb 4 oz (105.3 kg)    Physical Exam  Constitutional: He is oriented to person, place, and time.  HENT:  Head: Normocephalic and atraumatic.  Mouth/Throat: Oropharynx is clear and moist.  Eyes: Pupils are equal, round, and reactive to light. EOM are normal.  Neck: Normal range of motion.  Cardiovascular: Normal rate, regular rhythm and normal heart sounds.  Pulmonary/Chest: Effort normal  and breath sounds normal.  Abdominal: Soft. Bowel sounds are normal.  Musculoskeletal: Normal range of motion. He exhibits no edema, tenderness or deformity.  Lymphadenopathy:    He has no cervical adenopathy.  Neurological: He is alert and oriented to person, place, and time.  Skin: Skin is warm and dry. No rash noted. No erythema.  Psychiatric: He has a normal mood and affect. His behavior is normal. Judgment and thought content normal.  Vitals reviewed.    Lab Results  Component Value Date   WBC 2.9 (L) 05/23/2018   HGB 11.7 (L) 05/23/2018   HCT 35.9  (L) 05/23/2018   MCV 105.6 (H) 05/23/2018   PLT 111 (L) 05/23/2018     Chemistry      Component Value Date/Time   NA 140 04/16/2018 1549   K 4.3 04/16/2018 1549   CL 104 04/16/2018 1549   CO2 29 04/16/2018 1549   BUN 20 04/16/2018 1549   CREATININE 1.04 04/16/2018 1549   CREATININE 1.28 05/10/2013 1601      Component Value Date/Time   CALCIUM 9.9 04/16/2018 1549   ALKPHOS 71 04/16/2018 1549   AST 18 04/16/2018 1549   ALT 19 04/16/2018 1549   BILITOT 0.5 04/16/2018 1549       Impression and Plan: Andrew Ramos is a 84 year old white male.  He has mild pancytopenia.  It is better today.  His dose of methotrexate has been reduced.  As such, I think that methotrexate is still a prominent factor in the pancytopenia.  I think that we can probably see him back after the holidays now.  Again he has had no symptoms.  His blood counts little bit better.  I looked at his smear under the microscope.  I really do not see anything that looked suspicious.  I am just glad that he is able to help his wife out.  She has health issues.  He has a good guy for doing this.   Volanda Napoleon, MD 10/14/20199:37 AM

## 2018-07-04 ENCOUNTER — Ambulatory Visit (INDEPENDENT_AMBULATORY_CARE_PROVIDER_SITE_OTHER): Payer: Medicare Other

## 2018-07-04 DIAGNOSIS — Z23 Encounter for immunization: Secondary | ICD-10-CM | POA: Diagnosis not present

## 2018-07-25 DIAGNOSIS — D23122 Other benign neoplasm of skin of left lower eyelid, including canthus: Secondary | ICD-10-CM | POA: Diagnosis not present

## 2018-07-25 DIAGNOSIS — H43813 Vitreous degeneration, bilateral: Secondary | ICD-10-CM | POA: Diagnosis not present

## 2018-07-25 DIAGNOSIS — H5211 Myopia, right eye: Secondary | ICD-10-CM | POA: Diagnosis not present

## 2018-07-25 DIAGNOSIS — H35363 Drusen (degenerative) of macula, bilateral: Secondary | ICD-10-CM | POA: Diagnosis not present

## 2018-07-25 DIAGNOSIS — H5202 Hypermetropia, left eye: Secondary | ICD-10-CM | POA: Diagnosis not present

## 2018-07-25 DIAGNOSIS — H40023 Open angle with borderline findings, high risk, bilateral: Secondary | ICD-10-CM | POA: Diagnosis not present

## 2018-07-25 DIAGNOSIS — H02412 Mechanical ptosis of left eyelid: Secondary | ICD-10-CM | POA: Diagnosis not present

## 2018-07-25 DIAGNOSIS — Z961 Presence of intraocular lens: Secondary | ICD-10-CM | POA: Diagnosis not present

## 2018-07-25 DIAGNOSIS — E113292 Type 2 diabetes mellitus with mild nonproliferative diabetic retinopathy without macular edema, left eye: Secondary | ICD-10-CM | POA: Diagnosis not present

## 2018-07-25 DIAGNOSIS — H5032 Intermittent alternating esotropia: Secondary | ICD-10-CM | POA: Diagnosis not present

## 2018-07-25 DIAGNOSIS — D23121 Other benign neoplasm of skin of left upper eyelid, including canthus: Secondary | ICD-10-CM | POA: Diagnosis not present

## 2018-08-01 ENCOUNTER — Telehealth: Payer: Self-pay | Admitting: Neurology

## 2018-08-01 NOTE — Telephone Encounter (Signed)
Dr Bailar is leaving our office to take a job closer to where she lives. We have mailed a letter to patient to let her know we have canceled all appointments with Bailar. Thank you for your understanding °

## 2018-08-16 ENCOUNTER — Other Ambulatory Visit: Payer: Self-pay | Admitting: Family Medicine

## 2018-09-03 ENCOUNTER — Telehealth: Payer: Self-pay | Admitting: Neurology

## 2018-09-03 DIAGNOSIS — E663 Overweight: Secondary | ICD-10-CM | POA: Diagnosis not present

## 2018-09-03 DIAGNOSIS — M810 Age-related osteoporosis without current pathological fracture: Secondary | ICD-10-CM | POA: Diagnosis not present

## 2018-09-03 DIAGNOSIS — M353 Polymyalgia rheumatica: Secondary | ICD-10-CM | POA: Diagnosis not present

## 2018-09-03 DIAGNOSIS — Z6829 Body mass index (BMI) 29.0-29.9, adult: Secondary | ICD-10-CM | POA: Diagnosis not present

## 2018-09-03 DIAGNOSIS — M150 Primary generalized (osteo)arthritis: Secondary | ICD-10-CM | POA: Diagnosis not present

## 2018-09-03 DIAGNOSIS — Z79899 Other long term (current) drug therapy: Secondary | ICD-10-CM | POA: Diagnosis not present

## 2018-09-03 DIAGNOSIS — M0609 Rheumatoid arthritis without rheumatoid factor, multiple sites: Secondary | ICD-10-CM | POA: Diagnosis not present

## 2018-09-03 NOTE — Telephone Encounter (Signed)
Left message regarding letter that was mailed (Dr Si Raider leaving). Explained that we would be happy to refer to another neuropsychologist. Asked to please call us if any questions or if ready to proceed with referral.

## 2018-09-20 ENCOUNTER — Encounter: Payer: Self-pay | Admitting: Family Medicine

## 2018-09-20 ENCOUNTER — Ambulatory Visit (INDEPENDENT_AMBULATORY_CARE_PROVIDER_SITE_OTHER): Payer: Medicare Other | Admitting: Family Medicine

## 2018-09-20 ENCOUNTER — Other Ambulatory Visit: Payer: Self-pay | Admitting: *Deleted

## 2018-09-20 VITALS — BP 136/80 | HR 83 | Temp 97.9°F | Ht 72.0 in | Wt 221.6 lb

## 2018-09-20 DIAGNOSIS — J029 Acute pharyngitis, unspecified: Secondary | ICD-10-CM

## 2018-09-20 DIAGNOSIS — J069 Acute upper respiratory infection, unspecified: Secondary | ICD-10-CM

## 2018-09-20 LAB — POCT RAPID STREP A (OFFICE): Rapid Strep A Screen: NEGATIVE

## 2018-09-20 MED ORDER — IPRATROPIUM BROMIDE 0.03 % NA SOLN: 2.0000 | Freq: Two times a day (BID) | NASAL | 12 refills | Status: DC

## 2018-09-20 MED ORDER — LORATADINE 10 MG PO TABS: 10.0000 mg | ORAL_TABLET | Freq: Every day | ORAL | 11 refills | Status: DC

## 2018-09-20 NOTE — Progress Notes (Signed)
Patient ID: Andrew Ramos, male    DOB: 29-Apr-1932  Age: 85 y.o. MRN: [id number]    Subjective:  Subjective  HPI Braedyn Kauk Ewton presents for cough and sore throat with runny nose.  Mucus is clear.  No otc meds.  No fever.    Review of Systems  Constitutional: Negative for chills and fever.  HENT: Positive for congestion, postnasal drip, sinus pressure and sore throat. Negative for rhinorrhea, sinus pain and sneezing.   Respiratory: Positive for cough. Negative for chest tightness, shortness of breath and wheezing.   Cardiovascular: Negative for chest pain, palpitations and leg swelling.  Allergic/Immunologic: Negative for environmental allergies.    History Past Medical History:  Diagnosis Date  . Colon polyps    Tubular Adenoma 2005  . Diverticulosis   . Hepatitis    pt told by red cross has hepatitis antibodies in blood   . Hyperlipidemia   . Hypertension   . Melanoma ([place])   . Nonmelanoma skin cancer   . Osteoarthritis    right hip- none since hip replacement  . Polymyalgia rheumatica ([place])   . Polyposis of colon   . Prostate cancer ([place]) 2007   s/p radiation seed implants  . Prostate cancer ([place])   . Thyroid disease sees dr altzhimer for yearly   nodules    He has a past surgical history that includes Total knee arthroplasty (12/31/2008); Total hip arthroplasty (08/27/2008); Tonsillectomy (as child); Total hip arthroplasty (06/12/2012); Polypectomy; and Cataract extraction.   His family history includes Coronary artery disease (age of onset: 72) in his mother; Coronary artery disease (age of onset: 62) in his father; Heart disease (age of onset: 30) in his mother; Pancreatic cancer (age of onset: 2) in his brother; Pancreatic cancer (age of onset: 9) in his father.He reports that he has never smoked. He has never used smokeless tobacco. He reports current alcohol use. He reports that he does not use drugs.  Current Outpatient Medications on File Prior to Visit    Medication Sig Dispense Refill  . aspirin 81 MG tablet Take 1 tablet (81 mg total) by mouth daily. 30 tablet   . atorvastatin (LIPITOR) 40 MG tablet TAKE 1 TABLET DAILY 90 tablet 4  . Calcium Carbonate-Vitamin D (CALCIUM 600 + D PO) Take 1 tablet by mouth every morning.     . Cholecalciferol (VITAMIN D3) 1000 UNITS tablet Take 1,000 Units by mouth every morning.     . donepezil (ARICEPT) 5 MG tablet Take 1 tablet (5 mg total) by mouth at bedtime. 90 tablet 3  . folic acid (FOLVITE) 1 MG tablet Take 1 mg by mouth daily.    Marland Kitchen lisinopril-hydrochlorothiazide (PRINZIDE,ZESTORETIC) 10-12.5 MG tablet Take 1 tablet by mouth daily.    . Multiple Vitamins-Minerals (CENTRUM SILVER ULTRA MENS) TABS Take 1 tablet by mouth every evening.     . polyethylene glycol powder (GLYCOLAX/MIRALAX) powder Take 17 g by mouth daily as needed for moderate constipation. 1 scoop daily     . terazosin (HYTRIN) 1 MG capsule Take 1 capsule (1 mg total) by mouth at bedtime. 90 capsule 1  . venlafaxine XR (EFFEXOR-XR) 75 MG 24 hr capsule TAKE 1 CAPSULE DAILY WITH BREAKFAST 90 capsule 1   No current facility-administered medications on file prior to visit.      Objective:  Objective  Physical Exam Vitals signs and nursing note reviewed.  Constitutional:      Appearance: He is well-developed.  HENT:     Right Ear:  Tympanic membrane and external ear normal.     Left Ear: Tympanic membrane and external ear normal.     Nose: Congestion present.     Mouth/Throat:     Pharynx: Posterior oropharyngeal erythema present. No pharyngeal swelling, oropharyngeal exudate or uvula swelling.  Eyes:     General:        Right eye: No discharge.        Left eye: No discharge.     Conjunctiva/sclera: Conjunctivae normal.  Cardiovascular:     Rate and Rhythm: Normal rate and regular rhythm.     Heart sounds: Normal heart sounds. No murmur.  Pulmonary:     Effort: Pulmonary effort is normal. No respiratory distress.     Breath  sounds: Normal breath sounds. No wheezing or rales.  Chest:     Chest wall: No tenderness.  Lymphadenopathy:     Cervical: No cervical adenopathy.  Neurological:     Mental Status: He is alert and oriented to person, place, and time.    BP 136/80 (BP Location: Right Arm, Cuff Size: Normal)   Pulse 83   Temp 97.9 F (36.6 C) (Oral)   Ht 6' (1.829 m)   Wt 221 lb 9.6 oz (100.5 kg)   SpO2 98%   BMI 30.05 kg/m  Wt Readings from Last 3 Encounters:  09/20/18 221 lb 9.6 oz (100.5 kg)  07/02/18 225 lb (102.1 kg)  05/31/18 228 lb 4.8 oz (103.6 kg)     Lab Results  Component Value Date   WBC 3.3 (L) 07/02/2018   HGB 12.3 (L) 07/02/2018   HCT 37.0 (L) 07/02/2018   PLT 122 (L) 07/02/2018   GLUCOSE 94 07/02/2018   CHOL 108 04/16/2018   TRIG 66.0 04/16/2018   HDL 51.10 04/16/2018   LDLCALC 44 04/16/2018   ALT 35 07/02/2018   AST 32 07/02/2018   NA 141 07/02/2018   K 4.4 07/02/2018   CL 105 07/02/2018   CREATININE 0.95 07/02/2018   BUN 20 07/02/2018   CO2 27 07/02/2018   TSH 1.34 07/18/2016   PSA 0.04 (L) 04/16/2018   INR 1.06 09/29/2016   HGBA1C 6.0 04/16/2018   MICROALBUR <0.7 04/06/2015    US Carotid Bilateral  Result Date: 06/01/2017 CLINICAL DATA:  [facility] screening EXAM: BILATERAL CAROTID DUPLEX ULTRASOUND TECHNIQUE: Pearline Cables scale imaging, color Doppler and duplex ultrasound were performed of bilateral carotid and vertebral arteries in the neck. COMPARISON:  None. FINDINGS: Criteria: Quantification of carotid stenosis is based on velocity parameters that correlate the residual internal carotid diameter with NASCET-based stenosis levels, using the diameter of the distal internal carotid lumen as the denominator for stenosis measurement. The following velocity measurements were obtained: RIGHT ICA:  128 cm/sec CCA:  297 cm/sec SYSTOLIC ICA/CCA RATIO:  1.1 DIASTOLIC ICA/CCA RATIO:  1.6 ECA:  93 cm/sec LEFT ICA:  83 cm/sec CCA:  989 cm/sec SYSTOLIC ICA/CCA RATIO:  0.8 DIASTOLIC  ICA/CCA RATIO:  1.3 ECA:  97 cm/sec RIGHT CAROTID ARTERY: Mild focal calcified plaque in the bulb. Low resistance internal carotid Doppler pattern is preserved. RIGHT VERTEBRAL ARTERY:  Antegrade. LEFT CAROTID ARTERY: Little if any plaque in the bulb. Low resistance internal carotid Doppler pattern LEFT VERTEBRAL ARTERY:  Antegrade. IMPRESSION: Less than 50% stenosis in the right and left internal carotid arteries. Electronically Signed   By: Marybelle Killings M.D.   On: 06/01/2017 16:47     Assessment & Plan:  Plan  I have discontinued Malacki C. Loudon's benazepril-hydrochlorthiazide and methotrexate. I  am also having him start on ipratropium and loratadine. Additionally, I am having him maintain his CENTRUM SILVER ULTRA MENS, cholecalciferol, Calcium Carbonate-Vitamin D (CALCIUM 600 + D PO), aspirin, polyethylene glycol powder, folic acid, donepezil, venlafaxine XR, atorvastatin, terazosin, and lisinopril-hydrochlorothiazide.  Meds ordered this encounter  Medications  . ipratropium (ATROVENT) 0.03 % nasal spray    Sig: Place 2 sprays into both nostrils every 12 (twelve) hours.    Dispense:  30 mL    Refill:  12  . loratadine (CLARITIN) 10 MG tablet    Sig: Take 1 tablet (10 mg total) by mouth daily.    Dispense:  30 tablet    Refill:  11    Problem List Items Addressed This Visit    None    Visit Diagnoses    Viral upper respiratory tract infection    -  Primary   Relevant Medications   ipratropium (ATROVENT) 0.03 % nasal spray   loratadine (CLARITIN) 10 MG tablet   Pharyngitis, unspecified etiology       Sore throat       Relevant Orders   POCT rapid strep A (Completed)   Culture, Group A Strep      Follow-up: Return if symptoms worsen or fail to improve.  Ann Held, DO

## 2018-09-20 NOTE — Patient Instructions (Signed)

## 2018-09-21 LAB — CULTURE, GROUP A STREP
MICRO NUMBER:: 2934
SPECIMEN QUALITY:: ADEQUATE

## 2018-09-25 ENCOUNTER — Encounter: Payer: Self-pay | Admitting: *Deleted

## 2018-10-01 ENCOUNTER — Encounter: Payer: Self-pay | Admitting: Hematology & Oncology

## 2018-10-01 ENCOUNTER — Other Ambulatory Visit: Payer: Self-pay

## 2018-10-01 ENCOUNTER — Inpatient Hospital Stay: Payer: Medicare Other

## 2018-10-01 ENCOUNTER — Inpatient Hospital Stay: Payer: Medicare Other | Attending: Hematology & Oncology | Admitting: Hematology & Oncology

## 2018-10-01 VITALS — BP 129/57 | HR 51 | Temp 98.0°F | Resp 16 | Wt 221.2 lb

## 2018-10-01 DIAGNOSIS — D61818 Other pancytopenia: Secondary | ICD-10-CM

## 2018-10-01 DIAGNOSIS — D61810 Antineoplastic chemotherapy induced pancytopenia: Secondary | ICD-10-CM

## 2018-10-01 DIAGNOSIS — M353 Polymyalgia rheumatica: Secondary | ICD-10-CM | POA: Insufficient documentation

## 2018-10-01 DIAGNOSIS — Z79899 Other long term (current) drug therapy: Secondary | ICD-10-CM | POA: Diagnosis not present

## 2018-10-01 DIAGNOSIS — Z7982 Long term (current) use of aspirin: Secondary | ICD-10-CM | POA: Insufficient documentation

## 2018-10-01 LAB — CBC WITH DIFFERENTIAL (CANCER CENTER ONLY)
Abs Immature Granulocytes: 0.03 10*3/uL (ref 0.00–0.07)
BASOS ABS: 0 10*3/uL (ref 0.0–0.1)
Basophils Relative: 0 %
Eosinophils Absolute: 0 10*3/uL (ref 0.0–0.5)
Eosinophils Relative: 1 %
HCT: 38 % — ABNORMAL LOW (ref 39.0–52.0)
Hemoglobin: 12.4 g/dL — ABNORMAL LOW (ref 13.0–17.0)
Immature Granulocytes: 1 %
Lymphocytes Relative: 52 %
Lymphs Abs: 1.9 10*3/uL (ref 0.7–4.0)
MCH: 33.8 pg (ref 26.0–34.0)
MCHC: 32.6 g/dL (ref 30.0–36.0)
MCV: 103.5 fL — ABNORMAL HIGH (ref 80.0–100.0)
Monocytes Absolute: 0.9 10*3/uL (ref 0.1–1.0)
Monocytes Relative: 24 %
NRBC: 0 % (ref 0.0–0.2)
Neutro Abs: 0.8 10*3/uL — ABNORMAL LOW (ref 1.7–7.7)
Neutrophils Relative %: 22 %
Platelet Count: 149 10*3/uL — ABNORMAL LOW (ref 150–400)
RBC: 3.67 MIL/uL — ABNORMAL LOW (ref 4.22–5.81)
RDW: 13.4 % (ref 11.5–15.5)
WBC Count: 3.6 10*3/uL — ABNORMAL LOW (ref 4.0–10.5)

## 2018-10-01 LAB — SAVE SMEAR(SSMR), FOR PROVIDER SLIDE REVIEW

## 2018-10-01 LAB — PLATELET BY CITRATE

## 2018-10-01 NOTE — Progress Notes (Signed)
Hematology and Oncology Follow Up Visit  Andrew Ramos [id number] May 22, 1932 85 y.o. 10/01/2018   Principle Diagnosis:   Pancytopenia-methotrexate induced  Current Therapy:    Observation     Interim History:  Andrew Ramos is back for follow-up.  He is now off methotrexate.  Thankfully, the polymyalgia has not flared up on him.  He had a nice holiday season.  He ate well.  He was with family.  He has had no problems with bleeding.  There is no cough.  He has had no rashes.  He has had no change in bowel or bladder habits.  He has had no headache.  He has had no nausea or vomiting.  Overall, his performance status is ECOG 1.  Medications:  Current Outpatient Medications:  .  aspirin 81 MG tablet, Take 1 tablet (81 mg total) by mouth daily., Disp: 30 tablet, Rfl:  .  atorvastatin (LIPITOR) 40 MG tablet, TAKE 1 TABLET DAILY, Disp: 90 tablet, Rfl: 4 .  Calcium Carbonate-Vitamin D (CALCIUM 600 + D PO), Take 1 tablet by mouth every morning. , Disp: , Rfl:  .  Cholecalciferol (VITAMIN D3) 1000 UNITS tablet, Take 1,000 Units by mouth every morning. , Disp: , Rfl:  .  donepezil (ARICEPT) 5 MG tablet, Take 1 tablet (5 mg total) by mouth at bedtime., Disp: 90 tablet, Rfl: 3 .  folic acid (FOLVITE) 1 MG tablet, Take 1 mg by mouth daily., Disp: , Rfl:  .  lisinopril-hydrochlorothiazide (PRINZIDE,ZESTORETIC) 10-12.5 MG tablet, Take 1 tablet by mouth daily., Disp: , Rfl:  .  Multiple Vitamins-Minerals (CENTRUM SILVER ULTRA MENS) TABS, Take 1 tablet by mouth every evening. , Disp: , Rfl:  .  polyethylene glycol powder (GLYCOLAX/MIRALAX) powder, Take 17 g by mouth daily as needed for moderate constipation. 1 scoop daily , Disp: , Rfl:  .  terazosin (HYTRIN) 1 MG capsule, Take 1 capsule (1 mg total) by mouth at bedtime., Disp: 90 capsule, Rfl: 1 .  venlafaxine XR (EFFEXOR-XR) 75 MG 24 hr capsule, TAKE 1 CAPSULE DAILY WITH BREAKFAST, Disp: 90 capsule, Rfl: 1  Allergies: No Known Allergies  Past  Medical History, Surgical history, Social history, and Family History were reviewed and updated.  Review of Systems: Review of Systems  Constitutional: Negative.   HENT:  Negative.   Eyes: Negative.   Respiratory: Negative.   Cardiovascular: Negative.   Gastrointestinal: Negative.   Endocrine: Negative.   Genitourinary: Negative.    Musculoskeletal: Negative.   Skin: Negative.   Neurological: Negative.   Hematological: Negative.   Psychiatric/Behavioral: Negative.     Physical Exam:  weight is 221 lb 4 oz (100.4 kg). His oral temperature is 98 F (36.7 C). His blood pressure is 129/57 (abnormal) and his pulse is 51 (abnormal). His respiration is 16 and oxygen saturation is 95%.   Wt Readings from Last 3 Encounters:  10/01/18 221 lb 4 oz (100.4 kg)  09/20/18 221 lb 9.6 oz (100.5 kg)  07/02/18 225 lb (102.1 kg)    Physical Exam Vitals signs reviewed.  HENT:     Head: Normocephalic and atraumatic.  Eyes:     Pupils: Pupils are equal, round, and reactive to light.  Neck:     Musculoskeletal: Normal range of motion.  Cardiovascular:     Rate and Rhythm: Normal rate and regular rhythm.     Heart sounds: Normal heart sounds.  Pulmonary:     Effort: Pulmonary effort is normal.     Breath sounds: Normal breath  sounds.  Abdominal:     General: Bowel sounds are normal.     Palpations: Abdomen is soft.  Musculoskeletal: Normal range of motion.        General: No tenderness or deformity.  Lymphadenopathy:     Cervical: No cervical adenopathy.  Skin:    General: Skin is warm and dry.     Findings: No erythema or rash.  Neurological:     Mental Status: He is alert and oriented to person, place, and time.  Psychiatric:        Behavior: Behavior normal.        Thought Content: Thought content normal.        Judgment: Judgment normal.      Lab Results  Component Value Date   WBC 3.3 (L) 07/02/2018   HGB 12.3 (L) 07/02/2018   HCT 37.0 (L) 07/02/2018   MCV 101.4 (H)  07/02/2018   PLT 122 (L) 07/02/2018     Chemistry      Component Value Date/Time   NA 141 07/02/2018 0836   K 4.4 07/02/2018 0836   CL 105 07/02/2018 0836   CO2 27 07/02/2018 0836   BUN 20 07/02/2018 0836   CREATININE 0.95 07/02/2018 0836   CREATININE 1.28 05/10/2013 1601      Component Value Date/Time   CALCIUM 10.1 07/02/2018 0836   ALKPHOS 61 07/02/2018 0836   AST 32 07/02/2018 0836   ALT 35 07/02/2018 0836   BILITOT 0.7 07/02/2018 0836       Impression and Plan: Andrew Ramos is a 85 year old white male.  He has mild pancytopenia.  It is better today.  At this time, he is off methotrexate.  I just do not think that we really need to see him back.  His blood counts are better.  I will be more than happy to see him back at any time if there is a need.  He is happy that he did does not have to come back to see [place].   Volanda Napoleon, MD 1/13/20209:23 AM

## 2018-10-15 ENCOUNTER — Other Ambulatory Visit: Payer: Self-pay | Admitting: Family Medicine

## 2018-10-15 DIAGNOSIS — F32A Depression, unspecified: Secondary | ICD-10-CM

## 2018-10-15 DIAGNOSIS — F329 Major depressive disorder, single episode, unspecified: Secondary | ICD-10-CM

## 2018-11-01 DIAGNOSIS — M353 Polymyalgia rheumatica: Secondary | ICD-10-CM | POA: Diagnosis not present

## 2018-11-01 DIAGNOSIS — M810 Age-related osteoporosis without current pathological fracture: Secondary | ICD-10-CM | POA: Diagnosis not present

## 2018-11-01 DIAGNOSIS — M0609 Rheumatoid arthritis without rheumatoid factor, multiple sites: Secondary | ICD-10-CM | POA: Diagnosis not present

## 2018-11-01 DIAGNOSIS — M65341 Trigger finger, right ring finger: Secondary | ICD-10-CM | POA: Diagnosis not present

## 2018-11-01 DIAGNOSIS — Z6829 Body mass index (BMI) 29.0-29.9, adult: Secondary | ICD-10-CM | POA: Diagnosis not present

## 2018-11-01 DIAGNOSIS — E663 Overweight: Secondary | ICD-10-CM | POA: Diagnosis not present

## 2018-11-01 DIAGNOSIS — M150 Primary generalized (osteo)arthritis: Secondary | ICD-10-CM | POA: Diagnosis not present

## 2018-11-01 DIAGNOSIS — Z79899 Other long term (current) drug therapy: Secondary | ICD-10-CM | POA: Diagnosis not present

## 2018-11-16 DIAGNOSIS — L820 Inflamed seborrheic keratosis: Secondary | ICD-10-CM | POA: Diagnosis not present

## 2018-11-16 DIAGNOSIS — Z85828 Personal history of other malignant neoplasm of skin: Secondary | ICD-10-CM | POA: Diagnosis not present

## 2018-11-16 DIAGNOSIS — Z800 Family history of malignant neoplasm of digestive organs: Secondary | ICD-10-CM | POA: Diagnosis not present

## 2018-11-16 DIAGNOSIS — Z85820 Personal history of malignant melanoma of skin: Secondary | ICD-10-CM | POA: Diagnosis not present

## 2018-11-16 DIAGNOSIS — Z23 Encounter for immunization: Secondary | ICD-10-CM | POA: Diagnosis not present

## 2018-11-16 DIAGNOSIS — D2272 Melanocytic nevi of left lower limb, including hip: Secondary | ICD-10-CM | POA: Diagnosis not present

## 2018-11-16 DIAGNOSIS — C44329 Squamous cell carcinoma of skin of other parts of face: Secondary | ICD-10-CM | POA: Diagnosis not present

## 2018-11-16 DIAGNOSIS — D485 Neoplasm of uncertain behavior of skin: Secondary | ICD-10-CM | POA: Diagnosis not present

## 2018-11-16 DIAGNOSIS — Z86018 Personal history of other benign neoplasm: Secondary | ICD-10-CM | POA: Diagnosis not present

## 2018-11-16 DIAGNOSIS — D225 Melanocytic nevi of trunk: Secondary | ICD-10-CM | POA: Diagnosis not present

## 2018-11-16 DIAGNOSIS — D2271 Melanocytic nevi of right lower limb, including hip: Secondary | ICD-10-CM | POA: Diagnosis not present

## 2018-11-23 ENCOUNTER — Telehealth: Payer: Self-pay | Admitting: *Deleted

## 2018-11-23 NOTE — Telephone Encounter (Signed)
Received Dermatopathology Report results from [facility] forwarded to provider/SLS 03/06

## 2018-12-06 DIAGNOSIS — H40013 Open angle with borderline findings, low risk, bilateral: Secondary | ICD-10-CM | POA: Diagnosis not present

## 2018-12-31 DIAGNOSIS — Z79899 Other long term (current) drug therapy: Secondary | ICD-10-CM | POA: Diagnosis not present

## 2018-12-31 DIAGNOSIS — M0609 Rheumatoid arthritis without rheumatoid factor, multiple sites: Secondary | ICD-10-CM | POA: Diagnosis not present

## 2019-01-16 DIAGNOSIS — M0609 Rheumatoid arthritis without rheumatoid factor, multiple sites: Secondary | ICD-10-CM | POA: Diagnosis not present

## 2019-02-12 DIAGNOSIS — C44320 Squamous cell carcinoma of skin of unspecified parts of face: Secondary | ICD-10-CM | POA: Diagnosis not present

## 2019-02-12 DIAGNOSIS — D0439 Carcinoma in situ of skin of other parts of face: Secondary | ICD-10-CM | POA: Diagnosis not present

## 2019-02-18 ENCOUNTER — Encounter: Payer: Medicare Other | Admitting: Psychology

## 2019-02-25 ENCOUNTER — Encounter: Payer: Medicare Other | Admitting: Psychology

## 2019-02-27 ENCOUNTER — Other Ambulatory Visit: Payer: Self-pay

## 2019-02-28 DIAGNOSIS — M65341 Trigger finger, right ring finger: Secondary | ICD-10-CM | POA: Diagnosis not present

## 2019-02-28 DIAGNOSIS — M810 Age-related osteoporosis without current pathological fracture: Secondary | ICD-10-CM | POA: Diagnosis not present

## 2019-02-28 DIAGNOSIS — M0609 Rheumatoid arthritis without rheumatoid factor, multiple sites: Secondary | ICD-10-CM | POA: Diagnosis not present

## 2019-02-28 DIAGNOSIS — M353 Polymyalgia rheumatica: Secondary | ICD-10-CM | POA: Diagnosis not present

## 2019-02-28 DIAGNOSIS — Z79899 Other long term (current) drug therapy: Secondary | ICD-10-CM | POA: Diagnosis not present

## 2019-02-28 DIAGNOSIS — M150 Primary generalized (osteo)arthritis: Secondary | ICD-10-CM | POA: Diagnosis not present

## 2019-03-01 ENCOUNTER — Other Ambulatory Visit: Payer: Self-pay

## 2019-03-01 ENCOUNTER — Telehealth (INDEPENDENT_AMBULATORY_CARE_PROVIDER_SITE_OTHER): Payer: Medicare Other | Admitting: Neurology

## 2019-03-01 DIAGNOSIS — F0390 Unspecified dementia without behavioral disturbance: Secondary | ICD-10-CM | POA: Diagnosis not present

## 2019-03-01 DIAGNOSIS — F03A0 Unspecified dementia, mild, without behavioral disturbance, psychotic disturbance, mood disturbance, and anxiety: Secondary | ICD-10-CM

## 2019-03-01 DIAGNOSIS — L988 Other specified disorders of the skin and subcutaneous tissue: Secondary | ICD-10-CM | POA: Diagnosis not present

## 2019-03-01 DIAGNOSIS — D485 Neoplasm of uncertain behavior of skin: Secondary | ICD-10-CM | POA: Diagnosis not present

## 2019-03-01 DIAGNOSIS — D225 Melanocytic nevi of trunk: Secondary | ICD-10-CM | POA: Diagnosis not present

## 2019-03-01 MED ORDER — DONEPEZIL HCL 10 MG PO TABS: 10.0000 mg | ORAL_TABLET | Freq: Every day | ORAL | 3 refills | Status: DC

## 2019-03-01 NOTE — Progress Notes (Signed)
Virtual Visit via Telephone Note The purpose of this virtual visit is to provide medical care while limiting exposure to the novel coronavirus.    Consent was obtained for phone visit:  Yes.   Answered questions that patient had about telehealth interaction:  Yes.   I discussed the limitations, risks, security and privacy concerns of performing an evaluation and management service by telephone. I also discussed with the patient that there may be a patient responsible charge related to this service. The patient expressed understanding and agreed to proceed.  Pt location: Home Physician Location: office Name of referring provider:  Ann Ramos, * I connected with .Andrew Ramos at patients initiation/request on 03/01/2019 at  9:30 AM EDT by telephone and verified that I am speaking with the correct person using two identifiers.  Pt MRN:  [id number] Pt DOB:  01/14/1932   History of Present Illness:  The patient was last seen a year ago for worsening memory. His wife is present during the phone visit. I spoke to his son Andrew Ramos [phone]) separately as well, after obtaining the patient's verbal consent. When asked about his memory, he states "I have been questioned by my wife and son about that, so I do question myself." He states that he gets excited when people want 3 or 4 things from him, so he tends to forget one of them. His wife reports that she tells him to get 3 things from the kitchen, then he would come back with something different, or he would ask her if she wants mayonnaise when she has always had mayo on her sandwich for the past 30 years. He and his wife have had a challenging marriage per son, in the past we had discussed couples therapy. His wife reports that "for 30 years he kept everything a secret," she found out that he had up to $70K in credit card debt over the past 10 years and he did not discuss this with her. Over the past several months, he has been a  victim of scammers online and on the phone. He received a phone call that his grandson was in jail and gave money. His wife reports that this past month, he went on the computer and gave away $10K for no reason, he bought a bunch of gift cards and wiped out her checking account. They had to change her account number but the fraud department said it was his fault. He would get upset at her that there is no money in the account. She is upset with him that they had just discussed that she had saved up enough money then he gave it away. He has several purchases from [employer], saying he is trying to win money. Andrew Ramos reports that he has been negligent with getting his taxes done for the past few years. They have tried implementing taking away his debit card and giving him cash but have had difficulty. His son is also concerned about driving. The patient reports that he went through a yellow light in [place]99 with the police sitting there and was stopped. He denies getting lost driving. He denies missing medications, his wife agrees he is good with medications. He misplaces things. His wife reports he has 50% hearing loss and his hearing aids are being fixed. He is more irritable. Sleep is good. No paranoia or hallucinations. He is independent with dressing and bathing. He is on Donepezil 5mg  daily without side effects.  History on Initial Assessment 09/23/2016:  This is a pleasant 85 yo RH man with a history of hypertension, hyperlipidemia, prostate cancer, melanoma, presenting for evaluation of worsening memory and personality changes. Andrew Ramos himself thinks that he is doing okay, but "people keep telling me I have too many things to think about." He only mostly notices memory problems when he is in a hurry. He states he occasionally forgets conversations but feels this is due to his poor hearing. He denies getting lost driving, no missed medications. He has missed some bill payments a couple of years ago,  none recently. His son states he is not nearly as sharp as he once was, he misplaces things frequently and asks the same questions repeatedly. He relates what the patient's wife has told him, that he is more argumentative. He has left the stove burner on at least twice in the past couple of weeks. He has walked away from the sink running. They have noticed more dents on the car, which the patient is unsure how they happened. They both wonder if these were from parking lot incidents because he states he has never hit another car. No difficulties with dressing/bathing independently, hygiene is good. On review of PCP notes where wife was present, she reported he forgets to zip his pants, he forgets things he has offered his wife. He brings the wrong utensils to eat with. He forgets simple things his wife asks for. He states that she give him too many instructions. He loses temper with his wife frequently, they are fighting more and she is tired of people telling her to be more patient with him. He was noted to have a blunt affect with paranoid thought content on his PCP visit. MMSE in 06/2016 was 30/30. He was started on Donepezil 5mg  daily, which he is tolerating without side effects.   He denies any headaches, dizziness, diplopia, dysarthria, dysphagia, neck/back pain, focal numbness/tingling/weakness, bladder dysfunction. No anosmia, tremors, no falls. He has occasional constipation. His brother who is 7 years younger than him was diagnosed with dementia after he got lost driving and could not be found for 2 weeks. He denies any significant head injuries, he drinks alcohol very occasionally.   Diagnostic Data: MRI brain with and without contrast did not show any acute changes. There was moderate diffuse volume loss and mild chronic microvascular disease.  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 Results  and level of functioning did not warrant a diagnosis of dementia. No signs of a primary psychiatric disorder. Increased irritability/mood changes are likely related to MCI and frontal-subcortical involvement.    Observations/Objective:  Limited due to nature of phone visit. Patient is awake, alert, able to answer questions appropriately.   Montreal Cognitive Assessment Blind 03/01/2019 ([place] Blind done over phone) 09/23/2016  Attention: Read list of digits (0/2) 2 2  Attention: Read list of letters (0/1) 1 1  Attention: Serial 7 subtraction starting at 100 (0/3) 3 3  Language: Repeat phrase (0/2) 0 2  Language : Fluency (0/1) 0 0  Abstraction (0/2) 0 2  Delayed Recall (0/5) 0 2  Orientation (0/6) 6 6  Total 12 -  Adjusted Score (based on education) 13/22 -   Assessment and Plan:   This is a pleasant 85 yo RH man with hypertension, hyperlipidemia, prostate cancer, melanoma, with continued cognitive decline since his last visit a year ago. MRI brain unremarkable. TSH and B12 normal. Neuropsychological testing in April 2019 indicated non-amnestic mild  cognitive impairment (dysexecutive features - likely vascular cognitive impairment), however symptoms have progressed since then. He now has difficulties managing finances and has been a victim of several scams. [place] Blind (done over the phone) today 13/22, symptoms indicating Mild dementia, likely vascular. He has minimal insight into his condition. I had an extensive discussion with both the patient and his wife and son about looking into having his son manage finances, since the patient and his wife have had a "challenging" relationship over the years and this has become a continued matter of argument between them. His son would like a driving assessment and is also interested in a competency evaluation. Increase Donepezil to 10mg  daily. Continue close family supervision.   Follow Up Instructions:   -I discussed the assessment and treatment plan with  the patient/family. The patient/family were provided an opportunity to ask questions and all were answered. The patient/family agreed with the plan and demonstrated an understanding of the instructions.   The patient was advised to call back or seek an in-person evaluation if the symptoms worsen or if the condition fails to improve as anticipated.    Total Time spent in visit with the patient was:  30 minutes, of which 100% of the time was spent in counseling and/or coordinating care on the above.   Pt understands and agrees with the plan of care outlined.     Cameron Sprang, MD

## 2019-03-04 ENCOUNTER — Ambulatory Visit: Payer: Self-pay | Admitting: Neurology

## 2019-03-06 ENCOUNTER — Other Ambulatory Visit: Payer: Self-pay | Admitting: Physician Assistant

## 2019-03-06 DIAGNOSIS — M8000XA Age-related osteoporosis with current pathological fracture, unspecified site, initial encounter for fracture: Secondary | ICD-10-CM

## 2019-04-24 ENCOUNTER — Other Ambulatory Visit: Payer: Self-pay | Admitting: Family Medicine

## 2019-05-23 ENCOUNTER — Telehealth: Payer: Self-pay | Admitting: Family Medicine

## 2019-05-23 NOTE — Telephone Encounter (Signed)
Called patient to schedule AWV, no answer. Will try to call patient back at later time. SF °

## 2019-05-30 DIAGNOSIS — D2271 Melanocytic nevi of right lower limb, including hip: Secondary | ICD-10-CM | POA: Diagnosis not present

## 2019-05-30 DIAGNOSIS — D2272 Melanocytic nevi of left lower limb, including hip: Secondary | ICD-10-CM | POA: Diagnosis not present

## 2019-05-30 DIAGNOSIS — D225 Melanocytic nevi of trunk: Secondary | ICD-10-CM | POA: Diagnosis not present

## 2019-05-30 DIAGNOSIS — L570 Actinic keratosis: Secondary | ICD-10-CM | POA: Diagnosis not present

## 2019-05-30 DIAGNOSIS — Z23 Encounter for immunization: Secondary | ICD-10-CM | POA: Diagnosis not present

## 2019-05-30 DIAGNOSIS — Z86018 Personal history of other benign neoplasm: Secondary | ICD-10-CM | POA: Diagnosis not present

## 2019-05-30 DIAGNOSIS — Z85828 Personal history of other malignant neoplasm of skin: Secondary | ICD-10-CM | POA: Diagnosis not present

## 2019-05-30 DIAGNOSIS — D485 Neoplasm of uncertain behavior of skin: Secondary | ICD-10-CM | POA: Diagnosis not present

## 2019-05-30 DIAGNOSIS — Z800 Family history of malignant neoplasm of digestive organs: Secondary | ICD-10-CM | POA: Diagnosis not present

## 2019-05-30 DIAGNOSIS — Z85820 Personal history of malignant melanoma of skin: Secondary | ICD-10-CM | POA: Diagnosis not present

## 2019-05-31 DIAGNOSIS — M0609 Rheumatoid arthritis without rheumatoid factor, multiple sites: Secondary | ICD-10-CM | POA: Diagnosis not present

## 2019-05-31 DIAGNOSIS — M65341 Trigger finger, right ring finger: Secondary | ICD-10-CM | POA: Diagnosis not present

## 2019-05-31 DIAGNOSIS — M150 Primary generalized (osteo)arthritis: Secondary | ICD-10-CM | POA: Diagnosis not present

## 2019-05-31 DIAGNOSIS — M353 Polymyalgia rheumatica: Secondary | ICD-10-CM | POA: Diagnosis not present

## 2019-05-31 DIAGNOSIS — Z79899 Other long term (current) drug therapy: Secondary | ICD-10-CM | POA: Diagnosis not present

## 2019-05-31 DIAGNOSIS — M810 Age-related osteoporosis without current pathological fracture: Secondary | ICD-10-CM | POA: Diagnosis not present

## 2019-06-13 DIAGNOSIS — D23122 Other benign neoplasm of skin of left lower eyelid, including canthus: Secondary | ICD-10-CM | POA: Diagnosis not present

## 2019-06-13 DIAGNOSIS — Z961 Presence of intraocular lens: Secondary | ICD-10-CM | POA: Diagnosis not present

## 2019-06-13 DIAGNOSIS — H40013 Open angle with borderline findings, low risk, bilateral: Secondary | ICD-10-CM | POA: Diagnosis not present

## 2019-06-13 DIAGNOSIS — E113292 Type 2 diabetes mellitus with mild nonproliferative diabetic retinopathy without macular edema, left eye: Secondary | ICD-10-CM | POA: Diagnosis not present

## 2019-06-13 DIAGNOSIS — H43813 Vitreous degeneration, bilateral: Secondary | ICD-10-CM | POA: Diagnosis not present

## 2019-06-13 DIAGNOSIS — H5211 Myopia, right eye: Secondary | ICD-10-CM | POA: Diagnosis not present

## 2019-06-13 DIAGNOSIS — H5202 Hypermetropia, left eye: Secondary | ICD-10-CM | POA: Diagnosis not present

## 2019-06-13 DIAGNOSIS — H35363 Drusen (degenerative) of macula, bilateral: Secondary | ICD-10-CM | POA: Diagnosis not present

## 2019-06-13 DIAGNOSIS — D23121 Other benign neoplasm of skin of left upper eyelid, including canthus: Secondary | ICD-10-CM | POA: Diagnosis not present

## 2019-07-08 ENCOUNTER — Encounter: Payer: Self-pay | Admitting: Family Medicine

## 2019-07-08 ENCOUNTER — Telehealth: Payer: Self-pay | Admitting: Family Medicine

## 2019-07-08 ENCOUNTER — Other Ambulatory Visit: Payer: Self-pay

## 2019-07-08 ENCOUNTER — Encounter: Payer: Medicare Other | Admitting: Family Medicine

## 2019-07-08 ENCOUNTER — Ambulatory Visit (INDEPENDENT_AMBULATORY_CARE_PROVIDER_SITE_OTHER): Payer: Medicare Other | Admitting: Family Medicine

## 2019-07-08 VITALS — Temp 97.3°F | Ht 72.0 in

## 2019-07-08 DIAGNOSIS — K5901 Slow transit constipation: Secondary | ICD-10-CM | POA: Diagnosis not present

## 2019-07-08 DIAGNOSIS — J3089 Other allergic rhinitis: Secondary | ICD-10-CM

## 2019-07-08 MED ORDER — LORATADINE 10 MG PO TABS: 10.0000 mg | ORAL_TABLET | Freq: Every day | ORAL | 11 refills | Status: DC

## 2019-07-08 MED ORDER — FLUTICASONE PROPIONATE 50 MCG/ACT NA SUSP: 2.0000 | Freq: Every day | NASAL | 6 refills | Status: DC

## 2019-07-08 NOTE — Progress Notes (Signed)
Virtual Visit via Telephone Note  I connected with Andrew Ramos on 07/08/19 at 10:40 AM EDT by telephone and verified that I am speaking with the correct person using two identifiers.  Location: Patient: home  Provider: office    I discussed the limitations, risks, security and privacy concerns of performing an evaluation and management service by telephone and the availability of in person appointments. I also discussed with the patient that there may be a patient responsible charge related to this service. The patient expressed understanding and agreed to proceed.   History of Present Illness:   pt is home with his wife c/o constipation -- he has been occasionally taking miralax with some relief.  He has been adding fiber to his diet and drinking water  He also c/o running nose x years--- only r nostril    He has not taking any otc meds No fevers  No sinus headache, drainage Observations/Objective: Vitals:   07/08/19 1035  Temp: (!) 97.3 F (36.3 C)   Pt denies fever Pt is in NAD   Assessment and Plan: 1. Non-seasonal allergic rhinitis, unspecified trigger Antihistamine and floase Consider ent if no better  - loratadine (CLARITIN) 10 MG tablet; Take 1 tablet (10 mg total) by mouth daily.  Dispense: 30 tablet; Refill: 11 - fluticasone (FLONASE) 50 MCG/ACT nasal spray; Place 2 sprays into both nostrils daily.  Dispense: 16 g; Refill: 6  2. Slow transit constipation miralax daily  Inc water intake con't fiber in diet  Call if no improvement    Follow Up Instructions:    I discussed the assessment and treatment plan with the patient. The patient was provided an opportunity to ask questions and all were answered. The patient agreed with the plan and demonstrated an understanding of the instructions.   The patient was advised to call back or seek an in-person evaluation if the symptoms worsen or if the condition fails to improve as anticipated.  I provided 15 minutes of  non-face-to-face time during this encounter.   Ann Held, DO

## 2019-07-08 NOTE — Telephone Encounter (Signed)
Unable to LVM to schedule HD flu shot in 2 weeks (1st week in November)

## 2019-07-17 ENCOUNTER — Ambulatory Visit (INDEPENDENT_AMBULATORY_CARE_PROVIDER_SITE_OTHER): Payer: Medicare Other

## 2019-07-17 ENCOUNTER — Other Ambulatory Visit: Payer: Self-pay

## 2019-07-17 DIAGNOSIS — Z23 Encounter for immunization: Secondary | ICD-10-CM | POA: Diagnosis not present

## 2019-07-17 NOTE — Progress Notes (Signed)
imm

## 2019-07-22 ENCOUNTER — Other Ambulatory Visit: Payer: Self-pay | Admitting: Family Medicine

## 2019-07-22 ENCOUNTER — Other Ambulatory Visit: Payer: Self-pay

## 2019-07-22 ENCOUNTER — Other Ambulatory Visit: Payer: Self-pay | Admitting: Neurology

## 2019-07-22 MED ORDER — DONEPEZIL HCL 10 MG PO TABS: 10.0000 mg | ORAL_TABLET | Freq: Every day | ORAL | 3 refills | Status: DC

## 2019-07-23 ENCOUNTER — Other Ambulatory Visit: Payer: Self-pay | Admitting: *Deleted

## 2019-07-23 DIAGNOSIS — J3089 Other allergic rhinitis: Secondary | ICD-10-CM

## 2019-07-23 MED ORDER — LORATADINE 10 MG PO TABS: 10.0000 mg | ORAL_TABLET | Freq: Every day | ORAL | 1 refills | Status: DC

## 2019-07-26 ENCOUNTER — Other Ambulatory Visit: Payer: Self-pay

## 2019-07-26 ENCOUNTER — Ambulatory Visit (HOSPITAL_BASED_OUTPATIENT_CLINIC_OR_DEPARTMENT_OTHER)
Admission: RE | Admit: 2019-07-26 | Discharge: 2019-07-26 | Disposition: A | Discharge status: Home / Self Care | Payer: Medicare Other | Source: Ambulatory Visit | Attending: Physician Assistant | Admitting: Physician Assistant

## 2019-07-26 DIAGNOSIS — M810 Age-related osteoporosis without current pathological fracture: Secondary | ICD-10-CM | POA: Diagnosis not present

## 2019-07-26 DIAGNOSIS — Z13820 Encounter for screening for osteoporosis: Secondary | ICD-10-CM | POA: Insufficient documentation

## 2019-07-26 DIAGNOSIS — M8000XA Age-related osteoporosis with current pathological fracture, unspecified site, initial encounter for fracture: Secondary | ICD-10-CM

## 2019-09-02 ENCOUNTER — Other Ambulatory Visit: Payer: Medicare Other

## 2019-09-02 NOTE — Progress Notes (Signed)
This encounter was created in error - please disregard.

## 2019-10-08 ENCOUNTER — Encounter: Payer: Self-pay | Admitting: Neurology

## 2019-10-08 ENCOUNTER — Other Ambulatory Visit: Payer: Self-pay

## 2019-10-08 ENCOUNTER — Telehealth (INDEPENDENT_AMBULATORY_CARE_PROVIDER_SITE_OTHER): Payer: Medicare Other | Admitting: Neurology

## 2019-10-08 VITALS — Ht 72.0 in | Wt 220.0 lb

## 2019-10-08 DIAGNOSIS — F0390 Unspecified dementia without behavioral disturbance: Secondary | ICD-10-CM

## 2019-10-08 DIAGNOSIS — F03A0 Unspecified dementia, mild, without behavioral disturbance, psychotic disturbance, mood disturbance, and anxiety: Secondary | ICD-10-CM

## 2019-10-08 NOTE — Progress Notes (Signed)
Virtual Visit via Telephone Note The purpose of this virtual visit is to provide medical care while limiting exposure to the novel coronavirus.    Consent was obtained for phone visit:  Yes.   Answered questions that patient had about telehealth interaction:  Yes.   I discussed the limitations, risks, security and privacy concerns of performing an evaluation and management service by telephone. I also discussed with the patient that there may be a patient responsible charge related to this service. The patient expressed understanding and agreed to proceed.  Pt location: Home Physician Location: office Name of referring provider:  Ann Held, * I connected with .Andrew Ramos at patients initiation/request on 10/08/2019 at  4:00 PM EST by telephone and verified that I am speaking with the correct person using two identifiers.  Pt MRN:  [id number] Pt DOB:  10-30-1931   History of Present Illness:  The patient had a telephone visit on 10/08/2019. He was last evaluated in the neurology clinic via telephone 7 months ago for dementia. His wife is present during the visit today to provide additional information. On his last visit, his son reported increasing issues giving away money to phone scams. His wife reports she manages all finances now, they have changed their phone number, no further issues. He continues to manage his own medications filling his pillbox every 2 weeks and denies any issues. He denies getting lost driving. His son had expressed concern on last visit, his wife states she gets nervous riding with him but denies any accidents or getting lost. She reports he loses his patience too fast, no paranoia or hallucinations. He is independent with dressing and bathing. He denies any headaches, dizziness, vision changes, focal numbness/tingling/weakness, no falls. Sleep is good. He is on Donepezil 10mg  daily without side effects.  History on Initial Assessment 09/23/2016: This is a  pleasant 86 yo RH man with a history of hypertension, hyperlipidemia, prostate cancer, melanoma, presenting for evaluation of worsening memory and personality changes. Andrew Ramos himself thinks that he is doing okay, but "people keep telling me I have too many things to think about." He only mostly notices memory problems when he is in a hurry. He states he occasionally forgets conversations but feels this is due to his poor hearing. He denies getting lost driving, no missed medications. He has missed some bill payments a couple of years ago, none recently. His son states he is not nearly as sharp as he once was, he misplaces things frequently and asks the same questions repeatedly. He relates what the patient's wife has told him, that he is more argumentative. He has left the stove burner on at least twice in the past couple of weeks. He has walked away from the sink running. They have noticed more dents on the car, which the patient is unsure how they happened. They both wonder if these were from parking lot incidents because he states he has never hit another car. No difficulties with dressing/bathing independently, hygiene is good. On review of PCP notes where wife was present, she reported he forgets to zip his pants, he forgets things he has offered his wife. He brings the wrong utensils to eat with. He forgets simple things his wife asks for. He states that she give him too many instructions. He loses temper with his wife frequently, they are fighting more and she is tired of people telling her to be more patient with him. He was noted to have a  blunt affect with paranoid thought content on his PCP visit. MMSE in 06/2016 was 30/30. He was started on Donepezil 5mg  daily, which he is tolerating without side effects.   He denies any headaches, dizziness, diplopia, dysarthria, dysphagia, neck/back pain, focal numbness/tingling/weakness, bladder dysfunction. No anosmia, tremors, no falls. He has occasional  constipation. His brother who is 7 years younger than him was diagnosed with dementia after he got lost driving and could not be found for 2 weeks. He denies any significant head injuries, he drinks alcohol very occasionally.   Diagnostic Data: MRI brain with and without contrast did not show any acute changes. There was moderate diffuse volume loss and mild chronic microvascular disease.  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 Results and level of functioning did not warrant a diagnosis of dementia. No signs of a primary psychiatric disorder. Increased irritability/mood changes are likely related to MCI and frontal-subcortical involvement.    Observations/Objective:  Limited due to nature of phone visit. Patient is awake, alert, able to answer questions appropriately.   Montreal Cognitive Assessment Blind 10/08/2019 03/01/2019 09/23/2016  Attention: Read list of digits (0/2) 2 2 2   Attention: Read list of letters (0/1) 1 1 1   Attention: Serial 7 subtraction starting at 100 (0/3) 3 3 3   Language: Repeat phrase (0/2) 0 0 2  Language : Fluency (0/1) 0 0 0  Abstraction (0/2) 1 0 2  Delayed Recall (0/5) 5 0 2  Orientation (0/6) 6 6 6   Total 18 12 -  Adjusted Score (based on education) - 13 -    Assessment and Plan:   This is a pleasant 86 yo RH man with hypertension, hyperlipidemia, prostate cancer, melanoma, with mild vascular dementia. MOCA blind today 18/22 (13/22 in June 2020). He is on Donepezil 10mg  daily. Symptoms overall stable. Continue close supervision, continue to monitor driving. Follow-up in 6 months, they know to call for any changes.    Follow Up Instructions:  -I discussed the assessment and treatment plan with the patient/family. The patient/family were provided an opportunity to ask questions and all were answered. The patient/family agreed with the plan and demonstrated an  understanding of the instructions.   The patient was advised to call back or seek an in-person evaluation if the symptoms worsen or if the condition fails to improve as anticipated.    Total Time spent in visit with the patient was:  25:19 minutes, of which 100% of the time was spent in counseling and/or coordinating care on the above.   Pt understands and agrees with the plan of care outlined.     Cameron Sprang, MD

## 2019-10-29 ENCOUNTER — Telehealth: Payer: Self-pay | Admitting: Family Medicine

## 2019-10-29 NOTE — Telephone Encounter (Signed)
Pt would like to know if ok for him to get the COVID-19 vaccine given medication history and current medications.   Please call [phone]

## 2019-10-29 NOTE — Telephone Encounter (Signed)
yes

## 2019-10-29 NOTE — Telephone Encounter (Signed)
Please advise 

## 2019-10-30 NOTE — Telephone Encounter (Signed)
Pt advised.

## 2019-11-07 ENCOUNTER — Other Ambulatory Visit: Payer: Self-pay | Admitting: Family Medicine

## 2019-11-07 DIAGNOSIS — F32A Depression, unspecified: Secondary | ICD-10-CM

## 2019-11-07 DIAGNOSIS — F329 Major depressive disorder, single episode, unspecified: Secondary | ICD-10-CM

## 2019-11-29 DIAGNOSIS — M2550 Pain in unspecified joint: Secondary | ICD-10-CM | POA: Diagnosis not present

## 2019-11-29 DIAGNOSIS — M65341 Trigger finger, right ring finger: Secondary | ICD-10-CM | POA: Diagnosis not present

## 2019-11-29 DIAGNOSIS — Z6829 Body mass index (BMI) 29.0-29.9, adult: Secondary | ICD-10-CM | POA: Diagnosis not present

## 2019-11-29 DIAGNOSIS — M810 Age-related osteoporosis without current pathological fracture: Secondary | ICD-10-CM | POA: Diagnosis not present

## 2019-11-29 DIAGNOSIS — Z79899 Other long term (current) drug therapy: Secondary | ICD-10-CM | POA: Diagnosis not present

## 2019-11-29 DIAGNOSIS — E663 Overweight: Secondary | ICD-10-CM | POA: Diagnosis not present

## 2019-11-29 DIAGNOSIS — M150 Primary generalized (osteo)arthritis: Secondary | ICD-10-CM | POA: Diagnosis not present

## 2019-11-29 DIAGNOSIS — M353 Polymyalgia rheumatica: Secondary | ICD-10-CM | POA: Diagnosis not present

## 2019-12-04 DIAGNOSIS — L821 Other seborrheic keratosis: Secondary | ICD-10-CM | POA: Diagnosis not present

## 2019-12-04 DIAGNOSIS — Z800 Family history of malignant neoplasm of digestive organs: Secondary | ICD-10-CM | POA: Diagnosis not present

## 2019-12-04 DIAGNOSIS — Z86018 Personal history of other benign neoplasm: Secondary | ICD-10-CM | POA: Diagnosis not present

## 2019-12-04 DIAGNOSIS — D225 Melanocytic nevi of trunk: Secondary | ICD-10-CM | POA: Diagnosis not present

## 2019-12-04 DIAGNOSIS — D2271 Melanocytic nevi of right lower limb, including hip: Secondary | ICD-10-CM | POA: Diagnosis not present

## 2019-12-04 DIAGNOSIS — Z85820 Personal history of malignant melanoma of skin: Secondary | ICD-10-CM | POA: Diagnosis not present

## 2019-12-04 DIAGNOSIS — Z85828 Personal history of other malignant neoplasm of skin: Secondary | ICD-10-CM | POA: Diagnosis not present

## 2019-12-04 DIAGNOSIS — D2272 Melanocytic nevi of left lower limb, including hip: Secondary | ICD-10-CM | POA: Diagnosis not present

## 2019-12-04 DIAGNOSIS — Z23 Encounter for immunization: Secondary | ICD-10-CM | POA: Diagnosis not present

## 2019-12-11 DIAGNOSIS — D23121 Other benign neoplasm of skin of left upper eyelid, including canthus: Secondary | ICD-10-CM | POA: Diagnosis not present

## 2019-12-11 DIAGNOSIS — H52223 Regular astigmatism, bilateral: Secondary | ICD-10-CM | POA: Diagnosis not present

## 2019-12-11 DIAGNOSIS — H5202 Hypermetropia, left eye: Secondary | ICD-10-CM | POA: Diagnosis not present

## 2019-12-11 DIAGNOSIS — Z961 Presence of intraocular lens: Secondary | ICD-10-CM | POA: Diagnosis not present

## 2019-12-11 DIAGNOSIS — H35363 Drusen (degenerative) of macula, bilateral: Secondary | ICD-10-CM | POA: Diagnosis not present

## 2019-12-11 DIAGNOSIS — H524 Presbyopia: Secondary | ICD-10-CM | POA: Diagnosis not present

## 2019-12-11 DIAGNOSIS — H40023 Open angle with borderline findings, high risk, bilateral: Secondary | ICD-10-CM | POA: Diagnosis not present

## 2020-01-17 DIAGNOSIS — M810 Age-related osteoporosis without current pathological fracture: Secondary | ICD-10-CM | POA: Diagnosis not present

## 2020-01-17 DIAGNOSIS — Z79899 Other long term (current) drug therapy: Secondary | ICD-10-CM | POA: Diagnosis not present

## 2020-01-17 DIAGNOSIS — M353 Polymyalgia rheumatica: Secondary | ICD-10-CM | POA: Diagnosis not present

## 2020-01-17 DIAGNOSIS — E663 Overweight: Secondary | ICD-10-CM | POA: Diagnosis not present

## 2020-01-17 DIAGNOSIS — Z6828 Body mass index (BMI) 28.0-28.9, adult: Secondary | ICD-10-CM | POA: Diagnosis not present

## 2020-01-17 DIAGNOSIS — M65341 Trigger finger, right ring finger: Secondary | ICD-10-CM | POA: Diagnosis not present

## 2020-01-17 DIAGNOSIS — M2550 Pain in unspecified joint: Secondary | ICD-10-CM | POA: Diagnosis not present

## 2020-01-17 DIAGNOSIS — M150 Primary generalized (osteo)arthritis: Secondary | ICD-10-CM | POA: Diagnosis not present

## 2020-02-04 ENCOUNTER — Other Ambulatory Visit: Payer: Self-pay | Admitting: Family Medicine

## 2020-02-04 DIAGNOSIS — J3089 Other allergic rhinitis: Secondary | ICD-10-CM

## 2020-02-20 DIAGNOSIS — M150 Primary generalized (osteo)arthritis: Secondary | ICD-10-CM | POA: Diagnosis not present

## 2020-02-20 DIAGNOSIS — E663 Overweight: Secondary | ICD-10-CM | POA: Diagnosis not present

## 2020-02-20 DIAGNOSIS — M810 Age-related osteoporosis without current pathological fracture: Secondary | ICD-10-CM | POA: Diagnosis not present

## 2020-02-20 DIAGNOSIS — Z79899 Other long term (current) drug therapy: Secondary | ICD-10-CM | POA: Diagnosis not present

## 2020-02-20 DIAGNOSIS — M353 Polymyalgia rheumatica: Secondary | ICD-10-CM | POA: Diagnosis not present

## 2020-02-20 DIAGNOSIS — M2550 Pain in unspecified joint: Secondary | ICD-10-CM | POA: Diagnosis not present

## 2020-02-20 DIAGNOSIS — Z6827 Body mass index (BMI) 27.0-27.9, adult: Secondary | ICD-10-CM | POA: Diagnosis not present

## 2020-02-20 DIAGNOSIS — M65341 Trigger finger, right ring finger: Secondary | ICD-10-CM | POA: Diagnosis not present

## 2020-03-12 ENCOUNTER — Telehealth: Payer: Self-pay

## 2020-03-12 NOTE — Telephone Encounter (Signed)
Patient called in to speak with the nurse about making an appointment to have some blood work done by Dr. Etter Sjogren. Per the patient he would like a call at [phone] as soon as possible.

## 2020-03-12 NOTE — Telephone Encounter (Signed)
Last routine OV was in 2019 w/ Dr. Etter Sjogren- needs an appt please. Labs can be completed at that time.

## 2020-03-16 ENCOUNTER — Other Ambulatory Visit: Payer: Self-pay

## 2020-03-16 ENCOUNTER — Encounter: Payer: Self-pay | Admitting: Family Medicine

## 2020-03-16 ENCOUNTER — Telehealth: Payer: Self-pay

## 2020-03-16 ENCOUNTER — Ambulatory Visit (INDEPENDENT_AMBULATORY_CARE_PROVIDER_SITE_OTHER): Payer: Medicare Other | Admitting: Family Medicine

## 2020-03-16 VITALS — BP 124/70 | HR 79 | Temp 97.7°F | Resp 18 | Ht 72.0 in | Wt 206.0 lb

## 2020-03-16 DIAGNOSIS — R7989 Other specified abnormal findings of blood chemistry: Secondary | ICD-10-CM

## 2020-03-16 DIAGNOSIS — R5383 Other fatigue: Secondary | ICD-10-CM | POA: Insufficient documentation

## 2020-03-16 DIAGNOSIS — E559 Vitamin D deficiency, unspecified: Secondary | ICD-10-CM | POA: Diagnosis not present

## 2020-03-16 DIAGNOSIS — Z23 Encounter for immunization: Secondary | ICD-10-CM

## 2020-03-16 DIAGNOSIS — E785 Hyperlipidemia, unspecified: Secondary | ICD-10-CM | POA: Diagnosis not present

## 2020-03-16 DIAGNOSIS — M353 Polymyalgia rheumatica: Secondary | ICD-10-CM | POA: Diagnosis not present

## 2020-03-16 DIAGNOSIS — E1151 Type 2 diabetes mellitus with diabetic peripheral angiopathy without gangrene: Secondary | ICD-10-CM

## 2020-03-16 DIAGNOSIS — I10 Essential (primary) hypertension: Secondary | ICD-10-CM | POA: Diagnosis not present

## 2020-03-16 DIAGNOSIS — E1165 Type 2 diabetes mellitus with hyperglycemia: Secondary | ICD-10-CM | POA: Insufficient documentation

## 2020-03-16 DIAGNOSIS — Z8546 Personal history of malignant neoplasm of prostate: Secondary | ICD-10-CM

## 2020-03-16 LAB — CBC WITH DIFFERENTIAL/PLATELET
Basophils Absolute: 0 K/uL (ref 0.0–0.1)
Basophils Relative: 0.2 % (ref 0.0–3.0)
Eosinophils Absolute: 0 K/uL (ref 0.0–0.7)
Eosinophils Relative: 0.3 % (ref 0.0–5.0)
HCT: 33.5 % — ABNORMAL LOW (ref 39.0–52.0)
Hemoglobin: 11.4 g/dL — ABNORMAL LOW (ref 13.0–17.0)
Lymphocytes Relative: 43.1 % (ref 12.0–46.0)
Lymphs Abs: 1.6 K/uL (ref 0.7–4.0)
MCHC: 34 g/dL (ref 30.0–36.0)
MCV: 107.2 fl — ABNORMAL HIGH (ref 78.0–100.0)
Monocytes Absolute: 1.4 K/uL — ABNORMAL HIGH (ref 0.1–1.0)
Monocytes Relative: 38 % — ABNORMAL HIGH (ref 3.0–12.0)
Neutro Abs: 0.7 K/uL — ABNORMAL LOW (ref 1.4–7.7)
Neutrophils Relative %: 18.4 % — ABNORMAL LOW (ref 43.0–77.0)
Platelets: 82 K/uL — ABNORMAL LOW (ref 150.0–400.0)
RBC: 3.13 Mil/uL — ABNORMAL LOW (ref 4.22–5.81)
RDW: 15.6 % — ABNORMAL HIGH (ref 11.5–15.5)
WBC: 3.8 K/uL — ABNORMAL LOW (ref 4.0–10.5)

## 2020-03-16 LAB — VITAMIN D 25 HYDROXY (VIT D DEFICIENCY, FRACTURES): VITD: 53.67 ng/mL (ref 30.00–100.00)

## 2020-03-16 LAB — COMPREHENSIVE METABOLIC PANEL WITH GFR
ALT: 13 U/L (ref 0–53)
AST: 16 U/L (ref 0–37)
Albumin: 4.1 g/dL (ref 3.5–5.2)
Alkaline Phosphatase: 64 U/L (ref 39–117)
BUN: 19 mg/dL (ref 6–23)
CO2: 30 meq/L (ref 19–32)
Calcium: 9.6 mg/dL (ref 8.4–10.5)
Chloride: 103 meq/L (ref 96–112)
Creatinine, Ser: 0.95 mg/dL (ref 0.40–1.50)
GFR: 75.15 mL/min (ref >60.00)
Glucose, Bld: 97 mg/dL (ref 70–99)
Potassium: 4.1 meq/L (ref 3.5–5.1)
Sodium: 141 meq/L (ref 135–145)
Total Bilirubin: 0.6 mg/dL (ref 0.2–1.2)
Total Protein: 6.1 g/dL (ref 6.0–8.3)

## 2020-03-16 LAB — HEMOGLOBIN A1C: Hgb A1c MFr Bld: 5.8 % (ref 4.6–6.5)

## 2020-03-16 LAB — LIPID PANEL
Cholesterol: 109 mg/dL (ref 0–200)
HDL: 54.9 mg/dL (ref >39.00)
LDL Cholesterol: 40 mg/dL (ref 0–99)
NonHDL: 53.82
Total CHOL/HDL Ratio: 2
Triglycerides: 68 mg/dL (ref 0.0–149.0)
VLDL: 13.6 mg/dL (ref 0.0–40.0)

## 2020-03-16 LAB — TSH: TSH: 1.25 u[IU]/mL (ref 0.35–4.50)

## 2020-03-16 LAB — PSA: PSA: 0.05 ng/mL — ABNORMAL LOW (ref 0.10–4.00)

## 2020-03-16 NOTE — Patient Instructions (Signed)

## 2020-03-16 NOTE — Assessment & Plan Note (Signed)
hgba1c to be checked, minimize simple carbs. Increase exercise as tolerated. Continue current meds  

## 2020-03-16 NOTE — Assessment & Plan Note (Signed)
.  per rheum

## 2020-03-16 NOTE — Assessment & Plan Note (Signed)
Encouraged heart healthy diet, increase exercise, avoid trans fats, consider a krill oil cap daily 

## 2020-03-16 NOTE — Progress Notes (Signed)
Patient ID: Andrew Ramos, male    DOB: [birth year]  Age: 86 y.o. MRN: [id number]    Subjective:  Subjective  HPI Andrew Ramos presents for f/u dm, chol and bp but also c/o extreme fatigue.  He had labs done at the [place] but forgot to bring them.   No other complaints  HPI HYPERTENSION   Blood pressure range-not checking   Chest pain- no      Dyspnea- no Lightheadedness- no   Edema- no  Other side effects - no   Medication compliance: good Low salt diet- yes    DIABETES    Blood Sugar ranges-not checking   Polyuria- no New Visual problems- no  Hypoglycemic symptoms- no  Other side effects-no Medication compliance - good Last eye exam- due  Foot exam- today   HYPERLIPIDEMIA  Medication compliance- good RUQ pain- no  Muscle aches- no Other side effects-no     Review of Systems  Constitutional: Negative.   HENT: Negative for congestion, ear pain, hearing loss, nosebleeds, postnasal drip, rhinorrhea, sinus pressure, sneezing and tinnitus.   Eyes: Negative for photophobia, discharge, itching and visual disturbance.  Respiratory: Negative.   Cardiovascular: Negative.   Gastrointestinal: Negative for abdominal distention, abdominal pain, anal bleeding, blood in stool and constipation.  Endocrine: Negative.   Genitourinary: Negative.   Musculoskeletal: Negative.   Skin: Negative.   Allergic/Immunologic: Negative.   Neurological: Negative for dizziness, weakness, light-headedness, numbness and headaches.  Psychiatric/Behavioral: Negative for agitation, confusion, decreased concentration, dysphoric mood, sleep disturbance and suicidal ideas. The patient is not nervous/anxious.     History Past Medical History:  Diagnosis Date  . Colon polyps    Tubular Adenoma 2005  . Diverticulosis   . Hepatitis    pt told by [facility] has hepatitis antibodies in blood   . Hyperlipidemia   . Hypertension   . Melanoma ([employer])   . Nonmelanoma skin cancer   .  Osteoarthritis    right hip- none since hip replacement  . Polymyalgia rheumatica ([place])   . Polyposis of colon   . Prostate cancer ([place]) 2007   s/p radiation seed implants  . Prostate cancer ([place])   . Thyroid disease sees dr altzhimer for yearly   nodules    He has a past surgical history that includes Total knee arthroplasty (12/31/2008); Total hip arthroplasty (08/27/2008); Tonsillectomy (as child); Total hip arthroplasty (06/12/2012); Polypectomy; and Cataract extraction.   His family history includes Coronary artery disease (age of onset: 76) in his mother; Coronary artery disease (age of onset: 82) in his father; Heart disease (age of onset: 4) in his mother; Pancreatic cancer (age of onset: 48) in his brother; Pancreatic cancer (age of onset: 64) in his father.He reports that he has never smoked. He has never used smokeless tobacco. He reports current alcohol use. He reports that he does not use drugs.  Current Outpatient Medications on File Prior to Visit  Medication Sig Dispense Refill  . aspirin 81 MG tablet Take 1 tablet (81 mg total) by mouth daily. 30 tablet   . atorvastatin (LIPITOR) 40 MG tablet TAKE 1 TABLET DAILY 90 tablet 1  . Calcium Carbonate-Vitamin D (CALCIUM 600 + D PO) Take 1 tablet by mouth every morning.     . Cholecalciferol (VITAMIN D3) 1000 UNITS tablet Take 1,000 Units by mouth every morning.     . donepezil (ARICEPT) 10 MG tablet Take 1 tablet (10 mg total) by mouth at  bedtime. 90 tablet 3  . fluticasone (FLONASE) 50 MCG/ACT nasal spray Place 2 sprays into both nostrils daily. 16 g 6  . hydroxychloroquine (PLAQUENIL) 200 MG tablet Take 200 mg by mouth 2 (two) times daily.    Marland Kitchen lisinopril-hydrochlorothiazide (ZESTORETIC) 10-12.5 MG tablet TAKE 1 TABLET DAILY 90 tablet 3  . loratadine (CLARITIN) 10 MG tablet TAKE 1 TABLET DAILY 90 tablet 3  . Multiple Vitamins-Minerals (CENTRUM SILVER ULTRA MENS) TABS Take 1 tablet by mouth every evening.     . polyethylene glycol  powder (GLYCOLAX/MIRALAX) powder Take 17 g by mouth daily as needed for moderate constipation. 1 scoop daily     . terazosin (HYTRIN) 1 MG capsule TAKE 1 CAPSULE AT BEDTIME 90 capsule 3  . venlafaxine XR (EFFEXOR-XR) 75 MG 24 hr capsule TAKE 1 CAPSULE DAILY WITH BREAKFAST 90 capsule 1   No current facility-administered medications on file prior to visit.     Objective:  Objective  Physical Exam Vitals and nursing note reviewed.  Constitutional:      General: He is sleeping.     Appearance: He is well-developed.  HENT:     Head: Normocephalic and atraumatic.  Eyes:     Pupils: Pupils are equal, round, and reactive to light.  Neck:     Thyroid: No thyromegaly.  Cardiovascular:     Rate and Rhythm: Normal rate and regular rhythm.     Heart sounds: No murmur heard.   Pulmonary:     Effort: Pulmonary effort is normal. No respiratory distress.     Breath sounds: Normal breath sounds. No wheezing or rales.  Chest:     Chest wall: No tenderness.  Musculoskeletal:        General: No tenderness.     Cervical back: Normal range of motion and neck supple.  Skin:    General: Skin is warm and dry.  Neurological:     Mental Status: He is oriented to person, place, and time.  Psychiatric:        Behavior: Behavior normal.        Thought Content: Thought content normal.        Judgment: Judgment normal.    BP 124/70 (BP Location: Right Arm, Patient Position: Sitting, Cuff Size: Normal)   Pulse 79   Temp 97.7 F (36.5 C) (Temporal)   Resp 18   Ht 6' (1.829 m)   Wt 206 lb (93.4 kg)   SpO2 95%   BMI 27.94 kg/m  Wt Readings from Last 3 Encounters:  03/16/20 206 lb (93.4 kg)  10/08/19 220 lb (99.8 kg)  02/28/19 222 lb (100.7 kg)     Lab Results  Component Value Date   WBC 3.6 (L) 10/01/2018   HGB 12.4 (L) 10/01/2018   HCT 38.0 (L) 10/01/2018   PLT 149 (L) 10/01/2018   GLUCOSE 94 07/02/2018   CHOL 108 04/16/2018   TRIG 66.0 04/16/2018   HDL 51.10 04/16/2018   LDLCALC 44  04/16/2018   ALT 35 07/02/2018   AST 32 07/02/2018   NA 141 07/02/2018   K 4.4 07/02/2018   CL 105 07/02/2018   CREATININE 0.95 07/02/2018   BUN 20 07/02/2018   CO2 27 07/02/2018   TSH 1.34 07/18/2016   PSA 0.04 (L) 04/16/2018   INR 1.06 09/29/2016   HGBA1C 6.0 04/16/2018   MICROALBUR <0.7 04/06/2015    DG BONE DENSITY (DXA)  Result Date: 07/26/2019 EXAM: DUAL X-RAY ABSORPTIOMETRY (DXA) FOR BONE MINERAL DENSITY IMPRESSION: MICHELLE G YOUNG  Your patient Andrew Ramos completed a BMD test on 07/26/2019 using the [place] (analysis version: 16.SP2) manufactured by [employer]. The following summarizes the results of our evaluation. [facility] PATIENT: Name: Andrew Ramos, Andrew Ramos Patient ID: [id number] Birth Date: 1932-07-04 Height: 71.5 in. Gender: Male Measured: 07/26/2019 Weight: 215.4 lbs. Indications: Advanced Age, Caucasian, History of Osteoporosis, Rheumatoid Arthritis Fractures: Treatments: Calcium, Fosamax(Alendronate), Multivitamin, Vitamin D ASSESSMENT: The BMD measured at Forearm Radius 33% is 0.894 g/cm2 with a T-score of 0.2. This patient is considered normal according to [facility]) criteria. I did not scan the hips because of bilateral hip replacements. The scan quality is good. Site Region Measured Date Measured Age WHO YA BMD Classification T-score AP Spine L1-L4 07/26/2019 85.4 Normal 3.1 1.553 g/cm2 Left Forearm Radius 33% 07/26/2019 85.4 Normal 0.2 0.894 g/cm2 World Health Organization [facility]) criteria for post-menopausal, Caucasian Women: Normal       T-score at or above -1 SD Osteopenia   T-score between -1 and -2.5 SD Osteoporosis T-score at or below -2.5 SD RECOMMENDATION:1. All patients should optimize calcium and vitamin D intake. 2. Consider FDA-approved medical therapies in postmenopausal women and men aged 32 years and older, based on the following: a. A hip or vertebral(clinical or morphometric) fracture. b. T-Score < -2.5 at the femoral neck or  spine after appropriate evaluation to exclude secondary causes c. Low bone mass (T-score between -1.0 and -2.5 at the femoral neck or spine) and a 10 year probability of a hip fracture >3% or a 10 year probability of major osteoporosis-related fracture > 20% based on the [place]-adapted WHO algorithm d. Clinical judgement and/or patient preferences may indicate treatment for people with 10-year fracture probabilities above or below these levels FOLLOW-UP: Patients with diagnosis of osteoporosis or at high risk for fracture should have regular bone mineral density tests. For patients eligible for Medicare, routine testing is allowed once every 2 years. The testing frequency can be increased to one year for patients who have rapidly progressing disease, those who are receiving or discontinuing medical therapy to restore bone mass, or have additional risk factors. I have reviewed this report and agree with the above findings. [facility] Radiology Electronically Signed   By: Lowella Grip III M.D.   On: 07/26/2019 11:01     Assessment & Plan:  Plan  I am having Andrew C. Sadler "Mikki Santee" maintain his Centrum Silver Ultra Mens, cholecalciferol, Calcium Carbonate-Vitamin D (CALCIUM 600 + D PO), aspirin, polyethylene glycol powder, fluticasone, terazosin, donepezil, venlafaxine XR, atorvastatin, loratadine, lisinopril-hydrochlorothiazide, and hydroxychloroquine.  No orders of the defined types were placed in this encounter.   Problem List Items Addressed This Visit      Unprioritized   DM (diabetes mellitus) type II, controlled, with peripheral vascular disorder ([place])    hgba1c to be checked , minimize simple carbs. Increase exercise as tolerated. Continue current meds      Essential hypertension    Well controlled, no changes to meds. Encouraged heart healthy diet such as the DASH diet and exercise as tolerated.       Relevant Orders   Lipid panel   Comprehensive metabolic panel   Fatigue - Primary     Check labs        Relevant Orders   CBC with Differential/Platelet   TSH   Comprehensive metabolic panel   Vitamin D (25 hydroxy)   Testos,Total,Free and SHBG (Male)   Hyperlipidemia    Encouraged heart healthy diet, increase exercise, avoid trans fats,  consider a krill oil cap daily      Relevant Orders   Lipid panel   Comprehensive metabolic panel   PMR (polymyalgia rheumatica) (HCC)    .per rheum       Relevant Orders   CBC with Differential/Platelet   TSH   Lipid panel   Comprehensive metabolic panel   Vitamin D (25 hydroxy)   Testos,Total,Free and SHBG (Male)   PSA   Uncontrolled type 2 diabetes mellitus with hyperglycemia ([place])   Relevant Orders   Hemoglobin A1c   Vitamin D deficiency    Check labs today      Relevant Orders   Vitamin D (25 hydroxy)    Other Visit Diagnoses    History of prostate cancer       Relevant Orders   PSA   Need for pneumococcal vaccination       Relevant Orders   Pneumococcal polysaccharide vaccine 23-valent greater than or equal to 2yo subcutaneous/IM (Completed)      Follow-up: Return in about 6 months (around 09/15/2020), or if symptoms worsen or fail to improve, for hypertension, hyperlipidemia, diabetes II.  Ann Held, DO

## 2020-03-16 NOTE — Telephone Encounter (Signed)
Patient came in to drop off a form for Dr. Etter Sjogren to complete. On 03/16/2020 at 12:15 pm

## 2020-03-16 NOTE — Assessment & Plan Note (Signed)
Well controlled, no changes to meds. Encouraged heart healthy diet such as the DASH diet and exercise as tolerated.  °

## 2020-03-16 NOTE — Assessment & Plan Note (Signed)
Check labs 

## 2020-03-16 NOTE — Assessment & Plan Note (Signed)
Check labs today.

## 2020-03-17 NOTE — Telephone Encounter (Signed)
Received. Given to [facility] for review

## 2020-03-20 LAB — TESTOS,TOTAL,FREE AND SHBG (FEMALE)
Free Testosterone: 27.3 pg/mL — ABNORMAL LOW (ref 30.0–135.0)
Sex Hormone Binding: 102 nmol/L — ABNORMAL HIGH (ref 22–77)
Testosterone, Total, LC-MS-MS: 420 ng/dL (ref 250–1100)

## 2020-03-20 NOTE — Addendum Note (Signed)
Addended byDamita Dunnings D on: 03/20/2020 03:15 PM   Modules accepted: Orders

## 2020-04-06 DIAGNOSIS — M353 Polymyalgia rheumatica: Secondary | ICD-10-CM | POA: Diagnosis not present

## 2020-04-12 ENCOUNTER — Other Ambulatory Visit: Payer: Self-pay | Admitting: Family Medicine

## 2020-04-12 DIAGNOSIS — F32A Depression, unspecified: Secondary | ICD-10-CM

## 2020-04-15 ENCOUNTER — Telehealth: Payer: Self-pay

## 2020-04-15 DIAGNOSIS — D649 Anemia, unspecified: Secondary | ICD-10-CM

## 2020-04-15 DIAGNOSIS — D619 Aplastic anemia, unspecified: Secondary | ICD-10-CM

## 2020-04-15 NOTE — Telephone Encounter (Signed)
Called patient regarding lab work from 04/06/2020 that was faxed to [place] from [facility]. Per PCP patient is anemic. He denies any blood in his stool. I have mailed him ifob kit in the mail Advised him to bring back specimen as soon as he can. Once those results are in we will schedule OV with [place] to discuss. Patient verbalized understanding.

## 2020-04-28 DIAGNOSIS — M353 Polymyalgia rheumatica: Secondary | ICD-10-CM | POA: Diagnosis not present

## 2020-04-28 DIAGNOSIS — M2550 Pain in unspecified joint: Secondary | ICD-10-CM | POA: Diagnosis not present

## 2020-04-28 DIAGNOSIS — E663 Overweight: Secondary | ICD-10-CM | POA: Diagnosis not present

## 2020-04-28 DIAGNOSIS — M810 Age-related osteoporosis without current pathological fracture: Secondary | ICD-10-CM | POA: Diagnosis not present

## 2020-04-28 DIAGNOSIS — M150 Primary generalized (osteo)arthritis: Secondary | ICD-10-CM | POA: Diagnosis not present

## 2020-04-28 DIAGNOSIS — M7062 Trochanteric bursitis, left hip: Secondary | ICD-10-CM | POA: Diagnosis not present

## 2020-04-28 DIAGNOSIS — S72112D Displaced fracture of greater trochanter of left femur, subsequent encounter for closed fracture with routine healing: Secondary | ICD-10-CM | POA: Diagnosis not present

## 2020-04-28 DIAGNOSIS — Z6826 Body mass index (BMI) 26.0-26.9, adult: Secondary | ICD-10-CM | POA: Diagnosis not present

## 2020-04-28 DIAGNOSIS — M25552 Pain in left hip: Secondary | ICD-10-CM | POA: Diagnosis not present

## 2020-04-28 DIAGNOSIS — Z79899 Other long term (current) drug therapy: Secondary | ICD-10-CM | POA: Diagnosis not present

## 2020-04-28 DIAGNOSIS — M25511 Pain in right shoulder: Secondary | ICD-10-CM | POA: Diagnosis not present

## 2020-04-28 DIAGNOSIS — M65341 Trigger finger, right ring finger: Secondary | ICD-10-CM | POA: Diagnosis not present

## 2020-04-28 DIAGNOSIS — M0609 Rheumatoid arthritis without rheumatoid factor, multiple sites: Secondary | ICD-10-CM | POA: Diagnosis not present

## 2020-05-11 ENCOUNTER — Ambulatory Visit: Payer: Medicare Other | Admitting: Neurology

## 2020-05-12 ENCOUNTER — Other Ambulatory Visit: Payer: Self-pay

## 2020-05-12 ENCOUNTER — Ambulatory Visit: Payer: Medicare Other | Attending: Orthopedic Surgery | Admitting: Physical Therapy

## 2020-05-12 ENCOUNTER — Encounter: Payer: Self-pay | Admitting: Physical Therapy

## 2020-05-12 DIAGNOSIS — Z9181 History of falling: Secondary | ICD-10-CM | POA: Diagnosis not present

## 2020-05-12 DIAGNOSIS — M6281 Muscle weakness (generalized): Secondary | ICD-10-CM | POA: Diagnosis not present

## 2020-05-12 DIAGNOSIS — M25652 Stiffness of left hip, not elsewhere classified: Secondary | ICD-10-CM

## 2020-05-12 DIAGNOSIS — R2689 Other abnormalities of gait and mobility: Secondary | ICD-10-CM | POA: Diagnosis not present

## 2020-05-12 DIAGNOSIS — M25552 Pain in left hip: Secondary | ICD-10-CM | POA: Diagnosis not present

## 2020-05-12 NOTE — Therapy (Signed)
[facility] [address]  [place], [place], [address] Phone: [phone]   Fax:  [phone]  Physical Therapy Evaluation  Patient Details  Name: Andrew Ramos MRN: [id number] Date of Birth: May 04, 1932 Referring Provider (PT): Rod Can, MD   Encounter Date: 05/12/2020   PT End of Session - 05/12/20 1528    Visit Number 1    Number of Visits 13    Date for PT Re-Evaluation 06/23/20    Authorization Type Medicare & Tricare    PT Start Time 1638    PT Stop Time 1523    PT Time Calculation (min) 39 min    Activity Tolerance Patient tolerated treatment well;Patient limited by pain    Behavior During Therapy [facility] for tasks assessed/performed           Past Medical History:  Diagnosis Date   Colon polyps    Tubular Adenoma 2005   Diverticulosis    Hepatitis    pt told by [facility] has hepatitis antibodies in blood    Hyperlipidemia    Hypertension    Melanoma ([place])    Nonmelanoma skin cancer    Osteoarthritis    right hip- none since hip replacement   Polymyalgia rheumatica ([place])    Polyposis of colon    Prostate cancer ([place]) 2007   s/p radiation seed implants   Prostate cancer [facility])    Thyroid disease sees dr altzhimer for yearly   nodules    Past Surgical History:  Procedure Laterality Date   CATARACT EXTRACTION     POLYPECTOMY     TONSILLECTOMY  as child   TOTAL HIP ARTHROPLASTY  08/27/2008   left   TOTAL HIP ARTHROPLASTY  06/12/2012   Procedure: TOTAL HIP ARTHROPLASTY ANTERIOR APPROACH;  Surgeon: Mauri Pole, MD;  Location: [facility];  Service: Orthopedics;  Laterality: Right;   TOTAL KNEE ARTHROPLASTY  12/31/2008   left    There were no vitals filed for this visit.    Subjective Assessment - 05/12/20 1445    Subjective Patient reports L hip pain originally started in 2009 when he had a THA. In 2018 he fell out of bed, breaking off a piece of his prosthesis, with a  piece of it stabbing him in the hip musculature. Was hospitalized for this and had PT. Has had fluctuating pain since then. Recently in the past 3 weeks he has had difficulty walking even with a walker d/t pain. Has seen his rheumatologist and orthopedist and was given some meds which improved his pain over the last 4 days. Pain is located over the lateral hip with radiation into the groin. Denies N/T. Worse when lifting his knee up, laying on the L, and walking. But this has somewhat improved. Now walking with SPC but did not need to use it at PLOF.    Pertinent History thyroid disease, prostate CA with radiation 2007, polymyalgia rheumatica, melanoma, HTN, HLD, hepatitis, B THA, L TKA    Limitations Lifting;Standing;Walking;House hold activities    How long can you walk comfortably? 150 ft from mailbox and back    Diagnostic tests none recent    Patient Stated Goals get rid of pain    Currently in Pain? Yes    Pain Score 0-No pain    Pain Location Hip    Pain Orientation Left;Lateral    Pain Descriptors / Indicators Aching    Pain Type Acute pain;Chronic pain  [facility] PT Assessment - 05/12/20 1452      Assessment   Medical Diagnosis L hip trochanteric bursitis    Referring Provider (PT) Rod Can, MD    Onset Date/Surgical Date 04/21/20    Next MD Visit pt unsure    Prior Therapy yes- for L hip      Precautions   Precautions None      Balance Screen   Has the patient fallen in the past 6 months Yes    How many times? 3   fell 2 weeks ago, cutting his R forearm   Has the patient had a decrease in activity level because of a fear of falling?  No    Is the patient reluctant to leave their home because of a fear of falling?  No      Home Social worker Private residence    Living Arrangements Spouse/significant other    Available Help at Discharge Family    Type of [place] Access Level entry    [place] - single point;Walker - 2 wheels      Prior Function   Level of Independence Independent    Vocation Retired    Leisure Geographical information systems officer   Overall Cognitive Status Within Functional Limits for tasks assessed      Observation/Other Assessments   Observations 3 inch cut over R doral forearm      Sensation   Light Touch Appears Intact      Coordination   Gross Motor Movements are Fluid and Coordinated Yes      Posture/Postural Control   Posture/Postural Control Postural limitations    Postural Limitations Rounded Shoulders;Forward head;Increased thoracic kyphosis      ROM / Strength   AROM / PROM / Strength AROM;Strength      AROM   AROM Assessment Site Hip    Right/Left Hip Right;Left    Right Hip Flexion 103    Right Hip External Rotation  25    Right Hip Internal Rotation  45    Left Hip Flexion 60   discomfort   Left Hip External Rotation  20    Left Hip Internal Rotation  11      Strength   Strength Assessment Site Hip;Knee;Ankle    Right/Left Hip Right;Left    Right Hip Flexion 4/5    Right Hip Extension 4+/5    Right Hip External Rotation  4+/5    Right Hip Internal Rotation 4+/5    Right Hip ABduction 4+/5    Right Hip ADduction 4+/5    Left Hip Flexion 4/5    Left Hip Extension 4+/5    Left Hip External Rotation 3+/5    Left Hip Internal Rotation 4/5    Left Hip ABduction 4/5    Left Hip ADduction 4/5    Right/Left Knee Right;Left    Right Knee Flexion 4+/5    Right Knee Extension 4+/5    Left Knee Flexion 4/5    Left Knee Extension 4+/5    Right/Left Ankle Right;Left    Right Ankle Dorsiflexion 4+/5    Right Ankle Plantar Flexion 4+/5    Left Ankle Dorsiflexion 4+/5    Left Ankle Plantar Flexion 4+/5      Flexibility   Soft Tissue Assessment /Muscle Length yes    Quadriceps L hip flexor and quad moderately tight in mod thomas  ITB L ober's test positive but nonpainful      Palpation   Palpation comment TTP over L  greater trochanter and along length of ITB, TTP and soft tissue restriction in L piriformis and proximal glutes      Ambulation/Gait   Assistive device Straight cane    Gait Pattern Step-through pattern;Lateral hip instability;Right flexed knee in stance;Left flexed knee in stance;Trunk flexed    Ambulation Surface Level;Indoor    Gait velocity decreased                      Objective measurements completed on examination: See above findings.               PT Education - 05/12/20 1528    Education Details prognosis, POC, HEP    Person(s) Educated Patient    Methods Explanation;Demonstration;Tactile cues;Verbal cues;Handout    Comprehension Verbalized understanding;Need further instruction            PT Short Term Goals - 05/12/20 1829      PT SHORT TERM GOAL #1   Title Patient to be independent with initial HEP.    Time 3    Period Weeks    Status New    Target Date 06/02/20             PT Long Term Goals - 05/12/20 1829      PT LONG TERM GOAL #1   Title Patient to be independent with advanced HEP.    Time 6    Period Weeks    Status New    Target Date 06/23/20      PT LONG TERM GOAL #2   Title Patient to demonstrate L hip AROM WFL and without pain limiting.    Time 6    Period Weeks    Status New    Target Date 06/23/20      PT LONG TERM GOAL #3   Title Patient to demonstrate B LE strength >/=4+/5.    Time 6    Period Weeks    Status New    Target Date 06/23/20      PT LONG TERM GOAL #4   Title Patient to demonstrate symmetrical step length, knee/hip flexion, and upright trunk with ambulation with LRAD.    Time 6    Period Weeks    Status New    Target Date 06/23/20      PT LONG TERM GOAL #5   Title Patient to report 80% improvement in L hip pain.    Time 6    Period Weeks    Status New    Target Date 06/23/20                  Plan - 05/12/20 1528    Clinical Impression Statement Patient is an 86y/o M  presenting to OPPT with c/o acute on chronic L hip pain with recent episode starting 3 weeks ago. Pain is located over the lateral hip with radiation into the groin. Denies N/T. Worse with L hip flexion, laying on the L, and walking. Patient now ambulating with SPC d/t pain, but did not use an AD at PLOF. Patient today presented with rounded shoulders and kyphotic posture, limited L hip AROM, decreased L hip strength, L hip flexor and TFL tightness, TTP over L greater trochanter and along length of ITB, soft tissue restriction and pain in L piriformis and proximal glutes, and gait deviations. Patient was educated on gentle stretching and  strengthening HEP- patient reported understanding. Would benefit from skilled PT services 2x/week for 6 weeks to address aforementioned impairments.    Personal Factors and Comorbidities Age;Comorbidity 3+;Fitness;Past/Current Experience;Time since onset of injury/illness/exacerbation    Comorbidities thyroid disease, prostate CA with radiation 2007, polymyalgia rheumatica, melanoma, HTN, HLD, hepatitis, B THA, L TKA    Examination-Activity Limitations Sleep;Bed Mobility;Squat;Bend;Stairs;Locomotion Level;Transfers;Dressing    Examination-Participation Restrictions Church;Cleaning;Shop;Driving    Stability/Clinical Decision Making Stable/Uncomplicated    Clinical Decision Making Low    Rehab Potential Good    PT Frequency 2x / week    PT Duration 6 weeks    PT Treatment/Interventions ADLs/Self Care Home Management;Cryotherapy;Electrical Stimulation;Iontophoresis 4mg /ml Dexamethasone;Moist Heat;Balance training;Therapeutic exercise;Therapeutic activities;Functional mobility training;Stair training;Gait training;Ultrasound;Neuromuscular re-education;Patient/family education;Manual techniques;Taping;Energy conservation;Dry needling;Passive range of motion;Scar mobilization    PT Next Visit Plan hip FOTO, reassess HEP, progress hip AROM and strengthening    Consulted and  Agree with Plan of Care Patient           Patient will benefit from skilled therapeutic intervention in order to improve the following deficits and impairments:  Hypomobility, Decreased activity tolerance, Decreased strength, Increased fascial restricitons, Pain, Difficulty walking, Increased muscle spasms, Improper body mechanics, Decreased range of motion, Postural dysfunction, Impaired flexibility  Visit Diagnosis: Pain in left hip  Stiffness of left hip, not elsewhere classified  Other abnormalities of gait and mobility  Muscle weakness (generalized)  History of falling     Problem List Patient Active Problem List   Diagnosis Date Noted   Uncontrolled type 2 diabetes mellitus with hyperglycemia ([place]) 03/16/2020   Fatigue 03/16/2020   Rib pain on left side 05/27/2017   Closed left hip fracture ([place]) 09/29/2016   Fall    Periprosthetic fracture around internal prosthetic left hip joint (HCC)    Osteoporosis    Benign prostatic hyperplasia without lower urinary tract symptoms    Depression    Mild cognitive impairment 09/23/2016   Nuclear sclerotic cataract of left eye 04/02/2016   Cortical age-related cataract of both eyes 01/04/2016   Degenerative retinal drusen of both eyes 01/04/2016   Nuclear sclerotic cataract of both eyes 01/04/2016   Open angle with borderline findings and low glaucoma risk in both eyes 01/04/2016   Posterior vitreous detachment of both eyes 01/04/2016   Maxillary sinusitis, acute 11/03/2015   DM (diabetes mellitus) type II, controlled, with peripheral vascular disorder ([place]) 04/06/2015   Acute upper respiratory infection 11/11/2014   Prostate cancer ([place])    Melanoma ([place])    Nonmelanoma skin cancer    Polyposis of colon    Influenza-like illness 09/30/2013   Obesity (BMI 30-39.9) 05/10/2013   Obese 06/14/2012   S/P right THA, AA 06/12/2012   PMR (polymyalgia rheumatica) ([place]) 05/10/2011   EXTERNAL  HEMORRHOIDS 06/09/2010   DIVERTICULOSIS, COLON 06/09/2010   COLONIC POLYPS, HX OF 06/09/2010   Vitamin D deficiency 05/18/2010   TOTAL KNEE REPLACEMENT, LEFT, HX OF 01/22/2009   Unspecified hearing loss 03/13/2008   Thyroid nodule 08/14/2007   PROSTATE CANCER, HX OF 06/18/2007   Hyperlipidemia 02/28/2007   Essential hypertension 02/28/2007   OSTEOARTHRITIS 02/28/2007     Janene Harvey, PT, DPT 05/12/20 6:34 PM   [facility] [address]  [address] [place], [place], [address] Phone: [phone]   Fax:  [phone]  Name: Andrew Ramos MRN: [id number] Date of Birth: 06/29/1932

## 2020-05-14 ENCOUNTER — Ambulatory Visit: Payer: Medicare Other

## 2020-05-14 ENCOUNTER — Other Ambulatory Visit: Payer: Self-pay

## 2020-05-14 VITALS — BP 156/70 | HR 115

## 2020-05-14 DIAGNOSIS — Z9181 History of falling: Secondary | ICD-10-CM | POA: Diagnosis not present

## 2020-05-14 DIAGNOSIS — M25652 Stiffness of left hip, not elsewhere classified: Secondary | ICD-10-CM

## 2020-05-14 DIAGNOSIS — R2689 Other abnormalities of gait and mobility: Secondary | ICD-10-CM

## 2020-05-14 DIAGNOSIS — M25552 Pain in left hip: Secondary | ICD-10-CM

## 2020-05-14 DIAGNOSIS — M6281 Muscle weakness (generalized): Secondary | ICD-10-CM | POA: Diagnosis not present

## 2020-05-14 NOTE — Therapy (Signed)
[facility] [address]  [place], [place], [address] Phone: [phone]   Fax:  [phone]  Physical Therapy Treatment  Patient Details  Name: Andrew Ramos MRN: [id number] Date of Birth: [birth year] Referring Provider (PT): Rod Can, MD   Encounter Date: 05/14/2020   PT End of Session - 05/14/20 1416    Visit Number 2    Number of Visits 13    Date for PT Re-Evaluation 06/23/20    Authorization Type Medicare & Tricare    PT Start Time 1401    PT Stop Time 1445    PT Time Calculation (min) 44 min    Activity Tolerance Patient tolerated treatment well    Behavior During Therapy [facility] for tasks assessed/performed           Past Medical History:  Diagnosis Date  . Colon polyps    Tubular Adenoma 2005  . Diverticulosis   . Hepatitis    pt told by red cross has hepatitis antibodies in blood   . Hyperlipidemia   . Hypertension   . Melanoma ([place])   . Nonmelanoma skin cancer   . Osteoarthritis    right hip- none since hip replacement  . Polymyalgia rheumatica ([place])   . Polyposis of colon   . Prostate cancer ([place]) 2007   s/p radiation seed implants  . Prostate cancer ([place])   . Thyroid disease sees dr altzhimer for yearly   nodules    Past Surgical History:  Procedure Laterality Date  . CATARACT EXTRACTION    . POLYPECTOMY    . TONSILLECTOMY  as child  . TOTAL HIP ARTHROPLASTY  08/27/2008   left  . TOTAL HIP ARTHROPLASTY  06/12/2012   Procedure: TOTAL HIP ARTHROPLASTY ANTERIOR APPROACH;  Surgeon: Mauri Pole, MD;  Location: [facility];  Service: Orthopedics;  Laterality: Right;  . TOTAL KNEE ARTHROPLASTY  12/31/2008   left    Vitals:   05/14/20 1415  BP: (!) 156/70  Pulse: (!) 115  SpO2: 96%     Subjective Assessment - 05/14/20 1415    Subjective Doing ok.    Pertinent History thyroid disease, prostate CA with radiation 2007, polymyalgia rheumatica, melanoma, HTN, HLD, hepatitis, B THA,  L TKA    Diagnostic tests none recent    Patient Stated Goals get rid of pain    Currently in Pain? No/denies    Pain Score 0-No pain    Pain Location Hip    Pain Orientation Left;Lateral    Pain Descriptors / Indicators Aching                             [facility] Adult PT Treatment/Exercise - 05/14/20 0001      Self-Care   Self-Care Other Self-Care Comments    Other Self-Care Comments  monitoring pt. elevated HR (115bpm post-supine exercises), elevated BP (156/49mmHg once sitting); discussion with pt. regarding his elevated vitals and what is normal readings for him      Knee/Hip Exercises: Stretches   Passive Hamstring Stretch Left;1 rep;30 seconds    Passive Hamstring Stretch Limitations supine with strap     Hip Flexor Stretch Right;Left;1 rep;30 seconds    Hip Flexor Stretch Limitations seated and standing     ITB Stretch Left;2 reps;30 seconds    ITB Stretch Limitations supine with straps       Knee/Hip Exercises: Standing   Hip Flexion --  attempted to stand for this exercise - terminated 2nd to >HR     Knee/Hip Exercises: Supine   Bridges Both;10 reps    Bridges Limitations cues for proper pacing     Other Supine Knee/Hip Exercises Hooklying alternating clam shell with red looped TB at knees x 10 reps       Knee/Hip Exercises: Sidelying   Clams L clam shell x 10 reps                   PT Education - 05/14/20 1455    Education Details HEP update. seated hip flexor stretch    Person(s) Educated Patient    Methods Explanation;Demonstration;Verbal cues;Handout    Comprehension Verbalized understanding;Returned demonstration;Verbal cues required            PT Short Term Goals - 05/14/20 1417      PT SHORT TERM GOAL #1   Title Patient to be independent with initial HEP.    Time 3    Period Weeks    Status On-going    Target Date 06/02/20             PT Long Term Goals - 05/14/20 1417      PT LONG TERM GOAL #1   Title Patient  to be independent with advanced HEP.    Time 6    Period Weeks    Status On-going      PT LONG TERM GOAL #2   Title Patient to demonstrate L hip AROM WFL and without pain limiting.    Time 6    Period Weeks    Status On-going      PT LONG TERM GOAL #3   Title Patient to demonstrate B LE strength >/=4+/5.    Time 6    Period Weeks    Status On-going      PT LONG TERM GOAL #4   Title Patient to demonstrate symmetrical step length, knee/hip flexion, and upright trunk with ambulation with LRAD.    Time 6    Period Weeks    Status On-going      PT LONG TERM GOAL #5   Title Patient to report 80% improvement in L hip pain.    Time 6    Period Weeks    Status On-going                 Plan - 05/14/20 1418    Clinical Impression Statement Andrew Ramos doing ok to start session.  Focused session on review of HEP to check for proper technique with cueing required to prevent trunk rotation with sidelying clam shell and for proper angle of pull for supine ITB stretch.  Upon standing for hip flexor march exercise pt. noting he felt, "woozy".  Upon sitting pt. BP elevated to 156/86mmHg, HR 115 bpm.  Pt. "Woozy" symptoms subsided after 1 min sitting rest break and drinking small bottle of water with some reduction in HR and blood pressure following along with no further incidence of "wooziness".  May consider testing for orthostatic hypotension along with further vitals monitoring in coming sessions.    Comorbidities thyroid disease, prostate CA with radiation 2007, polymyalgia rheumatica, melanoma, HTN, HLD, hepatitis, B THA, L TKA    Rehab Potential Good    PT Frequency 2x / week    PT Duration 6 weeks    PT Treatment/Interventions ADLs/Self Care Home Management;Cryotherapy;Electrical Stimulation;Iontophoresis 4mg /ml Dexamethasone;Moist Heat;Balance training;Therapeutic exercise;Therapeutic activities;Functional mobility training;Stair training;Gait training;Ultrasound;Neuromuscular  re-education;Patient/family education;Manual techniques;Taping;Energy conservation;Dry needling;Passive range  of motion;Scar mobilization    PT Next Visit Plan hip FOTO, progress hip AROM and strengthening    Consulted and Agree with Plan of Care Patient           Patient will benefit from skilled therapeutic intervention in order to improve the following deficits and impairments:  Hypomobility, Decreased activity tolerance, Decreased strength, Increased fascial restricitons, Pain, Difficulty walking, Increased muscle spasms, Improper body mechanics, Decreased range of motion, Postural dysfunction, Impaired flexibility  Visit Diagnosis: Pain in left hip  Stiffness of left hip, not elsewhere classified  Other abnormalities of gait and mobility  Muscle weakness (generalized)  History of falling     Problem List Patient Active Problem List   Diagnosis Date Noted  . Uncontrolled type 2 diabetes mellitus with hyperglycemia ([place]) 03/16/2020  . Fatigue 03/16/2020  . Rib pain on left side 05/27/2017  . Closed left hip fracture ([place]) 09/29/2016  . Fall   . Periprosthetic fracture around internal prosthetic left hip joint ([place])   . Osteoporosis   . Benign prostatic hyperplasia without lower urinary tract symptoms   . Depression   . Mild cognitive impairment 09/23/2016  . Nuclear sclerotic cataract of left eye 04/02/2016  . Cortical age-related cataract of both eyes 01/04/2016  . Degenerative retinal drusen of both eyes 01/04/2016  . Nuclear sclerotic cataract of both eyes 01/04/2016  . Open angle with borderline findings and low glaucoma risk in both eyes 01/04/2016  . Posterior vitreous detachment of both eyes 01/04/2016  . Maxillary sinusitis, acute 11/03/2015  . DM (diabetes mellitus) type II, controlled, with peripheral vascular disorder ([place]) 04/06/2015  . Acute upper respiratory infection 11/11/2014  . Prostate cancer ([place])   . Melanoma ([place])   . Nonmelanoma skin cancer    . Polyposis of colon   . Influenza-like illness 09/30/2013  . Obesity (BMI 30-39.9) 05/10/2013  . Obese 06/14/2012  . S/P right THA, AA 06/12/2012  . PMR (polymyalgia rheumatica) ([place]) 05/10/2011  . EXTERNAL HEMORRHOIDS 06/09/2010  . DIVERTICULOSIS, COLON 06/09/2010  . COLONIC POLYPS, HX OF 06/09/2010  . Vitamin D deficiency 05/18/2010  . TOTAL KNEE REPLACEMENT, LEFT, HX OF 01/22/2009  . Unspecified hearing loss 03/13/2008  . Thyroid nodule 08/14/2007  . PROSTATE CANCER, HX OF 06/18/2007  . Hyperlipidemia 02/28/2007  . Essential hypertension 02/28/2007  . OSTEOARTHRITIS 02/28/2007    Bess Harvest, PTA 05/14/20 3:15 PM   [facility] [address]  [place], [place], [address] Phone: [phone]   Fax:  [phone]  Name: Andrew Ramos MRN: [id number] Date of Birth: 07-28-1932

## 2020-05-19 ENCOUNTER — Ambulatory Visit: Payer: Medicare Other

## 2020-05-19 ENCOUNTER — Other Ambulatory Visit: Payer: Self-pay

## 2020-05-19 VITALS — BP 128/68 | HR 108

## 2020-05-19 DIAGNOSIS — M6281 Muscle weakness (generalized): Secondary | ICD-10-CM

## 2020-05-19 DIAGNOSIS — R2689 Other abnormalities of gait and mobility: Secondary | ICD-10-CM

## 2020-05-19 DIAGNOSIS — M25552 Pain in left hip: Secondary | ICD-10-CM

## 2020-05-19 DIAGNOSIS — Z9181 History of falling: Secondary | ICD-10-CM | POA: Diagnosis not present

## 2020-05-19 DIAGNOSIS — M25652 Stiffness of left hip, not elsewhere classified: Secondary | ICD-10-CM

## 2020-05-19 NOTE — Therapy (Signed)
[facility] [address]  [place], [place], [address] Phone: [phone]   Fax:  [phone]  Physical Therapy Treatment  Patient Details  Name: Andrew Ramos MRN: [id number] Date of Birth: 1931/09/28 Referring Provider (PT): Rod Can, MD   Encounter Date: 05/19/2020   PT End of Session - 05/19/20 1417    Visit Number 3    Number of Visits 13    Date for PT Re-Evaluation 06/23/20    Authorization Type Medicare & Tricare    PT Start Time 1400    PT Stop Time 1445    PT Time Calculation (min) 45 min    Activity Tolerance Patient tolerated treatment well    Behavior During Therapy [facility] for tasks assessed/performed           Past Medical History:  Diagnosis Date  . Colon polyps    Tubular Adenoma 2005  . Diverticulosis   . Hepatitis    pt told by red cross has hepatitis antibodies in blood   . Hyperlipidemia   . Hypertension   . Melanoma ([place])   . Nonmelanoma skin cancer   . Osteoarthritis    right hip- none since hip replacement  . Polymyalgia rheumatica ([place])   . Polyposis of colon   . Prostate cancer ([place]) 2007   s/p radiation seed implants  . Prostate cancer ([place])   . Thyroid disease sees dr altzhimer for yearly   nodules    Past Surgical History:  Procedure Laterality Date  . CATARACT EXTRACTION    . POLYPECTOMY    . TONSILLECTOMY  as child  . TOTAL HIP ARTHROPLASTY  08/27/2008   left  . TOTAL HIP ARTHROPLASTY  06/12/2012   Procedure: TOTAL HIP ARTHROPLASTY ANTERIOR APPROACH;  Surgeon: Mauri Pole, MD;  Location: [facility];  Service: Orthopedics;  Laterality: Right;  . TOTAL KNEE ARTHROPLASTY  12/31/2008   left   Testing for Orthostatic Hypotension: Taken       After 5 min supine                       After 1 min sitting   After 1 min standing   Vitals:   05/19/20 1410 05/19/20 1422 05/19/20 1424  BP: 138/68 138/72 128/68  Pulse: 92 (!) 105 (!) 108  SpO2: 96% 96% 96%      Subjective Assessment - 05/19/20 1410    Subjective Pt. reporting some dizziness today.    Pertinent History thyroid disease, prostate CA with radiation 2007, polymyalgia rheumatica, melanoma, HTN, HLD, hepatitis, B THA, L TKA    Diagnostic tests none recent    Patient Stated Goals get rid of pain    Currently in Pain? No/denies    Pain Score 0-No pain    Pain Location Hip    Pain Orientation Left;Lateral    Pain Descriptors / Indicators Aching    Pain Type Acute pain;Chronic pain    Multiple Pain Sites No              [facility] PT Assessment - 05/19/20 0001      Observation/Other Assessments   Focus on Therapeutic Outcomes (FOTO)  47% (53% limitation)                         [facility] Adult PT Treatment/Exercise - 05/19/20 0001      Self-Care   Self-Care Other Self-Care Comments    Other  Self-Care Comments  testing for orthostatic hypotension;  see pt. vitals; pt. asymptomatic/negative for orthostatic hypotension       Knee/Hip Exercises: Stretches   Passive Hamstring Stretch Left;30 seconds;3 reps    Passive Hamstring Stretch Limitations supine with strap     ITB Stretch Left;2 reps;30 seconds    ITB Stretch Limitations supine with straps     Piriformis Stretch Left;2 reps;30 seconds    Piriformis Stretch Limitations Figure-4 stretch       Knee/Hip Exercises: Supine   Bridges Both;10 reps    Bridges Limitations 5" hold     Other Supine Knee/Hip Exercises Hooklying alternating clam shell with red looped TB at knees x 12     Other Supine Knee/Hip Exercises Hooklying alternating LE march into yellow looped TB x 10 reps      Knee/Hip Exercises: Sidelying   Clams L clam shell x 12 reps                     PT Short Term Goals - 05/14/20 1417      PT SHORT TERM GOAL #1   Title Patient to be independent with initial HEP.    Time 3    Period Weeks    Status On-going    Target Date 06/02/20             PT Long Term Goals - 05/14/20 1417       PT LONG TERM GOAL #1   Title Patient to be independent with advanced HEP.    Time 6    Period Weeks    Status On-going      PT LONG TERM GOAL #2   Title Patient to demonstrate L hip AROM WFL and without pain limiting.    Time 6    Period Weeks    Status On-going      PT LONG TERM GOAL #3   Title Patient to demonstrate B LE strength >/=4+/5.    Time 6    Period Weeks    Status On-going      PT LONG TERM GOAL #4   Title Patient to demonstrate symmetrical step length, knee/hip flexion, and upright trunk with ambulation with LRAD.    Time 6    Period Weeks    Status On-going      PT LONG TERM GOAL #5   Title Patient to report 80% improvement in L hip pain.    Time 6    Period Weeks    Status On-going                 Plan - 05/19/20 1503    Clinical Impression Statement Andrew Ramos reporting some dizziness earlier today.  Denied symptoms during session and asymptomatic during testing for orthostatic hypotension with noted increased in HR by 13 points going from supine>standing.  BP with no significant change during testing for Orthostatic hypotension.  Progressed hip strengthening and LE flexibility activities today without pain.  Pt. noting good improvement in hip pain since starting therapy.  Notes improved pain/no pain in hip over this past 5 days.    Comorbidities thyroid disease, prostate CA with radiation 2007, polymyalgia rheumatica, melanoma, HTN, HLD, hepatitis, B THA, L TKA    Rehab Potential Good    PT Frequency 2x / week    PT Duration 6 weeks    PT Treatment/Interventions ADLs/Self Care Home Management;Cryotherapy;Electrical Stimulation;Iontophoresis 4mg /ml Dexamethasone;Moist Heat;Balance training;Therapeutic exercise;Therapeutic activities;Functional mobility training;Stair training;Gait training;Ultrasound;Neuromuscular re-education;Patient/family education;Manual techniques;Taping;Energy conservation;Dry needling;Passive range  of motion;Scar mobilization    PT Next  Visit Plan Progress hip AROM and strengthening    Consulted and Agree with Plan of Care Patient           Patient will benefit from skilled therapeutic intervention in order to improve the following deficits and impairments:  Hypomobility, Decreased activity tolerance, Decreased strength, Increased fascial restricitons, Pain, Difficulty walking, Increased muscle spasms, Improper body mechanics, Decreased range of motion, Postural dysfunction, Impaired flexibility  Visit Diagnosis: Pain in left hip  Stiffness of left hip, not elsewhere classified  Other abnormalities of gait and mobility  Muscle weakness (generalized)  History of falling     Problem List Patient Active Problem List   Diagnosis Date Noted  . Uncontrolled type 2 diabetes mellitus with hyperglycemia ([place]) 03/16/2020  . Fatigue 03/16/2020  . Rib pain on left side 05/27/2017  . Closed left hip fracture ([place]) 09/29/2016  . Fall   . Periprosthetic fracture around internal prosthetic left hip joint ([place])   . Osteoporosis   . Benign prostatic hyperplasia without lower urinary tract symptoms   . Depression   . Mild cognitive impairment 09/23/2016  . Nuclear sclerotic cataract of left eye 04/02/2016  . Cortical age-related cataract of both eyes 01/04/2016  . Degenerative retinal drusen of both eyes 01/04/2016  . Nuclear sclerotic cataract of both eyes 01/04/2016  . Open angle with borderline findings and low glaucoma risk in both eyes 01/04/2016  . Posterior vitreous detachment of both eyes 01/04/2016  . Maxillary sinusitis, acute 11/03/2015  . DM (diabetes mellitus) type II, controlled, with peripheral vascular disorder ([place]) 04/06/2015  . Acute upper respiratory infection 11/11/2014  . Prostate cancer ([place])   . Melanoma ([place])   . Nonmelanoma skin cancer   . Polyposis of colon   . Influenza-like illness 09/30/2013  . Obesity (BMI 30-39.9) 05/10/2013  . Obese 06/14/2012  . S/P right THA, AA 06/12/2012  . PMR  (polymyalgia rheumatica) ([place]) 05/10/2011  . EXTERNAL HEMORRHOIDS 06/09/2010  . DIVERTICULOSIS, COLON 06/09/2010  . COLONIC POLYPS, HX OF 06/09/2010  . Vitamin D deficiency 05/18/2010  . TOTAL KNEE REPLACEMENT, LEFT, HX OF 01/22/2009  . Unspecified hearing loss 03/13/2008  . Thyroid nodule 08/14/2007  . PROSTATE CANCER, HX OF 06/18/2007  . Hyperlipidemia 02/28/2007  . Essential hypertension 02/28/2007  . OSTEOARTHRITIS 02/28/2007    Bess Harvest, PTA 05/19/20 3:24 PM   [facility] [address]  [place], [place], [address] Phone: [phone]   Fax:  [phone]  Name: Andrew Ramos MRN: [id number] Date of Birth: 10-01-1931

## 2020-05-21 ENCOUNTER — Other Ambulatory Visit: Payer: Self-pay

## 2020-05-21 ENCOUNTER — Ambulatory Visit: Payer: Medicare Other | Attending: Orthopedic Surgery

## 2020-05-21 VITALS — BP 144/70 | HR 108

## 2020-05-21 DIAGNOSIS — M6281 Muscle weakness (generalized): Secondary | ICD-10-CM | POA: Diagnosis not present

## 2020-05-21 DIAGNOSIS — M25652 Stiffness of left hip, not elsewhere classified: Secondary | ICD-10-CM | POA: Diagnosis not present

## 2020-05-21 DIAGNOSIS — R2689 Other abnormalities of gait and mobility: Secondary | ICD-10-CM | POA: Diagnosis not present

## 2020-05-21 DIAGNOSIS — Z9181 History of falling: Secondary | ICD-10-CM | POA: Insufficient documentation

## 2020-05-21 DIAGNOSIS — M25552 Pain in left hip: Secondary | ICD-10-CM | POA: Diagnosis not present

## 2020-05-21 NOTE — Therapy (Signed)
[facility] [address]  [address] [place], [place], [address] Phone: [phone]   Fax:  [phone]  Physical Therapy Treatment  Patient Details  Name: Andrew Ramos MRN: [id number] Date of Birth: [birth year] Referring Provider (PT): Samson Frederic, MD   Encounter Date: 05/21/2020   PT End of Session - 05/21/20 1424    Visit Number 4    Number of Visits 13    Date for PT Re-Evaluation 06/23/20    Authorization Type Medicare & Tricare    PT Start Time 1408   Pt. arrived late thus tx time limited   PT Stop Time 1444    PT Time Calculation (min) 36 min    Activity Tolerance Patient tolerated treatment well    Behavior During Therapy WFL for tasks assessed/performed           Past Medical History:  Diagnosis Date  . Colon polyps    Tubular Adenoma 2005  . Diverticulosis   . Hepatitis    pt told by [facility] cross has hepatitis antibodies in blood   . Hyperlipidemia   . Hypertension   . Melanoma (HCC)   . Nonmelanoma skin cancer   . Osteoarthritis    right hip- none since hip replacement  . Polymyalgia rheumatica (HCC)   . Polyposis of colon   . Prostate cancer (HCC) 2007   s/p radiation seed implants  . Prostate cancer (HCC)   . Thyroid disease sees dr altzhimer for yearly   nodules    Past Surgical History:  Procedure Laterality Date  . CATARACT EXTRACTION    . POLYPECTOMY    . TONSILLECTOMY  as child  . TOTAL HIP ARTHROPLASTY  08/27/2008   left  . TOTAL HIP ARTHROPLASTY  06/12/2012   Procedure: TOTAL HIP ARTHROPLASTY ANTERIOR APPROACH;  Surgeon: Shelda Pal, MD;  Location: [facility];  Service: Orthopedics;  Laterality: Right;  . TOTAL KNEE ARTHROPLASTY  12/31/2008   left    Vitals:   05/21/20 1424 05/21/20 1430  BP: (!) 144/70   Pulse: (!) 128 (!) 108  SpO2: 96% 96%     Subjective Assessment - 05/21/20 1424    Subjective Pt. denies dizziness today.    Pertinent History thyroid disease, prostate CA  with radiation 2007, polymyalgia rheumatica, melanoma, HTN, HLD, hepatitis, B THA, L TKA    Diagnostic tests none recent    Patient Stated Goals get rid of pain    Currently in Pain? No/denies    Pain Score 0-No pain    Pain Location Hip    Pain Orientation Left;Lateral    Pain Descriptors / Indicators Aching    Pain Type Acute pain;Chronic pain    Multiple Pain Sites No                             [facility] Adult PT Treatment/Exercise - 05/21/20 0001      Self-Care   Self-Care Other Self-Care Comments    Other Self-Care Comments  education on lack of hydration effect on total blood volume and BP changes in supine>sitting> stnading       Knee/Hip Exercises: Aerobic   Nustep Lvl 4, 6 min (UE/LE)   HR reading 130bpm after warmup     Knee/Hip Exercises: Standing   Heel Raises Both;15 reps    Heel Raises Limitations heel/toe raise     Hip Flexion Right;Left;10 reps;Knee bent;Stengthening   pt. lightheaded afterward  requiring sitting rest break   Hip Flexion Limitations toe clears to TM side bar (9")       Knee/Hip Exercises: Seated   Long Arc Quad Right;Left;15 reps;Strengthening    Long Arc Quad Limitations cues for [employer] to [employer] 2 sets   2 x 8 reps from chair + airex pads                  PT Education - 05/21/20 1546    Education Details HEP update    Person(s) Educated Patient    Methods Explanation;Demonstration;Verbal cues    Comprehension Verbalized understanding;Verbal cues required;Returned demonstration            PT Short Term Goals - 05/21/20 1425      PT SHORT TERM GOAL #1   Title Patient to be independent with initial HEP.    Time 3    Period Weeks    Status Achieved    Target Date 06/02/20             PT Long Term Goals - 05/14/20 1417      PT LONG TERM GOAL #1   Title Patient to be independent with advanced HEP.    Time 6    Period Weeks    Status On-going      PT LONG TERM GOAL #2   Title Patient to demonstrate  L hip AROM WFL and without pain limiting.    Time 6    Period Weeks    Status On-going      PT LONG TERM GOAL #3   Title Patient to demonstrate B LE strength >/=4+/5.    Time 6    Period Weeks    Status On-going      PT LONG TERM GOAL #4   Title Patient to demonstrate symmetrical step length, knee/hip flexion, and upright trunk with ambulation with LRAD.    Time 6    Period Weeks    Status On-going      PT LONG TERM GOAL #5   Title Patient to report 80% improvement in L hip pain.    Time 6    Period Weeks    Status On-going                 Plan - 05/21/20 1425    Clinical Impression Statement Pt. reporting he is getting benefit from initial HEP and performing daily.  STG #1 met. Notes he has not been having L hip pain for a few weeks now.   Andrew Ramos denies dizziness today.  Reports he has been walking with his [facility] for safety.  Arrived late to session thus treatment time limited. Limited session focused on activities to improve LE clearance and proximal hip strength as pt. with visible instability and poor LE clearance walking into clinic.  Did have to take short sitting rest break due to pt. reporting he felt lightheaded after standing toe-clears.  Pt. symptoms subsided with sitting rest break and small bottle of water.  Pt. did have elevated HR during session a few times taken in sitting though asymptomatic at those times.  Pt. encouraged to reach out to MD regarding elevated HR before next PT session.    Comorbidities thyroid disease, prostate CA with radiation 2007, polymyalgia rheumatica, melanoma, HTN, HLD, hepatitis, B THA, L TKA    Rehab Potential Good    PT Frequency 2x / week    PT Duration 6 weeks    PT Treatment/Interventions ADLs/Self  Care Home Management;Cryotherapy;Electrical Stimulation;Iontophoresis 27m/ml Dexamethasone;Moist Heat;Balance training;Therapeutic exercise;Therapeutic activities;Functional mobility training;Stair training;Gait  training;Ultrasound;Neuromuscular re-education;Patient/family education;Manual techniques;Taping;Energy conservation;Dry needling;Passive range of motion;Scar mobilization    PT Next Visit Plan Progress hip AROM and strengthening    Consulted and Agree with Plan of Care Patient           Patient will benefit from skilled therapeutic intervention in order to improve the following deficits and impairments:  Hypomobility, Decreased activity tolerance, Decreased strength, Increased fascial restricitons, Pain, Difficulty walking, Increased muscle spasms, Improper body mechanics, Decreased range of motion, Postural dysfunction, Impaired flexibility  Visit Diagnosis: Pain in left hip  Stiffness of left hip, not elsewhere classified  Other abnormalities of gait and mobility  Muscle weakness (generalized)  History of falling     Problem List Patient Active Problem List   Diagnosis Date Noted  . Uncontrolled type 2 diabetes mellitus with hyperglycemia ([facility] 03/16/2020  . Fatigue 03/16/2020  . Rib pain on left side 05/27/2017  . Closed left hip fracture ([place] 09/29/2016  . Fall   . Periprosthetic fracture around internal prosthetic left hip joint ([facility]   . Osteoporosis   . Benign prostatic hyperplasia without lower urinary tract symptoms   . Depression   . Mild cognitive impairment 09/23/2016  . Nuclear sclerotic cataract of left eye 04/02/2016  . Cortical age-related cataract of both eyes 01/04/2016  . Degenerative retinal drusen of both eyes 01/04/2016  . Nuclear sclerotic cataract of both eyes 01/04/2016  . Open angle with borderline findings and low glaucoma risk in both eyes 01/04/2016  . Posterior vitreous detachment of both eyes 01/04/2016  . Maxillary sinusitis, acute 11/03/2015  . DM (diabetes mellitus) type II, controlled, with peripheral vascular disorder ([place] 04/06/2015  . Acute upper respiratory infection 11/11/2014  . Prostate cancer ([facility]   . Melanoma ([facility]   .  Nonmelanoma skin cancer   . Polyposis of colon   . Influenza-like illness 09/30/2013  . Obesity (BMI 30-39.9) 05/10/2013  . Obese 06/14/2012  . S/P right THA, AA 06/12/2012  . PMR (polymyalgia rheumatica) ([place] 05/10/2011  . EXTERNAL HEMORRHOIDS 06/09/2010  . DIVERTICULOSIS, COLON 06/09/2010  . COLONIC POLYPS, HX OF 06/09/2010  . Vitamin D deficiency 05/18/2010  . TOTAL KNEE REPLACEMENT, LEFT, HX OF 01/22/2009  . Unspecified hearing loss 03/13/2008  . Thyroid nodule 08/14/2007  . PROSTATE CANCER, HX OF 06/18/2007  . Hyperlipidemia 02/28/2007  . Essential hypertension 02/28/2007  . OSTEOARTHRITIS 02/28/2007    MBess Harvest PTA 05/21/20 4:05 PM   [facility] [address] [place] [address]Phone: [phone]  Fax:  [phone] Name: Andrew MANKEMRN: [id number] of Birth: [birth year]

## 2020-05-26 ENCOUNTER — Encounter: Payer: Self-pay | Admitting: Physical Therapy

## 2020-05-26 ENCOUNTER — Other Ambulatory Visit: Payer: Self-pay

## 2020-05-26 ENCOUNTER — Ambulatory Visit: Payer: Medicare Other | Admitting: Physical Therapy

## 2020-05-26 VITALS — BP 104/60 | HR 122

## 2020-05-26 DIAGNOSIS — Z9181 History of falling: Secondary | ICD-10-CM | POA: Diagnosis not present

## 2020-05-26 DIAGNOSIS — R2689 Other abnormalities of gait and mobility: Secondary | ICD-10-CM | POA: Diagnosis not present

## 2020-05-26 DIAGNOSIS — M6281 Muscle weakness (generalized): Secondary | ICD-10-CM | POA: Diagnosis not present

## 2020-05-26 DIAGNOSIS — M25652 Stiffness of left hip, not elsewhere classified: Secondary | ICD-10-CM | POA: Diagnosis not present

## 2020-05-26 DIAGNOSIS — M25552 Pain in left hip: Secondary | ICD-10-CM

## 2020-05-26 NOTE — Therapy (Signed)
[facility] [address]  [place], [place], [address] Phone: [phone]   Fax:  [phone]  Physical Therapy Treatment  Patient Details  Name: Andrew Ramos MRN: [id number] Date of Birth: 08/07/1932 Referring Provider (PT): Rod Can, MD   Encounter Date: 05/26/2020   PT End of Session - 05/26/20 1409    Visit Number 5    Number of Visits 13    Date for PT Re-Evaluation 06/23/20    Authorization Type Medicare & Tricare    PT Start Time 1310    PT Stop Time 9373    PT Time Calculation (min) 48 min    Activity Tolerance Patient tolerated treatment well;Other (comment)   limited by lightheadedness/unstable vitals   Behavior During Therapy [facility] for tasks assessed/performed           Past Medical History:  Diagnosis Date  . Colon polyps    Tubular Adenoma 2005  . Diverticulosis   . Hepatitis    pt told by red cross has hepatitis antibodies in blood   . Hyperlipidemia   . Hypertension   . Melanoma ([place])   . Nonmelanoma skin cancer   . Osteoarthritis    right hip- none since hip replacement  . Polymyalgia rheumatica ([place])   . Polyposis of colon   . Prostate cancer ([place]) 2007   s/p radiation seed implants  . Prostate cancer ([place])   . Thyroid disease sees dr altzhimer for yearly   nodules    Past Surgical History:  Procedure Laterality Date  . CATARACT EXTRACTION    . POLYPECTOMY    . TONSILLECTOMY  as child  . TOTAL HIP ARTHROPLASTY  08/27/2008   left  . TOTAL HIP ARTHROPLASTY  06/12/2012   Procedure: TOTAL HIP ARTHROPLASTY ANTERIOR APPROACH;  Surgeon: Mauri Pole, MD;  Location: [facility];  Service: Orthopedics;  Laterality: Right;  . TOTAL KNEE ARTHROPLASTY  12/31/2008   left    Vitals:   05/26/20 1323 05/26/20 1343 05/26/20 1345 05/26/20 1348  BP: 119/63 (!) 80/50 (!) 90/54 104/60  Pulse: (!) 199 (!) 103  (!) 122  SpO2: 96% 96%  99%     Subjective Assessment - 05/26/20 1314     Subjective Noting no new changes. Does not have an appointment with his PCP any time soon.    Pertinent History thyroid disease, prostate CA with radiation 2007, polymyalgia rheumatica, melanoma, HTN, HLD, hepatitis, B THA, L TKA    Diagnostic tests none recent    Patient Stated Goals get rid of pain    Currently in Pain? No/denies                             [facility] Adult PT Treatment/Exercise - 05/26/20 0001      Knee/Hip Exercises: Stretches   Hip Flexor Stretch Left;2 reps;30 seconds    Hip Flexor Stretch Limitations sitting on airex      Knee/Hip Exercises: Aerobic   Nustep Lvl 4, 6 min (UE/LE)      Knee/Hip Exercises: Seated   Other Seated Knee/Hip Exercises sitting marching x1 min to improve orthostasis      Knee/Hip Exercises: Supine   Bridges with Ball Squeeze Strengthening;Both;1 set;10 reps    Bridges with Clamshell Strengthening;Both;1 set;10 reps   red loop above knees     Knee/Hip Exercises: Sidelying   Clams R/L clamshell x10 each   manual cues to prevent  trunk rotation posteriorly     Manual Therapy   Manual Therapy Passive ROM    Manual therapy comments R sidelying    Passive ROM L TFL stretch with kne extended 2x30"   good tolerance, still showing tightness                 PT Education - 05/26/20 1406    Education Details educated patient on orthostasis, use of ankle pumps/marching before position changes, advised patient to F/U with PCP on elevated HR and orthostasis    Person(s) Educated Patient    Methods Explanation;Demonstration    Comprehension Verbalized understanding;Returned demonstration            PT Short Term Goals - 05/21/20 1425      PT SHORT TERM GOAL #1   Title Patient to be independent with initial HEP.    Time 3    Period Weeks    Status Achieved    Target Date 06/02/20             PT Long Term Goals - 05/14/20 1417      PT LONG TERM GOAL #1   Title Patient to be independent with advanced HEP.     Time 6    Period Weeks    Status On-going      PT LONG TERM GOAL #2   Title Patient to demonstrate L hip AROM WFL and without pain limiting.    Time 6    Period Weeks    Status On-going      PT LONG TERM GOAL #3   Title Patient to demonstrate B LE strength >/=4+/5.    Time 6    Period Weeks    Status On-going      PT LONG TERM GOAL #4   Title Patient to demonstrate symmetrical step length, knee/hip flexion, and upright trunk with ambulation with LRAD.    Time 6    Period Weeks    Status On-going      PT LONG TERM GOAL #5   Title Patient to report 80% improvement in L hip pain.    Time 6    Period Weeks    Status On-going                 Plan - 05/26/20 1409    Clinical Impression Statement Patient without new complaints today. Patient describing trouble with his vitals from previous sessions, thus encouraged patient to contact his PCP for further assessment. Worked on gentle hip flexor stretching to patient's tolerance with patient demonstrating good carryover. Assessed vitals, with BP WFL but HR elevated to 119 bpm at rest. Patient asymptomatic, thus proceeded with treatment. Worked on progressive core strengthening with minor cueing for form. Patient was able to tolerate increased challenge with bridges with good tolerance and form. Still demonstrating tightness in the L TFL with stretching. Patient required manual cues to prevent trunk rotation posteriorly with clamshells. Patient demonstrated significant drop in BP with supine>sit despite being asymptomatic. Attempted to walk across the gym to continue with ther-ex, at which time patient reported lightheadedness and was relocated to a chair. Allowed patient a sitting rest break and water. Offered a transport chair out of the clinic, which patient declined. Again, encouraged patient for F/U with PCP to address orthostasis. Patient was able to ambulate out of clinic safely with family member. Plan to contact PCP to advise  of patient's unstable vitals.    Comorbidities thyroid disease, prostate CA with radiation 2007, polymyalgia  rheumatica, melanoma, HTN, HLD, hepatitis, B THA, L TKA    Rehab Potential Good    PT Frequency 2x / week    PT Duration 6 weeks    PT Treatment/Interventions ADLs/Self Care Home Management;Cryotherapy;Electrical Stimulation;Iontophoresis 4mg /ml Dexamethasone;Moist Heat;Balance training;Therapeutic exercise;Therapeutic activities;Functional mobility training;Stair training;Gait training;Ultrasound;Neuromuscular re-education;Patient/family education;Manual techniques;Taping;Energy conservation;Dry needling;Passive range of motion;Scar mobilization    PT Next Visit Plan Progress hip AROM and strengthening    Consulted and Agree with Plan of Care Patient           Patient will benefit from skilled therapeutic intervention in order to improve the following deficits and impairments:  Hypomobility, Decreased activity tolerance, Decreased strength, Increased fascial restricitons, Pain, Difficulty walking, Increased muscle spasms, Improper body mechanics, Decreased range of motion, Postural dysfunction, Impaired flexibility  Visit Diagnosis: Pain in left hip  Stiffness of left hip, not elsewhere classified  Other abnormalities of gait and mobility  Muscle weakness (generalized)  History of falling     Problem List Patient Active Problem List   Diagnosis Date Noted  . Uncontrolled type 2 diabetes mellitus with hyperglycemia ([place]) 03/16/2020  . Fatigue 03/16/2020  . Rib pain on left side 05/27/2017  . Closed left hip fracture ([place]) 09/29/2016  . Fall   . Periprosthetic fracture around internal prosthetic left hip joint ([place])   . Osteoporosis   . Benign prostatic hyperplasia without lower urinary tract symptoms   . Depression   . Mild cognitive impairment 09/23/2016  . Nuclear sclerotic cataract of left eye 04/02/2016  . Cortical age-related cataract of both eyes 01/04/2016  .  Degenerative retinal drusen of both eyes 01/04/2016  . Nuclear sclerotic cataract of both eyes 01/04/2016  . Open angle with borderline findings and low glaucoma risk in both eyes 01/04/2016  . Posterior vitreous detachment of both eyes 01/04/2016  . Maxillary sinusitis, acute 11/03/2015  . DM (diabetes mellitus) type II, controlled, with peripheral vascular disorder ([place]) 04/06/2015  . Acute upper respiratory infection 11/11/2014  . Prostate cancer ([place])   . Melanoma ([place])   . Nonmelanoma skin cancer   . Polyposis of colon   . Influenza-like illness 09/30/2013  . Obesity (BMI 30-39.9) 05/10/2013  . Obese 06/14/2012  . S/P right THA, AA 06/12/2012  . PMR (polymyalgia rheumatica) ([place]) 05/10/2011  . EXTERNAL HEMORRHOIDS 06/09/2010  . DIVERTICULOSIS, COLON 06/09/2010  . COLONIC POLYPS, HX OF 06/09/2010  . Vitamin D deficiency 05/18/2010  . TOTAL KNEE REPLACEMENT, LEFT, HX OF 01/22/2009  . Unspecified hearing loss 03/13/2008  . Thyroid nodule 08/14/2007  . PROSTATE CANCER, HX OF 06/18/2007  . Hyperlipidemia 02/28/2007  . Essential hypertension 02/28/2007  . OSTEOARTHRITIS 02/28/2007     Janene Harvey, PT, DPT 05/26/20 2:20 PM   [facility] [address]  [place], [place], [address] Phone: [phone]   Fax:  [phone]  Name: Andrew Ramos MRN: [id number] Date of Birth: [birth year]

## 2020-05-28 ENCOUNTER — Ambulatory Visit: Payer: Medicare Other

## 2020-05-28 ENCOUNTER — Other Ambulatory Visit: Payer: Self-pay

## 2020-05-28 VITALS — BP 118/64 | HR 124

## 2020-05-28 DIAGNOSIS — M25552 Pain in left hip: Secondary | ICD-10-CM | POA: Diagnosis not present

## 2020-05-28 DIAGNOSIS — M6281 Muscle weakness (generalized): Secondary | ICD-10-CM | POA: Diagnosis not present

## 2020-05-28 DIAGNOSIS — M25652 Stiffness of left hip, not elsewhere classified: Secondary | ICD-10-CM | POA: Diagnosis not present

## 2020-05-28 DIAGNOSIS — R2689 Other abnormalities of gait and mobility: Secondary | ICD-10-CM | POA: Diagnosis not present

## 2020-05-28 DIAGNOSIS — Z9181 History of falling: Secondary | ICD-10-CM | POA: Diagnosis not present

## 2020-05-28 NOTE — Therapy (Signed)
[facility] [address]  [place], [place], [address] Phone: [phone]   Fax:  [phone]  Physical Therapy Treatment  Patient Details  Name: Andrew Ramos MRN: [id number] Date of Birth: [birth year] Referring Provider (PT): Rod Can, MD   Encounter Date: 05/28/2020   PT End of Session - 05/28/20 1409    Visit Number 6    Number of Visits 13    Date for PT Re-Evaluation 06/23/20    Authorization Type Medicare & Tricare    PT Start Time 1401    PT Stop Time 0175    PT Time Calculation (min) 38 min    Activity Tolerance Patient tolerated treatment well;Other (comment)   limited by initial reading of elevated HR before warmup   Behavior During Therapy [facility] for tasks assessed/performed           Past Medical History:  Diagnosis Date  . Colon polyps    Tubular Adenoma 2005  . Diverticulosis   . Hepatitis    pt told by red cross has hepatitis antibodies in blood   . Hyperlipidemia   . Hypertension   . Melanoma ([place])   . Nonmelanoma skin cancer   . Osteoarthritis    right hip- none since hip replacement  . Polymyalgia rheumatica ([place])   . Polyposis of colon   . Prostate cancer ([place]) 2007   s/p radiation seed implants  . Prostate cancer ([place])   . Thyroid disease sees dr altzhimer for yearly   nodules    Past Surgical History:  Procedure Laterality Date  . CATARACT EXTRACTION    . POLYPECTOMY    . TONSILLECTOMY  as child  . TOTAL HIP ARTHROPLASTY  08/27/2008   left  . TOTAL HIP ARTHROPLASTY  06/12/2012   Procedure: TOTAL HIP ARTHROPLASTY ANTERIOR APPROACH;  Surgeon: Mauri Pole, MD;  Location: [facility];  Service: Orthopedics;  Laterality: Right;  . TOTAL KNEE ARTHROPLASTY  12/31/2008   left    Vitals:   05/28/20 1408 05/28/20 1438 05/28/20 1439  BP: 118/64    Pulse: 96 (!) 118 (!) 124     Subjective Assessment - 05/28/20 1408    Subjective Pt. reporting he has not yet contacted his MD  about his blood pressure changes and elevated HR in PT sessions recently.    Pertinent History thyroid disease, prostate CA with radiation 2007, polymyalgia rheumatica, melanoma, HTN, HLD, hepatitis, B THA, L TKA    Diagnostic tests none recent    Patient Stated Goals get rid of pain    Currently in Pain? No/denies    Pain Score 0-No pain    Multiple Pain Sites No              [facility] PT Assessment - 05/28/20 0001      Assessment   Medical Diagnosis L hip trochanteric bursitis    Referring Provider (PT) Rod Can, MD    Onset Date/Surgical Date 04/21/20    Hand Dominance Right    Next MD Visit 06/09/20    Prior Therapy yes- for L hip                         [facility] Adult PT Treatment/Exercise - 05/28/20 0001      Self-Care   Self-Care Other Self-Care Comments    Other Self-Care Comments  Discussion with patient regarding his excessive rise in HR and drop in BP with positional changes from  supine >sit>stand over the last few visit and its increased fall risk; pt. encouraged to reach out to the MD regarding unstables VS      Knee/Hip Exercises: Aerobic   Nustep Lvl 4, 6 min (UE/LE)      Knee/Hip Exercises: Standing   Heel Raises Both;15 reps   at counter    Heel Raises Limitations heel/toe raise     Knee Flexion Right;Left   x 12 reps    Knee Flexion Limitations with counter support       Knee/Hip Exercises: Seated   Clamshell with TheraBand Red   x 10 reps x 2 sets    Marching Right;Left;10 reps;Strengthening    Marching Limitations red TB looped at knees for resistance     Sit to Sand 10 reps;with UE support   from chair + airex pad + UE pushoff from knees x 10                   PT Short Term Goals - 05/21/20 1425      PT SHORT TERM GOAL #1   Title Patient to be independent with initial HEP.    Time 3    Period Weeks    Status Achieved    Target Date 06/02/20             PT Long Term Goals - 05/28/20 1437      PT LONG TERM GOAL  #1   Title Patient to be independent with advanced HEP.    Time 6    Period Weeks    Status On-going      PT LONG TERM GOAL #2   Title Patient to demonstrate L hip AROM WFL and without pain limiting.    Time 6    Period Weeks    Status On-going      PT LONG TERM GOAL #3   Title Patient to demonstrate B LE strength >/=4+/5.    Time 6    Period Weeks    Status On-going      PT LONG TERM GOAL #4   Title Patient to demonstrate symmetrical step length, knee/hip flexion, and upright trunk with ambulation with LRAD.    Time 6    Period Weeks    Status On-going      PT LONG TERM GOAL #5   Title Patient to report 80% improvement in L hip pain.    Time 6    Period Weeks    Status Partially Met   05/28/20: 60-70% thus far                Plan - 05/28/20 1417    Clinical Impression Statement Andrew Ramos reports he has note reached out to his primary care doctor regarding his unstable vitals as discussed last PT session.  Andrew Ramos's vitals WFL to start session with exception of elevated HR after three mins sitting rest of 96 bpm which was initially 108 bpm upon entering session.  Discussion with Andrew Ramos initially to start session about his elevated HR and decreased BP with positional changes over the last few sessions.  Pt. once again made aware that this increases his risk of falling.  Pt. did verbalized that he plans to reach out to his doctor regarding unstable VS when he gets back home today.  Vitals closely monitored throughout session and was able to progress LE strengthening with pt. asymptomatic denying dizziness/lightheadedness.  Ended visit with pt. accompanied to waiting room and leaving with his neighbor who drove  him over for PT session.    Comorbidities thyroid disease, prostate CA with radiation 2007, polymyalgia rheumatica, melanoma, HTN, HLD, hepatitis, B THA, L TKA    Rehab Potential Good    PT Frequency 2x / week    PT Duration 6 weeks    PT Treatment/Interventions ADLs/Self Care  Home Management;Cryotherapy;Electrical Stimulation;Iontophoresis 41m/ml Dexamethasone;Moist Heat;Balance training;Therapeutic exercise;Therapeutic activities;Functional mobility training;Stair training;Gait training;Ultrasound;Neuromuscular re-education;Patient/family education;Manual techniques;Taping;Energy conservation;Dry needling;Passive range of motion;Scar mobilization    PT Next Visit Plan Progress hip AROM and strengthening    Consulted and Agree with Plan of Care Patient           Patient will benefit from skilled therapeutic intervention in order to improve the following deficits and impairments:  Hypomobility, Decreased activity tolerance, Decreased strength, Increased fascial restricitons, Pain, Difficulty walking, Increased muscle spasms, Improper body mechanics, Decreased range of motion, Postural dysfunction, Impaired flexibility  Visit Diagnosis: Pain in left hip  Stiffness of left hip, not elsewhere classified  Other abnormalities of gait and mobility  Muscle weakness (generalized)  History of falling     Problem List Patient Active Problem List   Diagnosis Date Noted  . Uncontrolled type 2 diabetes mellitus with hyperglycemia ([place] 03/16/2020  . Fatigue 03/16/2020  . Rib pain on left side 05/27/2017  . Closed left hip fracture ([place] 09/29/2016  . Fall   . Periprosthetic fracture around internal prosthetic left hip joint ([place]   . Osteoporosis   . Benign prostatic hyperplasia without lower urinary tract symptoms   . Depression   . Mild cognitive impairment 09/23/2016  . Nuclear sclerotic cataract of left eye 04/02/2016  . Cortical age-related cataract of both eyes 01/04/2016  . Degenerative retinal drusen of both eyes 01/04/2016  . Nuclear sclerotic cataract of both eyes 01/04/2016  . Open angle with borderline findings and low glaucoma risk in both eyes 01/04/2016  . Posterior vitreous detachment of both eyes 01/04/2016  . Maxillary sinusitis, acute  11/03/2015  . DM (diabetes mellitus) type II, controlled, with peripheral vascular disorder ([place] 04/06/2015  . Acute upper respiratory infection 11/11/2014  . Prostate cancer ([place]   . Melanoma ([place]   . Nonmelanoma skin cancer   . Polyposis of colon   . Influenza-like illness 09/30/2013  . Obesity (BMI 30-39.9) 05/10/2013  . Obese 06/14/2012  . S/P right THA, AA 06/12/2012  . PMR (polymyalgia rheumatica) ([place] 05/10/2011  . EXTERNAL HEMORRHOIDS 06/09/2010  . DIVERTICULOSIS, COLON 06/09/2010  . COLONIC POLYPS, HX OF 06/09/2010  . Vitamin D deficiency 05/18/2010  . TOTAL KNEE REPLACEMENT, LEFT, HX OF 01/22/2009  . Unspecified hearing loss 03/13/2008  . Thyroid nodule 08/14/2007  . PROSTATE CANCER, HX OF 06/18/2007  . Hyperlipidemia 02/28/2007  . Essential hypertension 02/28/2007  . OSTEOARTHRITIS 02/28/2007    MBess Harvest PTA 05/28/20 4:51 PM   [facility] [address] [place] [address]Phone: [phone]  Fax:  [phone] Name: Andrew CLAUSONMRN: [id number] of Birth: [birth year]

## 2020-06-02 ENCOUNTER — Other Ambulatory Visit: Payer: Self-pay

## 2020-06-02 ENCOUNTER — Ambulatory Visit: Payer: Medicare Other

## 2020-06-02 VITALS — BP 120/70 | HR 136

## 2020-06-02 DIAGNOSIS — R2689 Other abnormalities of gait and mobility: Secondary | ICD-10-CM | POA: Diagnosis not present

## 2020-06-02 DIAGNOSIS — Z9181 History of falling: Secondary | ICD-10-CM

## 2020-06-02 DIAGNOSIS — M25652 Stiffness of left hip, not elsewhere classified: Secondary | ICD-10-CM

## 2020-06-02 DIAGNOSIS — M25552 Pain in left hip: Secondary | ICD-10-CM

## 2020-06-02 DIAGNOSIS — M6281 Muscle weakness (generalized): Secondary | ICD-10-CM

## 2020-06-02 NOTE — Therapy (Signed)
[facility] [address]  [place], [place], [address] Phone: [phone]   Fax:  [phone]  Physical Therapy Treatment  Patient Details  Name: Andrew Ramos MRN: [id number] Date of Birth: [birth year] Referring Provider (PT): Rod Can, MD   Encounter Date: 06/02/2020   PT End of Session - 06/02/20 1408    Visit Number 7    Number of Visits 13    Date for PT Re-Evaluation 06/23/20    Authorization Type Medicare & Tricare    PT Start Time 1401    PT Stop Time 1439    PT Time Calculation (min) 38 min    Activity Tolerance Patient tolerated treatment well    Behavior During Therapy [facility] for tasks assessed/performed           Past Medical History:  Diagnosis Date  . Colon polyps    Tubular Adenoma 2005  . Diverticulosis   . Hepatitis    pt told by red cross has hepatitis antibodies in blood   . Hyperlipidemia   . Hypertension   . Melanoma ([place])   . Nonmelanoma skin cancer   . Osteoarthritis    right hip- none since hip replacement  . Polymyalgia rheumatica ([place])   . Polyposis of colon   . Prostate cancer ([place]) 2007   s/p radiation seed implants  . Prostate cancer ([place])   . Thyroid disease sees dr altzhimer for yearly   nodules    Past Surgical History:  Procedure Laterality Date  . CATARACT EXTRACTION    . POLYPECTOMY    . TONSILLECTOMY  as child  . TOTAL HIP ARTHROPLASTY  08/27/2008   left  . TOTAL HIP ARTHROPLASTY  06/12/2012   Procedure: TOTAL HIP ARTHROPLASTY ANTERIOR APPROACH;  Surgeon: Mauri Pole, MD;  Location: [facility];  Service: Orthopedics;  Laterality: Right;  . TOTAL KNEE ARTHROPLASTY  12/31/2008   left    Vitals:   06/02/20 1407 06/02/20 1427  BP: 120/70   Pulse: 96 (!) 136  SpO2: 96%      Subjective Assessment - 06/02/20 1407    Subjective Pt. reporting he has an app with orthopedic doctor on 09/21.    Pertinent History thyroid disease, prostate CA with radiation  2007, polymyalgia rheumatica, melanoma, HTN, HLD, hepatitis, B THA, L TKA    Diagnostic tests none recent    Patient Stated Goals get rid of pain    Currently in Pain? No/denies    Pain Score 0-No pain                             [facility] Adult PT Treatment/Exercise - 06/02/20 0001      Knee/Hip Exercises: Aerobic   Nustep Lvl 4, 6 min (UE/LE)      Knee/Hip Exercises: Standing   Heel Raises Both;15 reps    Heel Raises Limitations heel/toe raise     Knee Flexion Right;Left;10 reps;Strengthening    Knee Flexion Limitations 2# at ankles; TM rail support     Hip Flexion Right;Left;10 reps;Knee bent;Stengthening    Hip Flexion Limitations 2# at ankles; TM rail support     Functional Squat 2 sets;5 reps    Functional Squat Limitations at TM rail       Knee/Hip Exercises: Seated   Clamshell with TheraBand Red   x 15 reps    Marching Right;Left;10 reps;Strengthening    Marching Limitations red TB looped at  knees for resistance                     PT Short Term Goals - 05/21/20 1425      PT SHORT TERM GOAL #1   Title Patient to be independent with initial HEP.    Time 3    Period Weeks    Status Achieved    Target Date 06/02/20             PT Long Term Goals - 05/28/20 1437      PT LONG TERM GOAL #1   Title Patient to be independent with advanced HEP.    Time 6    Period Weeks    Status On-going      PT LONG TERM GOAL #2   Title Patient to demonstrate L hip AROM WFL and without pain limiting.    Time 6    Period Weeks    Status On-going      PT LONG TERM GOAL #3   Title Patient to demonstrate B LE strength >/=4+/5.    Time 6    Period Weeks    Status On-going      PT LONG TERM GOAL #4   Title Patient to demonstrate symmetrical step length, knee/hip flexion, and upright trunk with ambulation with LRAD.    Time 6    Period Weeks    Status On-going      PT LONG TERM GOAL #5   Title Patient to report 80% improvement in L hip pain.     Time 6    Period Weeks    Status Partially Met   05/28/20: 60-70% thus far                Plan - 06/02/20 1408    Clinical Impression Statement Andrew Ramos reporting some dizziness this morning while working in the kitchen however presenting today to start session and throughout session asymptomatic.  Vitals recorded to start session WNL.  HR continues to be elevated throughout standing and seated therex requiring prolonged sitting rest break for recovery.  Andrew Ramos reporting he has not had significant L hip pain in ~ 3 weeks.  Notes good improvement since start therapy and consistently performing HEP.  Ended visit pain free.    Comorbidities thyroid disease, prostate CA with radiation 2007, polymyalgia rheumatica, melanoma, HTN, HLD, hepatitis, B THA, L TKA    Rehab Potential Good    PT Frequency 2x / week    PT Duration 6 weeks    PT Treatment/Interventions ADLs/Self Care Home Management;Cryotherapy;Electrical Stimulation;Iontophoresis 4m/ml Dexamethasone;Moist Heat;Balance training;Therapeutic exercise;Therapeutic activities;Functional mobility training;Stair training;Gait training;Ultrasound;Neuromuscular re-education;Patient/family education;Manual techniques;Taping;Energy conservation;Dry needling;Passive range of motion;Scar mobilization    PT Next Visit Plan Progress hip AROM and strengthening    Consulted and Agree with Plan of Care Patient           Patient will benefit from skilled therapeutic intervention in order to improve the following deficits and impairments:  Hypomobility, Decreased activity tolerance, Decreased strength, Increased fascial restricitons, Pain, Difficulty walking, Increased muscle spasms, Improper body mechanics, Decreased range of motion, Postural dysfunction, Impaired flexibility  Visit Diagnosis: Pain in left hip  Stiffness of left hip, not elsewhere classified  Other abnormalities of gait and mobility  Muscle weakness (generalized)  History of  falling     Problem List Patient Active Problem List   Diagnosis Date Noted  . Uncontrolled type 2 diabetes mellitus with hyperglycemia ([place] 03/16/2020  . Fatigue 03/16/2020  . Rib  pain on left side 05/27/2017  . Closed left hip fracture ([place]) 09/29/2016  . Fall   . Periprosthetic fracture around internal prosthetic left hip joint ([place])   . Osteoporosis   . Benign prostatic hyperplasia without lower urinary tract symptoms   . Depression   . Mild cognitive impairment 09/23/2016  . Nuclear sclerotic cataract of left eye 04/02/2016  . Cortical age-related cataract of both eyes 01/04/2016  . Degenerative retinal drusen of both eyes 01/04/2016  . Nuclear sclerotic cataract of both eyes 01/04/2016  . Open angle with borderline findings and low glaucoma risk in both eyes 01/04/2016  . Posterior vitreous detachment of both eyes 01/04/2016  . Maxillary sinusitis, acute 11/03/2015  . DM (diabetes mellitus) type II, controlled, with peripheral vascular disorder ([place]) 04/06/2015  . Acute upper respiratory infection 11/11/2014  . Prostate cancer ([place])   . Melanoma ([place])   . Nonmelanoma skin cancer   . Polyposis of colon   . Influenza-like illness 09/30/2013  . Obesity (BMI 30-39.9) 05/10/2013  . Obese 06/14/2012  . S/P right THA, AA 06/12/2012  . PMR (polymyalgia rheumatica) ([place]) 05/10/2011  . EXTERNAL HEMORRHOIDS 06/09/2010  . DIVERTICULOSIS, COLON 06/09/2010  . COLONIC POLYPS, HX OF 06/09/2010  . Vitamin D deficiency 05/18/2010  . TOTAL KNEE REPLACEMENT, LEFT, HX OF 01/22/2009  . Unspecified hearing loss 03/13/2008  . Thyroid nodule 08/14/2007  . PROSTATE CANCER, HX OF 06/18/2007  . Hyperlipidemia 02/28/2007  . Essential hypertension 02/28/2007  . OSTEOARTHRITIS 02/28/2007    Bess Harvest, PTA 06/02/20 2:49 PM   [facility] [address]  [place], [place], [address] Phone: [phone]   Fax:   [phone]  Name: Andrew Ramos MRN: [id number] Date of Birth: 15-Sep-1932

## 2020-06-04 ENCOUNTER — Ambulatory Visit: Payer: Medicare Other | Admitting: Physical Therapy

## 2020-06-04 ENCOUNTER — Encounter: Payer: Self-pay | Admitting: Physical Therapy

## 2020-06-04 ENCOUNTER — Encounter: Payer: Self-pay | Admitting: Family Medicine

## 2020-06-04 ENCOUNTER — Other Ambulatory Visit: Payer: Self-pay

## 2020-06-04 ENCOUNTER — Ambulatory Visit (INDEPENDENT_AMBULATORY_CARE_PROVIDER_SITE_OTHER): Payer: Medicare Other | Admitting: Family Medicine

## 2020-06-04 VITALS — BP 110/60 | HR 92 | Temp 97.9°F | Resp 18 | Ht 72.0 in | Wt 187.4 lb

## 2020-06-04 DIAGNOSIS — Z23 Encounter for immunization: Secondary | ICD-10-CM

## 2020-06-04 DIAGNOSIS — E1165 Type 2 diabetes mellitus with hyperglycemia: Secondary | ICD-10-CM

## 2020-06-04 DIAGNOSIS — R5383 Other fatigue: Secondary | ICD-10-CM | POA: Diagnosis not present

## 2020-06-04 DIAGNOSIS — E1169 Type 2 diabetes mellitus with other specified complication: Secondary | ICD-10-CM | POA: Diagnosis not present

## 2020-06-04 DIAGNOSIS — E559 Vitamin D deficiency, unspecified: Secondary | ICD-10-CM

## 2020-06-04 DIAGNOSIS — Z9181 History of falling: Secondary | ICD-10-CM

## 2020-06-04 DIAGNOSIS — R42 Dizziness and giddiness: Secondary | ICD-10-CM | POA: Diagnosis not present

## 2020-06-04 DIAGNOSIS — E785 Hyperlipidemia, unspecified: Secondary | ICD-10-CM | POA: Diagnosis not present

## 2020-06-04 DIAGNOSIS — I10 Essential (primary) hypertension: Secondary | ICD-10-CM

## 2020-06-04 DIAGNOSIS — M25652 Stiffness of left hip, not elsewhere classified: Secondary | ICD-10-CM | POA: Diagnosis not present

## 2020-06-04 DIAGNOSIS — M6281 Muscle weakness (generalized): Secondary | ICD-10-CM | POA: Diagnosis not present

## 2020-06-04 DIAGNOSIS — M353 Polymyalgia rheumatica: Secondary | ICD-10-CM | POA: Diagnosis not present

## 2020-06-04 DIAGNOSIS — R269 Unspecified abnormalities of gait and mobility: Secondary | ICD-10-CM

## 2020-06-04 DIAGNOSIS — R2689 Other abnormalities of gait and mobility: Secondary | ICD-10-CM | POA: Diagnosis not present

## 2020-06-04 DIAGNOSIS — I471 Supraventricular tachycardia: Secondary | ICD-10-CM | POA: Diagnosis not present

## 2020-06-04 DIAGNOSIS — M25552 Pain in left hip: Secondary | ICD-10-CM

## 2020-06-04 NOTE — Assessment & Plan Note (Signed)
Tolerating statin, encouraged heart healthy diet, avoid trans fats, minimize simple carbs and saturated fats. Increase exercise as tolerated 

## 2020-06-04 NOTE — Therapy (Addendum)
[facility] [address]  [place], [place], [address] Phone: [phone]   Fax:  [phone]  Physical Therapy Progress Note  Patient Details  Name: Andrew Ramos MRN: [id number] Date of Birth: 03-04-1932 Referring Provider (PT): Rod Can, MD  Progress Note Reporting Period 05/12/20 to 06/04/20  See note below for Objective Data and Assessment of Progress/Goals.     Encounter Date: 06/04/2020   PT End of Session - 06/04/20 1441    Visit Number 8    Number of Visits 13    Date for PT Re-Evaluation 06/23/20    Authorization Type Medicare & Tricare    PT Start Time 1355    PT Stop Time 1440    PT Time Calculation (min) 45 min    Activity Tolerance Patient tolerated treatment well    Behavior During Therapy WFL for tasks assessed/performed           Past Medical History:  Diagnosis Date  . Colon polyps    Tubular Adenoma 2005  . Diverticulosis   . Hepatitis    pt told by [facility] cross has hepatitis antibodies in blood   . Hyperlipidemia   . Hypertension   . Melanoma ([place])   . Nonmelanoma skin cancer   . Osteoarthritis    right hip- none since hip replacement  . Polymyalgia rheumatica ([place])   . Polyposis of colon   . Prostate cancer ([place]) 2007   s/p radiation seed implants  . Prostate cancer ([place])   . Thyroid disease sees dr altzhimer for yearly   nodules    Past Surgical History:  Procedure Laterality Date  . CATARACT EXTRACTION    . POLYPECTOMY    . TONSILLECTOMY  as child  . TOTAL HIP ARTHROPLASTY  08/27/2008   left  . TOTAL HIP ARTHROPLASTY  06/12/2012   Procedure: TOTAL HIP ARTHROPLASTY ANTERIOR APPROACH;  Surgeon: Mauri Pole, MD;  Location: [facility];  Service: Orthopedics;  Laterality: Right;  . TOTAL KNEE ARTHROPLASTY  12/31/2008   left    There were no vitals filed for this visit.   Subjective Assessment - 06/04/20 1357    Subjective Saw his PCP today and notes that all  tests were pretty much normal. Seeing ortho on Tuesday, so he will not be coming to therapy.    Pertinent History thyroid disease, prostate CA with radiation 2007, polymyalgia rheumatica, melanoma, HTN, HLD, hepatitis, B THA, L TKA    Diagnostic tests none recent    Patient Stated Goals get rid of pain    Currently in Pain? No/denies              [facility] PT Assessment - 06/04/20 0001      Assessment   Medical Diagnosis L hip trochanteric bursitis    Referring Provider (PT) Rod Can, MD    Onset Date/Surgical Date 04/21/20      Observation/Other Assessments   Focus on Therapeutic Outcomes (FOTO)  55% (45% limitation)      AROM   Left Hip Flexion 108    Left Hip External Rotation  26    Left Hip Internal Rotation  11      Strength   Right Hip Flexion 4+/5    Right Hip Extension 4/5    Right Hip External Rotation  4+/5    Right Hip Internal Rotation 4+/5    Right Hip ABduction 4+/5    Right Hip ADduction 4+/5    Left Hip  Flexion 4+/5    Left Hip Extension 4/5    Left Hip External Rotation 4/5    Left Hip Internal Rotation 4+/5    Left Hip ABduction 4+/5    Left Hip ADduction 4+/5    Right Knee Flexion 4/5    Right Knee Extension 4+/5    Left Knee Flexion 4/5    Left Knee Extension 4+/5    Right Ankle Dorsiflexion 4+/5    Right Ankle Plantar Flexion 4+/5    Left Ankle Dorsiflexion 4+/5    Left Ankle Plantar Flexion 4+/5                         [facility] Adult PT Treatment/Exercise - 06/04/20 0001      Ambulation/Gait   Ambulation Distance (Feet) 100 Feet    Assistive device Straight cane    Gait Pattern Step-through pattern;Lateral hip instability;Right flexed knee in stance;Left flexed knee in stance;Trunk flexed    Ambulation Surface Level;Indoor    Gait velocity WNL    Gait Comments much improved continuity and speed of gait anf posture throughout gait cycle; adjusted height of SPC for improved comfort      Knee/Hip Exercises: Aerobic   Nustep  Lvl 4, 6 min (UE/LE)      Knee/Hip Exercises: Standing   Knee Flexion Strengthening;Right;Left;1 set;10 reps    Knee Flexion Limitations yellow loop   more difficulty than in sitting   Other Standing Knee Exercises sidestepping with yellow loop around ankles 4x length of counter    cues for chest up and maintaining toes facing forward     Knee/Hip Exercises: Seated   Hamstring Curl Strengthening;Right;Left;1 set;10 reps    Hamstring Limitations green TB   cues to promote anterior pelvic tilt d/t slouching                 PT Education - 06/04/20 1440    Education Details update to HEP; discussion on objective progress and remaining impairments    Person(s) Educated Patient    Methods Explanation;Demonstration;Tactile cues;Verbal cues;Handout    Comprehension Verbalized understanding;Returned demonstration            PT Short Term Goals - 06/04/20 1400      PT SHORT TERM GOAL #1   Title Patient to be independent with initial HEP.    Time 3    Period Weeks    Status Achieved    Target Date 06/02/20             PT Long Term Goals - 06/04/20 1400      PT LONG TERM GOAL #1   Title Patient to be independent with advanced HEP.    Time 6    Period Weeks    Status Partially Met   met for current     PT LONG TERM GOAL #2   Title Patient to demonstrate L hip AROM WFL and without pain limiting.    Time 6    Period Weeks    Status Achieved   improved in flexion and ER     PT LONG TERM GOAL #3   Title Patient to demonstrate B LE strength >/=4+/5.    Time 6    Period Weeks    Status Partially Met   improved in B hip flexion, L hip ER, IR, abduction, adduction     PT LONG TERM GOAL #4   Title Patient to demonstrate symmetrical step length, knee/hip flexion, and upright trunk with  ambulation with LRAD.    Time 6    Period Weeks    Status Achieved      PT LONG TERM GOAL #5   Title Patient to report 80% improvement in L hip pain.    Time 6    Period Weeks     Status Achieved   reports 99.9% improvement                Plan - 06/04/20 1801    Clinical Impression Statement Patient arrived to session with report of 99.9% improvement in L hip since initial eval. Notes that he has not had pain since the first week. Notes that he was seen by his PCP for dizziness today and reports that no apparent cause of his dizziness was found. Patient today was able to demonstrate much improved continuity, speed, and posture throughout gait cycle; adjusted height of SPC for improved comfort. Gait and subjective pain goals met at this time. L hip AROM has improved in flexion and ER. Strength testing revealed improved B hip flexion, L hip ER, IR, abduction, adduction strength. Patient now most limiting in B hip extension and B hamstring strength. Worked on addressing HS and hip strength to address remaining goals. Patient noted most benefit with standing vs. sitting HS curls. Updated this exercise into HEP- patient reported understanding. Patient without complaints at end of session. Patient progressing well towards goals.    Comorbidities thyroid disease, prostate CA with radiation 2007, polymyalgia rheumatica, melanoma, HTN, HLD, hepatitis, B THA, L TKA    Rehab Potential Good    PT Frequency 2x / week    PT Duration 6 weeks    PT Treatment/Interventions ADLs/Self Care Home Management;Cryotherapy;Electrical Stimulation;Iontophoresis 25m/ml Dexamethasone;Moist Heat;Balance training;Therapeutic exercise;Therapeutic activities;Functional mobility training;Stair training;Gait training;Ultrasound;Neuromuscular re-education;Patient/family education;Manual techniques;Taping;Energy conservation;Dry needling;Passive range of motion;Scar mobilization    PT Next Visit Plan Progress hip AROM and strengthening    Consulted and Agree with Plan of Care Patient           Patient will benefit from skilled therapeutic intervention in order to improve the following deficits and  impairments:  Hypomobility, Decreased activity tolerance, Decreased strength, Increased fascial restricitons, Pain, Difficulty walking, Increased muscle spasms, Improper body mechanics, Decreased range of motion, Postural dysfunction, Impaired flexibility  Visit Diagnosis: Pain in left hip  Stiffness of left hip, not elsewhere classified  Other abnormalities of gait and mobility  Muscle weakness (generalized)  History of falling     Problem List Patient Active Problem List   Diagnosis Date Noted  . Uncontrolled type 2 diabetes mellitus with hyperglycemia ([place] 03/16/2020  . Fatigue 03/16/2020  . Rib pain on left side 05/27/2017  . Closed left hip fracture ([place] 09/29/2016  . Fall   . Periprosthetic fracture around internal prosthetic left hip joint ([place]   . Osteoporosis   . Benign prostatic hyperplasia without lower urinary tract symptoms   . Depression   . Mild cognitive impairment 09/23/2016  . Nuclear sclerotic cataract of left eye 04/02/2016  . Cortical age-related cataract of both eyes 01/04/2016  . Degenerative retinal drusen of both eyes 01/04/2016  . Nuclear sclerotic cataract of both eyes 01/04/2016  . Open angle with borderline findings and low glaucoma risk in both eyes 01/04/2016  . Posterior vitreous detachment of both eyes 01/04/2016  . Maxillary sinusitis, acute 11/03/2015  . DM (diabetes mellitus) type II, controlled, with peripheral vascular disorder ([facility] 04/06/2015  . Acute upper respiratory infection 11/11/2014  . Prostate cancer ([place]   .  Melanoma ([place])   . Nonmelanoma skin cancer   . Polyposis of colon   . Influenza-like illness 09/30/2013  . Obesity (BMI 30-39.9) 05/10/2013  . Obese 06/14/2012  . S/P right THA, AA 06/12/2012  . PMR (polymyalgia rheumatica) ([place]) 05/10/2011  . EXTERNAL HEMORRHOIDS 06/09/2010  . DIVERTICULOSIS, COLON 06/09/2010  . COLONIC POLYPS, HX OF 06/09/2010  . Vitamin D deficiency 05/18/2010  . TOTAL KNEE REPLACEMENT,  LEFT, HX OF 01/22/2009  . Unspecified hearing loss 03/13/2008  . Thyroid nodule 08/14/2007  . PROSTATE CANCER, HX OF 06/18/2007  . Hyperlipidemia 02/28/2007  . Essential hypertension 02/28/2007  . OSTEOARTHRITIS 02/28/2007    Janene Harvey, PT, DPT 06/04/20 6:02 PM   [facility] [address]  [place], [place], [address] Phone: [phone]   Fax:  [phone]  Name: Andrew Ramos MRN: [id number] Date of Birth: 04-20-1932  PHYSICAL THERAPY DISCHARGE SUMMARY  Visits from Start of Care: 8  Current functional level related to goals / functional outcomes: See above clinical impression; patient reports that he was released by MD   Remaining deficits: Decreased LE strength   Education / Equipment: HEP  Plan: Patient agrees to discharge.  Patient goals were partially met. Patient is being discharged due to the physician's request.  ?????     Janene Harvey, PT, DPT 07/07/20 2:26 PM

## 2020-06-04 NOTE — Progress Notes (Signed)
Patient ID: Andrew Ramos, male    DOB: Aug 03, 1932  Age: 86 y.o. MRN: [id number]    Subjective:  Subjective  HPI Fadi Menter Savino presents for dm, chol and bp.   He saw rheum for l hip pain and they gave him an antiinflammatory and ortho put him in PT and he feels better Pt said he bp was running high and his pulse so they wanted him to come in -- pt has no compliants    HYPERTENSION   Blood pressure range-not checking   Chest pain- no      Dyspnea- no Lightheadedness- no   Edema- no  Other side effects - no   Medication compliance: good Low salt diet- yes    DIABETES    Blood Sugar ranges-good per pt   Polyuria- no New Visual problems- no  Hypoglycemic symptoms- no  Other side effects-no Medication compliance - good Eye exam-- due Foot exam- today    HYPERLIPIDEMIA  Medication compliance- good  RUQ pain- no  Muscle aches- no Other side effects-no      Review of Systems  Constitutional: Negative for appetite change, diaphoresis, fatigue and unexpected weight change.  Eyes: Negative for pain, redness and visual disturbance.  Respiratory: Negative for cough, chest tightness, shortness of breath and wheezing.   Cardiovascular: Negative for chest pain, palpitations and leg swelling.  Endocrine: Negative for cold intolerance, heat intolerance, polydipsia, polyphagia and polyuria.  Genitourinary: Negative for difficulty urinating, dysuria and frequency.  Neurological: Negative for dizziness, light-headedness, numbness and headaches.    History Past Medical History:  Diagnosis Date  . Colon polyps    Tubular Adenoma 2005  . Diverticulosis   . Hepatitis    pt told by red cross has hepatitis antibodies in blood   . Hyperlipidemia   . Hypertension   . Melanoma ([place])   . Nonmelanoma skin cancer   . Osteoarthritis    right hip- none since hip replacement  . Polymyalgia rheumatica ([place])   . Polyposis of colon   . Prostate cancer ([place]) 2007   s/p radiation seed  implants  . Prostate cancer ([place])   . Thyroid disease sees dr altzhimer for yearly   nodules    He has a past surgical history that includes Total knee arthroplasty (12/31/2008); Total hip arthroplasty (08/27/2008); Tonsillectomy (as child); Total hip arthroplasty (06/12/2012); Polypectomy; and Cataract extraction.   His family history includes Coronary artery disease (age of onset: 40) in his mother; Coronary artery disease (age of onset: 3) in his father; Heart disease (age of onset: 48) in his mother; Pancreatic cancer (age of onset: 36) in his brother; Pancreatic cancer (age of onset: 35) in his father.He reports that he has never smoked. He has never used smokeless tobacco. He reports current alcohol use. He reports that he does not use drugs.  Current Outpatient Medications on File Prior to Visit  Medication Sig Dispense Refill  . aspirin 81 MG tablet Take 1 tablet (81 mg total) by mouth daily. 30 tablet   . atorvastatin (LIPITOR) 40 MG tablet TAKE 1 TABLET DAILY 90 tablet 1  . Calcium Carbonate-Vitamin D (CALCIUM 600 + D PO) Take 1 tablet by mouth every morning.     . Cholecalciferol (VITAMIN D3) 1000 UNITS tablet Take 1,000 Units by mouth every morning.     . donepezil (ARICEPT) 10 MG tablet Take 1 tablet (10 mg total) by mouth at bedtime. 90 tablet 3  . fluticasone (FLONASE) 50 MCG/ACT nasal spray Place 2  sprays into both nostrils daily. 16 g 6  . hydroxychloroquine (PLAQUENIL) 200 MG tablet Take 200 mg by mouth 2 (two) times daily.    [place] Kitchen lisinopril-hydrochlorothiazide (ZESTORETIC) 10-12.5 MG tablet TAKE 1 TABLET DAILY 90 tablet 3  . loratadine (CLARITIN) 10 MG tablet TAKE 1 TABLET DAILY 90 tablet 3  . Multiple Vitamins-Minerals (CENTRUM SILVER ULTRA MENS) TABS Take 1 tablet by mouth every evening.     . polyethylene glycol powder (GLYCOLAX/MIRALAX) powder Take 17 g by mouth daily as needed for moderate constipation. 1 scoop daily     . terazosin (HYTRIN) 1 MG capsule TAKE 1 CAPSULE AT  BEDTIME 90 capsule 3  . venlafaxine XR (EFFEXOR-XR) 75 MG 24 hr capsule Take 1 capsule (75 mg total) by mouth daily with breakfast. 90 capsule 1   No current facility-administered medications on file prior to visit.     Objective:  Objective  Physical Exam Vitals and nursing note reviewed.  Constitutional:      General: He is sleeping.     Appearance: He is well-developed.  HENT:     Head: Normocephalic and atraumatic.  Eyes:     Pupils: Pupils are equal, round, and reactive to light.  Neck:     Thyroid: No thyromegaly.  Cardiovascular:     Rate and Rhythm: Normal rate and regular rhythm.     Heart sounds: No murmur heard.   Pulmonary:     Effort: Pulmonary effort is normal. No respiratory distress.     Breath sounds: Normal breath sounds. No wheezing or rales.  Chest:     Chest wall: No tenderness.  Musculoskeletal:        General: No tenderness.     Cervical back: Normal range of motion and neck supple.  Skin:    General: Skin is warm and dry.  Neurological:     Mental Status: He is oriented to person, place, and time.  Psychiatric:        Behavior: Behavior normal.        Thought Content: Thought content normal.        Judgment: Judgment normal.    Diabetic Foot Exam - Simple   Simple Foot Form Diabetic Foot exam was performed with the following findings: Yes 06/04/2020 10:18 AM  Visual Inspection No deformities, no ulcerations, no other skin breakdown bilaterally: Yes Sensation Testing Intact to touch and monofilament testing bilaterally: Yes Pulse Check Posterior Tibialis and Dorsalis pulse intact bilaterally: Yes Comments     BP 110/60 (BP Location: Right Arm, Patient Position: Sitting, Cuff Size: Normal)   Pulse 92   Temp 97.9 F (36.6 C) (Oral)   Resp 18   Ht 6' (1.829 m)   Wt 187 lb 6.4 oz (85 kg)   SpO2 95%   BMI 25.42 kg/m  Wt Readings from Last 3 Encounters:  06/04/20 187 lb 6.4 oz (85 kg)  03/16/20 206 lb (93.4 kg)  10/08/19 220 lb (99.8  kg)     Lab Results  Component Value Date   WBC 3.8 (L) 03/16/2020   HGB 11.4 (L) 03/16/2020   HCT 33.5 (L) 03/16/2020   PLT 82.0 (L) 03/16/2020   GLUCOSE 97 03/16/2020   CHOL 109 03/16/2020   TRIG 68.0 03/16/2020   HDL 54.90 03/16/2020   LDLCALC 40 03/16/2020   ALT 13 03/16/2020   AST 16 03/16/2020   NA 141 03/16/2020   K 4.1 03/16/2020   CL 103 03/16/2020   CREATININE 0.95 03/16/2020   BUN 19  03/16/2020   CO2 30 03/16/2020   TSH 1.25 03/16/2020   PSA 0.05 (L) 03/16/2020   INR 1.06 09/29/2016   HGBA1C 5.8 03/16/2020   MICROALBUR <0.7 04/06/2015    DG BONE DENSITY (DXA)  Result Date: 07/26/2019 EXAM: DUAL X-RAY ABSORPTIOMETRY (DXA) FOR BONE MINERAL DENSITY IMPRESSION: [place] G YOUNG Your patient Barret Esquivel completed a BMD test on 07/26/2019 using the [place] (analysis version: 16.SP2) manufactured by [employer]. The following summarizes the results of our evaluation. [facility] PATIENT: Name: Ramiro, Pangilinan Patient ID: [id number] Birth Date: August 01, 1932 Height: 71.5 in. Gender: Male Measured: 07/26/2019 Weight: 215.4 lbs. Indications: Advanced Age, Caucasian, History of Osteoporosis, Rheumatoid Arthritis Fractures: Treatments: Calcium, Fosamax(Alendronate), Multivitamin, Vitamin D ASSESSMENT: The BMD measured at Forearm Radius 33% is 0.894 g/cm2 with a T-score of 0.2. This patient is considered normal according to [facility]) criteria. I did not scan the hips because of bilateral hip replacements. The scan quality is good. Site Region Measured Date Measured Age WHO YA BMD Classification T-score AP Spine L1-L4 07/26/2019 85.4 Normal 3.1 1.553 g/cm2 Left Forearm Radius 33% 07/26/2019 85.4 Normal 0.2 0.894 g/cm2 World Health Organization [facility]) criteria for post-menopausal, Caucasian Women: Normal       T-score at or above -1 SD Osteopenia   T-score between -1 and -2.5 SD Osteoporosis T-score at or below -2.5 SD RECOMMENDATION:1. All patients should  optimize calcium and vitamin D intake. 2. Consider FDA-approved medical therapies in postmenopausal women and men aged 58 years and older, based on the following: a. A hip or vertebral(clinical or morphometric) fracture. b. T-Score < -2.5 at the femoral neck or spine after appropriate evaluation to exclude secondary causes c. Low bone mass (T-score between -1.0 and -2.5 at the femoral neck or spine) and a 10 year probability of a hip fracture >3% or a 10 year probability of major osteoporosis-related fracture > 20% based on the [place]-adapted WHO algorithm d. Clinical judgement and/or patient preferences may indicate treatment for people with 10-year fracture probabilities above or below these levels FOLLOW-UP: Patients with diagnosis of osteoporosis or at high risk for fracture should have regular bone mineral density tests. For patients eligible for Medicare, routine testing is allowed once every 2 years. The testing frequency can be increased to one year for patients who have rapidly progressing disease, those who are receiving or discontinuing medical therapy to restore bone mass, or have additional risk factors. I have reviewed this report and agree with the above findings. [facility] Radiology Electronically Signed   By: Lowella Grip III M.D.   On: 07/26/2019 11:01     Assessment & Plan:  Plan  I am having Keilyn C. Dieudonne "Mikki Santee" maintain his Centrum Silver Ultra Mens, cholecalciferol, Calcium Carbonate-Vitamin D (CALCIUM 600 + D PO), aspirin, polyethylene glycol powder, fluticasone, terazosin, donepezil, atorvastatin, loratadine, lisinopril-hydrochlorothiazide, hydroxychloroquine, and venlafaxine XR.  No orders of the defined types were placed in this encounter.   Problem List Items Addressed This Visit      Unprioritized   Essential hypertension    Well controlled, no changes to meds. Encouraged heart healthy diet such as the DASH diet and exercise as tolerated.       Relevant Orders    Lipid panel   Microalbumin / creatinine urine ratio   Comprehensive metabolic panel   Vitamin X93   Fatigue   Relevant Orders   Vitamin B12   TSH   Hyperlipidemia    Tolerating statin, encouraged heart healthy diet,  avoid trans fats, minimize simple carbs and saturated fats. Increase exercise as tolerated      PMR (polymyalgia rheumatica) (HCC)    Per rheum      Uncontrolled type 2 diabetes mellitus with hyperglycemia ([place])    hgba1c to be checked , minimize simple carbs. Increase exercise as tolerated. Continue current meds       Vitamin D deficiency   Relevant Orders   Vitamin D (25 hydroxy)    Other Visit Diagnoses    Type 2 diabetes mellitus with hyperglycemia, without long-term current use of insulin ([place])    -  Primary   Relevant Orders   Hemoglobin A1c   Microalbumin / creatinine urine ratio   Comprehensive metabolic panel   Vitamin P50   Hyperlipidemia associated with type 2 diabetes mellitus (HCC)       Relevant Orders   Lipid panel   Microalbumin / creatinine urine ratio   Comprehensive metabolic panel   Vitamin D32   Dizzy       Relevant Orders   Vitamin B12   TSH   Paroxysmal supraventricular tachycardia (HCC)       Relevant Orders   Vitamin B12   TSH   EKG 12-Lead (Completed)   Abnormal gait       Relevant Orders   Vitamin B12   Need for influenza vaccination       Relevant Orders   Flu Vaccine QUAD High Dose(Fluad) (Completed)      Follow-up: No follow-ups on file.  Ann Held, DO

## 2020-06-04 NOTE — Assessment & Plan Note (Signed)
hgba1c to be checked, minimize simple carbs. Increase exercise as tolerated. Continue current meds  

## 2020-06-04 NOTE — Assessment & Plan Note (Signed)
Well controlled, no changes to meds. Encouraged heart healthy diet such as the DASH diet and exercise as tolerated.  °

## 2020-06-04 NOTE — Assessment & Plan Note (Signed)
Per rheum 

## 2020-06-05 DIAGNOSIS — Z85820 Personal history of malignant melanoma of skin: Secondary | ICD-10-CM | POA: Diagnosis not present

## 2020-06-05 DIAGNOSIS — D2271 Melanocytic nevi of right lower limb, including hip: Secondary | ICD-10-CM | POA: Diagnosis not present

## 2020-06-05 DIAGNOSIS — Z800 Family history of malignant neoplasm of digestive organs: Secondary | ICD-10-CM | POA: Diagnosis not present

## 2020-06-05 DIAGNOSIS — D225 Melanocytic nevi of trunk: Secondary | ICD-10-CM | POA: Diagnosis not present

## 2020-06-05 DIAGNOSIS — Z86018 Personal history of other benign neoplasm: Secondary | ICD-10-CM | POA: Diagnosis not present

## 2020-06-05 DIAGNOSIS — L578 Other skin changes due to chronic exposure to nonionizing radiation: Secondary | ICD-10-CM | POA: Diagnosis not present

## 2020-06-05 DIAGNOSIS — L821 Other seborrheic keratosis: Secondary | ICD-10-CM | POA: Diagnosis not present

## 2020-06-05 DIAGNOSIS — L570 Actinic keratosis: Secondary | ICD-10-CM | POA: Diagnosis not present

## 2020-06-05 DIAGNOSIS — D0322 Melanoma in situ of left ear and external auricular canal: Secondary | ICD-10-CM | POA: Diagnosis not present

## 2020-06-05 DIAGNOSIS — D485 Neoplasm of uncertain behavior of skin: Secondary | ICD-10-CM | POA: Diagnosis not present

## 2020-06-05 DIAGNOSIS — Z85828 Personal history of other malignant neoplasm of skin: Secondary | ICD-10-CM | POA: Diagnosis not present

## 2020-06-05 DIAGNOSIS — C4339 Malignant melanoma of other parts of face: Secondary | ICD-10-CM | POA: Diagnosis not present

## 2020-06-05 DIAGNOSIS — D2272 Melanocytic nevi of left lower limb, including hip: Secondary | ICD-10-CM | POA: Diagnosis not present

## 2020-06-05 LAB — COMPREHENSIVE METABOLIC PANEL
AG Ratio: 1.5 (calc) (ref 1.0–2.5)
ALT: 18 U/L (ref 9–46)
AST: 19 U/L (ref 10–35)
Albumin: 3.7 g/dL (ref 3.6–5.1)
Alkaline phosphatase (APISO): 70 U/L (ref 35–144)
BUN/Creatinine Ratio: 18 (calc) (ref 6–22)
BUN: 21 mg/dL (ref 7–25)
CO2: 30 mmol/L (ref 20–32)
Calcium: 9.7 mg/dL (ref 8.6–10.3)
Chloride: 104 mmol/L (ref 98–110)
Creat: 1.14 mg/dL — ABNORMAL HIGH (ref 0.70–1.11)
Globulin: 2.4 g/dL (calc) (ref 1.9–3.7)
Glucose, Bld: 98 mg/dL (ref 65–99)
Potassium: 4.6 mmol/L (ref 3.5–5.3)
Sodium: 141 mmol/L (ref 135–146)
Total Bilirubin: 0.6 mg/dL (ref 0.2–1.2)
Total Protein: 6.1 g/dL (ref 6.1–8.1)

## 2020-06-05 LAB — LIPID PANEL
Cholesterol: 98 mg/dL (ref <200)
HDL: 50 mg/dL (ref >=40)
LDL Cholesterol (Calc): 34 mg/dL (calc)
Non-HDL Cholesterol (Calc): 48 mg/dL (calc) (ref <130)
Total CHOL/HDL Ratio: 2 (calc) (ref <5.0)
Triglycerides: 48 mg/dL (ref <150)

## 2020-06-05 LAB — MICROALBUMIN / CREATININE URINE RATIO
Creatinine, Urine: 158 mg/dL (ref 20–320)
Microalb Creat Ratio: 12 mcg/mg creat (ref <30)
Microalb, Ur: 1.9 mg/dL

## 2020-06-05 LAB — HEMOGLOBIN A1C
Hgb A1c MFr Bld: 5.5 % of total Hgb (ref <5.7)
Mean Plasma Glucose: 111 (calc)
eAG (mmol/L): 6.2 (calc)

## 2020-06-05 LAB — VITAMIN B12: Vitamin B-12: 618 pg/mL (ref 200–1100)

## 2020-06-05 LAB — TSH: TSH: 2.12 mIU/L (ref 0.40–4.50)

## 2020-06-05 LAB — VITAMIN D 25 HYDROXY (VIT D DEFICIENCY, FRACTURES): Vit D, 25-Hydroxy: 51 ng/mL (ref 30–100)

## 2020-06-09 ENCOUNTER — Encounter: Payer: Medicare Other | Admitting: Physical Therapy

## 2020-06-09 DIAGNOSIS — Z96642 Presence of left artificial hip joint: Secondary | ICD-10-CM | POA: Diagnosis not present

## 2020-06-09 DIAGNOSIS — M7062 Trochanteric bursitis, left hip: Secondary | ICD-10-CM | POA: Diagnosis not present

## 2020-06-11 ENCOUNTER — Ambulatory Visit: Payer: Medicare Other

## 2020-06-18 ENCOUNTER — Encounter: Payer: Medicare Other | Admitting: Physical Therapy

## 2020-06-19 ENCOUNTER — Telehealth: Payer: Self-pay | Admitting: Family Medicine

## 2020-06-19 ENCOUNTER — Other Ambulatory Visit: Payer: Self-pay

## 2020-06-19 ENCOUNTER — Other Ambulatory Visit (INDEPENDENT_AMBULATORY_CARE_PROVIDER_SITE_OTHER): Payer: Medicare Other

## 2020-06-19 ENCOUNTER — Ambulatory Visit (INDEPENDENT_AMBULATORY_CARE_PROVIDER_SITE_OTHER): Payer: Medicare Other

## 2020-06-19 DIAGNOSIS — D649 Anemia, unspecified: Secondary | ICD-10-CM | POA: Diagnosis not present

## 2020-06-19 DIAGNOSIS — Z23 Encounter for immunization: Secondary | ICD-10-CM

## 2020-06-19 LAB — FECAL OCCULT BLOOD, IMMUNOCHEMICAL: Fecal Occult Bld: POSITIVE — AB

## 2020-06-19 NOTE — Telephone Encounter (Signed)
[place] [facility] lab from [place] called stating she wanted it to speak to assistant in regards to abnormal labs. Called was transferred to [facility]

## 2020-06-19 NOTE — Telephone Encounter (Signed)
Spoke with lab. Pt had positive IFOB. Results in Epic

## 2020-06-22 ENCOUNTER — Other Ambulatory Visit: Payer: Self-pay | Admitting: Family Medicine

## 2020-06-22 DIAGNOSIS — D649 Anemia, unspecified: Secondary | ICD-10-CM

## 2020-06-22 DIAGNOSIS — R195 Other fecal abnormalities: Secondary | ICD-10-CM

## 2020-06-22 NOTE — Telephone Encounter (Signed)
+   I fob----  needs referral to GI for anemia and heme positive stool

## 2020-06-23 NOTE — Telephone Encounter (Signed)
Pt notified of results and referral

## 2020-06-29 ENCOUNTER — Other Ambulatory Visit: Payer: Self-pay | Admitting: Family Medicine

## 2020-07-13 ENCOUNTER — Other Ambulatory Visit: Payer: Self-pay | Admitting: Family Medicine

## 2020-07-15 DIAGNOSIS — C434 Malignant melanoma of scalp and neck: Secondary | ICD-10-CM | POA: Diagnosis not present

## 2020-07-15 DIAGNOSIS — D0322 Melanoma in situ of left ear and external auricular canal: Secondary | ICD-10-CM | POA: Diagnosis not present

## 2020-07-15 DIAGNOSIS — L989 Disorder of the skin and subcutaneous tissue, unspecified: Secondary | ICD-10-CM | POA: Diagnosis not present

## 2020-07-22 DIAGNOSIS — Z23 Encounter for immunization: Secondary | ICD-10-CM | POA: Diagnosis not present

## 2020-08-28 ENCOUNTER — Ambulatory Visit (INDEPENDENT_AMBULATORY_CARE_PROVIDER_SITE_OTHER): Payer: Medicare Other | Admitting: Internal Medicine

## 2020-08-28 ENCOUNTER — Other Ambulatory Visit (INDEPENDENT_AMBULATORY_CARE_PROVIDER_SITE_OTHER): Payer: Medicare Other

## 2020-08-28 ENCOUNTER — Encounter: Payer: Self-pay | Admitting: Internal Medicine

## 2020-08-28 VITALS — BP 120/78 | HR 90 | Ht 72.0 in | Wt 187.0 lb

## 2020-08-28 DIAGNOSIS — M353 Polymyalgia rheumatica: Secondary | ICD-10-CM | POA: Diagnosis not present

## 2020-08-28 DIAGNOSIS — M2550 Pain in unspecified joint: Secondary | ICD-10-CM | POA: Diagnosis not present

## 2020-08-28 DIAGNOSIS — M150 Primary generalized (osteo)arthritis: Secondary | ICD-10-CM | POA: Diagnosis not present

## 2020-08-28 DIAGNOSIS — R195 Other fecal abnormalities: Secondary | ICD-10-CM

## 2020-08-28 DIAGNOSIS — D649 Anemia, unspecified: Secondary | ICD-10-CM

## 2020-08-28 DIAGNOSIS — Z86010 Personal history of colonic polyps: Secondary | ICD-10-CM | POA: Diagnosis not present

## 2020-08-28 DIAGNOSIS — M65341 Trigger finger, right ring finger: Secondary | ICD-10-CM | POA: Diagnosis not present

## 2020-08-28 DIAGNOSIS — Z6825 Body mass index (BMI) 25.0-25.9, adult: Secondary | ICD-10-CM | POA: Diagnosis not present

## 2020-08-28 DIAGNOSIS — M810 Age-related osteoporosis without current pathological fracture: Secondary | ICD-10-CM | POA: Diagnosis not present

## 2020-08-28 DIAGNOSIS — M0609 Rheumatoid arthritis without rheumatoid factor, multiple sites: Secondary | ICD-10-CM | POA: Diagnosis not present

## 2020-08-28 DIAGNOSIS — E663 Overweight: Secondary | ICD-10-CM | POA: Diagnosis not present

## 2020-08-28 DIAGNOSIS — Z79899 Other long term (current) drug therapy: Secondary | ICD-10-CM | POA: Diagnosis not present

## 2020-08-28 DIAGNOSIS — K5901 Slow transit constipation: Secondary | ICD-10-CM | POA: Diagnosis not present

## 2020-08-28 LAB — CBC WITH DIFFERENTIAL/PLATELET
Basophils Absolute: 0 10*3/uL (ref 0.0–0.1)
Basophils Relative: 0.2 % (ref 0.0–3.0)
Eosinophils Absolute: 0 10*3/uL (ref 0.0–0.7)
Eosinophils Relative: 0.2 % (ref 0.0–5.0)
HCT: 28.4 % — ABNORMAL LOW (ref 39.0–52.0)
Hemoglobin: 9.8 g/dL — ABNORMAL LOW (ref 13.0–17.0)
Lymphocytes Relative: 32.1 % (ref 12.0–46.0)
Lymphs Abs: 1.5 10*3/uL (ref 0.7–4.0)
MCHC: 34.4 g/dL (ref 30.0–36.0)
MCV: 105.5 fl — ABNORMAL HIGH (ref 78.0–100.0)
Monocytes Absolute: 2.2 10*3/uL — ABNORMAL HIGH (ref 0.1–1.0)
Monocytes Relative: 47.1 % — ABNORMAL HIGH (ref 3.0–12.0)
Neutro Abs: 0.9 10*3/uL — ABNORMAL LOW (ref 1.4–7.7)
Neutrophils Relative %: 20.4 % — ABNORMAL LOW (ref 43.0–77.0)
Platelets: 100 10*3/uL — ABNORMAL LOW (ref 150.0–400.0)
RBC: 2.69 Mil/uL — ABNORMAL LOW (ref 4.22–5.81)
RDW: 14.4 % (ref 11.5–15.5)
WBC: 4.6 10*3/uL (ref 4.0–10.5)

## 2020-08-28 MED ORDER — SUPREP BOWEL PREP KIT 17.5-3.13-1.6 GM/177ML PO SOLN: 1.0000 | ORAL | 0 refills | Status: DC

## 2020-08-28 NOTE — Patient Instructions (Signed)
If you are age 84 or older, your body mass index should be between 23-30. Your Body mass index is 25.36 kg/m. If this is out of the aforementioned range listed, please consider follow up with your Primary Care Provider.  If you are age 81 or younger, your body mass index should be between 19-25. Your Body mass index is 25.36 kg/m. If this is out of the aformentioned range listed, please consider follow up with your Primary Care Provider.   Your provider has requested that you go to the basement level for lab work before leaving today. Press "B" on the elevator. The lab is located at the first door on the left as you exit the elevator.  You have been scheduled for an endoscopy and colonoscopy. Please follow the written instructions given to you at your visit today. Please pick up your prep supplies at the pharmacy within the next 1-3 days. If you use inhalers (even only as needed), please bring them with you on the day of your procedure.  Due to recent changes in healthcare laws, you may see the results of your imaging and laboratory studies on MyChart before your provider has had a chance to review them.  We understand that in some cases there may be results that are confusing or concerning to you. Not all laboratory results come back in the same time frame and the provider may be waiting for multiple results in order to interpret others.  Please give [place] 48 hours in order for your provider to thoroughly review all the results before contacting the office for clarification of your results.   Thank you for entrusting me with your care and choosing [facility].  Dr Henrene Pastor

## 2020-08-29 LAB — IRON,TIBC AND FERRITIN PANEL
%SAT: 11 % (calc) — ABNORMAL LOW (ref 20–48)
Ferritin: 83 ng/mL (ref 24–380)
Iron: 37 ug/dL — ABNORMAL LOW (ref 50–180)
TIBC: 328 mcg/dL (calc) (ref 250–425)

## 2020-09-02 ENCOUNTER — Encounter: Payer: Self-pay | Admitting: Internal Medicine

## 2020-09-02 DIAGNOSIS — E113292 Type 2 diabetes mellitus with mild nonproliferative diabetic retinopathy without macular edema, left eye: Secondary | ICD-10-CM | POA: Diagnosis not present

## 2020-09-02 DIAGNOSIS — H43813 Vitreous degeneration, bilateral: Secondary | ICD-10-CM | POA: Diagnosis not present

## 2020-09-02 DIAGNOSIS — H40023 Open angle with borderline findings, high risk, bilateral: Secondary | ICD-10-CM | POA: Diagnosis not present

## 2020-09-02 DIAGNOSIS — H35363 Drusen (degenerative) of macula, bilateral: Secondary | ICD-10-CM | POA: Diagnosis not present

## 2020-09-02 DIAGNOSIS — Z79899 Other long term (current) drug therapy: Secondary | ICD-10-CM | POA: Diagnosis not present

## 2020-09-02 NOTE — Progress Notes (Signed)
HISTORY OF PRESENT ILLNESS:  Andrew Ramos is a 86 y.o. male with past medical history as listed below who has been seen in this office previously for constipation and surveillance colonoscopy due to a history of multiple adenomatous colon polyps.  Previous patient of Dr. Verl Blalock before establishing with myself.  He is sent today by his primary care provider regarding anemia and Hemoccult-positive stool.  Patient tells me that he was having recurrent problems with constipation.  However, after changing his diet to include more fruits and fiber as well as water, his bowel habits have improved.  He does take MiraLAX on demand.  He underwent blood work June 2021 and was found to be pancytopenic with hemoglobin 11.4, white blood cell count 3.8, and platelet count 80K.  MCV 107.  He was asked to submit to Hemoccult testing which was performed June 20, 2019 +.  GI evaluation pending.  Patient last underwent complete colonoscopy in 2017.  At that time he was found to have 3 diminutive adenomas and left-sided diverticulosis.  No routine follow-up due to age recommended.  Patient's GI review of systems is otherwise unremarkable.  He has completed his Covid sedation series and booster  REVIEW OF SYSTEMS:  All non-GI ROS negative unless otherwise stated in the HPI except for urinary leakage  Past Medical History:  Diagnosis Date  . Colon polyps    Tubular Adenoma 2005  . Diverticulosis   . Hepatitis    pt told by [facility] has hepatitis antibodies in blood   . Hyperlipidemia   . Hypertension   . Melanoma ([place])   . Nonmelanoma skin cancer   . Osteoarthritis    right hip- none since hip replacement  . Polymyalgia rheumatica ([place])   . Polyposis of colon   . Prostate cancer ([place]) 2007   s/p radiation seed implants  . Prostate cancer ([place])   . Thyroid disease sees dr altzhimer for yearly   nodules    Past Surgical History:  Procedure Laterality Date  . CATARACT EXTRACTION    .  POLYPECTOMY    . TONSILLECTOMY  as child  . TOTAL HIP ARTHROPLASTY  08/27/2008   left  . TOTAL HIP ARTHROPLASTY  06/12/2012   Procedure: TOTAL HIP ARTHROPLASTY ANTERIOR APPROACH;  Surgeon: Mauri Pole, MD;  Location: [facility];  Service: Orthopedics;  Laterality: Right;  . TOTAL KNEE ARTHROPLASTY  12/31/2008   left    Social History Andrew Ramos  reports that he has never smoked. He has never used smokeless tobacco. He reports current alcohol use. He reports that he does not use drugs.  family history includes Coronary artery disease (age of onset: 58) in his mother; Coronary artery disease (age of onset: 2) in his father; Heart disease (age of onset: 44) in his mother; Pancreatic cancer (age of onset: 42) in his brother; Pancreatic cancer (age of onset: 84) in his father.  No Known Allergies     PHYSICAL EXAMINATION: Vital signs: BP 120/78   Pulse 90   Ht 6' (1.829 m)   Wt 187 lb (84.8 kg)   SpO2 99%   BMI 25.36 kg/m   Constitutional: generally well-appearing, no acute distress Psychiatric: alert and oriented x3, cooperative Eyes: extraocular movements intact, anicteric, conjunctiva pink Mouth: oral pharynx moist, no lesions Neck: supple no lymphadenopathy Cardiovascular: heart regular rate and rhythm, no murmur Lungs: clear to auscultation bilaterally Abdomen: soft, nontender, nondistended, no obvious ascites, no peritoneal signs, normal bowel sounds, no organomegaly Rectal:  Deferred until colonoscopy Extremities: no clubbing or cyanosis.  Trace lower extremity edema bilaterally Skin: no lesions on visible extremities Neuro: No focal deficits.  Cranial nerves intact  ASSESSMENT:  1.  Macrocytic anemia (pancytopenia). 2.  History of multiple adenomatous colon polyps.  Last examination 2017 3.  Hemoccult positive stool.  Rule out significant GI mucosal pathology 4.  Functional constipation.  Improved with dietary change and on-demand MiraLAX.  PLAN:  1.  CBC and  iron studies today 2.  Schedule colonoscopy to evaluate Hemoccult-positive stool.  Patient is high risk given his age and comorbidities.The nature of the procedure, as well as the risks, benefits, and alternatives were carefully and thoroughly reviewed with the patient. Ample time for discussion and questions allowed. The patient understood, was satisfied, and agreed to proceed. 3.  Schedule upper endoscopy to evaluate Hemoccult-positive stool.  Patient is high risk as above.The nature of the procedure, as well as the risks, benefits, and alternatives were carefully and thoroughly reviewed with the patient. Ample time for discussion and questions allowed. The patient understood, was satisfied, and agreed to proceed. 4.  Consider hematology referral given pancytopenia and macrocytosis.  ADDENDUM: Today's blood work shows white blood cell count 4.6, hemoglobin 9.8, MCV 105.5, platelet count 100,000. Normal ferritin level of 83.  Iron saturation 11%  A total time of 60 minutes was spent preparing to see the patient, reviewing laboratories, x-rays, and prior endoscopy reports.  Obtaining comprehensive history.  Performing comprehensive physical examination.  Counseling and educating the patient regarding his above listed issues.  Ordering blood work as well as advanced endoscopic procedures.  Finally, documenting clinical information in the health record

## 2020-09-23 ENCOUNTER — Other Ambulatory Visit: Payer: Self-pay | Admitting: Neurology

## 2020-10-06 ENCOUNTER — Encounter: Payer: Medicare Other | Admitting: Internal Medicine

## 2020-10-12 ENCOUNTER — Other Ambulatory Visit: Payer: Self-pay | Admitting: Family Medicine

## 2020-10-12 DIAGNOSIS — F32A Depression, unspecified: Secondary | ICD-10-CM

## 2020-10-20 ENCOUNTER — Other Ambulatory Visit (INDEPENDENT_AMBULATORY_CARE_PROVIDER_SITE_OTHER): Payer: Medicare Other

## 2020-10-20 ENCOUNTER — Other Ambulatory Visit: Payer: Self-pay

## 2020-10-20 ENCOUNTER — Ambulatory Visit (AMBULATORY_SURGERY_CENTER): Payer: Medicare Other | Admitting: Internal Medicine

## 2020-10-20 ENCOUNTER — Encounter: Payer: Self-pay | Admitting: Internal Medicine

## 2020-10-20 VITALS — BP 106/62 | HR 55 | Temp 97.6°F | Resp 11 | Ht 72.0 in | Wt 187.0 lb

## 2020-10-20 DIAGNOSIS — K5730 Diverticulosis of large intestine without perforation or abscess without bleeding: Secondary | ICD-10-CM | POA: Diagnosis not present

## 2020-10-20 DIAGNOSIS — D649 Anemia, unspecified: Secondary | ICD-10-CM

## 2020-10-20 DIAGNOSIS — D122 Benign neoplasm of ascending colon: Secondary | ICD-10-CM | POA: Diagnosis not present

## 2020-10-20 DIAGNOSIS — D509 Iron deficiency anemia, unspecified: Secondary | ICD-10-CM

## 2020-10-20 DIAGNOSIS — Z86010 Personal history of colonic polyps: Secondary | ICD-10-CM

## 2020-10-20 DIAGNOSIS — R195 Other fecal abnormalities: Secondary | ICD-10-CM

## 2020-10-20 DIAGNOSIS — K5901 Slow transit constipation: Secondary | ICD-10-CM

## 2020-10-20 MED ORDER — SODIUM CHLORIDE 0.9 % IV SOLN: 500.0000 mL | Freq: Once | INTRAVENOUS | Status: DC

## 2020-10-20 NOTE — Progress Notes (Signed)
C.W. vital signs. 

## 2020-10-20 NOTE — Op Note (Signed)
[place] Patient Name: Andrew Ramos Procedure Date: 10/20/2020 3:08 PM MRN: [id number] Endoscopist: Docia Chuck. Andrew Ramos , MD Age: 87 Referring MD:  Date of Birth: 11-Dec-1931 Gender: Male Account #: [id number] Procedure:                Colonoscopy with cold snare polypectomy x 2 Indications:              Heme positive stool. Pancytopenia with questionable                            iron deficiency. Macrocytic cytosis. Had problems                            with constipation which have improved after                            increasing water consumption and dietary fiber. Medicines:                Monitored Anesthesia Care Procedure:                Pre-Anesthesia Assessment:                           - Prior to the procedure, a History and Physical                            was performed, and patient medications and                            allergies were reviewed. The patient's tolerance of                            previous anesthesia was also reviewed. The risks                            and benefits of the procedure and the sedation                            options and risks were discussed with the patient.                            All questions were answered, and informed consent                            was obtained. Prior Anticoagulants: The patient has                            taken no previous anticoagulant or antiplatelet                            agents. ASA Grade Assessment: II - A patient with                            mild systemic disease. After reviewing the risks  and benefits, the patient was deemed in                            satisfactory condition to undergo the procedure.                           After obtaining informed consent, the colonoscope                            was passed under direct vision. Throughout the                            procedure, the patient's blood pressure, pulse, and                             oxygen saturations were monitored continuously. The                            Olympus CF-HQ190L (Serial# [id number]) Colonoscope was                            introduced through the anus and advanced to the the                            cecum, identified by appendiceal orifice and                            ileocecal valve. The ileocecal valve, appendiceal                            orifice, and rectum were photographed. The quality                            of the bowel preparation was adequate to identify                            polyps. The colonoscopy was performed without                            difficulty. The patient tolerated the procedure                            well. The bowel preparation used was SUPREP via                            split dose instruction. Scope In: 3:21:50 PM Scope Out: 3:43:49 PM Scope Withdrawal Time: 0 hours 15 minutes 24 seconds  Total Procedure Duration: 0 hours 21 minutes 59 seconds  Findings:                 Two polyps were found in the ascending colon. The                            polyps were 3 to 4 mm in size. These polyps  were                            removed with a cold snare. Resection and retrieval                            were complete.                           Diverticula were found in the left colon and right                            colon.                           The exam was otherwise without abnormality on                            direct and retroflexion views. Complications:            No immediate complications. Estimated blood loss:                            None. Estimated Blood Loss:     Estimated blood loss: none. Impression:               - Two 3 to 4 mm polyps in the ascending colon,                            removed with a cold snare. Resected and retrieved.                           - Diverticulosis in the left colon and in the right                            colon.                           - The  examination was otherwise normal on direct                            and retroflexion views. Recommendation:           - Repeat colonoscopy is not recommended for                            surveillance.                           - Patient has a contact number available for                            emergencies. The signs and symptoms of potential                            delayed complications were discussed with the  patient. Return to normal activities tomorrow.                            Written discharge instructions were provided to the                            patient.                           - Resume previous diet.                           - Continue present medications.                           - Await pathology results.                           -EGD today. Please see report regarding findings                            and final recommendations Andrew Ramos N. Andrew Pastor, MD 10/20/2020 3:49:58 PM This report has been signed electronically.

## 2020-10-20 NOTE — Progress Notes (Signed)
Pt's states no medical or surgical changes since previsit or office visit. 

## 2020-10-20 NOTE — Progress Notes (Unsigned)
PT taken to PACU. Monitors in place. VSS. Report given to RN. 

## 2020-10-20 NOTE — Op Note (Signed)
[facility] Patient Name: Andrew Ramos Procedure Date: 10/20/2020 3:08 PM MRN: [id number] Endoscopist: Docia Chuck. Andrew Ramos , MD Age: 87 Referring MD:  Date of Birth: 1931/12/18 Gender: Male Account #: [id number] Procedure:                Upper GI endoscopy Indications:              Heme positive stool. Macrocytic anemia. Has been on                            iron for 6 to 7 weeks. Medicines:                Monitored Anesthesia Care Procedure:                Pre-Anesthesia Assessment:                           - Prior to the procedure, a History and Physical                            was performed, and patient medications and                            allergies were reviewed. The patient's tolerance of                            previous anesthesia was also reviewed. The risks                            and benefits of the procedure and the sedation                            options and risks were discussed with the patient.                            All questions were answered, and informed consent                            was obtained. Prior Anticoagulants: The patient has                            taken no previous anticoagulant or antiplatelet                            agents. ASA Grade Assessment: II - A patient with                            mild systemic disease. After reviewing the risks                            and benefits, the patient was deemed in                            satisfactory condition to undergo the procedure.  After obtaining informed consent, the endoscope was                            passed under direct vision. Throughout the                            procedure, the patient's blood pressure, pulse, and                            oxygen saturations were monitored continuously. The                            Endoscope was introduced through the mouth, and                            advanced to the second part of  duodenum. The upper                            GI endoscopy was accomplished without difficulty.                            The patient tolerated the procedure well. Scope In: Scope Out: Findings:                 The esophagus was normal.                           The stomach was normal.                           The examined duodenum was normal.                           The cardia and gastric fundus were normal on                            retroflexion. Complications:            No immediate complications. Estimated Blood Loss:     Estimated blood loss: none. Impression:               - Normal esophagus.                           - Normal stomach.                           - Normal examined duodenum.                           - No specimens collected. Recommendation:           - Patient has a contact number available for                            emergencies. The signs and symptoms of potential  delayed complications were discussed with the                            patient. Return to normal activities tomorrow.                            Written discharge instructions were provided to the                            patient.                           - Resume previous diet.                           - Continue present medications.                           - CBC and ferritin level today                           - May need hematology referral if still anemic Andrew Ruz N. Andrew Pastor, MD 10/20/2020 4:04:27 PM This report has been signed electronically.

## 2020-10-20 NOTE — Patient Instructions (Signed)
You will need to go to the lab today to check your blood levels. Continue your present medications.  YOU HAD AN ENDOSCOPIC PROCEDURE TODAY AT [facility]:   Refer to the procedure report that was given to you for any specific questions about what was found during the examination.  If the procedure report does not answer your questions, please call your gastroenterologist to clarify.  If you requested that your care partner not be given the details of your procedure findings, then the procedure report has been included in a sealed envelope for you to review at your convenience later.  YOU SHOULD EXPECT: Some feelings of bloating in the abdomen. Passage of more gas than usual.  Walking can help get rid of the air that was put into your GI tract during the procedure and reduce the bloating. If you had a lower endoscopy (such as a colonoscopy or flexible sigmoidoscopy) you may notice spotting of blood in your stool or on the toilet paper. If you underwent a bowel prep for your procedure, you may not have a normal bowel movement for a few days.  Please Note:  You might notice some irritation and congestion in your nose or some drainage.  This is from the oxygen used during your procedure.  There is no need for concern and it should clear up in a day or so.  SYMPTOMS TO REPORT IMMEDIATELY:   Following lower endoscopy (colonoscopy or flexible sigmoidoscopy):  Excessive amounts of blood in the stool  Significant tenderness or worsening of abdominal pains  Swelling of the abdomen that is new, acute  Fever of 100F or higher   Following upper endoscopy (EGD)  Vomiting of blood or coffee ground material  New chest pain or pain under the shoulder blades  Painful or persistently difficult swallowing  New shortness of breath  Fever of 100F or higher  Black, tarry-looking stools  For urgent or emergent issues, a gastroenterologist can be reached at any hour by calling [phone]. Do  not use MyChart messaging for urgent concerns.    DIET:  We do recommend a small meal at first, but then you may proceed to your regular diet.  Drink plenty of fluids but you should avoid alcoholic beverages for 24 hours.  ACTIVITY:  You should plan to take it easy for the rest of today and you should NOT DRIVE or use heavy machinery until tomorrow (because of the sedation medicines used during the test).    FOLLOW UP: Our staff will call the number listed on your records 48-72 hours following your procedure to check on you and address any questions or concerns that you may have regarding the information given to you following your procedure. If we do not reach you, we will leave a message.  We will attempt to reach you two times.  During this call, we will ask if you have developed any symptoms of COVID 19. If you develop any symptoms (ie: fever, flu-like symptoms, shortness of breath, cough etc.) before then, please call [phone].  If you test positive for Covid 19 in the 2 weeks post procedure, please call and report this information to [place].    If any biopsies were taken you will be contacted by phone or by letter within the next 1-3 weeks.  Please call us at [phone] if you have not heard about the biopsies in 3 weeks.    SIGNATURES/CONFIDENTIALITY: You and/or your care partner have signed paperwork which will be  entered into your electronic medical record.  These signatures attest to the fact that that the information above on your After Visit Summary has been reviewed and is understood.  Full responsibility of the confidentiality of this discharge information lies with you and/or your care-partner. 

## 2020-10-21 ENCOUNTER — Other Ambulatory Visit: Payer: Self-pay | Admitting: Family Medicine

## 2020-10-21 ENCOUNTER — Other Ambulatory Visit: Payer: Self-pay

## 2020-10-21 DIAGNOSIS — D649 Anemia, unspecified: Secondary | ICD-10-CM

## 2020-10-21 LAB — CBC WITH DIFFERENTIAL/PLATELET
Basophils Absolute: 0 10*3/uL (ref 0.0–0.1)
Basophils Relative: 0.2 % (ref 0.0–3.0)
Eosinophils Absolute: 0 10*3/uL (ref 0.0–0.7)
Eosinophils Relative: 0.2 % (ref 0.0–5.0)
HCT: 26.4 % — ABNORMAL LOW (ref 39.0–52.0)
Hemoglobin: 9.1 g/dL — ABNORMAL LOW (ref 13.0–17.0)
Lymphocytes Relative: 39.7 % (ref 12.0–46.0)
Lymphs Abs: 1.2 10*3/uL (ref 0.7–4.0)
MCHC: 34.3 g/dL (ref 30.0–36.0)
MCV: 104 fl — ABNORMAL HIGH (ref 78.0–100.0)
Monocytes Absolute: 1.3 10*3/uL — ABNORMAL HIGH (ref 0.1–1.0)
Monocytes Relative: 45.2 % — ABNORMAL HIGH (ref 3.0–12.0)
Neutro Abs: 0.4 10*3/uL — ABNORMAL LOW (ref 1.4–7.7)
Neutrophils Relative %: 14.5 % — ABNORMAL LOW (ref 43.0–77.0)
Platelets: 81 10*3/uL — ABNORMAL LOW (ref 150.0–400.0)
RBC: 2.54 Mil/uL — ABNORMAL LOW (ref 4.22–5.81)
RDW: 15.7 % — ABNORMAL HIGH (ref 11.5–15.5)
WBC: 3 10*3/uL — ABNORMAL LOW (ref 4.0–10.5)

## 2020-10-21 LAB — FERRITIN: Ferritin: 78.2 ng/mL (ref 22.0–322.0)

## 2020-10-22 ENCOUNTER — Telehealth: Payer: Self-pay | Admitting: *Deleted

## 2020-10-22 NOTE — Telephone Encounter (Signed)
  Follow up Call-  Call back number 10/20/2020  Post procedure Call Back phone  # [phone]  Permission to leave phone message Yes  Some recent data might be hidden     Patient questions:  Do you have a fever, pain , or abdominal swelling? No. Pain Score  0 *  Have you tolerated food without any problems? Yes.    Have you been able to return to your normal activities? Yes.    Do you have any questions about your discharge instructions: Diet   No. Medications  No. Follow up visit  No.  Do you have questions or concerns about your Care? No.  Actions: * If pain score is 4 or above: No action needed, pain <4.  1. Have you developed a fever since your procedure? no  2.   Have you had an respiratory symptoms (SOB or cough) since your procedure? no  3.   Have you tested positive for COVID 19 since your procedure no  4.   Have you had any family members/close contacts diagnosed with the COVID 19 since your procedure?  no   If yes to any of these questions please route to Joylene John, RN and Joella Prince, RN

## 2020-10-26 ENCOUNTER — Encounter: Payer: Self-pay | Admitting: Hematology & Oncology

## 2020-10-26 ENCOUNTER — Inpatient Hospital Stay (HOSPITAL_BASED_OUTPATIENT_CLINIC_OR_DEPARTMENT_OTHER): Payer: Medicare Other | Admitting: Hematology & Oncology

## 2020-10-26 ENCOUNTER — Inpatient Hospital Stay: Payer: Medicare Other | Attending: Hematology & Oncology

## 2020-10-26 ENCOUNTER — Telehealth: Payer: Self-pay | Admitting: *Deleted

## 2020-10-26 ENCOUNTER — Other Ambulatory Visit: Payer: Self-pay

## 2020-10-26 ENCOUNTER — Telehealth: Payer: Self-pay

## 2020-10-26 VITALS — BP 138/66 | HR 87 | Temp 97.6°F | Resp 19 | Wt 188.0 lb

## 2020-10-26 DIAGNOSIS — D72821 Monocytosis (symptomatic): Secondary | ICD-10-CM

## 2020-10-26 DIAGNOSIS — D4620 Refractory anemia with excess of blasts, unspecified: Secondary | ICD-10-CM

## 2020-10-26 DIAGNOSIS — Z85820 Personal history of malignant melanoma of skin: Secondary | ICD-10-CM | POA: Diagnosis not present

## 2020-10-26 DIAGNOSIS — R5383 Other fatigue: Secondary | ICD-10-CM | POA: Insufficient documentation

## 2020-10-26 DIAGNOSIS — R5382 Chronic fatigue, unspecified: Secondary | ICD-10-CM

## 2020-10-26 DIAGNOSIS — Z8546 Personal history of malignant neoplasm of prostate: Secondary | ICD-10-CM | POA: Diagnosis not present

## 2020-10-26 DIAGNOSIS — Z86010 Personal history of colonic polyps: Secondary | ICD-10-CM | POA: Insufficient documentation

## 2020-10-26 DIAGNOSIS — Z79899 Other long term (current) drug therapy: Secondary | ICD-10-CM | POA: Insufficient documentation

## 2020-10-26 HISTORY — DX: Refractory anemia with excess of blasts, unspecified: D46.20

## 2020-10-26 LAB — CMP (CANCER CENTER ONLY)
ALT: 14 U/L (ref 0–44)
AST: 18 U/L (ref 15–41)
Albumin: 4.1 g/dL (ref 3.5–5.0)
Alkaline Phosphatase: 67 U/L (ref 38–126)
Anion gap: 6 (ref 5–15)
BUN: 27 mg/dL — ABNORMAL HIGH (ref 8–23)
CO2: 34 mmol/L — ABNORMAL HIGH (ref 22–32)
Calcium: 10.7 mg/dL — ABNORMAL HIGH (ref 8.9–10.3)
Chloride: 100 mmol/L (ref 98–111)
Creatinine: 1.33 mg/dL — ABNORMAL HIGH (ref 0.61–1.24)
GFR, Estimated: 52 mL/min — ABNORMAL LOW (ref >60)
Glucose, Bld: 129 mg/dL — ABNORMAL HIGH (ref 70–99)
Potassium: 4 mmol/L (ref 3.5–5.1)
Sodium: 140 mmol/L (ref 135–145)
Total Bilirubin: 0.4 mg/dL (ref 0.3–1.2)
Total Protein: 6.5 g/dL (ref 6.5–8.1)

## 2020-10-26 LAB — SAVE SMEAR(SSMR), FOR PROVIDER SLIDE REVIEW

## 2020-10-26 LAB — RETICULOCYTES
Immature Retic Fract: 13.3 % (ref 2.3–15.9)
RBC.: 2.62 MIL/uL — ABNORMAL LOW (ref 4.22–5.81)
Retic Count, Absolute: 48.2 10*3/uL (ref 19.0–186.0)
Retic Ct Pct: 1.8 % (ref 0.4–3.1)

## 2020-10-26 LAB — CBC WITH DIFFERENTIAL (CANCER CENTER ONLY)
Abs Immature Granulocytes: 0.01 10*3/uL (ref 0.00–0.07)
Basophils Absolute: 0 10*3/uL (ref 0.0–0.1)
Basophils Relative: 0 %
Eosinophils Absolute: 0 10*3/uL (ref 0.0–0.5)
Eosinophils Relative: 0 %
HCT: 27.7 % — ABNORMAL LOW (ref 39.0–52.0)
Hemoglobin: 9.1 g/dL — ABNORMAL LOW (ref 13.0–17.0)
Immature Granulocytes: 0 %
Lymphocytes Relative: 46 %
Lymphs Abs: 1.9 10*3/uL (ref 0.7–4.0)
MCH: 35 pg — ABNORMAL HIGH (ref 26.0–34.0)
MCHC: 32.9 g/dL (ref 30.0–36.0)
MCV: 106.5 fL — ABNORMAL HIGH (ref 80.0–100.0)
Monocytes Absolute: 1.8 10*3/uL — ABNORMAL HIGH (ref 0.1–1.0)
Monocytes Relative: 44 %
Neutro Abs: 0.4 10*3/uL — CL (ref 1.7–7.7)
Neutrophils Relative %: 10 %
Platelet Count: 94 10*3/uL — ABNORMAL LOW (ref 150–400)
RBC: 2.6 MIL/uL — ABNORMAL LOW (ref 4.22–5.81)
RDW: 15.2 % (ref 11.5–15.5)
WBC Count: 4.2 10*3/uL (ref 4.0–10.5)
nRBC: 0 % (ref 0.0–0.2)

## 2020-10-26 LAB — LACTATE DEHYDROGENASE: LDH: 202 U/L — ABNORMAL HIGH (ref 98–192)

## 2020-10-26 NOTE — Telephone Encounter (Signed)
appts made and printed for pt, pt to get a call regarding bx once approved   Andrew Ramos

## 2020-10-26 NOTE — Telephone Encounter (Signed)
Critical ANC .4 reported by Richardson Landry in lab.  Dr Marin Olp in room with patient. Aware of result

## 2020-10-26 NOTE — Progress Notes (Signed)
Referral MD  Reason for Referral: Anemia and monocytosis  Chief Complaint  Patient presents with  . Follow-up  : I am tired.  HPI: Andrew Ramos is actually fairly well-known to me.  We saw him about 2 years ago.  He is a very nice 87 year old white male.  He served in the [employer].  He was in the [employer].  He actually was in [place].  He had no exposures as far as he knows to napalm or agent orange.  He does have a past history of melanoma and I think prostate cancer.  There is a past history of "hepatitis."  He has been on methotrexate in the past for polymyalgia.  He is off methotrexate.  He is taking Plaquenil.  He apparently has some blood per rectum.  He was seen by Dr. Scarlette Shorts.  A colonoscopy was done last week.  There were some polyps.  There is no malignancy.  It was recommended that he come back to the [place] for an evaluation for the anemia.  Dr. Henrene Pastor did give him some iron.  He has had no fever.  He has had no cough or shortness of breath.  He has lost weight.  He has probably lost about 40 pounds over the past year.  He is not a vegetarian.  His wife is not doing well.  She has sarcoidosis.  He does not smoke.  He has a rare alcoholic drink.  We saw him a couple years ago, we actually did do next generation studies on his blood.  Not surprisingly, he was found to have several molecular abnormalities.  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  He, I suspect, has underlying myelodysplasia.  He has quite a few monocytes.  I suspect this is a indicator for him.  He will clearly need a bone marrow biopsy.  He has had no rashes.  He has had no swelling of the joints.  He has had some ecchymoses.  Currently, his performance status is ECOG 2.    Past Medical History:  Diagnosis Date  . Colon polyps    Tubular Adenoma 2005  . Diverticulosis   . Hepatitis    pt told by red cross  has hepatitis antibodies in blood   . Hyperlipidemia   . Hypertension   . Low grade myelodysplastic syndrome lesions ([place]) 10/26/2020  . Melanoma ([place])   . Nonmelanoma skin cancer   . Osteoarthritis    right hip- none since hip replacement  . Polymyalgia rheumatica ([place])   . Polyposis of colon   . Prostate cancer ([place]) 2007   s/p radiation seed implants  . Prostate cancer ([place])   . Thyroid disease sees dr altzhimer for yearly   nodules  :  Past Surgical History:  Procedure Laterality Date  . CATARACT EXTRACTION    . POLYPECTOMY    . TONSILLECTOMY  as child  . TOTAL HIP ARTHROPLASTY  08/27/2008   left  . TOTAL HIP ARTHROPLASTY  06/12/2012   Procedure: TOTAL HIP ARTHROPLASTY ANTERIOR APPROACH;  Surgeon: Mauri Pole, MD;  Location: [facility];  Service: Orthopedics;  Laterality: Right;  . TOTAL KNEE ARTHROPLASTY  12/31/2008   left  :   Current Outpatient Medications:  .  aspirin 81 MG tablet, Take 1 tablet (81 mg total) by mouth daily., Disp: 30 tablet, Rfl:  .  atorvastatin (LIPITOR) 40 MG tablet, TAKE 1  TABLET DAILY, Disp: 90 tablet, Rfl: 3 .  Calcium Carbonate-Vitamin D (CALCIUM 600 + D PO), Take 1 tablet by mouth every morning. , Disp: , Rfl:  .  Cholecalciferol (VITAMIN D3) 1000 UNITS tablet, Take 1,000 Units by mouth every morning., Disp: , Rfl:  .  donepezil (ARICEPT) 10 MG tablet, TAKE 1 TABLET AT BEDTIME, Disp: 90 tablet, Rfl: 0 .  fluticasone (FLONASE) 50 MCG/ACT nasal spray, Place 2 sprays into both nostrils daily., Disp: 16 g, Rfl: 6 .  hydroxychloroquine (PLAQUENIL) 200 MG tablet, Take 200 mg by mouth 2 (two) times daily., Disp: , Rfl:  .  lisinopril-hydrochlorothiazide (ZESTORETIC) 10-12.5 MG tablet, TAKE 1 TABLET DAILY, Disp: 90 tablet, Rfl: 3 .  loratadine (CLARITIN) 10 MG tablet, TAKE 1 TABLET DAILY, Disp: 90 tablet, Rfl: 3 .  Multiple Vitamins-Minerals (CENTRUM SILVER ULTRA MENS) TABS, Take 1 tablet by mouth every evening., Disp: , Rfl:  .  polyethylene glycol  powder (GLYCOLAX/MIRALAX) powder, Take 17 g by mouth daily as needed for moderate constipation. 1 scoop daily, Disp: , Rfl:  .  terazosin (HYTRIN) 1 MG capsule, TAKE 1 CAPSULE AT BEDTIME, Disp: 90 capsule, Rfl: 3 .  venlafaxine XR (EFFEXOR-XR) 75 MG 24 hr capsule, Take 1 capsule (75 mg total) by mouth daily with breakfast., Disp: 90 capsule, Rfl: 3:  :  No Known Allergies:  Family History  Problem Relation Age of Onset  . Coronary artery disease Mother 1  . Heart disease Mother 33       MI  . Coronary artery disease Father 52  . Pancreatic cancer Father 18       deceased 59  . Pancreatic cancer Brother 72       deceased 40; smoker  . Colon cancer Neg Hx   . Colon polyps Neg Hx   . Esophageal cancer Neg Hx   . Kidney disease Neg Hx   . Diabetes Neg Hx   . Gallbladder disease Neg Hx   :  Social History   Socioeconomic History  . Marital status: Married    Spouse name: Not on file  . Number of children: 2  . Years of education: Not on file  . Highest education level: Not on file  Occupational History  . Occupation: retired Lobbyist: RETIRED  Tobacco Use  . Smoking status: Never Smoker  . Smokeless tobacco: Never Used  Vaping Use  . Vaping Use: Never used  Substance and Sexual Activity  . Alcohol use: Yes    Alcohol/week: 0.0 standard drinks    Comment: occasional  . Drug use: No  . Sexual activity: Not Currently    Partners: Female  Other Topics Concern  . Not on file  Social History Narrative   Exercise--no   Right handed    Nature conservation officer   Social Determinants of Health   Financial Resource Strain: Not on file  Food Insecurity: Not on file  Transportation Needs: Not on file  Physical Activity: Not on file  Stress: Not on file  Social Connections: Not on file  Intimate Partner Violence: Not on file  :  Review of Systems  Constitutional: Positive for malaise/fatigue and weight loss.  HENT: Negative.   Eyes: Negative.  Negative for blurred  vision.  Respiratory: Negative.   Cardiovascular: Negative.   Gastrointestinal: Positive for blood in stool.  Genitourinary: Negative.   Musculoskeletal: Negative.   Skin: Negative.   Neurological: Negative.   Endo/Heme/Allergies: Negative.   Psychiatric/Behavioral: Negative.  Exam:    This is an elderly white male in no obvious distress.  Vital signs show temperature of 97.6.  Pulse 87.  Blood pressure 138/66.  Weight is 188 pounds.  Head and neck exam shows no scleral icterus.  He has some conjunctival pallor.  He has no oral lesions.  There is no adenopathy in the neck.  Thyroid is nonpalpable.  Lungs are clear bilaterally.  Cardiac exam regular rate and rhythm with 1/6 systolic ejection murmur.  Abdomen is soft.  He has good bowel sounds.  There is no fluid wave.  There is no palpable abdominal mass.  He has no palpable liver or spleen tip.  Back exam shows no tenderness over the spine, ribs or hips.  Extremities shows no clubbing, cyanosis or edema.  Neurological exam shows no focal neurological deficits.  Skin exam shows scattered ecchymoses.      @IPVITALS @   Recent Labs    10/26/20 1047  WBC 4.2  HGB 9.1*  HCT 27.7*  PLT 94*   Recent Labs    10/26/20 1047  NA 140  K 4.0  CL 100  CO2 34*  GLUCOSE 129*  BUN 27*  CREATININE 1.33*  CALCIUM 10.7*    Blood smear review: Normochromic and normocytic population of red blood cells.  He may have some macrocytic red blood cells.  I see no nucleated red blood cells.  There are no target cells or teardrop cells.  I see no nucleated red blood cells.  He has no rouleaux formation.  White cells show an increase in monocytes.  They appear mature.  I do not see any immature myeloid or lymphoid cells.  Has no hypersegmented polys.  Platelets are decreased in number.  Platelets are small.  Pathology: None    Assessment and Plan: Andrew Ramos is a very nice 87 year old white male.  Again, he served our country probably.  He has  anemia.  I am sure that his erythropoietin level is probably going to be on the low side.  He has monocytosis.  I have to believe that this is a indicator that he does have an underlying myelodysplastic process.  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  We will have to get a bone marrow biopsy on him.  We will try to set this up for next week.  It will be interesting to see what the cytogenetics show.  I think this will be very important.  We know he has the genetic mutations on the next generation sequencing studies.  We will have to see what his erythropoietin level is.  Hopefully, we can use this to help get his hemoglobin higher.  He does not have the best performance status.  As such, we will have to be cautious as to how we proceed and with how aggressive we can be with our interventions.  I spent about an hour with him.  Again he is a true American hero for serving our country.  I will plan to see him back in about 3 weeks at which point I should have all the results back from our bone marrow test.

## 2020-10-27 LAB — ERYTHROPOIETIN: Erythropoietin: 76 m[IU]/mL — ABNORMAL HIGH (ref 2.6–18.5)

## 2020-10-27 LAB — IRON AND TIBC
Iron: 93 ug/dL (ref 42–163)
Saturation Ratios: 28 % (ref 20–55)
TIBC: 338 ug/dL (ref 202–409)
UIBC: 244 ug/dL (ref 117–376)

## 2020-10-27 LAB — FERRITIN: Ferritin: 87 ng/mL (ref 24–336)

## 2020-10-29 ENCOUNTER — Encounter: Payer: Self-pay | Admitting: Internal Medicine

## 2020-11-05 ENCOUNTER — Other Ambulatory Visit (HOSPITAL_COMMUNITY): Payer: Medicare Other

## 2020-11-05 ENCOUNTER — Other Ambulatory Visit: Payer: Self-pay | Admitting: Student

## 2020-11-06 ENCOUNTER — Ambulatory Visit (HOSPITAL_COMMUNITY)
Admission: RE | Admit: 2020-11-06 | Discharge: 2020-11-06 | Disposition: A | Discharge status: Home / Self Care | Payer: Medicare Other | Source: Ambulatory Visit | Attending: Hematology & Oncology | Admitting: Hematology & Oncology

## 2020-11-06 ENCOUNTER — Other Ambulatory Visit: Payer: Self-pay

## 2020-11-06 ENCOUNTER — Encounter (HOSPITAL_COMMUNITY): Payer: Self-pay

## 2020-11-06 DIAGNOSIS — D539 Nutritional anemia, unspecified: Secondary | ICD-10-CM | POA: Insufficient documentation

## 2020-11-06 DIAGNOSIS — D72821 Monocytosis (symptomatic): Secondary | ICD-10-CM | POA: Diagnosis not present

## 2020-11-06 DIAGNOSIS — D471 Chronic myeloproliferative disease: Secondary | ICD-10-CM | POA: Diagnosis not present

## 2020-11-06 DIAGNOSIS — D72819 Decreased white blood cell count, unspecified: Secondary | ICD-10-CM | POA: Diagnosis not present

## 2020-11-06 DIAGNOSIS — D696 Thrombocytopenia, unspecified: Secondary | ICD-10-CM | POA: Diagnosis not present

## 2020-11-06 DIAGNOSIS — D649 Anemia, unspecified: Secondary | ICD-10-CM | POA: Diagnosis not present

## 2020-11-06 LAB — CBC
HCT: 28.7 % — ABNORMAL LOW (ref 39.0–52.0)
Hemoglobin: 9.4 g/dL — ABNORMAL LOW (ref 13.0–17.0)
MCH: 35.5 pg — ABNORMAL HIGH (ref 26.0–34.0)
MCHC: 32.8 g/dL (ref 30.0–36.0)
MCV: 108.3 fL — ABNORMAL HIGH (ref 80.0–100.0)
Platelets: 96 10*3/uL — ABNORMAL LOW (ref 150–400)
RBC: 2.65 MIL/uL — ABNORMAL LOW (ref 4.22–5.81)
RDW: 15.6 % — ABNORMAL HIGH (ref 11.5–15.5)
WBC: 3.9 10*3/uL — ABNORMAL LOW (ref 4.0–10.5)
nRBC: 0 % (ref 0.0–0.2)

## 2020-11-06 LAB — PROTIME-INR
INR: 1.1 (ref 0.8–1.2)
Prothrombin Time: 13.8 seconds (ref 11.4–15.2)

## 2020-11-06 MED ORDER — FENTANYL CITRATE (PF) 100 MCG/2ML IJ SOLN
INTRAMUSCULAR | Status: AC
  Filled 2020-11-06: qty 2

## 2020-11-06 MED ORDER — SODIUM CHLORIDE 0.9 % IV SOLN: INTRAVENOUS | Status: DC

## 2020-11-06 MED ORDER — MIDAZOLAM HCL 2 MG/2ML IJ SOLN
INTRAMUSCULAR | Status: AC
  Filled 2020-11-06: qty 4

## 2020-11-06 MED ORDER — FENTANYL CITRATE (PF) 100 MCG/2ML IJ SOLN
INTRAMUSCULAR | Status: AC | PRN
Start: 2020-11-06 — End: 2020-11-06
  Administered 2020-11-06 (×2): 25 ug via INTRAVENOUS

## 2020-11-06 MED ORDER — MIDAZOLAM HCL 2 MG/2ML IJ SOLN
INTRAMUSCULAR | Status: AC | PRN
  Administered 2020-11-06 (×2): 0.5 mg via INTRAVENOUS

## 2020-11-06 MED ORDER — LIDOCAINE HCL (PF) 1 % IJ SOLN
INTRAMUSCULAR | Status: AC | PRN
  Administered 2020-11-06: 10 mL

## 2020-11-06 NOTE — Discharge Instructions (Signed)
Please call Interventional Radiology clinic [phone] with any questions or concerns.  You may remove your dressing and shower tomorrow.   Bone Marrow Aspiration and Bone Marrow Biopsy, Adult, Care After This sheet gives you information about how to care for yourself after your procedure. Your health care provider may also give you more specific instructions. If you have problems or questions, contact your health care provider. What can I expect after the procedure? After the procedure, it is common to have:  Mild pain and tenderness.  Swelling.  Bruising. Follow these instructions at home: Puncture site care  Follow instructions from your health care provider about how to take care of the puncture site. Make sure you: ? Wash your hands with soap and water before and after you change your bandage (dressing). If soap and water are not available, use hand sanitizer. ? Change your dressing as told by your health care provider.  Check your puncture site every day for signs of infection. Check for: ? More redness, swelling, or pain. ? Fluid or blood. ? Warmth. ? Pus or a bad smell.   Activity  Return to your normal activities as told by your health care provider. Ask your health care provider what activities are safe for you.  Do not lift anything that is heavier than 10 lb (4.5 kg), or the limit that you are told, until your health care provider says that it is safe.  Do not drive for 24 hours if you were given a sedative during your procedure. General instructions  Take over-the-counter and prescription medicines only as told by your health care provider.  Do not take baths, swim, or use a hot tub until your health care provider approves. Ask your health care provider if you may take showers. You may only be allowed to take sponge baths.  If directed, put ice on the affected area. To do this: ? Put ice in a plastic bag. ? Place a towel between your skin and the bag. ? Leave  the ice on for 20 minutes, 2-3 times a day.  Keep all follow-up visits as told by your health care provider. This is important.   Contact a health care provider if:  Your pain is not controlled with medicine.  You have a fever.  You have more redness, swelling, or pain around the puncture site.  You have fluid or blood coming from the puncture site.  Your puncture site feels warm to the touch.  You have pus or a bad smell coming from the puncture site. Summary  After the procedure, it is common to have mild pain, tenderness, swelling, and bruising.  Follow instructions from your health care provider about how to take care of the puncture site and what activities are safe for you.  Take over-the-counter and prescription medicines only as told by your health care provider.  Contact a health care provider if you have any signs of infection, such as fluid or blood coming from the puncture site. This information is not intended to replace advice given to you by your health care provider. Make sure you discuss any questions you have with your health care provider. Document Revised: 01/22/2019 Document Reviewed: 01/22/2019 Elsevier Patient Education  2021 Elsevier Inc.   Moderate Conscious Sedation, Adult, Care After This sheet gives you information about how to care for yourself after your procedure. Your health care provider may also give you more specific instructions. If you have problems or questions, contact your health care provider. What   can I expect after the procedure? After the procedure, it is common to have:  Sleepiness for several hours.  Impaired judgment for several hours.  Difficulty with balance.  Vomiting if you eat too soon. Follow these instructions at home: For the time period you were told by your health care provider:  Rest.  Do not participate in activities where you could fall or become injured.  Do not drive or use machinery.  Do not drink  alcohol.  Do not take sleeping pills or medicines that cause drowsiness.  Do not make important decisions or sign legal documents.  Do not take care of children on your own.      Eating and drinking  Follow the diet recommended by your health care provider.  Drink enough fluid to keep your urine pale yellow.  If you vomit: ? Drink water, juice, or soup when you can drink without vomiting. ? Make sure you have little or no nausea before eating solid foods.   General instructions  Take over-the-counter and prescription medicines only as told by your health care provider.  Have a responsible adult stay with you for the time you are told. It is important to have someone help care for you until you are awake and alert.  Do not smoke.  Keep all follow-up visits as told by your health care provider. This is important. Contact a health care provider if:  You are still sleepy or having trouble with balance after 24 hours.  You feel light-headed.  You keep feeling nauseous or you keep vomiting.  You develop a rash.  You have a fever.  You have redness or swelling around the IV site. Get help right away if:  You have trouble breathing.  You have new-onset confusion at home. Summary  After the procedure, it is common to feel sleepy, have impaired judgment, or feel nauseous if you eat too soon.  Rest after you get home. Know the things you should not do after the procedure.  Follow the diet recommended by your health care provider and drink enough fluid to keep your urine pale yellow.  Get help right away if you have trouble breathing or new-onset confusion at home. This information is not intended to replace advice given to you by your health care provider. Make sure you discuss any questions you have with your health care provider. Document Revised: 01/03/2020 Document Reviewed: 08/01/2019 Elsevier Patient Education  2021 Elsevier Inc.  

## 2020-11-06 NOTE — Consult Note (Signed)
Chief Complaint: Patient was seen in consultation today for CT guided bone marrow biopsy  Referring Physician(s): Andrew Ramos  Supervising Physician: Andrew Ramos  Patient Status: [facility] - Out-pt  History of Present Illness: Andrew Ramos is an 87 y.o. male past medical history of diverticulosis, hyperlipidemia, hypertension, melanoma, osteoarthritis, PMR, prostate cancer, and now with worsening anemia/monocytosis of uncertain etiology.  He presents today for CT-guided bone marrow biopsy to rule out underlying myelodysplastic process or CML.   Past Medical History:  Diagnosis Date  . Colon polyps    Tubular Adenoma 2005  . Diverticulosis   . Hepatitis    pt told by red cross has hepatitis antibodies in blood   . Hyperlipidemia   . Hypertension   . Low grade myelodysplastic syndrome lesions ([place]) 10/26/2020  . Melanoma ([place])   . Nonmelanoma skin cancer   . Osteoarthritis    right hip- none since hip replacement  . Polymyalgia rheumatica ([place])   . Polyposis of colon   . Prostate cancer ([place]) 2007   s/p radiation seed implants  . Prostate cancer ([place])   . Thyroid disease sees dr altzhimer for yearly   nodules    Past Surgical History:  Procedure Laterality Date  . CATARACT EXTRACTION    . POLYPECTOMY    . TONSILLECTOMY  as child  . TOTAL HIP ARTHROPLASTY  08/27/2008   left  . TOTAL HIP ARTHROPLASTY  06/12/2012   Procedure: TOTAL HIP ARTHROPLASTY ANTERIOR APPROACH;  Surgeon: Andrew Pole, MD;  Location: [facility];  Service: Orthopedics;  Laterality: Right;  . TOTAL KNEE ARTHROPLASTY  12/31/2008   left    Allergies: Patient has no known allergies.  Medications: Prior to Admission medications   Medication Sig Start Date End Date Taking? Authorizing Provider  aspirin 81 MG tablet Take 1 tablet (81 mg total) by mouth daily. 10/06/14  Yes Andrew Schanz R, DO  atorvastatin (LIPITOR) 40 MG tablet TAKE 1 TABLET DAILY 06/29/20  Yes Andrew Schanz R, DO   Calcium Carbonate-Vitamin D (CALCIUM 600 + D PO) Take 1 tablet by mouth every morning.    Yes [provider]  Cholecalciferol (VITAMIN D3) 1000 UNITS tablet Take 1,000 Units by mouth every morning.   Yes [provider]  donepezil (ARICEPT) 10 MG tablet TAKE 1 TABLET AT BEDTIME 09/23/20  Yes Andrew Sprang, MD  fluticasone [facility]) 50 MCG/ACT nasal spray Place 2 sprays into both nostrils daily. 07/08/19  Yes Andrew Held, DO  hydroxychloroquine (PLAQUENIL) 200 MG tablet Take 200 mg by mouth 2 (two) times daily. 03/09/20  Yes [provider]  lisinopril-hydrochlorothiazide (ZESTORETIC) 10-12.5 MG tablet TAKE 1 TABLET DAILY 02/04/20  Yes Andrew Herter, Kendrick Fries R, DO  loratadine (CLARITIN) 10 MG tablet TAKE 1 TABLET DAILY 02/04/20  Yes Andrew Schanz R, DO  Multiple Vitamins-Minerals (CENTRUM SILVER ULTRA MENS) TABS Take 1 tablet by mouth every evening.   Yes [provider]  polyethylene glycol powder (GLYCOLAX/MIRALAX) powder Take 17 g by mouth daily as needed for moderate constipation. 1 scoop daily   Yes [provider]  terazosin (HYTRIN) 1 MG capsule TAKE 1 CAPSULE AT BEDTIME 07/14/20  Yes Andrew Held, DO  venlafaxine XR (EFFEXOR-XR) 75 MG 24 hr capsule Take 1 capsule (75 mg total) by mouth daily with breakfast. 10/12/20  Yes Andrew Held, DO     Family History  Problem Relation Age of Onset  . Coronary artery disease Mother 51  .  Heart disease Mother 42       MI  . Coronary artery disease Father 24  . Pancreatic cancer Father 65       deceased 59  . Pancreatic cancer Brother 75       deceased 81; smoker  . Colon cancer Neg Hx   . Colon polyps Neg Hx   . Esophageal cancer Neg Hx   . Kidney disease Neg Hx   . Diabetes Neg Hx   . Gallbladder disease Neg Hx     Social History   Socioeconomic History  . Marital status: Married    Spouse name: Not on file  . Number of children: 2  . Years of education: Not on  file  . Highest education level: Not on file  Occupational History  . Occupation: retired Lobbyist: RETIRED  Tobacco Use  . Smoking status: Never Smoker  . Smokeless tobacco: Never Used  Vaping Use  . Vaping Use: Never used  Substance and Sexual Activity  . Alcohol use: Yes    Alcohol/week: 0.0 standard drinks    Comment: occasional  . Drug use: No  . Sexual activity: Not Currently    Partners: Female  Other Topics Concern  . Not on file  Social History Narrative   Exercise--no   Right handed    Nature conservation officer   Social Determinants of Health   Financial Resource Strain: Not on file  Food Insecurity: Not on file  Transportation Needs: Not on file  Physical Activity: Not on file  Stress: Not on file  Social Connections: Not on file       Review of Systems denies fever, headache, chest pain, dyspnea, cough, abdominal/back pain, nausea, vomiting or bleeding.  He is hard of hearing.  Vital Signs: Pressure 147/78, temp 98, heart rate 93, respirations 16, O2 sat 97% room air    Physical Exam awake, alert.  Chest clear to auscultation bilaterally.  Heart with regular rate and rhythm.  Abdomen soft, positive bowel sounds, nontender.  Trace-1+ pretibial  edema bilaterally  Imaging: No results found.  Labs:  CBC: Recent Labs    08/28/20 1535 10/20/20 1650 10/26/20 1047 11/06/20 0758  WBC 4.6 3.0* 4.2 3.9*  HGB 9.8* 9.1* 9.1* 9.4*  HCT 28.4* 26.4* 27.7* 28.7*  PLT 100.0* 81.0* 94* 96*    COAGS: Recent Labs    11/06/20 0758  INR 1.1    BMP: Recent Labs    03/16/20 1156 06/04/20 1041 10/26/20 1047  NA 141 141 140  K 4.1 4.6 4.0  CL 103 104 100  CO2 30 30 34*  GLUCOSE 97 98 129*  BUN 19 21 27*  CALCIUM 9.6 9.7 10.7*  CREATININE 0.95 1.14* 1.33*  GFRNONAA  --   --  52*    LIVER FUNCTION TESTS: Recent Labs    03/16/20 1156 06/04/20 1041 10/26/20 1047  BILITOT 0.6 0.6 0.4  AST 16 19 18   ALT 13 18 14   ALKPHOS 64  --  67  PROT 6.1 6.1  6.5  ALBUMIN 4.1  --  4.1    TUMOR MARKERS: No results for input(s): AFPTM, CEA, CA199, CHROMGRNA in the last 8760 hours.  Assessment and Plan: 88 y.o. male past medical history of diverticulosis, hyperlipidemia, hypertension, melanoma, osteoarthritis, PMR, prostate cancer, and now with worsening anemia/monocytosis of uncertain etiology.  He presents today for CT-guided bone marrow biopsy to rule out underlying myelodysplastic process or CML.Risks and benefits of procedure was discussed with the patient  including, but not limited to bleeding, infection, damage to adjacent structures or low yield requiring additional tests.  All of the questions were answered and there is agreement to proceed.  Consent signed and in chart.     Thank you for this interesting consult.  I greatly enjoyed meeting Andrew Ramos and look forward to participating in their care.  A copy of this report was sent to the requesting provider on this date.  Electronically Signed: D. Rowe Catrell, PA-C 11/06/2020, 8:28 AM   I spent a total of 20 minutes in face to face in clinical consultation, greater than 50% of which was counseling/coordinating care for CT-guided bone marrow biopsy

## 2020-11-06 NOTE — Procedures (Signed)
Interventional Radiology Procedure:   Indications: Anemia and monocytosis  Procedure: CT guided bone marrow biopsy  Findings: 2 aspirates and 1 core from right ilium  Complications: None     EBL: Minimal, less than 10 ml  Plan: Discharge to home in one hour.   Kharis Lapenna R. Anselm Pancoast, MD  Pager: [phone]

## 2020-11-10 LAB — SURGICAL PATHOLOGY

## 2020-11-17 ENCOUNTER — Encounter (HOSPITAL_COMMUNITY): Payer: Self-pay | Admitting: Hematology & Oncology

## 2020-11-25 ENCOUNTER — Inpatient Hospital Stay (HOSPITAL_BASED_OUTPATIENT_CLINIC_OR_DEPARTMENT_OTHER): Payer: Medicare Other | Admitting: Hematology & Oncology

## 2020-11-25 ENCOUNTER — Telehealth: Payer: Self-pay | Admitting: *Deleted

## 2020-11-25 ENCOUNTER — Inpatient Hospital Stay: Payer: Medicare Other | Attending: Hematology & Oncology

## 2020-11-25 ENCOUNTER — Encounter: Payer: Self-pay | Admitting: Hematology & Oncology

## 2020-11-25 ENCOUNTER — Other Ambulatory Visit: Payer: Self-pay

## 2020-11-25 VITALS — BP 125/65 | HR 79 | Temp 97.8°F | Resp 18 | Wt 188.0 lb

## 2020-11-25 DIAGNOSIS — Z7982 Long term (current) use of aspirin: Secondary | ICD-10-CM | POA: Diagnosis not present

## 2020-11-25 DIAGNOSIS — D649 Anemia, unspecified: Secondary | ICD-10-CM | POA: Diagnosis not present

## 2020-11-25 DIAGNOSIS — Z79899 Other long term (current) drug therapy: Secondary | ICD-10-CM | POA: Diagnosis not present

## 2020-11-25 DIAGNOSIS — C9310 Chronic myelomonocytic leukemia not having achieved remission: Secondary | ICD-10-CM

## 2020-11-25 DIAGNOSIS — D72821 Monocytosis (symptomatic): Secondary | ICD-10-CM

## 2020-11-25 DIAGNOSIS — Z7189 Other specified counseling: Secondary | ICD-10-CM | POA: Diagnosis not present

## 2020-11-25 DIAGNOSIS — D4620 Refractory anemia with excess of blasts, unspecified: Secondary | ICD-10-CM

## 2020-11-25 HISTORY — DX: Chronic myelomonocytic leukemia not having achieved remission: C93.10

## 2020-11-25 HISTORY — DX: Other specified counseling: Z71.89

## 2020-11-25 LAB — CBC WITH DIFFERENTIAL (CANCER CENTER ONLY)
Abs Immature Granulocytes: 0.01 10*3/uL (ref 0.00–0.07)
Basophils Absolute: 0 10*3/uL (ref 0.0–0.1)
Basophils Relative: 0 %
Eosinophils Absolute: 0 10*3/uL (ref 0.0–0.5)
Eosinophils Relative: 0 %
HCT: 29.3 % — ABNORMAL LOW (ref 39.0–52.0)
Hemoglobin: 9.5 g/dL — ABNORMAL LOW (ref 13.0–17.0)
Immature Granulocytes: 0 %
Lymphocytes Relative: 44 %
Lymphs Abs: 1.7 10*3/uL (ref 0.7–4.0)
MCH: 35.1 pg — ABNORMAL HIGH (ref 26.0–34.0)
MCHC: 32.4 g/dL (ref 30.0–36.0)
MCV: 108.1 fL — ABNORMAL HIGH (ref 80.0–100.0)
Monocytes Absolute: 1.7 10*3/uL — ABNORMAL HIGH (ref 0.1–1.0)
Monocytes Relative: 47 %
Neutro Abs: 0.3 10*3/uL — CL (ref 1.7–7.7)
Neutrophils Relative %: 9 %
Platelet Count: 92 10*3/uL — ABNORMAL LOW (ref 150–400)
RBC: 2.71 MIL/uL — ABNORMAL LOW (ref 4.22–5.81)
RDW: 15.2 % (ref 11.5–15.5)
WBC Count: 3.8 10*3/uL — ABNORMAL LOW (ref 4.0–10.5)
nRBC: 0 % (ref 0.0–0.2)

## 2020-11-25 LAB — CMP (CANCER CENTER ONLY)
ALT: 13 U/L (ref 0–44)
AST: 18 U/L (ref 15–41)
Albumin: 4.4 g/dL (ref 3.5–5.0)
Alkaline Phosphatase: 64 U/L (ref 38–126)
Anion gap: 6 (ref 5–15)
BUN: 28 mg/dL — ABNORMAL HIGH (ref 8–23)
CO2: 31 mmol/L (ref 22–32)
Calcium: 10.6 mg/dL — ABNORMAL HIGH (ref 8.9–10.3)
Chloride: 105 mmol/L (ref 98–111)
Creatinine: 1.04 mg/dL (ref 0.61–1.24)
GFR, Estimated: >60 mL/min (ref >60)
Glucose, Bld: 99 mg/dL (ref 70–99)
Potassium: 3.9 mmol/L (ref 3.5–5.1)
Sodium: 142 mmol/L (ref 135–145)
Total Bilirubin: 0.5 mg/dL (ref 0.3–1.2)
Total Protein: 6.8 g/dL (ref 6.5–8.1)

## 2020-11-25 LAB — RETICULOCYTES
Immature Retic Fract: 14.4 % (ref 2.3–15.9)
RBC.: 2.71 MIL/uL — ABNORMAL LOW (ref 4.22–5.81)
Retic Count, Absolute: 47.7 10*3/uL (ref 19.0–186.0)
Retic Ct Pct: 1.8 % (ref 0.4–3.1)

## 2020-11-25 LAB — SAVE SMEAR(SSMR), FOR PROVIDER SLIDE REVIEW

## 2020-11-25 LAB — IRON AND TIBC
Iron: 76 ug/dL (ref 42–163)
Saturation Ratios: 21 % (ref 20–55)
TIBC: 358 ug/dL (ref 202–409)
UIBC: 281 ug/dL (ref 117–376)

## 2020-11-25 LAB — LACTATE DEHYDROGENASE: LDH: 217 U/L — ABNORMAL HIGH (ref 98–192)

## 2020-11-25 LAB — FERRITIN: Ferritin: 81 ng/mL (ref 24–336)

## 2020-11-25 NOTE — Progress Notes (Signed)
Hematology and Oncology Follow Up Visit  Andrew Ramos [id number] June 12, 1932 87 y.o. 11/25/2020   Principle Diagnosis:   Chronic myelomonocytic leukemia --intermediate 2  risk  Current Therapy:    Observation     Interim History:  Andrew Ramos is back for follow-up.  We saw him back about a month ago.  We went ahead and did a bone marrow biopsy on him.  I did this because when I looked under the microscope that his blood, he had a marked increase in monocytes.  Bone marrow biopsy was done on 11/06/2020.  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  Is certainly appears to be consistent with chronic myelomonocytic leukemia.  By his criteria, he would be considered intermediate-2 risk.  We did chromosomes on him.  These were unremarkable.  He is not symptomatic.  He has had no obvious bleeding.  He has had no obvious weight loss.  He has had no cough or shortness of breath.  He has had no palpable liver or spleen.  He has had no issues with bowels or bladder.  Currently, I would say that his performance status is ECOG 1.  Medications:  Current Outpatient Medications:  .  aspirin 81 MG tablet, Take 1 tablet (81 mg total) by mouth daily., Disp: 30 tablet, Rfl:  .  atorvastatin (LIPITOR) 40 MG tablet, TAKE 1 TABLET DAILY, Disp: 90 tablet, Rfl: 3 .  Calcium Carbonate-Vitamin D (CALCIUM 600 + D PO), Take 1 tablet by mouth every morning. , Disp: , Rfl:  .  Cholecalciferol (VITAMIN D3) 1000 UNITS tablet, Take 1,000 Units by mouth every morning., Disp: , Rfl:  .  donepezil (ARICEPT) 10 MG tablet, TAKE 1 TABLET AT BEDTIME, Disp: 90 tablet, Rfl: 0 .  fluticasone (FLONASE) 50 MCG/ACT nasal spray, Place 2 sprays into both nostrils daily., Disp: 16 g, Rfl: 6 .  hydroxychloroquine (PLAQUENIL) 200 MG tablet, Take 200 mg by mouth 2 (two) times daily., Disp: , Rfl:  .  lisinopril-hydrochlorothiazide (ZESTORETIC) 10-12.5 MG  tablet, TAKE 1 TABLET DAILY, Disp: 90 tablet, Rfl: 3 .  loratadine (CLARITIN) 10 MG tablet, TAKE 1 TABLET DAILY, Disp: 90 tablet, Rfl: 3 .  Multiple Vitamins-Minerals (CENTRUM SILVER ULTRA MENS) TABS, Take 1 tablet by mouth every evening., Disp: , Rfl:  .  polyethylene glycol powder (GLYCOLAX/MIRALAX) powder, Take 17 g by mouth daily as needed for moderate constipation. 1 scoop daily, Disp: , Rfl:  .  terazosin (HYTRIN) 1 MG capsule, TAKE 1 CAPSULE AT BEDTIME, Disp: 90 capsule, Rfl: 3 .  venlafaxine XR (EFFEXOR-XR) 75 MG 24 hr capsule, Take 1 capsule (75 mg total) by mouth daily with breakfast., Disp: 90 capsule, Rfl: 3  Allergies: No Known Allergies  Past Medical History, Surgical history, Social history, and Family History were reviewed and updated.  Review of Systems: Review of Systems  Constitutional: Negative.   HENT:  Negative.   Eyes: Negative.   Respiratory: Negative.   Cardiovascular: Negative.   Gastrointestinal: Negative.   Endocrine: Negative.   Genitourinary: Negative.    Musculoskeletal: Negative.   Skin: Negative.   Neurological: Negative.   Hematological: Negative.   Psychiatric/Behavioral: Negative.     Physical Exam:  weight is 188 lb (85.3 kg). His oral temperature is 97.8 F (36.6 C). His blood pressure is 125/65 and his pulse is 79. His respiration is 18 and oxygen saturation is 100%.   Wt Readings from Last 3 Encounters:  11/25/20 188  lb (85.3 kg)  10/26/20 188 lb (85.3 kg)  10/20/20 187 lb (84.8 kg)    Physical Exam Vitals reviewed.  HENT:     Head: Normocephalic and atraumatic.     Mouth/Throat:     Mouth: Oropharynx is clear and moist.  Eyes:     Extraocular Movements: EOM normal.     Pupils: Pupils are equal, round, and reactive to light.  Cardiovascular:     Rate and Rhythm: Normal rate and regular rhythm.     Heart sounds: Normal heart sounds.  Pulmonary:     Effort: Pulmonary effort is normal.     Breath sounds: Normal breath sounds.   Abdominal:     General: Bowel sounds are normal.     Palpations: Abdomen is soft.  Musculoskeletal:        General: No tenderness, deformity or edema. Normal range of motion.     Cervical back: Normal range of motion.  Lymphadenopathy:     Cervical: No cervical adenopathy.  Skin:    General: Skin is warm and dry.     Findings: No erythema or rash.  Neurological:     Mental Status: He is alert and oriented to person, place, and time.  Psychiatric:        Mood and Affect: Mood and affect normal.        Behavior: Behavior normal.        Thought Content: Thought content normal.        Judgment: Judgment normal.      Lab Results  Component Value Date   WBC 3.8 (L) 11/25/2020   HGB 9.5 (L) 11/25/2020   HCT 29.3 (L) 11/25/2020   MCV 108.1 (H) 11/25/2020   PLT 92 (L) 11/25/2020     Chemistry      Component Value Date/Time   NA 142 11/25/2020 0857   K 3.9 11/25/2020 0857   CL 105 11/25/2020 0857   CO2 31 11/25/2020 0857   BUN 28 (H) 11/25/2020 0857   CREATININE 1.04 11/25/2020 0857   CREATININE 1.14 (H) 06/04/2020 1041      Component Value Date/Time   CALCIUM 10.6 (H) 11/25/2020 0857   ALKPHOS 64 11/25/2020 0857   AST 18 11/25/2020 0857   ALT 13 11/25/2020 0857   BILITOT 0.5 11/25/2020 0857      Impression and Plan: Andrew Ramos is a very nice 87 year old white male.  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  He served in [place].  As such, is a true [employer] hero.  He does have chronic myelomonocytic leukemia.  He is somewhat anemic.  His erythropoietin level was 76 point checked it back in February.  I think at this point, I would probably consider ESA on him if needed.  He has never really not symptomatic.  As such, I do not see that we have to administer this right now.  This is a chronic bone marrow condition that he certainly will live with.  So far, he has a relatively intermediate risk for his prognosis.  I looked at the blood  on the microscope.  I certainly do not see anything that looked suspicious or for any type of aggressive behavior.  Again, this is all about quality of life.  I would like to see him back in 6 weeks.  I think this would be reasonable.   Volanda Napoleon, MD 3/9/202210:55 AM

## 2020-11-25 NOTE — Telephone Encounter (Signed)
Per los 11/25/20 gave patient upcoming appts and calendar

## 2020-12-27 NOTE — Progress Notes (Signed)
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 Last MOCA blind on Oct 08, 2019 18/22. He is on Donepezil 10 mg daily.  Symptoms are overall stable.  . Discussed the diagnosis of dementia. . Discussed safety both in and out of the home. Discussed the importance of regular daily schedule with inclusion of crossword puzzles to maintain brain function.  . Continue to stay active exercising  at least 30 minutes at least 3 times a week.  . Naps should be scheduled and should be no longer than 60 minutes and should not occur after 2 PM.  . Mediterranean diet is recommended information was given in his AVS . Follow-up in 6 months with MOCA.  May need Neurocognitive exam thereafter  . Continue donepezil 10 mg daily, refill x3 was written    Case discussed with Dr. Delice Lesch who agrees with the plan   Subjective:   Andrew Ramos was seen today in follow up for memory loss.  The patient is here alone, "his son could not come because he is in [place] "and his wife "is sick she has some heart problems". My previous records as well as any outside records available were reviewed prior to todays visit.  Pt is currently on Donepezil 10 mg daily and tolerating well.   He reports feeling well, he does not report any specific complaints.  Currently he lives in a retirement community at the [facility] near [place].  He denies any dizziness, unilateral weakness, or headaches.  He denies any recent falls and does not walk with a walker, although occasionally he uses a cane.  He remains active.  He started a walking program of 30 minutes to 1 hour.  He also keeps himself active by  singing in church.  He denies any paranoia or hallucinations.  He denies any vivid dreams.  He sleeps "like a log "between 6 and 8 hours, and only naps about 30 minutes a day.  His appetite is good, denies any trouble swallowing, nausea or vomiting.  He reports intermittent constipation, but he takes MiraLAX with good results.  He cooks, but denies leaving the stove on or any other appliances or faucets on.  He denies any urinary incontinence.  Takes meds by himself, denies missing any doses or overdosing.  He bathes and changes clothes independently.  His mood is good and cheerful, denies any episodes of depression. He pays his own bills, and denies missing any, as he has automatic payments.  He continues to drive, denies finding himself lost, denies disorientation.       History on Initial Assessment 09/23/2016: This is a pleasant 87 yo RH man with a history of hypertension, hyperlipidemia, prostate cancer, melanoma, presenting for evaluation of worsening memory and personality changes. Andrew Ramos himself thinks that he is doing okay, but "people keep telling me I have too many things to think about." He only mostly notices memory problems when he is in a hurry. He states he occasionally forgets conversations but feels this is due to his poor hearing. He denies getting lost driving, no missed medications. He has missed some bill payments a couple of years ago, none recently. His son states he is not nearly as sharp as he once  was, he misplaces things frequently and asks the same questions repeatedly. He relates what the patient's wife has told him, that he is more argumentative. He has left the stove burner on at least twice in the past couple of weeks. He has walked away from the sink running. They have noticed more dents on the car, which the patient is unsure how they happened. They both wonder if these were from parking lot incidents because he states he has never hit another car. No difficulties with  dressing/bathing independently, hygiene is good. On review of PCP notes where wife was present, she reported he forgets to zip his pants, he forgets things he has offered his wife. He brings the wrong utensils to eat with. He forgets simple things his wife asks for. He states that she give him too many instructions. He loses temper with his wife frequently, they are fighting more and she is tired of people telling her to be more patient with him. He was noted to have a blunt affect with paranoid thought content on his PCP visit. MMSE in 06/2016 was 30/30. He was started on Donepezil 5mg  daily, which he is tolerating without side effects.   He denies any headaches, dizziness, diplopia, dysarthria, dysphagia, neck/back pain, focal numbness/tingling/weakness, bladder dysfunction. No anosmia, tremors, no falls. He has occasional constipation. His brother who is 7 years younger than him was diagnosed with dementia after he got lost driving and could not be found for 2 weeks. He denies any significant head injuries, he drinks alcohol very occasionally.   Diagnostic Data: MRI brain with and without contrast did not show any acute changes. There was moderate diffuse volume loss and mild chronic microvascular disease.  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 Results and level of functioning did not warrant a diagnosis of dementia. No signs of a primary psychiatric disorder. Increased irritability/mood changes are likely related to MCI and frontal-subcortical involvement.    PREVIOUS MEDICATIONS: None  CURRENT MEDICATIONS:  Outpatient Encounter Medications as of 12/28/2020  Medication Sig  . aspirin 81 MG tablet Take 1 tablet (81 mg total) by mouth daily.  Marland Kitchen atorvastatin (LIPITOR) 40 MG tablet TAKE 1 TABLET DAILY  . Calcium Carbonate-Vitamin D (CALCIUM 600 + D PO) Take 1 tablet by mouth every morning.    . Cholecalciferol (VITAMIN D3) 1000 UNITS tablet Take 1,000 Units by mouth every morning.  . fluticasone (FLONASE) 50 MCG/ACT nasal spray Place 2 sprays into both nostrils daily.  . hydroxychloroquine (PLAQUENIL) 200 MG tablet Take 200 mg by mouth 2 (two) times daily.  Marland Kitchen lisinopril-hydrochlorothiazide (ZESTORETIC) 10-12.5 MG tablet TAKE 1 TABLET DAILY  . loratadine (CLARITIN) 10 MG tablet TAKE 1 TABLET DAILY  . Multiple Vitamins-Minerals (CENTRUM SILVER ULTRA MENS) TABS Take 1 tablet by mouth every evening.  . polyethylene glycol powder (GLYCOLAX/MIRALAX) powder Take 17 g by mouth daily as needed for moderate constipation. 1 scoop daily  . terazosin (HYTRIN) 1 MG capsule TAKE 1 CAPSULE AT BEDTIME  . venlafaxine XR (EFFEXOR-XR) 75 MG 24 hr capsule Take 1 capsule (75 mg total) by mouth daily with breakfast.  . [DISCONTINUED] donepezil (ARICEPT) 10 MG tablet TAKE 1 TABLET AT BEDTIME  . donepezil (ARICEPT) 10 MG tablet Take 1 tablet (10 mg total) by mouth at bedtime.   No facility-administered encounter medications on file as of 12/28/2020.     Objective:     PHYSICAL EXAMINATION:    VITALS:   Vitals:  12/28/20 0944  BP: (!) 156/70  Pulse: (!) 101  SpO2: 98%  Weight: 186 lb (84.4 kg)  Height: 6' (1.829 m)    GEN:  The patient appears stated age and is in NAD.  HEENT:  Normocephalic, atraumatic.   Neurological examination:  Orientation: The patient is alert. Oriented to person, place and date Cranial nerves: There is good facial symmetry.The speech is fluent and clear.  Hearing is intact to conversational tone with hearing aids.  Sensation: Sensation is intact to light touch throughout Motor: Strength is at least antigravity x4.  Movement examination: Tone: There is normal tone in the UE/LE Abnormal movements:  Mild RUE hand intention tremor.  No myoclonus.  No asterixis.   Coordination:  There is no decremation with RAM's. Gait and Station: The patient has no difficulty  arising out of a deep-seated chair without the use of the hands. The patient's stride length is good.    MMSE - Mini Mental State Exam 09/25/2017 05/08/2017 03/20/2017  Orientation to time 5 5 5   Orientation to Place 5 5 5   Registration 3 3 3   Attention/ Calculation 5 5 5   Recall 2 3 3   Language- name 2 objects 2 2 2   Language- repeat 1 1 1   Language- follow 3 step command 3 3 3   Language- read & follow direction 1 1 1   Write a sentence 1 1 1   Copy design 1 1 1   Total score 29 30 30     Montreal Cognitive Assessment Blind 10/08/2019 03/01/2019 09/23/2016  Attention: Read list of digits (0/2) 2 2 2   Attention: Read list of letters (0/1) 1 1 1   Attention: Serial 7 subtraction starting at 100 (0/3) 3 3 3   Language: Repeat phrase (0/2) 0 0 2  Language : Fluency (0/1) 0 0 0  Abstraction (0/2) 1 0 2  Delayed Recall (0/5) 5 0 2  Orientation (0/6) 6 6 6   Total 18 12 -  Adjusted Score (based on education) - 13 -   CBC    Component Value Date/Time   WBC 3.8 (L) 11/25/2020 0857   WBC 3.9 (L) 11/06/2020 0758   RBC 2.71 (L) 11/25/2020 0857   RBC 2.71 (L) 11/25/2020 0857   HGB 9.5 (L) 11/25/2020 0857   HCT 29.3 (L) 11/25/2020 0857   PLT 92 (L) 11/25/2020 0857   MCV 108.1 (H) 11/25/2020 0857   MCH 35.1 (H) 11/25/2020 0857   MCHC 32.4 11/25/2020 0857   RDW 15.2 11/25/2020 0857   LYMPHSABS 1.7 11/25/2020 0857   MONOABS 1.7 (H) 11/25/2020 0857   EOSABS 0.0 11/25/2020 0857   BASOSABS 0.0 11/25/2020 0857     CMP Latest Ref Rng & Units 11/25/2020 10/26/2020 06/04/2020  Glucose 70 - 99 mg/dL 99 129(H) 98  BUN 8 - 23 mg/dL 28(H) 27(H) 21  Creatinine 0.61 - 1.24 mg/dL 1.04 1.33(H) 1.14(H)  Sodium 135 - 145 mmol/L 142 140 141  Potassium 3.5 - 5.1 mmol/L 3.9 4.0 4.6  Chloride 98 - 111 mmol/L 105 100 104  CO2 22 - 32 mmol/L 31 34(H) 30  Calcium 8.9 - 10.3 mg/dL 10.6(H) 10.7(H) 9.7  Total Protein 6.5 - 8.1 g/dL 6.8 6.5 6.1  Total Bilirubin 0.3 - 1.2 mg/dL 0.5 0.4 0.6  Alkaline Phos 38 - 126 U/L 64  67 -  AST 15 - 41 U/L 18 18 19   ALT 0 - 44 U/L 13 14 18        Total time spent on today's visit was45 minutes,  including both face-to-face time and nonface-to-face time.  Time included that spent on review of records (prior notes available to me/labs/imaging if pertinent), discussing treatment and goals, answering patient's questions and coordinating care.  Cc:  Carollee Herter, Alferd Apa, DO Sharene Butters, PA-C

## 2020-12-28 ENCOUNTER — Other Ambulatory Visit: Payer: Self-pay

## 2020-12-28 ENCOUNTER — Encounter: Payer: Self-pay | Admitting: Physician Assistant

## 2020-12-28 ENCOUNTER — Ambulatory Visit (INDEPENDENT_AMBULATORY_CARE_PROVIDER_SITE_OTHER): Payer: Medicare Other | Admitting: Physician Assistant

## 2020-12-28 VITALS — BP 156/70 | HR 101 | Ht 72.0 in | Wt 186.0 lb

## 2020-12-28 DIAGNOSIS — E079 Disorder of thyroid, unspecified: Secondary | ICD-10-CM | POA: Insufficient documentation

## 2020-12-28 DIAGNOSIS — M1990 Unspecified osteoarthritis, unspecified site: Secondary | ICD-10-CM | POA: Insufficient documentation

## 2020-12-28 DIAGNOSIS — F0150 Vascular dementia without behavioral disturbance: Secondary | ICD-10-CM | POA: Diagnosis not present

## 2020-12-28 DIAGNOSIS — M353 Polymyalgia rheumatica: Secondary | ICD-10-CM | POA: Insufficient documentation

## 2020-12-28 MED ORDER — DONEPEZIL HCL 10 MG PO TABS: 10.0000 mg | ORAL_TABLET | Freq: Every day | ORAL | 3 refills | Status: DC

## 2020-12-28 NOTE — Patient Instructions (Addendum)
It was a pleasure to see you today at our office.   Recommendations:  Meds: Follow up in 6  Months A refill for Aricept 10 mg a day was given     RECOMMENDATIONS FOR ALL PATIENTS WITH MEMORY PROBLEMS: 1. Continue to exercise (Recommend 30 minutes of walking everyday, or 3 hours every week) 2. Increase social interactions - continue going to [place] and enjoy social gatherings with friends and family 3. Eat healthy, avoid fried foods and eat more fruits and vegetables 4. Maintain adequate blood pressure, blood sugar, and blood cholesterol level. Reducing the risk of stroke and cardiovascular disease also helps promoting better memory. 5. Avoid stressful situations. Live a simple life and avoid aggravations. Organize your time and prepare for the next day in anticipation. 6. Sleep well, avoid any interruptions of sleep and avoid any distractions in the bedroom that may interfere with adequate sleep quality 7. Avoid sugar, avoid sweets as there is a strong link between excessive sugar intake, diabetes, and cognitive impairment We discussed the Mediterranean diet, which has been shown to help patients reduce the risk of progressive memory disorders and reduces cardiovascular risk. This includes eating fish, eat fruits and green leafy vegetables, nuts like almonds and hazelnuts, walnuts, and also use olive oil. Avoid fast foods and fried foods as much as possible. Avoid sweets and sugar as sugar use has been linked to worsening of memory function.  There is always a concern of gradual progression of memory problems. If this is the case, then we may need to adjust level of care according to patient needs. Support, both to the patient and caregiver, should then be put into place.      FALL PRECAUTIONS: Be cautious when walking. Scan the area for obstacles that may increase the risk of trips and falls. When getting up in the mornings, sit up at the edge of the bed for a few minutes before getting out  of bed. Consider elevating the bed at the head end to avoid drop of blood pressure when getting up. Walk always in a well-lit room (use night lights in the walls). Avoid area rugs or power cords from appliances in the middle of the walkways. Use a walker or a cane if necessary and consider physical therapy for balance exercise. Get your eyesight checked regularly.   HOME SAFETY: Consider the safety of the kitchen when operating appliances like stoves, microwave oven, and blender. Consider having supervision and share cooking responsibilities until no longer able to participate in those. Accidents with firearms and other hazards in the house should be identified and addressed as well.   DRIVING: Regarding driving, in patients with progressive memory problems, driving will be impaired. We advise to have someone else do the driving if trouble finding directions or if minor accidents are reported. Independent driving assessment is available to determine safety of driving.       Mediterranean Diet A Mediterranean diet refers to food and lifestyle choices that are based on the traditions of countries located on the [employer]. This way of eating has been shown to help prevent certain conditions and improve outcomes for people who have chronic diseases, like kidney disease and heart disease. What are tips for following this plan? Lifestyle   Cook and eat meals together with your family, when possible.  Drink enough fluid to keep your urine clear or pale yellow.  Be physically active every day. This includes:  Aerobic exercise like running or swimming.  Leisure activities like  gardening, walking, or housework.  Get 7-8 hours of sleep each night.  If recommended by your health care provider, drink red wine in moderation. This means 1 glass a day for nonpregnant women and 2 glasses a day for men. A glass of wine equals 5 oz (150 mL). Reading food labels   Check the serving size of  packaged foods. For foods such as rice and pasta, the serving size refers to the amount of cooked product, not dry.  Check the total fat in packaged foods. Avoid foods that have saturated fat or trans fats.  Check the ingredients list for added sugars, such as corn syrup. Shopping   At the grocery store, buy most of your food from the areas near the walls of the store. This includes:  Fresh fruits and vegetables (produce).  Grains, beans, nuts, and seeds. Some of these may be available in unpackaged forms or large amounts (in bulk).  Fresh seafood.  Poultry and eggs.  Low-fat dairy products.  Buy whole ingredients instead of prepackaged foods.  Buy fresh fruits and vegetables in-season from local farmers markets.  Buy frozen fruits and vegetables in resealable bags.  If you do not have access to quality fresh seafood, buy precooked frozen shrimp or canned fish, such as tuna, salmon, or sardines.  Buy small amounts of raw or cooked vegetables, salads, or olives from the deli or salad bar at your store.  Stock your pantry so you always have certain foods on hand, such as olive oil, canned tuna, canned tomatoes, rice, pasta, and beans. Cooking   Cook foods with extra-virgin olive oil instead of using butter or other vegetable oils.  Have meat as a side dish, and have vegetables or grains as your main dish. This means having meat in small portions or adding small amounts of meat to foods like pasta or stew.  Use beans or vegetables instead of meat in common dishes like chili or lasagna.  Experiment with different cooking methods. Try roasting or broiling vegetables instead of steaming or sauteing them.  Add frozen vegetables to soups, stews, pasta, or rice.  Add nuts or seeds for added healthy fat at each meal. You can add these to yogurt, salads, or vegetable dishes.  Marinate fish or vegetables using olive oil, lemon juice, garlic, and fresh herbs. Meal planning   Plan  to eat 1 vegetarian meal one day each week. Try to work up to 2 vegetarian meals, if possible.  Eat seafood 2 or more times a week.  Have healthy snacks readily available, such as:  Vegetable sticks with hummus.  Greek yogurt.  Fruit and nut trail mix.  Eat balanced meals throughout the week. This includes:  Fruit: 2-3 servings a day  Vegetables: 4-5 servings a day  Low-fat dairy: 2 servings a day  Fish, poultry, or lean meat: 1 serving a day  Beans and legumes: 2 or more servings a week  Nuts and seeds: 1-2 servings a day  Whole grains: 6-8 servings a day  Extra-virgin olive oil: 3-4 servings a day  Limit red meat and sweets to only a few servings a month What are my food choices?  Mediterranean diet  Recommended  Grains: Whole-grain pasta. Brown rice. Bulgar wheat. Polenta. Couscous. Whole-wheat bread. [place].  Vegetables: Artichokes. Beets. Broccoli. Cabbage. Carrots. Eggplant. Green beans. Chard. Kale. Spinach. Onions. Leeks. Peas. Squash. Tomatoes. Peppers. Radishes.  Fruits: Apples. Apricots. Avocado. Berries. Bananas. Cherries. Dates. Figs. Grapes. Lemons. Melon. Oranges. Peaches. Plums. Pomegranate.  Meats and other protein foods: Beans. Almonds. Sunflower seeds. Pine nuts. Peanuts. [place]. Salmon. Scallops. Shrimp. Citrus. Tilapia. Clams. Oysters. Eggs.  Dairy: Low-fat milk. Cheese. Greek yogurt.  Beverages: Water. Red wine. Herbal tea.  Fats and oils: Extra virgin olive oil. Avocado oil. Grape seed oil.  Sweets and desserts: [place] yogurt with honey. Baked apples. Poached pears. Trail mix.  Seasoning and other foods: Basil. Cilantro. Coriander. Cumin. Mint. Parsley. Sage. Rosemary. Tarragon. Garlic. Oregano. Thyme. Pepper. Balsalmic vinegar. Tahini. Hummus. Tomato sauce. Olives. Mushrooms.  Limit these  Grains: Prepackaged pasta or rice dishes. Prepackaged cereal with added sugar.  Vegetables: Deep fried potatoes (french fries).  Fruits: Fruit  canned in syrup.  Meats and other protein foods: Beef. Pork. Lamb. Poultry with skin. Hot dogs. Berniece Salines.  Dairy: Ice cream. Sour cream. Whole milk.  Beverages: Juice. Sugar-sweetened soft drinks. Beer. Liquor and spirits.  Fats and oils: Butter. Canola oil. Vegetable oil. Beef fat (tallow). Lard.  Sweets and desserts: Cookies. Cakes. Pies. Candy.  Seasoning and other foods: Mayonnaise. Premade sauces and marinades.  The items listed may not be a complete list. Talk with your dietitian about what dietary choices are right for you. Summary  The Mediterranean diet includes both food and lifestyle choices.  Eat a variety of fresh fruits and vegetables, beans, nuts, seeds, and whole grains.  Limit the amount of red meat and sweets that you eat.  Talk with your health care provider about whether it is safe for you to drink red wine in moderation. This means 1 glass a day for nonpregnant women and 2 glasses a day for men. A glass of wine equals 5 oz (150 mL). This information is not intended to replace advice given to you by your health care provider. Make sure you discuss any questions you have with your health care provider. Document Released: 04/28/2016 Document Revised: 05/31/2016 Document Reviewed: 04/28/2016 Elsevier Interactive Patient Education  2017 [employer].

## 2020-12-30 ENCOUNTER — Telehealth: Payer: Self-pay | Admitting: Family Medicine

## 2020-12-30 NOTE — Telephone Encounter (Signed)
What are your recommendations?

## 2020-12-30 NOTE — Telephone Encounter (Signed)
Patient would like to know if Dr Etter Sjogren recommends her patient to get the second booster vaccine for covid

## 2020-12-30 NOTE — Telephone Encounter (Signed)
Yes --- I recommend most people over 66 get it  They can get in downstairs MTues and Friday 9-3

## 2021-01-04 ENCOUNTER — Inpatient Hospital Stay: Payer: Medicare Other | Attending: Hematology & Oncology

## 2021-01-04 ENCOUNTER — Telehealth: Payer: Self-pay

## 2021-01-04 ENCOUNTER — Encounter: Payer: Self-pay | Admitting: Hematology & Oncology

## 2021-01-04 ENCOUNTER — Other Ambulatory Visit: Payer: Self-pay

## 2021-01-04 ENCOUNTER — Inpatient Hospital Stay (HOSPITAL_BASED_OUTPATIENT_CLINIC_OR_DEPARTMENT_OTHER): Payer: Medicare Other | Admitting: Hematology & Oncology

## 2021-01-04 VITALS — BP 142/85 | HR 87 | Temp 97.8°F | Resp 19 | Wt 187.0 lb

## 2021-01-04 DIAGNOSIS — C9311 Chronic myelomonocytic leukemia, in remission: Secondary | ICD-10-CM

## 2021-01-04 DIAGNOSIS — Z79899 Other long term (current) drug therapy: Secondary | ICD-10-CM | POA: Diagnosis not present

## 2021-01-04 DIAGNOSIS — C9310 Chronic myelomonocytic leukemia not having achieved remission: Secondary | ICD-10-CM | POA: Diagnosis not present

## 2021-01-04 DIAGNOSIS — D4620 Refractory anemia with excess of blasts, unspecified: Secondary | ICD-10-CM

## 2021-01-04 DIAGNOSIS — D649 Anemia, unspecified: Secondary | ICD-10-CM | POA: Insufficient documentation

## 2021-01-04 DIAGNOSIS — Z7982 Long term (current) use of aspirin: Secondary | ICD-10-CM | POA: Diagnosis not present

## 2021-01-04 LAB — CMP (CANCER CENTER ONLY)
ALT: 12 U/L (ref 0–44)
AST: 16 U/L (ref 15–41)
Albumin: 4.2 g/dL (ref 3.5–5.0)
Alkaline Phosphatase: 71 U/L (ref 38–126)
Anion gap: 7 (ref 5–15)
BUN: 34 mg/dL — ABNORMAL HIGH (ref 8–23)
CO2: 32 mmol/L (ref 22–32)
Calcium: 10.7 mg/dL — ABNORMAL HIGH (ref 8.9–10.3)
Chloride: 105 mmol/L (ref 98–111)
Creatinine: 1.21 mg/dL (ref 0.61–1.24)
GFR, Estimated: 58 mL/min — ABNORMAL LOW (ref >60)
Glucose, Bld: 110 mg/dL — ABNORMAL HIGH (ref 70–99)
Potassium: 4.6 mmol/L (ref 3.5–5.1)
Sodium: 144 mmol/L (ref 135–145)
Total Bilirubin: 0.4 mg/dL (ref 0.3–1.2)
Total Protein: 7 g/dL (ref 6.5–8.1)

## 2021-01-04 LAB — CBC WITH DIFFERENTIAL (CANCER CENTER ONLY)
Abs Immature Granulocytes: 0.02 10*3/uL (ref 0.00–0.07)
Basophils Absolute: 0 10*3/uL (ref 0.0–0.1)
Basophils Relative: 0 %
Eosinophils Absolute: 0 10*3/uL (ref 0.0–0.5)
Eosinophils Relative: 0 %
HCT: 29.9 % — ABNORMAL LOW (ref 39.0–52.0)
Hemoglobin: 9.6 g/dL — ABNORMAL LOW (ref 13.0–17.0)
Immature Granulocytes: 0 %
Lymphocytes Relative: 42 %
Lymphs Abs: 2 10*3/uL (ref 0.7–4.0)
MCH: 35.6 pg — ABNORMAL HIGH (ref 26.0–34.0)
MCHC: 32.1 g/dL (ref 30.0–36.0)
MCV: 110.7 fL — ABNORMAL HIGH (ref 80.0–100.0)
Monocytes Absolute: 2.2 10*3/uL — ABNORMAL HIGH (ref 0.1–1.0)
Monocytes Relative: 47 %
Neutro Abs: 0.5 10*3/uL — ABNORMAL LOW (ref 1.7–7.7)
Neutrophils Relative %: 11 %
Platelet Count: 87 10*3/uL — ABNORMAL LOW (ref 150–400)
RBC: 2.7 MIL/uL — ABNORMAL LOW (ref 4.22–5.81)
RDW: 14.6 % (ref 11.5–15.5)
WBC Count: 4.8 10*3/uL (ref 4.0–10.5)
nRBC: 0 % (ref 0.0–0.2)

## 2021-01-04 LAB — LACTATE DEHYDROGENASE: LDH: 232 U/L — ABNORMAL HIGH (ref 98–192)

## 2021-01-04 LAB — SAVE SMEAR(SSMR), FOR PROVIDER SLIDE REVIEW

## 2021-01-04 NOTE — Telephone Encounter (Signed)
appts made and printed for pt per 01/04/21 los   [employer]

## 2021-01-04 NOTE — Telephone Encounter (Signed)
Pt and wife made aware  

## 2021-01-04 NOTE — Progress Notes (Signed)
Hematology and Oncology Follow Up Visit  Andrew Ramos [id number] 04/10/1932 87 y.o. 01/04/2021   Principle Diagnosis:   Chronic myelomonocytic leukemia --intermediate 2  risk  Current Therapy:    Observation     Interim History:  Mr. Andrew Ramos is back for follow-up.  So far, everything is going pretty well with him.  He is really had no specific complaints.  He has had no issues with fever.  He has had no bleeding.  He has had no nausea or vomiting.  He did have a nice Easter weekend.  There has been some swelling in his legs.  His chronic swelling in his left leg from past injury about 50 years ago.  There is been no problems with rashes.  He has had no cough or shortness of breath.  Overall, I would say his performance status is ECOG 1.    Medications:  Current Outpatient Medications:  .  alendronate (FOSAMAX) 70 MG tablet, Take 70 mg by mouth once a week., Disp: , Rfl:  .  rosuvastatin (CRESTOR) 40 MG tablet, Take 0.5 tablets by mouth daily., Disp: , Rfl:  .  venlafaxine XR (EFFEXOR-XR) 75 MG 24 hr capsule, , Disp: , Rfl:  .  aspirin 81 MG tablet, Take 1 tablet (81 mg total) by mouth daily., Disp: 30 tablet, Rfl:  .  atorvastatin (LIPITOR) 40 MG tablet, TAKE 1 TABLET DAILY, Disp: 90 tablet, Rfl: 3 .  Calcium Carbonate-Vitamin D (CALCIUM 600 + D PO), Take 1 tablet by mouth every morning. , Disp: , Rfl:  .  Cholecalciferol (VITAMIN D3) 1000 UNITS tablet, Take 1,000 Units by mouth every morning., Disp: , Rfl:  .  donepezil (ARICEPT) 10 MG tablet, Take 1 tablet (10 mg total) by mouth at bedtime., Disp: 90 tablet, Rfl: 3 .  fluticasone (FLONASE) 50 MCG/ACT nasal spray, Place 2 sprays into both nostrils daily., Disp: 16 g, Rfl: 6 .  hydroxychloroquine (PLAQUENIL) 200 MG tablet, Take 200 mg by mouth 2 (two) times daily., Disp: , Rfl:  .  lisinopril-hydrochlorothiazide (ZESTORETIC) 10-12.5 MG tablet, TAKE 1 TABLET DAILY, Disp: 90 tablet, Rfl: 3 .  loratadine (CLARITIN) 10 MG tablet,  TAKE 1 TABLET DAILY, Disp: 90 tablet, Rfl: 3 .  Multiple Vitamins-Minerals (CENTRUM SILVER ULTRA MENS) TABS, Take 1 tablet by mouth every evening., Disp: , Rfl:  .  polyethylene glycol powder (GLYCOLAX/MIRALAX) powder, Take 17 g by mouth daily as needed for moderate constipation. 1 scoop daily, Disp: , Rfl:  .  terazosin (HYTRIN) 1 MG capsule, TAKE 1 CAPSULE AT BEDTIME, Disp: 90 capsule, Rfl: 3 .  venlafaxine XR (EFFEXOR-XR) 75 MG 24 hr capsule, Take 1 capsule (75 mg total) by mouth daily with breakfast., Disp: 90 capsule, Rfl: 3  Allergies: No Known Allergies  Past Medical History, Surgical history, Social history, and Family History were reviewed and updated.  Review of Systems: Review of Systems  Constitutional: Negative.   HENT:  Negative.   Eyes: Negative.   Respiratory: Negative.   Cardiovascular: Negative.   Gastrointestinal: Negative.   Endocrine: Negative.   Genitourinary: Negative.    Musculoskeletal: Negative.   Skin: Negative.   Neurological: Negative.   Hematological: Negative.   Psychiatric/Behavioral: Negative.     Physical Exam:  weight is 187 lb (84.8 kg). His oral temperature is 97.8 F (36.6 C). His blood pressure is 142/85 (abnormal) and his pulse is 87. His respiration is 19 and oxygen saturation is 100%.   Wt Readings from Last 3 Encounters:  01/04/21  187 lb (84.8 kg)  12/28/20 186 lb (84.4 kg)  11/25/20 188 lb (85.3 kg)    Physical Exam Vitals reviewed.  HENT:     Head: Normocephalic and atraumatic.  Eyes:     Pupils: Pupils are equal, round, and reactive to light.  Cardiovascular:     Rate and Rhythm: Normal rate and regular rhythm.     Heart sounds: Normal heart sounds.  Pulmonary:     Effort: Pulmonary effort is normal.     Breath sounds: Normal breath sounds.  Abdominal:     General: Bowel sounds are normal.     Palpations: Abdomen is soft.  Musculoskeletal:        General: No tenderness or deformity. Normal range of motion.      Cervical back: Normal range of motion.  Lymphadenopathy:     Cervical: No cervical adenopathy.  Skin:    General: Skin is warm and dry.     Findings: No erythema or rash.  Neurological:     Mental Status: He is alert and oriented to person, place, and time.  Psychiatric:        Behavior: Behavior normal.        Thought Content: Thought content normal.        Judgment: Judgment normal.      Lab Results  Component Value Date   WBC 4.8 01/04/2021   HGB 9.6 (L) 01/04/2021   HCT 29.9 (L) 01/04/2021   MCV 110.7 (H) 01/04/2021   PLT 87 (L) 01/04/2021     Chemistry      Component Value Date/Time   NA 144 01/04/2021 0854   K 4.6 01/04/2021 0854   CL 105 01/04/2021 0854   CO2 32 01/04/2021 0854   BUN 34 (H) 01/04/2021 0854   CREATININE 1.21 01/04/2021 0854   CREATININE 1.14 (H) 06/04/2020 1041      Component Value Date/Time   CALCIUM 10.7 (H) 01/04/2021 0854   ALKPHOS 71 01/04/2021 0854   AST 16 01/04/2021 0854   ALT 12 01/04/2021 0854   BILITOT 0.4 01/04/2021 0854      Impression and Plan: [name] is a very nice 87 year old white male.  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  He served in [place].  As such, is a true [employer] hero.  He does have chronic myelomonocytic leukemia.  He is somewhat anemic.  His erythropoietin level was 76 point checked it back in February.  I think at this point, I would probably consider ESA on him if needed.  He has never really not symptomatic.  As such, I do not see that we have to administer this right now.  This is a chronic bone marrow condition that he certainly will live with.  So far, he has a relatively intermediate risk for his prognosis.  I looked at the blood on the microscope.  I certainly do not see anything that looked suspicious or for any type of aggressive behavior.  Again, this is all about quality of life.  I would like to see him back in 6 weeks.  I think this would be  reasonable.   Volanda Napoleon, MD 4/18/202210:11 AM

## 2021-01-05 LAB — ERYTHROPOIETIN: Erythropoietin: 68.8 m[IU]/mL — ABNORMAL HIGH (ref 2.6–18.5)

## 2021-01-07 ENCOUNTER — Other Ambulatory Visit: Payer: Self-pay | Admitting: Family Medicine

## 2021-01-07 DIAGNOSIS — J3089 Other allergic rhinitis: Secondary | ICD-10-CM

## 2021-01-08 ENCOUNTER — Ambulatory Visit: Payer: Medicare Other | Attending: Internal Medicine

## 2021-01-08 DIAGNOSIS — Z23 Encounter for immunization: Secondary | ICD-10-CM

## 2021-01-08 NOTE — Progress Notes (Signed)
   Covid-19 Vaccination Clinic  Name:  Andrew Ramos    MRN: [id number] DOB: 1932-06-16  01/08/2021  [name] was observed post Covid-19 immunization for 15 minutes without incident. He was provided with Vaccine Information Sheet and instruction to access the V-Safe system.   [name] was instructed to call 911 with any severe reactions post vaccine: Marland Kitchen Difficulty breathing  . Swelling of face and throat  . A fast heartbeat  . A bad rash all over body  . Dizziness and weakness   Immunizations Administered    Name Date Dose VIS Date Route   Moderna Covid-19 Booster Vaccine 01/08/2021  2:16 PM 0.25 mL 07/08/2020 Intramuscular   Manufacturer: Moderna   Lot: [id number]   [place]: [id number]

## 2021-01-11 ENCOUNTER — Other Ambulatory Visit (HOSPITAL_BASED_OUTPATIENT_CLINIC_OR_DEPARTMENT_OTHER): Payer: Self-pay

## 2021-01-11 DIAGNOSIS — Z23 Encounter for immunization: Secondary | ICD-10-CM | POA: Diagnosis not present

## 2021-01-11 MED ORDER — MODERNA COVID-19 VACCINE 100 MCG/0.5ML IM SUSP
INTRAMUSCULAR | 0 refills | Status: DC
  Filled 2021-01-11: qty 0.25, 1d supply, fill #0

## 2021-02-01 ENCOUNTER — Ambulatory Visit: Payer: Medicare Other | Admitting: Neurology

## 2021-02-17 ENCOUNTER — Telehealth: Payer: Self-pay | Admitting: *Deleted

## 2021-02-17 ENCOUNTER — Telehealth: Payer: Self-pay

## 2021-02-17 ENCOUNTER — Inpatient Hospital Stay: Payer: Medicare Other | Attending: Hematology & Oncology

## 2021-02-17 ENCOUNTER — Inpatient Hospital Stay (HOSPITAL_BASED_OUTPATIENT_CLINIC_OR_DEPARTMENT_OTHER): Payer: Medicare Other | Admitting: Hematology & Oncology

## 2021-02-17 ENCOUNTER — Encounter: Payer: Self-pay | Admitting: Hematology & Oncology

## 2021-02-17 ENCOUNTER — Other Ambulatory Visit: Payer: Self-pay

## 2021-02-17 VITALS — BP 124/62 | HR 82 | Temp 98.1°F | Resp 17

## 2021-02-17 DIAGNOSIS — M7989 Other specified soft tissue disorders: Secondary | ICD-10-CM | POA: Diagnosis not present

## 2021-02-17 DIAGNOSIS — D860 Sarcoidosis of lung: Secondary | ICD-10-CM | POA: Insufficient documentation

## 2021-02-17 DIAGNOSIS — Z79899 Other long term (current) drug therapy: Secondary | ICD-10-CM | POA: Insufficient documentation

## 2021-02-17 DIAGNOSIS — C9310 Chronic myelomonocytic leukemia not having achieved remission: Secondary | ICD-10-CM | POA: Diagnosis not present

## 2021-02-17 DIAGNOSIS — C9311 Chronic myelomonocytic leukemia, in remission: Secondary | ICD-10-CM

## 2021-02-17 DIAGNOSIS — D649 Anemia, unspecified: Secondary | ICD-10-CM | POA: Insufficient documentation

## 2021-02-17 LAB — CBC WITH DIFFERENTIAL (CANCER CENTER ONLY)
Abs Immature Granulocytes: 0.01 10*3/uL (ref 0.00–0.07)
Basophils Absolute: 0 10*3/uL (ref 0.0–0.1)
Basophils Relative: 0 %
Eosinophils Absolute: 0 10*3/uL (ref 0.0–0.5)
Eosinophils Relative: 0 %
HCT: 27.5 % — ABNORMAL LOW (ref 39.0–52.0)
Hemoglobin: 9.1 g/dL — ABNORMAL LOW (ref 13.0–17.0)
Immature Granulocytes: 0 %
Lymphocytes Relative: 55 %
Lymphs Abs: 1.8 10*3/uL (ref 0.7–4.0)
MCH: 36.1 pg — ABNORMAL HIGH (ref 26.0–34.0)
MCHC: 33.1 g/dL (ref 30.0–36.0)
MCV: 109.1 fL — ABNORMAL HIGH (ref 80.0–100.0)
Monocytes Absolute: 1.1 10*3/uL — ABNORMAL HIGH (ref 0.1–1.0)
Monocytes Relative: 33 %
Neutro Abs: 0.4 10*3/uL — CL (ref 1.7–7.7)
Neutrophils Relative %: 12 %
Platelet Count: 102 10*3/uL — ABNORMAL LOW (ref 150–400)
RBC: 2.52 MIL/uL — ABNORMAL LOW (ref 4.22–5.81)
RDW: 14.8 % (ref 11.5–15.5)
WBC Count: 3.2 10*3/uL — ABNORMAL LOW (ref 4.0–10.5)
nRBC: 0 % (ref 0.0–0.2)

## 2021-02-17 LAB — IRON AND TIBC
Iron: 148 ug/dL (ref 42–163)
Saturation Ratios: 43 % (ref 20–55)
TIBC: 343 ug/dL (ref 202–409)
UIBC: 194 ug/dL (ref 117–376)

## 2021-02-17 LAB — RETICULOCYTES
Immature Retic Fract: 14.1 % (ref 2.3–15.9)
RBC.: 2.51 MIL/uL — ABNORMAL LOW (ref 4.22–5.81)
Retic Count, Absolute: 44.4 10*3/uL (ref 19.0–186.0)
Retic Ct Pct: 1.8 % (ref 0.4–3.1)

## 2021-02-17 LAB — CMP (CANCER CENTER ONLY)
ALT: 11 U/L (ref 0–44)
AST: 16 U/L (ref 15–41)
Albumin: 4.2 g/dL (ref 3.5–5.0)
Alkaline Phosphatase: 62 U/L (ref 38–126)
Anion gap: 8 (ref 5–15)
BUN: 24 mg/dL — ABNORMAL HIGH (ref 8–23)
CO2: 31 mmol/L (ref 22–32)
Calcium: 10.3 mg/dL (ref 8.9–10.3)
Chloride: 102 mmol/L (ref 98–111)
Creatinine: 1.13 mg/dL (ref 0.61–1.24)
GFR, Estimated: >60 mL/min (ref >60)
Glucose, Bld: 122 mg/dL — ABNORMAL HIGH (ref 70–99)
Potassium: 3.6 mmol/L (ref 3.5–5.1)
Sodium: 141 mmol/L (ref 135–145)
Total Bilirubin: 0.5 mg/dL (ref 0.3–1.2)
Total Protein: 6.5 g/dL (ref 6.5–8.1)

## 2021-02-17 LAB — SAVE SMEAR(SSMR), FOR PROVIDER SLIDE REVIEW

## 2021-02-17 LAB — FERRITIN: Ferritin: 84 ng/mL (ref 24–336)

## 2021-02-17 NOTE — Telephone Encounter (Signed)
Dr. Ennever notified of ANC-0.4.  No new orders received at this time.  

## 2021-02-17 NOTE — Telephone Encounter (Signed)
appts made and printed for pt per 02/17/21 los   [employer]

## 2021-02-17 NOTE — Progress Notes (Signed)
Hematology and Oncology Follow Up Visit  Andrew Ramos [id number] 1932-09-13 87 y.o. 02/17/2021   Principle Diagnosis:   Chronic myelomonocytic leukemia --intermediate 2  risk  Current Therapy:    Observation     Interim History:  Andrew Ramos is back for follow-up.  He is doing okay.  He really has had no specific complaints.  There is been no problems with bleeding or bruising.  He is really not had any fatigue.  He is trying to stay active around the house.  His wife is having her health problems.  She has a pulmonary sarcoidosis.  They are very cautious with going anywhere.  They are afraid of the coronavirus.  So far, everything is going pretty well with him.  He is really had no specific complaints.  He has had no issues with fever.  He has had no bleeding.  He has had no nausea or vomiting.  He did have a nice Easter weekend.  There has been some swelling in his legs.  His chronic swelling in his left leg from past injury about 50 years ago.  There is been no problems with rashes.  He has had no cough or shortness of breath.  Overall, I would say his performance status is ECOG 1.    Medications:  Current Outpatient Medications:  .  alendronate (FOSAMAX) 70 MG tablet, Take 70 mg by mouth once a week., Disp: , Rfl:  .  aspirin 81 MG tablet, Take 1 tablet (81 mg total) by mouth daily., Disp: 30 tablet, Rfl:  .  atorvastatin (LIPITOR) 40 MG tablet, TAKE 1 TABLET DAILY, Disp: 90 tablet, Rfl: 3 .  Calcium Carbonate-Vitamin D (CALCIUM 600 + D PO), Take 1 tablet by mouth every morning. , Disp: , Rfl:  .  Cholecalciferol (VITAMIN D3) 1000 UNITS tablet, Take 1,000 Units by mouth every morning., Disp: , Rfl:  .  COVID-19 mRNA vaccine, Moderna, (MODERNA COVID-19 VACCINE) 100 MCG/0.5ML injection, Inject into the muscle., Disp: 0.25 mL, Rfl: 0 .  donepezil (ARICEPT) 10 MG tablet, Take 1 tablet (10 mg total) by mouth at bedtime., Disp: 90 tablet, Rfl: 3 .  fluticasone (FLONASE) 50  MCG/ACT nasal spray, Place 2 sprays into both nostrils daily., Disp: 16 g, Rfl: 6 .  hydroxychloroquine (PLAQUENIL) 200 MG tablet, Take 200 mg by mouth 2 (two) times daily., Disp: , Rfl:  .  lisinopril-hydrochlorothiazide (ZESTORETIC) 10-12.5 MG tablet, TAKE 1 TABLET DAILY, Disp: 90 tablet, Rfl: 3 .  loratadine (CLARITIN) 10 MG tablet, TAKE 1 TABLET DAILY, Disp: 90 tablet, Rfl: 3 .  Multiple Vitamins-Minerals (CENTRUM SILVER ULTRA MENS) TABS, Take 1 tablet by mouth every evening., Disp: , Rfl:  .  polyethylene glycol powder (GLYCOLAX/MIRALAX) powder, Take 17 g by mouth daily as needed for moderate constipation. 1 scoop daily, Disp: , Rfl:  .  rosuvastatin (CRESTOR) 40 MG tablet, Take 0.5 tablets by mouth daily., Disp: , Rfl:  .  terazosin (HYTRIN) 1 MG capsule, TAKE 1 CAPSULE AT BEDTIME, Disp: 90 capsule, Rfl: 3 .  venlafaxine XR (EFFEXOR-XR) 75 MG 24 hr capsule, Take 1 capsule (75 mg total) by mouth daily with breakfast., Disp: 90 capsule, Rfl: 3 .  venlafaxine XR (EFFEXOR-XR) 75 MG 24 hr capsule, , Disp: , Rfl:   Allergies: No Known Allergies  Past Medical History, Surgical history, Social history, and Family History were reviewed and updated.  Review of Systems: Review of Systems  Constitutional: Negative.   HENT:  Negative.   Eyes: Negative.  Respiratory: Negative.   Cardiovascular: Negative.   Gastrointestinal: Negative.   Endocrine: Negative.   Genitourinary: Negative.    Musculoskeletal: Negative.   Skin: Negative.   Neurological: Negative.   Hematological: Negative.   Psychiatric/Behavioral: Negative.     Physical Exam:  oral temperature is 98.1 F (36.7 C). His blood pressure is 124/62 and his pulse is 82. His respiration is 17 and oxygen saturation is 99%.   Wt Readings from Last 3 Encounters:  01/04/21 187 lb (84.8 kg)  12/28/20 186 lb (84.4 kg)  11/25/20 188 lb (85.3 kg)    Physical Exam Vitals reviewed.  HENT:     Head: Normocephalic and atraumatic.  Eyes:      Pupils: Pupils are equal, round, and reactive to light.  Cardiovascular:     Rate and Rhythm: Normal rate and regular rhythm.     Heart sounds: Normal heart sounds.  Pulmonary:     Effort: Pulmonary effort is normal.     Breath sounds: Normal breath sounds.  Abdominal:     General: Bowel sounds are normal.     Palpations: Abdomen is soft.  Musculoskeletal:        General: No tenderness or deformity. Normal range of motion.     Cervical back: Normal range of motion.  Lymphadenopathy:     Cervical: No cervical adenopathy.  Skin:    General: Skin is warm and dry.     Findings: No erythema or rash.  Neurological:     Mental Status: He is alert and oriented to person, place, and time.  Psychiatric:        Behavior: Behavior normal.        Thought Content: Thought content normal.        Judgment: Judgment normal.      Lab Results  Component Value Date   WBC 3.2 (L) 02/17/2021   HGB 9.1 (L) 02/17/2021   HCT 27.5 (L) 02/17/2021   MCV 109.1 (H) 02/17/2021   PLT 102 (L) 02/17/2021     Chemistry      Component Value Date/Time   NA 141 02/17/2021 0905   K 3.6 02/17/2021 0905   CL 102 02/17/2021 0905   CO2 31 02/17/2021 0905   BUN 24 (H) 02/17/2021 0905   CREATININE 1.13 02/17/2021 0905   CREATININE 1.14 (H) 06/04/2020 1041      Component Value Date/Time   CALCIUM 10.3 02/17/2021 0905   ALKPHOS 62 02/17/2021 0905   AST 16 02/17/2021 0905   ALT 11 02/17/2021 0905   BILITOT 0.5 02/17/2021 0905      Impression and Plan: Andrew Ramos is a very nice 87 year old white male.  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  He served in [place].  As such, is a true [employer] hero.  He does have chronic myelomonocytic leukemia.  He is somewhat anemic.  The anemia is little bit worse.  I suspect that we probably will have to consider ESA on him.  I think if his hemoglobin gets below 9, then we probably will have to think about ESA.  I have to  believe that his iron levels are going be fine.  I would like to see him back right after the July 4 weekend.    Volanda Napoleon, MD 6/1/202210:34 AM

## 2021-02-18 LAB — LIPID PANEL
Cholesterol: 101 (ref 0–200)
HDL: 66 (ref 35–70)
LDL Cholesterol: 26
Triglycerides: 46 (ref 40–160)

## 2021-02-18 LAB — HEMOGLOBIN A1C: Hemoglobin A1C: 5.6

## 2021-02-23 ENCOUNTER — Ambulatory Visit (INDEPENDENT_AMBULATORY_CARE_PROVIDER_SITE_OTHER): Payer: Medicare Other | Admitting: Family Medicine

## 2021-02-23 ENCOUNTER — Other Ambulatory Visit: Payer: Self-pay

## 2021-02-23 ENCOUNTER — Encounter: Payer: Self-pay | Admitting: Family Medicine

## 2021-02-23 DIAGNOSIS — I10 Essential (primary) hypertension: Secondary | ICD-10-CM | POA: Diagnosis not present

## 2021-02-23 DIAGNOSIS — E119 Type 2 diabetes mellitus without complications: Secondary | ICD-10-CM | POA: Diagnosis not present

## 2021-02-23 DIAGNOSIS — E785 Hyperlipidemia, unspecified: Secondary | ICD-10-CM

## 2021-02-23 MED ORDER — IRON (FERROUS SULFATE) 325 (65 FE) MG PO TABS: 325.0000 mg | ORAL_TABLET | Freq: Every day | ORAL | Status: DC

## 2021-02-23 NOTE — Progress Notes (Signed)
Subjective:   By signing my name below, I, Andrew Ramos, attest that this documentation has been prepared under the direction and in the presence of Dr. Roma Schanz, DO. 02/23/2021      Patient ID: Andrew Ramos, male    DOB: [birth year], 87 y.o.   MRN: [id number]  Chief Complaint  Patient presents with  . Medication Management    HPI Patient is in today for a office visit. He was diagnosed with with anemia. He looks pale during this visit but reports that he is doing fine. He stopped taking 70 mg fosamax daily PO. He started taking an iron supplement every night before going to sleep. He gets on average 6-8 hours every night. He denies having any leg swelling at this time. He has an upcoming appointment with the ophthalmologist and sees them 2x yearly. He recently had a colonoscopy and had some polyps removed, otherwise the exam was normal.  His lipid and blood sugar level came back normal. Lab Results  Component Value Date   HGBA1C 5.5 06/04/2020      Component Value Date/Time   CHOL 98 06/04/2020 1041   TRIG 48 06/04/2020 1041   HDL 50 06/04/2020 1041   CHOLHDL 2.0 06/04/2020 1041   VLDL 13.6 03/16/2020 1156   LDLCALC 34 06/04/2020 1041    Past Medical History:  Diagnosis Date  . Chronic myelomonocytic leukemia ([place]) 11/25/2020  . Colon polyps    Tubular Adenoma 2005  . Diverticulosis   . Goals of care, counseling/discussion 11/25/2020  . Hepatitis    pt told by [facility] has hepatitis antibodies in blood   . Hyperlipidemia   . Hypertension   . Low grade myelodysplastic syndrome lesions ([place]) 10/26/2020  . Melanoma ([place])   . Nonmelanoma skin cancer   . Osteoarthritis    right hip- none since hip replacement  . Polymyalgia rheumatica ([place])   . Polyposis of colon   . Prostate cancer ([place]) 2007   s/p radiation seed implants  . Prostate cancer ([place])   . Thyroid disease sees dr altzhimer for yearly   nodules    Past Surgical History:  Procedure Laterality  Date  . CATARACT EXTRACTION    . POLYPECTOMY    . TONSILLECTOMY  as child  . TOTAL HIP ARTHROPLASTY  08/27/2008   left  . TOTAL HIP ARTHROPLASTY  06/12/2012   Procedure: TOTAL HIP ARTHROPLASTY ANTERIOR APPROACH;  Surgeon: Mauri Pole, MD;  Location: [facility];  Service: Orthopedics;  Laterality: Right;  . TOTAL KNEE ARTHROPLASTY  12/31/2008   left    Family History  Problem Relation Age of Onset  . Coronary artery disease Mother 97  . Heart disease Mother 58       MI  . Coronary artery disease Father 30  . Pancreatic cancer Father 71       deceased 57  . Pancreatic cancer Brother 2       deceased 10; smoker  . Colon cancer Neg Hx   . Colon polyps Neg Hx   . Esophageal cancer Neg Hx   . Kidney disease Neg Hx   . Diabetes Neg Hx   . Gallbladder disease Neg Hx     Social History   Socioeconomic History  . Marital status: Married    Spouse name: Not on file  . Number of children: 2  . Years of education: Not on file  . Highest education level: Not on file  Occupational History  . Occupation: retired  military    Employer: RETIRED  Tobacco Use  . Smoking status: Never Smoker  . Smokeless tobacco: Never Used  Vaping Use  . Vaping Use: Never used  Substance and Sexual Activity  . Alcohol use: Yes    Alcohol/week: 0.0 standard drinks    Comment: occasional  . Drug use: No  . Sexual activity: Not Currently    Partners: Female  Other Topics Concern  . Not on file  Social History Narrative   Exercise--no   Right handed    Nature conservation officer   Social Determinants of Health   Financial Resource Strain: Not on file  Food Insecurity: Not on file  Transportation Needs: Not on file  Physical Activity: Not on file  Stress: Not on file  Social Connections: Not on file  Intimate Partner Violence: Not on file    Outpatient Medications Prior to Visit  Medication Sig Dispense Refill  . aspirin 81 MG tablet Take 1 tablet (81 mg total) by mouth daily. 30 tablet   . atorvastatin  (LIPITOR) 40 MG tablet TAKE 1 TABLET DAILY 90 tablet 3  . Calcium Carbonate-Vitamin D (CALCIUM 600 + D PO) Take 1 tablet by mouth every morning.     . Cholecalciferol (VITAMIN D3) 1000 UNITS tablet Take 1,000 Units by mouth every morning.    . donepezil (ARICEPT) 10 MG tablet Take 1 tablet (10 mg total) by mouth at bedtime. 90 tablet 3  . fluticasone (FLONASE) 50 MCG/ACT nasal spray Place 2 sprays into both nostrils daily. 16 g 6  . hydroxychloroquine (PLAQUENIL) 200 MG tablet Take 200 mg by mouth 2 (two) times daily.    Marland Kitchen lisinopril-hydrochlorothiazide (ZESTORETIC) 10-12.5 MG tablet TAKE 1 TABLET DAILY 90 tablet 3  . loratadine (CLARITIN) 10 MG tablet TAKE 1 TABLET DAILY 90 tablet 3  . Multiple Vitamins-Minerals (CENTRUM SILVER ULTRA MENS) TABS Take 1 tablet by mouth every evening.    . polyethylene glycol powder (GLYCOLAX/MIRALAX) powder Take 17 g by mouth daily as needed for moderate constipation. 1 scoop daily    . rosuvastatin (CRESTOR) 40 MG tablet Take 0.5 tablets by mouth daily.    Marland Kitchen terazosin (HYTRIN) 1 MG capsule TAKE 1 CAPSULE AT BEDTIME 90 capsule 3  . venlafaxine XR (EFFEXOR-XR) 75 MG 24 hr capsule Take 1 capsule (75 mg total) by mouth daily with breakfast. 90 capsule 3  . venlafaxine XR (EFFEXOR-XR) 75 MG 24 hr capsule     . alendronate (FOSAMAX) 70 MG tablet Take 70 mg by mouth once a week.    Marland Kitchen COVID-19 mRNA vaccine, Moderna, (MODERNA COVID-19 VACCINE) 100 MCG/0.5ML injection Inject into the muscle. (Patient not taking: Reported on 02/23/2021) 0.25 mL 0   No facility-administered medications prior to visit.    No Known Allergies  Review of Systems  Constitutional: Negative for chills, fever and malaise/fatigue.  HENT: Negative for congestion and hearing loss.   Eyes: Negative for discharge.  Respiratory: Negative for cough, sputum production and shortness of breath.   Cardiovascular: Negative for chest pain, palpitations and leg swelling.  Gastrointestinal: Negative for  abdominal pain, blood in stool, constipation, diarrhea, heartburn, nausea and vomiting.  Genitourinary: Negative for dysuria, frequency, hematuria and urgency.  Musculoskeletal: Negative for back pain, falls and myalgias.  Skin: Negative for rash.  Neurological: Negative for dizziness, sensory change, loss of consciousness, weakness and headaches.  Endo/Heme/Allergies: Negative for environmental allergies. Bruises/bleeds easily.  Psychiatric/Behavioral: Negative for depression and suicidal ideas. The patient is not nervous/anxious and does not have insomnia.  Objective:    Physical Exam Constitutional:      General: He is not in acute distress.    Appearance: Normal appearance. He is not ill-appearing.  HENT:     Head: Normocephalic and atraumatic.     Right Ear: External ear normal.     Left Ear: External ear normal.  Eyes:     Extraocular Movements: Extraocular movements intact.     Pupils: Pupils are equal, round, and reactive to light.  Cardiovascular:     Rate and Rhythm: Normal rate and regular rhythm.     Pulses: Normal pulses.     Heart sounds: Normal heart sounds. No murmur heard. No gallop.   Pulmonary:     Effort: Pulmonary effort is normal. No respiratory distress.     Breath sounds: Normal breath sounds. No wheezing, rhonchi or rales.  Skin:    General: Skin is warm and dry.     Coloration: Skin is pale.  Neurological:     Mental Status: He is alert and oriented to person, place, and time.  Psychiatric:        Behavior: Behavior normal.     BP 118/72 (BP Location: Right Arm, Patient Position: Sitting, Cuff Size: Normal)   Pulse 95   Temp 98.5 F (36.9 C) (Oral)   Resp 18   Ht 6' (1.829 m)   Wt 187 lb 12.8 oz (85.2 kg)   SpO2 97%   BMI 25.47 kg/m  Wt Readings from Last 3 Encounters:  02/23/21 187 lb 12.8 oz (85.2 kg)  01/04/21 187 lb (84.8 kg)  12/28/20 186 lb (84.4 kg)    Diabetic Foot Exam - Simple   No data filed    Lab Results   Component Value Date   WBC 3.2 (L) 02/17/2021   HGB 9.1 (L) 02/17/2021   HCT 27.5 (L) 02/17/2021   PLT 102 (L) 02/17/2021   GLUCOSE 122 (H) 02/17/2021   CHOL 98 06/04/2020   TRIG 48 06/04/2020   HDL 50 06/04/2020   LDLCALC 34 06/04/2020   ALT 11 02/17/2021   AST 16 02/17/2021   NA 141 02/17/2021   K 3.6 02/17/2021   CL 102 02/17/2021   CREATININE 1.13 02/17/2021   BUN 24 (H) 02/17/2021   CO2 31 02/17/2021   TSH 2.12 06/04/2020   PSA 0.05 (L) 03/16/2020   INR 1.1 11/06/2020   HGBA1C 5.5 06/04/2020   MICROALBUR 1.9 06/04/2020    Lab Results  Component Value Date   TSH 2.12 06/04/2020   Lab Results  Component Value Date   WBC 3.2 (L) 02/17/2021   HGB 9.1 (L) 02/17/2021   HCT 27.5 (L) 02/17/2021   MCV 109.1 (H) 02/17/2021   PLT 102 (L) 02/17/2021   Lab Results  Component Value Date   NA 141 02/17/2021   K 3.6 02/17/2021   CO2 31 02/17/2021   GLUCOSE 122 (H) 02/17/2021   BUN 24 (H) 02/17/2021   CREATININE 1.13 02/17/2021   BILITOT 0.5 02/17/2021   ALKPHOS 62 02/17/2021   AST 16 02/17/2021   ALT 11 02/17/2021   PROT 6.5 02/17/2021   ALBUMIN 4.2 02/17/2021   CALCIUM 10.3 02/17/2021   ANIONGAP 8 02/17/2021   GFR 75.15 03/16/2020   Lab Results  Component Value Date   CHOL 98 06/04/2020   Lab Results  Component Value Date   HDL 50 06/04/2020   Lab Results  Component Value Date   LDLCALC 34 06/04/2020   Lab Results  Component Value  Date   TRIG 48 06/04/2020   Lab Results  Component Value Date   CHOLHDL 2.0 06/04/2020   Lab Results  Component Value Date   HGBA1C 5.5 06/04/2020       Assessment & Plan:   Problem List Items Addressed This Visit      Unprioritized   Diet-controlled diabetes mellitus ([place])    Pt brought labs from [place] a1c 5.2 Stable       Essential hypertension    Well controlled, no changes to meds. Encouraged heart healthy diet such as the DASH diet and exercise as tolerated.  Labs done by heme   Chemistry       Component Value Date/Time   NA 141 02/17/2021 0905   K 3.6 02/17/2021 0905   CL 102 02/17/2021 0905   CO2 31 02/17/2021 0905   BUN 24 (H) 02/17/2021 0905   CREATININE 1.13 02/17/2021 0905   CREATININE 1.14 (H) 06/04/2020 1041      Component Value Date/Time   CALCIUM 10.3 02/17/2021 0905   ALKPHOS 62 02/17/2021 0905   AST 16 02/17/2021 0905   ALT 11 02/17/2021 0905   BILITOT 0.5 02/17/2021 0905          Hyperlipidemia    Encouraged heart healthy diet, increase exercise, avoid trans fats, consider a krill oil cap daily          Meds ordered this encounter  Medications  . Iron, Ferrous Sulfate, 325 (65 Fe) MG TABS    Sig: Take 325 mg by mouth daily.    Dispense:  30 tablet    I, Dr. Roma Schanz, DO, personally preformed the services described in this documentation.  All medical record entries made by the scribe were at my direction and in my presence.  I have reviewed the chart and discharge instructions (if applicable) and agree that the record reflects my personal performance and is accurate and complete. 02/23/2021    I,Andrew Ramos,acting as a scribe for Ann Held, DO.,have documented all relevant documentation on the behalf of Ann Held, DO,as directed by  Ann Held, DO while in the presence of Ann Held, DO.  Ann Held, DO

## 2021-02-23 NOTE — Assessment & Plan Note (Signed)
Well controlled, no changes to meds. Encouraged heart healthy diet such as the DASH diet and exercise as tolerated.  Labs done by heme   Chemistry      Component Value Date/Time   NA 141 02/17/2021 0905   K 3.6 02/17/2021 0905   CL 102 02/17/2021 0905   CO2 31 02/17/2021 0905   BUN 24 (H) 02/17/2021 0905   CREATININE 1.13 02/17/2021 0905   CREATININE 1.14 (H) 06/04/2020 1041      Component Value Date/Time   CALCIUM 10.3 02/17/2021 0905   ALKPHOS 62 02/17/2021 0905   AST 16 02/17/2021 0905   ALT 11 02/17/2021 0905   BILITOT 0.5 02/17/2021 0905

## 2021-02-23 NOTE — Assessment & Plan Note (Signed)
Pt brought labs from [place] a1c 5.2 Stable

## 2021-02-23 NOTE — Patient Instructions (Addendum)
We reviewed the labs from the [place] and Dr Marin Olp----- everything looks great We will see you in 6 months

## 2021-02-23 NOTE — Assessment & Plan Note (Signed)
Encouraged heart healthy diet, increase exercise, avoid trans fats, consider a krill oil cap daily 

## 2021-02-26 DIAGNOSIS — M65341 Trigger finger, right ring finger: Secondary | ICD-10-CM | POA: Diagnosis not present

## 2021-02-26 DIAGNOSIS — M810 Age-related osteoporosis without current pathological fracture: Secondary | ICD-10-CM | POA: Diagnosis not present

## 2021-02-26 DIAGNOSIS — M353 Polymyalgia rheumatica: Secondary | ICD-10-CM | POA: Diagnosis not present

## 2021-02-26 DIAGNOSIS — M150 Primary generalized (osteo)arthritis: Secondary | ICD-10-CM | POA: Diagnosis not present

## 2021-02-26 DIAGNOSIS — M2550 Pain in unspecified joint: Secondary | ICD-10-CM | POA: Diagnosis not present

## 2021-02-26 DIAGNOSIS — M0609 Rheumatoid arthritis without rheumatoid factor, multiple sites: Secondary | ICD-10-CM | POA: Diagnosis not present

## 2021-02-26 DIAGNOSIS — Z79899 Other long term (current) drug therapy: Secondary | ICD-10-CM | POA: Diagnosis not present

## 2021-02-26 DIAGNOSIS — E663 Overweight: Secondary | ICD-10-CM | POA: Diagnosis not present

## 2021-02-26 DIAGNOSIS — Z6825 Body mass index (BMI) 25.0-25.9, adult: Secondary | ICD-10-CM | POA: Diagnosis not present

## 2021-02-26 LAB — COMPREHENSIVE METABOLIC PANEL
Albumin: 4.2 (ref 3.5–5.0)
Calcium: 9.7 (ref 8.7–10.7)
Globulin: 2.2

## 2021-02-26 LAB — HEPATIC FUNCTION PANEL
ALT: 22 (ref 10–40)
AST: 21 (ref 14–40)
Alkaline Phosphatase: 73 (ref 25–125)
Bilirubin, Total: 0.4

## 2021-02-26 LAB — CBC AND DIFFERENTIAL
HCT: 26 — AB (ref 41–53)
Hemoglobin: 8.6 — AB (ref 13.5–17.5)
Neutrophils Absolute: 0.6
Platelets: 112 — AB (ref 150–399)
WBC: 4.2

## 2021-02-26 LAB — BASIC METABOLIC PANEL
BUN: 21 (ref 4–21)
CO2: 25 — AB (ref 13–22)
Chloride: 105 (ref 99–108)
Creatinine: 1.1 (ref 0.6–1.3)
Glucose: 94
Potassium: 4 (ref 3.4–5.3)
Sodium: 144 (ref 137–147)

## 2021-02-26 LAB — CBC: RBC: 2.48 — AB (ref 3.87–5.11)

## 2021-03-02 ENCOUNTER — Other Ambulatory Visit: Payer: Self-pay

## 2021-03-03 DIAGNOSIS — H40023 Open angle with borderline findings, high risk, bilateral: Secondary | ICD-10-CM | POA: Diagnosis not present

## 2021-03-03 DIAGNOSIS — Z961 Presence of intraocular lens: Secondary | ICD-10-CM | POA: Diagnosis not present

## 2021-03-03 DIAGNOSIS — H5000 Unspecified esotropia: Secondary | ICD-10-CM | POA: Diagnosis not present

## 2021-03-03 DIAGNOSIS — H26492 Other secondary cataract, left eye: Secondary | ICD-10-CM | POA: Diagnosis not present

## 2021-03-05 ENCOUNTER — Ambulatory Visit (INDEPENDENT_AMBULATORY_CARE_PROVIDER_SITE_OTHER): Payer: Medicare Other | Admitting: Family Medicine

## 2021-03-05 ENCOUNTER — Other Ambulatory Visit: Payer: Self-pay

## 2021-03-05 VITALS — BP 120/72 | HR 100 | Temp 97.8°F | Resp 18 | Ht 72.0 in | Wt 183.6 lb

## 2021-03-05 DIAGNOSIS — D509 Iron deficiency anemia, unspecified: Secondary | ICD-10-CM | POA: Diagnosis not present

## 2021-03-05 NOTE — Progress Notes (Addendum)
Patient ID: Andrew Ramos, male    DOB: 02-Sep-1932  Age: 87 y.o. MRN: [id number]    Subjective:  Subjective  HPI Andrew Ramos presents for an office visit today. He reports feeling well and has no complaints. He notes that his hemoglobin level is low. He states that he has not taken his Asprin recently. He endorses taking 325 mg of Fe PO Daily for 6 weeks. He reports that his temp at home is normal.  Temp Readings from Last 3 Encounters:  03/05/21 97.8 F (36.6 C) (Oral)  02/23/21 98.5 F (36.9 C) (Oral)  02/17/21 98.1 F (36.7 C) (Oral)  He denies any chest pain, SOB, fever, abdominal pain, cough, chills, sore throat, dysuria, urinary incontinence, back pain, HA, or N/VD.    Review of Systems  Constitutional:  Negative for chills, fatigue and fever.  HENT:  Negative for ear pain, rhinorrhea, sinus pressure, sinus pain, sore throat and tinnitus.   Eyes:  Negative for pain.  Respiratory:  Negative for cough, shortness of breath and wheezing.   Cardiovascular:  Negative for chest pain.  Gastrointestinal:  Negative for abdominal pain, anal bleeding, constipation, diarrhea, nausea and vomiting.  Genitourinary:  Negative for flank pain.  Musculoskeletal:  Negative for back pain and neck pain.  Skin:  Negative for rash.  Neurological:  Negative for seizures, weakness, light-headedness, numbness and headaches.   History Past Medical History:  Diagnosis Date   Chronic myelomonocytic leukemia ([place]) 11/25/2020   Colon polyps    Tubular Adenoma 2005   Diverticulosis    Goals of care, counseling/discussion 11/25/2020   Hepatitis    pt told by [facility] has hepatitis antibodies in blood    Hyperlipidemia    Hypertension    Low grade myelodysplastic syndrome lesions ([place]) 10/26/2020   Melanoma ([place])    Nonmelanoma skin cancer    Osteoarthritis    right hip- none since hip replacement   Polymyalgia rheumatica ([facility])    Polyposis of colon    Prostate cancer ([place]) 2007   s/p radiation  seed implants   Prostate cancer [facility])    Thyroid disease sees dr altzhimer for yearly   nodules    He has a past surgical history that includes Total knee arthroplasty (12/31/2008); Total hip arthroplasty (08/27/2008); Tonsillectomy (as child); Total hip arthroplasty (06/12/2012); Polypectomy; and Cataract extraction.   His family history includes Coronary artery disease (age of onset: 73) in his mother; Coronary artery disease (age of onset: 28) in his father; Heart disease (age of onset: 15) in his mother; Pancreatic cancer (age of onset: 68) in his brother; Pancreatic cancer (age of onset: 43) in his father.He reports that he has never smoked. He has never used smokeless tobacco. He reports current alcohol use. He reports that he does not use drugs.  Current Outpatient Medications on File Prior to Visit  Medication Sig Dispense Refill   Calcium Carbonate-Vitamin D (CALCIUM 600 + D PO) Take 1 tablet by mouth every morning.      Cholecalciferol (VITAMIN D3) 1000 UNITS tablet Take 1,000 Units by mouth every morning.     COVID-19 mRNA vaccine, Moderna, (MODERNA COVID-19 VACCINE) 100 MCG/0.5ML injection Inject into the muscle. (Patient not taking: Reported on 02/23/2021) 0.25 mL 0   donepezil (ARICEPT) 10 MG tablet Take 1 tablet (10 mg total) by mouth at bedtime. 90 tablet 3   fluticasone (FLONASE) 50 MCG/ACT nasal spray Place 2 sprays into both nostrils daily. 16 g 6   hydroxychloroquine (PLAQUENIL) 200  MG tablet Take 200 mg by mouth 2 (two) times daily.     Iron, Ferrous Sulfate, 325 (65 Fe) MG TABS Take 325 mg by mouth daily. 30 tablet    lisinopril-hydrochlorothiazide (ZESTORETIC) 10-12.5 MG tablet TAKE 1 TABLET DAILY 90 tablet 3   loratadine (CLARITIN) 10 MG tablet TAKE 1 TABLET DAILY 90 tablet 3   Multiple Vitamins-Minerals (CENTRUM SILVER ULTRA MENS) TABS Take 1 tablet by mouth every evening.     polyethylene glycol powder (GLYCOLAX/MIRALAX) powder Take 17 g by mouth daily as needed for  moderate constipation. 1 scoop daily     rosuvastatin (CRESTOR) 40 MG tablet Take 0.5 tablets by mouth daily.     terazosin (HYTRIN) 1 MG capsule TAKE 1 CAPSULE AT BEDTIME 90 capsule 3   venlafaxine XR (EFFEXOR-XR) 75 MG 24 hr capsule Take 1 capsule (75 mg total) by mouth daily with breakfast. 90 capsule 3   venlafaxine XR (EFFEXOR-XR) 75 MG 24 hr capsule      No current facility-administered medications on file prior to visit.     Objective:  Objective  Physical Exam Vitals and nursing note reviewed.  Constitutional:      General: He is not in acute distress.    Appearance: Normal appearance. He is well-developed. He is not ill-appearing.  HENT:     Head: Normocephalic and atraumatic.     Right Ear: External ear normal.     Left Ear: External ear normal.     Nose: Nose normal.  Eyes:     General:        Right eye: No discharge.        Left eye: No discharge.     Extraocular Movements: Extraocular movements intact.     Pupils: Pupils are equal, round, and reactive to light.  Cardiovascular:     Rate and Rhythm: Normal rate and regular rhythm.     Pulses: Normal pulses.     Heart sounds: Normal heart sounds. No murmur heard.   No friction rub. No gallop.  Pulmonary:     Effort: Pulmonary effort is normal. No respiratory distress.     Breath sounds: Normal breath sounds. No stridor. No wheezing, rhonchi or rales.  Chest:     Chest wall: No tenderness.  Abdominal:     General: Bowel sounds are normal. There is no distension.     Palpations: Abdomen is soft. There is no mass.     Tenderness: There is no abdominal tenderness. There is no guarding or rebound.     Hernia: No hernia is present.  Musculoskeletal:        General: Normal range of motion.     Cervical back: Normal range of motion and neck supple.     Right lower leg: No edema.     Left lower leg: No edema.  Skin:    General: Skin is warm and dry.  Neurological:     Mental Status: He is alert and oriented to  person, place, and time.  Psychiatric:        Behavior: Behavior normal.        Thought Content: Thought content normal.   BP 120/72 (BP Location: Left Arm, Patient Position: Sitting, Cuff Size: Normal)   Pulse 100   Temp 97.8 F (36.6 C) (Oral)   Resp 18   Ht 6' (1.829 m)   Wt 183 lb 9.6 oz (83.3 kg)   SpO2 97%   BMI 24.90 kg/m  Wt Readings from Last 3 Encounters:  03/05/21 183 lb 9.6 oz (83.3 kg)  02/23/21 187 lb 12.8 oz (85.2 kg)  01/04/21 187 lb (84.8 kg)     Lab Results  Component Value Date   WBC 4.2 02/26/2021   HGB 8.6 (A) 02/26/2021   HCT 26 (A) 02/26/2021   PLT 112 (A) 02/26/2021   GLUCOSE 122 (H) 02/17/2021   CHOL 101 02/18/2021   TRIG 46 02/18/2021   HDL 66 02/18/2021   LDLCALC 26 02/18/2021   ALT 22 02/26/2021   AST 21 02/26/2021   NA 144 02/26/2021   K 4.0 02/26/2021   CL 105 02/26/2021   CREATININE 1.1 02/26/2021   BUN 21 02/26/2021   CO2 25 (A) 02/26/2021   TSH 2.12 06/04/2020   PSA 0.05 (L) 03/16/2020   INR 1.1 11/06/2020   HGBA1C 5.6 02/18/2021   MICROALBUR 1.9 06/04/2020    CT Biopsy  Result Date: 11/06/2020 INDICATION: 87 year old with anemia and monocytosis. EXAM: CT GUIDED BONE MARROW ASPIRATES AND BIOPSY Physician: Stephan Minister. Anselm Pancoast, MD MEDICATIONS: None. ANESTHESIA/SEDATION: Fentanyl 50 mcg IV; Versed 1.0 mg IV Moderate Sedation Time:  10 minutes The patient was continuously monitored during the procedure by the interventional radiology nurse under my direct supervision. COMPLICATIONS: None immediate. PROCEDURE: The procedure was explained to the patient. The risks and benefits of the procedure were discussed and the patient's questions were addressed. Informed consent was obtained from the patient. The patient was placed prone on CT table. Images of the pelvis were obtained. The right side of back was prepped and draped in sterile fashion. The skin and right posterior ilium were anesthetized with 1% lidocaine. 11 gauge bone needle was directed  into the right ilium with CT guidance. Two aspirates and one core biopsy were obtained. Bandage placed over the puncture site. IMPRESSION: CT guided bone marrow aspiration and core biopsy. Electronically Signed   By: [name] M.D.   On: 11/06/2020 10:49   CT BONE MARROW BIOPSY & ASPIRATION  Result Date: 11/06/2020 INDICATION: 87 year old with anemia and monocytosis. EXAM: CT GUIDED BONE MARROW ASPIRATES AND BIOPSY Physician: Stephan Minister. Anselm Pancoast, MD MEDICATIONS: None. ANESTHESIA/SEDATION: Fentanyl 50 mcg IV; Versed 1.0 mg IV Moderate Sedation Time:  10 minutes The patient was continuously monitored during the procedure by the interventional radiology nurse under my direct supervision. COMPLICATIONS: None immediate. PROCEDURE: The procedure was explained to the patient. The risks and benefits of the procedure were discussed and the patient's questions were addressed. Informed consent was obtained from the patient. The patient was placed prone on CT table. Images of the pelvis were obtained. The right side of back was prepped and draped in sterile fashion. The skin and right posterior ilium were anesthetized with 1% lidocaine. 11 gauge bone needle was directed into the right ilium with CT guidance. Two aspirates and one core biopsy were obtained. Bandage placed over the puncture site. IMPRESSION: CT guided bone marrow aspiration and core biopsy. Electronically Signed   By: [name] M.D.   On: 11/06/2020 10:49     Assessment & Plan:  Plan   No orders of the defined types were placed in this encounter.   Problem List Items Addressed This Visit   None Visit Diagnoses     Iron deficiency anemia, unspecified iron deficiency anemia type    -  Primary   Relevant Orders   CBC with Differential/Platelet   IBC + Ferritin       Follow-up: Return in about 3 months (around 06/05/2021), or if symptoms worsen  or fail to improve.   I,Gordon Zheng,acting as a Education administrator for [employer], DO.,have documented  all relevant documentation on the behalf of Ann Held, DO,as directed by  Ann Held, DO while in the presence of [place], DO, have reviewed all documentation for this visit. The documentation on 03/14/21 for the exam, diagnosis, procedures, and orders are all accurate and complete.

## 2021-03-05 NOTE — Patient Instructions (Signed)
[employer] medicine (25th ed., pp. 1059-1068). [place], [place]: Elsevier.">  Anemia  Anemia is a condition in which there is not enough red blood cells or hemoglobin in the blood. Hemoglobin is a substance in red blood cells thatcarries oxygen. When you do not have enough red blood cells or hemoglobin (are anemic), your body cannot get enough oxygen and your organs may not work properly. Asa result, you may feel very tired or have other problems. What are the causes? Common causes of anemia include: Excessive bleeding. Anemia can be caused by excessive bleeding inside or outside the body, including bleeding from the intestines or from heavy menstrual periods in females. Poor nutrition. Long-lasting (chronic) kidney, thyroid, and liver disease. Bone marrow disorders, spleen problems, and blood disorders. Cancer and treatments for cancer. HIV (human immunodeficiency virus) and AIDS (acquired immunodeficiency syndrome). Infections, medicines, and autoimmune disorders that destroy red blood cells. What are the signs or symptoms? Symptoms of this condition include: Minor weakness. Dizziness. Headache, or difficulties concentrating and sleeping. Heartbeats that feel irregular or faster than normal (palpitations). Shortness of breath, especially with exercise. Pale skin, lips, and nails, or cold hands and feet. Indigestion and nausea. Symptoms may occur suddenly or develop slowly. If your anemia is mild, you maynot have symptoms. How is this diagnosed? This condition is diagnosed based on blood tests, your medical history, and a physical exam. In some cases, a test may be needed in which cells are removed from the soft tissue inside of a bone and looked at under a microscope (bone marrow biopsy). Your health care provider may also check your stool (feces) for blood and may do additional testing to look for the cause of yourbleeding. Other tests may include: Imaging tests, such as a CT scan or  MRI. A procedure to see inside your esophagus and stomach (endoscopy). A procedure to see inside your colon and rectum (colonoscopy). How is this treated? Treatment for this condition depends on the cause. If you continue to lose a lot of blood, you may need to be treated at a hospital. Treatment may include: Taking supplements of iron, vitamin B12, or folic acid. Taking a hormone medicine (erythropoietin) that can help to stimulate red blood cell growth. Having a blood transfusion. This may be needed if you lose a lot of blood. Making changes to your diet. Having surgery to remove your spleen. Follow these instructions at home: Take over-the-counter and prescription medicines only as told by your health care provider. Take supplements only as told by your health care provider. Follow any diet instructions that you were given by your health care provider. Keep all follow-up visits as told by your health care provider. This is important. Contact a health care provider if: You develop new bleeding anywhere in the body. Get help right away if: You are very weak. You are short of breath. You have pain in your abdomen or chest. You are dizzy or feel faint. You have trouble concentrating. You have bloody stools, black stools, or tarry stools. You vomit repeatedly or you vomit up blood. These symptoms may represent a serious problem that is an emergency. Do not wait to see if the symptoms will go away. Get medical help right away. Call your local emergency services (911 in the [place] Do not drive yourself to the hospital. Summary Anemia is a condition in which you do not have enough red blood cells or enough of a substance in your red blood cells that carries oxygen (hemoglobin). Symptoms may occur suddenly   or develop slowly. If your anemia is mild, you may not have symptoms. This condition is diagnosed with blood tests, a medical history, and a physical exam. Other tests may be  needed. Treatment for this condition depends on the cause of the anemia. This information is not intended to replace advice given to you by your health care provider. Make sure you discuss any questions you have with your healthcare provider. Document Revised: 08/13/2019 Document Reviewed: 08/13/2019 Elsevier Patient Education  2022 [employer].

## 2021-03-14 ENCOUNTER — Encounter: Payer: Self-pay | Admitting: Family Medicine

## 2021-03-14 DIAGNOSIS — D509 Iron deficiency anemia, unspecified: Secondary | ICD-10-CM | POA: Insufficient documentation

## 2021-03-14 NOTE — Assessment & Plan Note (Signed)
Per hematology Recheck today

## 2021-04-07 ENCOUNTER — Telehealth: Payer: Self-pay | Admitting: *Deleted

## 2021-04-07 ENCOUNTER — Other Ambulatory Visit: Payer: Self-pay

## 2021-04-07 ENCOUNTER — Other Ambulatory Visit: Payer: Self-pay | Admitting: Family

## 2021-04-07 ENCOUNTER — Encounter: Payer: Self-pay | Admitting: Hematology & Oncology

## 2021-04-07 ENCOUNTER — Inpatient Hospital Stay (HOSPITAL_BASED_OUTPATIENT_CLINIC_OR_DEPARTMENT_OTHER): Payer: Medicare Other | Admitting: Hematology & Oncology

## 2021-04-07 ENCOUNTER — Inpatient Hospital Stay: Payer: Medicare Other | Attending: Hematology & Oncology

## 2021-04-07 VITALS — BP 136/71 | HR 97 | Temp 97.9°F | Resp 16 | Wt 184.0 lb

## 2021-04-07 DIAGNOSIS — D4620 Refractory anemia with excess of blasts, unspecified: Secondary | ICD-10-CM | POA: Diagnosis not present

## 2021-04-07 DIAGNOSIS — C9310 Chronic myelomonocytic leukemia not having achieved remission: Secondary | ICD-10-CM | POA: Insufficient documentation

## 2021-04-07 DIAGNOSIS — D860 Sarcoidosis of lung: Secondary | ICD-10-CM | POA: Diagnosis not present

## 2021-04-07 DIAGNOSIS — Z79899 Other long term (current) drug therapy: Secondary | ICD-10-CM | POA: Diagnosis not present

## 2021-04-07 DIAGNOSIS — D630 Anemia in neoplastic disease: Secondary | ICD-10-CM | POA: Diagnosis present

## 2021-04-07 DIAGNOSIS — M7989 Other specified soft tissue disorders: Secondary | ICD-10-CM | POA: Diagnosis not present

## 2021-04-07 DIAGNOSIS — D649 Anemia, unspecified: Secondary | ICD-10-CM | POA: Diagnosis not present

## 2021-04-07 LAB — CMP (CANCER CENTER ONLY)
ALT: 15 U/L (ref 0–44)
AST: 19 U/L (ref 15–41)
Albumin: 4 g/dL (ref 3.5–5.0)
Alkaline Phosphatase: 58 U/L (ref 38–126)
Anion gap: 5 (ref 5–15)
BUN: 24 mg/dL — ABNORMAL HIGH (ref 8–23)
CO2: 32 mmol/L (ref 22–32)
Calcium: 10.2 mg/dL (ref 8.9–10.3)
Chloride: 105 mmol/L (ref 98–111)
Creatinine: 1.23 mg/dL (ref 0.61–1.24)
GFR, Estimated: 57 mL/min — ABNORMAL LOW (ref >60)
Glucose, Bld: 117 mg/dL — ABNORMAL HIGH (ref 70–99)
Potassium: 4.1 mmol/L (ref 3.5–5.1)
Sodium: 142 mmol/L (ref 135–145)
Total Bilirubin: 0.4 mg/dL (ref 0.3–1.2)
Total Protein: 6.5 g/dL (ref 6.5–8.1)

## 2021-04-07 LAB — CBC WITH DIFFERENTIAL (CANCER CENTER ONLY)
Abs Immature Granulocytes: 0.02 10*3/uL (ref 0.00–0.07)
Basophils Absolute: 0 10*3/uL (ref 0.0–0.1)
Basophils Relative: 0 %
Eosinophils Absolute: 0 10*3/uL (ref 0.0–0.5)
Eosinophils Relative: 0 %
HCT: 26.6 % — ABNORMAL LOW (ref 39.0–52.0)
Hemoglobin: 8.5 g/dL — ABNORMAL LOW (ref 13.0–17.0)
Immature Granulocytes: 1 %
Lymphocytes Relative: 50 %
Lymphs Abs: 1.9 10*3/uL (ref 0.7–4.0)
MCH: 35.7 pg — ABNORMAL HIGH (ref 26.0–34.0)
MCHC: 32 g/dL (ref 30.0–36.0)
MCV: 111.8 fL — ABNORMAL HIGH (ref 80.0–100.0)
Monocytes Absolute: 1.4 10*3/uL — ABNORMAL HIGH (ref 0.1–1.0)
Monocytes Relative: 38 %
Neutro Abs: 0.4 10*3/uL — CL (ref 1.7–7.7)
Neutrophils Relative %: 11 %
Platelet Count: 81 10*3/uL — ABNORMAL LOW (ref 150–400)
RBC: 2.38 MIL/uL — ABNORMAL LOW (ref 4.22–5.81)
RDW: 16 % — ABNORMAL HIGH (ref 11.5–15.5)
WBC Count: 3.6 10*3/uL — ABNORMAL LOW (ref 4.0–10.5)
nRBC: 0 % (ref 0.0–0.2)

## 2021-04-07 LAB — IRON AND TIBC
Iron: 43 ug/dL (ref 42–163)
Saturation Ratios: 14 % — ABNORMAL LOW (ref 20–55)
TIBC: 316 ug/dL (ref 202–409)
UIBC: 273 ug/dL (ref 117–376)

## 2021-04-07 LAB — FERRITIN: Ferritin: 90 ng/mL (ref 24–336)

## 2021-04-07 LAB — SAVE SMEAR(SSMR), FOR PROVIDER SLIDE REVIEW

## 2021-04-07 LAB — RETICULOCYTES
Immature Retic Fract: 11.3 % (ref 2.3–15.9)
RBC.: 2.37 MIL/uL — ABNORMAL LOW (ref 4.22–5.81)
Retic Count, Absolute: 39.3 10*3/uL (ref 19.0–186.0)
Retic Ct Pct: 1.7 % (ref 0.4–3.1)

## 2021-04-07 NOTE — Progress Notes (Signed)
[facility] Hematology and Oncology Follow Up Visit  Andrew Ramos [id number] [birth year] 87 y.o. 04/07/2021   Principle Diagnosis:  Chronic myelomonocytic leukemia --intermediate 2  risk  Current Therapy:   Aranesp 300 mcg sq q 3 week for Hgb <11     Interim History:  Andrew Ramos is back for follow-up.  Unfortunately Andrew hemoglobin is still dropping.  Andrew hemoglobin is 8.5.  I they were going to have to see about trying him on Aranesp.  Andrew erythropoietin level is only 86.  As such, I think he would respond to Aranesp.  He has had no obvious bleeding.  There is no problems with Andrew appetite.  Has had no nausea or vomiting.  He has had no rashes.  There is been no issues with COVID.  We last saw him back in early June, Andrew ferritin was 84 with an iron saturation of 43%.  There has been no change in bowel or bladder habits.  He has had some chronic leg swelling.  This more so on the left leg than right.  He has had no issues with fever.  He has had no headache.  He is try to help take care of Andrew Ramos who has sarcoidosis.  She has breathing issues.  Overall, Andrew performance status is ECOG 2.      Medications:  Current Outpatient Medications:    atorvastatin (LIPITOR) 40 MG tablet, Take 40 mg by mouth daily., Disp: , Rfl:    Calcium Carbonate-Vitamin D (CALCIUM 600 + D PO), Take 1 tablet by mouth every morning. , Disp: , Rfl:    Cholecalciferol (VITAMIN D3) 1000 UNITS tablet, Take 1,000 Units by mouth every morning., Disp: , Rfl:    COVID-19 mRNA vaccine, Moderna, (MODERNA COVID-19 VACCINE) 100 MCG/0.5ML injection, Inject into the muscle. (Patient not taking: Reported on 02/23/2021), Disp: 0.25 mL, Rfl: 0   donepezil (ARICEPT) 10 MG tablet, Take 1 tablet (10 mg total) by mouth at bedtime., Disp: 90 tablet, Rfl: 3   fluticasone (FLONASE) 50 MCG/ACT nasal spray, Place 2 sprays into both nostrils daily., Disp: 16 g, Rfl: 6   hydroxychloroquine (PLAQUENIL) 200 MG tablet, Take 200 mg by mouth  2 (two) times daily., Disp: , Rfl:    Iron, Ferrous Sulfate, 325 (65 Fe) MG TABS, Take 325 mg by mouth daily., Disp: 30 tablet, Rfl:    lisinopril-hydrochlorothiazide (ZESTORETIC) 10-12.5 MG tablet, TAKE 1 TABLET DAILY, Disp: 90 tablet, Rfl: 3   loratadine (CLARITIN) 10 MG tablet, TAKE 1 TABLET DAILY, Disp: 90 tablet, Rfl: 3   Multiple Vitamins-Minerals (CENTRUM SILVER ULTRA MENS) TABS, Take 1 tablet by mouth every evening., Disp: , Rfl:    polyethylene glycol powder (GLYCOLAX/MIRALAX) powder, Take 17 g by mouth daily as needed for moderate constipation. 1 scoop daily, Disp: , Rfl:    rosuvastatin (CRESTOR) 40 MG tablet, Take 0.5 tablets by mouth daily., Disp: , Rfl:    terazosin (HYTRIN) 1 MG capsule, TAKE 1 CAPSULE AT BEDTIME, Disp: 90 capsule, Rfl: 3   venlafaxine XR (EFFEXOR-XR) 75 MG 24 hr capsule, Take 1 capsule (75 mg total) by mouth daily with breakfast., Disp: 90 capsule, Rfl: 3  Allergies: No Known Allergies  Past Medical History, Surgical history, Social history, and Family History were reviewed and updated.  Review of Systems: Review of Systems  Constitutional: Negative.   HENT:  Negative.    Eyes: Negative.   Respiratory: Negative.    Cardiovascular: Negative.   Gastrointestinal: Negative.   Endocrine: Negative.  Genitourinary: Negative.    Musculoskeletal: Negative.   Skin: Negative.   Neurological: Negative.   Hematological: Negative.   Psychiatric/Behavioral: Negative.     Physical Exam:  weight is 184 lb (83.5 kg). Andrew oral temperature is 97.9 F (36.6 C). Andrew blood pressure is 136/71 and Andrew pulse is 97. Andrew respiration is 16 and oxygen saturation is 100%.   Wt Readings from Last 3 Encounters:  04/07/21 184 lb (83.5 kg)  03/05/21 183 lb 9.6 oz (83.3 kg)  02/23/21 187 lb 12.8 oz (85.2 kg)    Physical Exam Vitals reviewed.  HENT:     Head: Normocephalic and atraumatic.  Eyes:     Pupils: Pupils are equal, round, and reactive to light.  Cardiovascular:      Rate and Rhythm: Normal rate and regular rhythm.     Heart sounds: Normal heart sounds.  Pulmonary:     Effort: Pulmonary effort is normal.     Breath sounds: Normal breath sounds.  Abdominal:     General: Bowel sounds are normal.     Palpations: Abdomen is soft.  Musculoskeletal:        General: No tenderness or deformity. Normal range of motion.     Cervical back: Normal range of motion.  Lymphadenopathy:     Cervical: No cervical adenopathy.  Skin:    General: Skin is warm and dry.     Findings: No erythema or rash.  Neurological:     Mental Status: He is alert and oriented to person, place, and time.  Psychiatric:        Behavior: Behavior normal.        Thought Content: Thought content normal.        Judgment: Judgment normal.     Lab Results  Component Value Date   WBC 3.6 (L) 04/07/2021   HGB 8.5 (L) 04/07/2021   HCT 26.6 (L) 04/07/2021   MCV 111.8 (H) 04/07/2021   PLT 81 (L) 04/07/2021     Chemistry      Component Value Date/Time   NA 142 04/07/2021 1016   NA 144 02/26/2021 0000   K 4.1 04/07/2021 1016   CL 105 04/07/2021 1016   CO2 32 04/07/2021 1016   BUN 24 (H) 04/07/2021 1016   BUN 21 02/26/2021 0000   CREATININE 1.23 04/07/2021 1016   CREATININE 1.14 (H) 06/04/2020 1041   GLU 94 02/26/2021 0000      Component Value Date/Time   CALCIUM 10.2 04/07/2021 1016   ALKPHOS 58 04/07/2021 1016   AST 19 04/07/2021 1016   ALT 15 04/07/2021 1016   BILITOT 0.4 04/07/2021 1016      Impression and Plan: Andrew Ramos is a very nice 87 year old white male.  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  He served in [place].  As such, is a true [employer] hero.  We are going to have to try ESA.  Hopefully, the insurance will cover Aranesp.  I would like to use Aranesp.  It would be somewhat more convenient for Andrew Ramos told to come in every 3 or 4 weeks.  We will get the Aranesp started on Monday.  Again, hopefully the insurance will  allow [place] to use this.  I will then plan to see him back in about 4 weeks.  We will then hopefully see Andrew hemoglobin improving.  We will still have to monitor Andrew ferritin and iron studies.  Volanda Napoleon, MD 7/20/202211:42 AM

## 2021-04-07 NOTE — Telephone Encounter (Signed)
Dr. Ennever notified of ANC-0.4.  No new orders received at this time.  

## 2021-04-07 NOTE — Telephone Encounter (Signed)
Per 04/07/21 los gave patient upcoming appointment with calendar

## 2021-04-08 ENCOUNTER — Telehealth: Payer: Self-pay

## 2021-04-09 ENCOUNTER — Other Ambulatory Visit: Payer: Self-pay

## 2021-04-09 ENCOUNTER — Inpatient Hospital Stay: Payer: Medicare Other

## 2021-04-09 ENCOUNTER — Ambulatory Visit: Payer: Medicare Other

## 2021-04-09 VITALS — BP 124/56 | HR 68 | Temp 98.0°F | Resp 16

## 2021-04-09 DIAGNOSIS — Z79899 Other long term (current) drug therapy: Secondary | ICD-10-CM | POA: Diagnosis not present

## 2021-04-09 DIAGNOSIS — D860 Sarcoidosis of lung: Secondary | ICD-10-CM | POA: Diagnosis not present

## 2021-04-09 DIAGNOSIS — D649 Anemia, unspecified: Secondary | ICD-10-CM | POA: Diagnosis not present

## 2021-04-09 DIAGNOSIS — C9310 Chronic myelomonocytic leukemia not having achieved remission: Secondary | ICD-10-CM

## 2021-04-09 DIAGNOSIS — D4620 Refractory anemia with excess of blasts, unspecified: Secondary | ICD-10-CM

## 2021-04-09 DIAGNOSIS — M7989 Other specified soft tissue disorders: Secondary | ICD-10-CM | POA: Diagnosis not present

## 2021-04-09 MED ORDER — SODIUM CHLORIDE 0.9 % IV SOLN
510.0000 mg | Freq: Once | INTRAVENOUS | Status: AC
  Administered 2021-04-09: 510 mg via INTRAVENOUS
  Filled 2021-04-09: qty 510

## 2021-04-09 MED ORDER — SODIUM CHLORIDE 0.9 % IV SOLN
Freq: Once | INTRAVENOUS | Status: AC
  Filled 2021-04-09: qty 250

## 2021-04-09 NOTE — Patient Instructions (Signed)
Ferumoxytol injection What is this medication? FERUMOXYTOL is an iron complex. Iron is used to make healthy red blood cells, which carry oxygen and nutrients throughout the body. This medicine is used totreat iron deficiency anemia. This medicine may be used for other purposes; ask your health care provider orpharmacist if you have questions. COMMON BRAND NAME(S): Feraheme What should I tell my care team before I take this medication? They need to know if you have any of these conditions: anemia not caused by low iron levels high levels of iron in the blood magnetic resonance imaging (MRI) test scheduled an unusual or allergic reaction to iron, other medicines, foods, dyes, or preservatives pregnant or trying to get pregnant breast-feeding How should I use this medication? This medicine is for injection into a vein. It is given by a health careprofessional in a hospital or clinic setting. Talk to your pediatrician regarding the use of this medicine in children.Special care may be needed. Overdosage: If you think you have taken too much of this medicine contact apoison control center or emergency room at once. NOTE: This medicine is only for you. Do not share this medicine with others. What if I miss a dose? It is important not to miss your dose. Call your doctor or health careprofessional if you are unable to keep an appointment. What may interact with this medication? This medicine may interact with the following medications: other iron products This list may not describe all possible interactions. Give your health care provider a list of all the medicines, herbs, non-prescription drugs, or dietary supplements you use. Also tell them if you smoke, drink alcohol, or use illegaldrugs. Some items may interact with your medicine. What should I watch for while using this medication? Visit your doctor or healthcare professional regularly. Tell your doctor or healthcare professional if your  symptoms do not start to get better or if theyget worse. You may need blood work done while you are taking this medicine. You may need to follow a special diet. Talk to your doctor. Foods that contain iron include: whole grains/cereals, dried fruits, beans, or peas, leafy greenvegetables, and organ meats (liver, kidney). What side effects may I notice from receiving this medication? Side effects that you should report to your doctor or health care professionalas soon as possible: allergic reactions like skin rash, itching or hives, swelling of the face, lips, or tongue breathing problems changes in blood pressure feeling faint or lightheaded, falls fever or chills flushing, sweating, or hot feelings swelling of the ankles or feet Side effects that usually do not require medical attention (report to yourdoctor or health care professional if they continue or are bothersome): diarrhea headache nausea, vomiting stomach pain This list may not describe all possible side effects. Call your doctor for medical advice about side effects. You may report side effects to FDA at1-800-FDA-1088. Where should I keep my medication? This drug is given in a hospital or clinic and will not be stored at home. NOTE: This sheet is a summary. It may not cover all possible information. If you have questions about this medicine, talk to your doctor, pharmacist, orhealth care provider.  2022 Elsevier/Gold Standard (2016-10-24 20:21:10)  

## 2021-04-16 ENCOUNTER — Inpatient Hospital Stay: Payer: Medicare Other

## 2021-04-16 ENCOUNTER — Other Ambulatory Visit: Payer: Self-pay

## 2021-04-16 VITALS — BP 125/61 | HR 86 | Temp 97.5°F | Resp 18

## 2021-04-16 DIAGNOSIS — C9310 Chronic myelomonocytic leukemia not having achieved remission: Secondary | ICD-10-CM

## 2021-04-16 DIAGNOSIS — Z79899 Other long term (current) drug therapy: Secondary | ICD-10-CM | POA: Diagnosis not present

## 2021-04-16 DIAGNOSIS — D649 Anemia, unspecified: Secondary | ICD-10-CM | POA: Diagnosis not present

## 2021-04-16 DIAGNOSIS — D860 Sarcoidosis of lung: Secondary | ICD-10-CM | POA: Diagnosis not present

## 2021-04-16 DIAGNOSIS — D4620 Refractory anemia with excess of blasts, unspecified: Secondary | ICD-10-CM

## 2021-04-16 DIAGNOSIS — M7989 Other specified soft tissue disorders: Secondary | ICD-10-CM | POA: Diagnosis not present

## 2021-04-16 MED ORDER — DARBEPOETIN ALFA 300 MCG/0.6ML IJ SOSY
PREFILLED_SYRINGE | INTRAMUSCULAR | Status: AC
  Filled 2021-04-16: qty 0.6

## 2021-04-16 MED ORDER — DARBEPOETIN ALFA 300 MCG/0.6ML IJ SOSY
300.0000 ug | PREFILLED_SYRINGE | Freq: Once | INTRAMUSCULAR | Status: AC
  Administered 2021-04-16: 300 ug via SUBCUTANEOUS

## 2021-04-16 NOTE — Patient Instructions (Signed)
Darbepoetin Alfa injection What is this medication? DARBEPOETIN ALFA (dar be POE e tin AL fa) helps your body make more red blood cells. It is used to treat anemia caused by chronic kidney failure and chemotherapy. This medicine may be used for other purposes; ask your health care provider or pharmacist if you have questions. COMMON BRAND NAME(S): Aranesp What should I tell my care team before I take this medication? They need to know if you have any of these conditions: blood clotting disorders or history of blood clots cancer patient not on chemotherapy cystic fibrosis heart disease, such as angina, heart failure, or a history of a heart attack hemoglobin level of 12 g/dL or greater high blood pressure low levels of folate, iron, or vitamin B12 seizures an unusual or allergic reaction to darbepoetin, erythropoietin, albumin, hamster proteins, latex, other medicines, foods, dyes, or preservatives pregnant or trying to get pregnant breast-feeding How should I use this medication? This medicine is for injection into a vein or under the skin. It is usually given by a health care professional in a hospital or clinic setting. If you get this medicine at home, you will be taught how to prepare and give this medicine. Use exactly as directed. Take your medicine at regular intervals. Do not take your medicine more often than directed. It is important that you put your used needles and syringes in a special sharps container. Do not put them in a trash can. If you do not have a sharps container, call your pharmacist or healthcare provider to get one. A special MedGuide will be given to you by the pharmacist with each prescription and refill. Be sure to read this information carefully each time. Talk to your pediatrician regarding the use of this medicine in children. While this medicine may be used in children as young as 1 month of age for selected conditions, precautions do apply. Overdosage: If you  think you have taken too much of this medicine contact a poison control center or emergency room at once. NOTE: This medicine is only for you. Do not share this medicine with others. What if I miss a dose? If you miss a dose, take it as soon as you can. If it is almost time for your next dose, take only that dose. Do not take double or extra doses. What may interact with this medication? Do not take this medicine with any of the following medications: epoetin alfa This list may not describe all possible interactions. Give your health care provider a list of all the medicines, herbs, non-prescription drugs, or dietary supplements you use. Also tell them if you smoke, drink alcohol, or use illegal drugs. Some items may interact with your medicine. What should I watch for while using this medication? Your condition will be monitored carefully while you are receiving this medicine. You may need blood work done while you are taking this medicine. This medicine may cause a decrease in vitamin B6. You should make sure that you get enough vitamin B6 while you are taking this medicine. Discuss the foods you eat and the vitamins you take with your health care professional. What side effects may I notice from receiving this medication? Side effects that you should report to your doctor or health care professional as soon as possible: allergic reactions like skin rash, itching or hives, swelling of the face, lips, or tongue breathing problems changes in vision chest pain confusion, trouble speaking or understanding feeling faint or lightheaded, falls high blood pressure   muscle aches or pains pain, swelling, warmth in the leg rapid weight gain severe headaches sudden numbness or weakness of the face, arm or leg trouble walking, dizziness, loss of balance or coordination seizures (convulsions) swelling of the ankles, feet, hands unusually weak or tired Side effects that usually do not require medical  attention (report to your doctor or health care professional if they continue or are bothersome): diarrhea fever, chills (flu-like symptoms) headaches nausea, vomiting redness, stinging, or swelling at site where injected This list may not describe all possible side effects. Call your doctor for medical advice about side effects. You may report side effects to FDA at 1-800-FDA-1088. Where should I keep my medication? Keep out of the reach of children. Store in a refrigerator between 2 and 8 degrees C (36 and 46 degrees F). Do not freeze. Do not shake. Throw away any unused portion if using a single-dose vial. Throw away any unused medicine after the expiration date. NOTE: This sheet is a summary. It may not cover all possible information. If you have questions about this medicine, talk to your doctor, pharmacist, or health care provider.  2022 Elsevier/Gold Standard (2017-09-20 16:44:20)  

## 2021-04-30 ENCOUNTER — Inpatient Hospital Stay (HOSPITAL_BASED_OUTPATIENT_CLINIC_OR_DEPARTMENT_OTHER): Payer: Medicare Other | Admitting: Hematology & Oncology

## 2021-04-30 ENCOUNTER — Inpatient Hospital Stay: Payer: Medicare Other | Attending: Hematology & Oncology

## 2021-04-30 ENCOUNTER — Other Ambulatory Visit: Payer: Self-pay

## 2021-04-30 ENCOUNTER — Inpatient Hospital Stay: Payer: Medicare Other

## 2021-04-30 ENCOUNTER — Encounter: Payer: Self-pay | Admitting: Hematology & Oncology

## 2021-04-30 VITALS — BP 132/67 | HR 85 | Temp 98.1°F | Resp 18 | Wt 183.0 lb

## 2021-04-30 DIAGNOSIS — C9310 Chronic myelomonocytic leukemia not having achieved remission: Secondary | ICD-10-CM | POA: Diagnosis not present

## 2021-04-30 DIAGNOSIS — D4620 Refractory anemia with excess of blasts, unspecified: Secondary | ICD-10-CM

## 2021-04-30 DIAGNOSIS — D860 Sarcoidosis of lung: Secondary | ICD-10-CM | POA: Diagnosis not present

## 2021-04-30 DIAGNOSIS — D630 Anemia in neoplastic disease: Secondary | ICD-10-CM | POA: Insufficient documentation

## 2021-04-30 DIAGNOSIS — M7989 Other specified soft tissue disorders: Secondary | ICD-10-CM | POA: Diagnosis not present

## 2021-04-30 DIAGNOSIS — Z79899 Other long term (current) drug therapy: Secondary | ICD-10-CM | POA: Insufficient documentation

## 2021-04-30 DIAGNOSIS — D649 Anemia, unspecified: Secondary | ICD-10-CM | POA: Insufficient documentation

## 2021-04-30 LAB — RETICULOCYTES
Immature Retic Fract: 10.1 % (ref 2.3–15.9)
RBC.: 2.75 MIL/uL — ABNORMAL LOW (ref 4.22–5.81)
Retic Count, Absolute: 50.9 10*3/uL (ref 19.0–186.0)
Retic Ct Pct: 1.9 % (ref 0.4–3.1)

## 2021-04-30 LAB — CMP (CANCER CENTER ONLY)
ALT: 13 U/L (ref 0–44)
AST: 18 U/L (ref 15–41)
Albumin: 4.1 g/dL (ref 3.5–5.0)
Alkaline Phosphatase: 54 U/L (ref 38–126)
Anion gap: 7 (ref 5–15)
BUN: 28 mg/dL — ABNORMAL HIGH (ref 8–23)
CO2: 30 mmol/L (ref 22–32)
Calcium: 10 mg/dL (ref 8.9–10.3)
Chloride: 103 mmol/L (ref 98–111)
Creatinine: 1.2 mg/dL (ref 0.61–1.24)
GFR, Estimated: 59 mL/min — ABNORMAL LOW (ref >60)
Glucose, Bld: 80 mg/dL (ref 70–99)
Potassium: 3.7 mmol/L (ref 3.5–5.1)
Sodium: 140 mmol/L (ref 135–145)
Total Bilirubin: 0.5 mg/dL (ref 0.3–1.2)
Total Protein: 6.2 g/dL — ABNORMAL LOW (ref 6.5–8.1)

## 2021-04-30 LAB — CBC WITH DIFFERENTIAL (CANCER CENTER ONLY)
Abs Immature Granulocytes: 0 10*3/uL (ref 0.00–0.07)
Basophils Absolute: 0 10*3/uL (ref 0.0–0.1)
Basophils Relative: 1 %
Eosinophils Absolute: 0 10*3/uL (ref 0.0–0.5)
Eosinophils Relative: 0 %
HCT: 31 % — ABNORMAL LOW (ref 39.0–52.0)
Hemoglobin: 10.2 g/dL — ABNORMAL LOW (ref 13.0–17.0)
Lymphocytes Relative: 50 %
Lymphs Abs: 2.3 10*3/uL (ref 0.7–4.0)
MCH: 36.6 pg — ABNORMAL HIGH (ref 26.0–34.0)
MCHC: 32.9 g/dL (ref 30.0–36.0)
MCV: 111.1 fL — ABNORMAL HIGH (ref 80.0–100.0)
Monocytes Absolute: 1.7 10*3/uL — ABNORMAL HIGH (ref 0.1–1.0)
Monocytes Relative: 37 %
Neutro Abs: 0.6 10*3/uL — ABNORMAL LOW (ref 1.7–7.7)
Neutrophils Relative %: 12 %
Platelet Count: 77 10*3/uL — ABNORMAL LOW (ref 150–400)
RBC: 2.79 MIL/uL — ABNORMAL LOW (ref 4.22–5.81)
RDW: 15.8 % — ABNORMAL HIGH (ref 11.5–15.5)
WBC Count: 4.6 10*3/uL (ref 4.0–10.5)
nRBC: 0 % (ref 0.0–0.2)

## 2021-04-30 LAB — SAVE SMEAR(SSMR), FOR PROVIDER SLIDE REVIEW

## 2021-04-30 MED ORDER — DARBEPOETIN ALFA 300 MCG/0.6ML IJ SOSY
300.0000 ug | PREFILLED_SYRINGE | Freq: Once | INTRAMUSCULAR | Status: AC
  Administered 2021-04-30: 300 ug via SUBCUTANEOUS
  Filled 2021-04-30: qty 0.6

## 2021-04-30 NOTE — Patient Instructions (Signed)
Darbepoetin Alfa injection What is this medication? DARBEPOETIN ALFA (dar be POE e tin AL fa) helps your body make more red blood cells. It is used to treat anemia caused by chronic kidney failure and chemotherapy. This medicine may be used for other purposes; ask your health care provider or pharmacist if you have questions. COMMON BRAND NAME(S): Aranesp What should I tell my care team before I take this medication? They need to know if you have any of these conditions: blood clotting disorders or history of blood clots cancer patient not on chemotherapy cystic fibrosis heart disease, such as angina, heart failure, or a history of a heart attack hemoglobin level of 12 g/dL or greater high blood pressure low levels of folate, iron, or vitamin B12 seizures an unusual or allergic reaction to darbepoetin, erythropoietin, albumin, hamster proteins, latex, other medicines, foods, dyes, or preservatives pregnant or trying to get pregnant breast-feeding How should I use this medication? This medicine is for injection into a vein or under the skin. It is usually given by a health care professional in a hospital or clinic setting. If you get this medicine at home, you will be taught how to prepare and give this medicine. Use exactly as directed. Take your medicine at regular intervals. Do not take your medicine more often than directed. It is important that you put your used needles and syringes in a special sharps container. Do not put them in a trash can. If you do not have a sharps container, call your pharmacist or healthcare provider to get one. A special MedGuide will be given to you by the pharmacist with each prescription and refill. Be sure to read this information carefully each time. Talk to your pediatrician regarding the use of this medicine in children. While this medicine may be used in children as young as 1 month of age for selected conditions, precautions do apply. Overdosage: If you  think you have taken too much of this medicine contact a poison control center or emergency room at once. NOTE: This medicine is only for you. Do not share this medicine with others. What if I miss a dose? If you miss a dose, take it as soon as you can. If it is almost time for your next dose, take only that dose. Do not take double or extra doses. What may interact with this medication? Do not take this medicine with any of the following medications: epoetin alfa This list may not describe all possible interactions. Give your health care provider a list of all the medicines, herbs, non-prescription drugs, or dietary supplements you use. Also tell them if you smoke, drink alcohol, or use illegal drugs. Some items may interact with your medicine. What should I watch for while using this medication? Your condition will be monitored carefully while you are receiving this medicine. You may need blood work done while you are taking this medicine. This medicine may cause a decrease in vitamin B6. You should make sure that you get enough vitamin B6 while you are taking this medicine. Discuss the foods you eat and the vitamins you take with your health care professional. What side effects may I notice from receiving this medication? Side effects that you should report to your doctor or health care professional as soon as possible: allergic reactions like skin rash, itching or hives, swelling of the face, lips, or tongue breathing problems changes in vision chest pain confusion, trouble speaking or understanding feeling faint or lightheaded, falls high blood pressure   muscle aches or pains pain, swelling, warmth in the leg rapid weight gain severe headaches sudden numbness or weakness of the face, arm or leg trouble walking, dizziness, loss of balance or coordination seizures (convulsions) swelling of the ankles, feet, hands unusually weak or tired Side effects that usually do not require medical  attention (report to your doctor or health care professional if they continue or are bothersome): diarrhea fever, chills (flu-like symptoms) headaches nausea, vomiting redness, stinging, or swelling at site where injected This list may not describe all possible side effects. Call your doctor for medical advice about side effects. You may report side effects to FDA at 1-800-FDA-1088. Where should I keep my medication? Keep out of the reach of children. Store in a refrigerator between 2 and 8 degrees C (36 and 46 degrees F). Do not freeze. Do not shake. Throw away any unused portion if using a single-dose vial. Throw away any unused medicine after the expiration date. NOTE: This sheet is a summary. It may not cover all possible information. If you have questions about this medicine, talk to your doctor, pharmacist, or health care provider.  2022 Elsevier/Gold Standard (2017-09-20 16:44:20)  

## 2021-04-30 NOTE — Progress Notes (Signed)
And Hematology and Oncology Follow Up Visit  Andrew Ramos [id number] 1931/11/11 87 y.o. 04/30/2021   Principle Diagnosis:  Chronic myelomonocytic leukemia --intermediate 2  risk  Current Therapy:   Aranesp 300 mcg sq q 3 week for Hgb <11 IV Iron -- Feraheme given on 04/09/2021     Interim History:  Andrew Ramos is back for follow-up.  He is responded very well to the Aranesp and IV iron.  He got IV iron back in July.  We started the Aranesp in July.  Again his hemoglobin is come up quite nicely.  When we saw him in July, his ferritin was 90 with an iron saturation of 14%.  He has had no bleeding.  He is staying home.  His wife is having a hard time with her lungs.  Is sound like she has pulmonary sarcoidosis.  There is been no issues with fever.  He has had no COVID exposures.  He is going to the bathroom okay.  There is no change in bowel or bladder habits.  He has had no problems with rashes.  He does have little bit of chronic leg swelling.  Overall, his appetite has been doing pretty well.  There is no nausea or vomiting.  His performed status right now is ECOG 2.     Medications:  Current Outpatient Medications:    atorvastatin (LIPITOR) 40 MG tablet, Take 40 mg by mouth daily., Disp: , Rfl:    Calcium Carbonate-Vitamin D (CALCIUM 600 + D PO), Take 1 tablet by mouth every morning. , Disp: , Rfl:    Cholecalciferol (VITAMIN D3) 1000 UNITS tablet, Take 1,000 Units by mouth every morning., Disp: , Rfl:    COVID-19 mRNA vaccine, Moderna, (MODERNA COVID-19 VACCINE) 100 MCG/0.5ML injection, Inject into the muscle. (Patient not taking: Reported on 02/23/2021), Disp: 0.25 mL, Rfl: 0   donepezil (ARICEPT) 10 MG tablet, Take 1 tablet (10 mg total) by mouth at bedtime., Disp: 90 tablet, Rfl: 3   fluticasone (FLONASE) 50 MCG/ACT nasal spray, Place 2 sprays into both nostrils daily., Disp: 16 g, Rfl: 6   hydroxychloroquine (PLAQUENIL) 200 MG tablet, Take 200 mg by mouth 2 (two) times  daily., Disp: , Rfl:    Iron, Ferrous Sulfate, 325 (65 Fe) MG TABS, Take 325 mg by mouth daily., Disp: 30 tablet, Rfl:    lisinopril-hydrochlorothiazide (ZESTORETIC) 10-12.5 MG tablet, TAKE 1 TABLET DAILY, Disp: 90 tablet, Rfl: 3   loratadine (CLARITIN) 10 MG tablet, TAKE 1 TABLET DAILY, Disp: 90 tablet, Rfl: 3   Multiple Vitamins-Minerals (CENTRUM SILVER ULTRA MENS) TABS, Take 1 tablet by mouth every evening., Disp: , Rfl:    polyethylene glycol powder (GLYCOLAX/MIRALAX) powder, Take 17 g by mouth daily as needed for moderate constipation. 1 scoop daily, Disp: , Rfl:    rosuvastatin (CRESTOR) 40 MG tablet, Take 0.5 tablets by mouth daily., Disp: , Rfl:    terazosin (HYTRIN) 1 MG capsule, TAKE 1 CAPSULE AT BEDTIME, Disp: 90 capsule, Rfl: 3   venlafaxine XR (EFFEXOR-XR) 75 MG 24 hr capsule, Take 1 capsule (75 mg total) by mouth daily with breakfast., Disp: 90 capsule, Rfl: 3  Allergies: No Known Allergies  Past Medical History, Surgical history, Social history, and Family History were reviewed and updated.  Review of Systems: Review of Systems  Constitutional: Negative.   HENT:  Negative.    Eyes: Negative.   Respiratory: Negative.    Cardiovascular: Negative.   Gastrointestinal: Negative.   Endocrine: Negative.   Genitourinary: Negative.  Musculoskeletal: Negative.   Skin: Negative.   Neurological: Negative.   Hematological: Negative.   Psychiatric/Behavioral: Negative.     Physical Exam:  weight is 183 lb (83 kg). His oral temperature is 98.1 F (36.7 C). His blood pressure is 132/67 and his pulse is 85. His respiration is 18 and oxygen saturation is 100%.   Wt Readings from Last 3 Encounters:  04/30/21 183 lb (83 kg)  04/07/21 184 lb (83.5 kg)  03/05/21 183 lb 9.6 oz (83.3 kg)    Physical Exam Vitals reviewed.  HENT:     Head: Normocephalic and atraumatic.  Eyes:     Pupils: Pupils are equal, round, and reactive to light.  Cardiovascular:     Rate and Rhythm: Normal  rate and regular rhythm.     Heart sounds: Normal heart sounds.  Pulmonary:     Effort: Pulmonary effort is normal.     Breath sounds: Normal breath sounds.  Abdominal:     General: Bowel sounds are normal.     Palpations: Abdomen is soft.  Musculoskeletal:        General: No tenderness or deformity. Normal range of motion.     Cervical back: Normal range of motion.  Lymphadenopathy:     Cervical: No cervical adenopathy.  Skin:    General: Skin is warm and dry.     Findings: No erythema or rash.  Neurological:     Mental Status: He is alert and oriented to person, place, and time.  Psychiatric:        Behavior: Behavior normal.        Thought Content: Thought content normal.        Judgment: Judgment normal.     Lab Results  Component Value Date   WBC 4.6 04/30/2021   HGB 10.2 (L) 04/30/2021   HCT 31.0 (L) 04/30/2021   MCV 111.1 (H) 04/30/2021   PLT 77 (L) 04/30/2021     Chemistry      Component Value Date/Time   NA 140 04/30/2021 1343   NA 144 02/26/2021 0000   K 3.7 04/30/2021 1343   CL 103 04/30/2021 1343   CO2 30 04/30/2021 1343   BUN 28 (H) 04/30/2021 1343   BUN 21 02/26/2021 0000   CREATININE 1.20 04/30/2021 1343   CREATININE 1.14 (H) 06/04/2020 1041   GLU 94 02/26/2021 0000      Component Value Date/Time   CALCIUM 10.0 04/30/2021 1343   ALKPHOS 54 04/30/2021 1343   AST 18 04/30/2021 1343   ALT 13 04/30/2021 1343   BILITOT 0.5 04/30/2021 1343      Impression and Plan: Andrew Ramos is a very nice 87 year old white male.  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  He served in [place].  As such, is a true American hero.  We will give him a dose of Aranesp today.  I would think his iron level should be okay.  I am glad that we can get his hemoglobin up as he has more energy.  We will go ahead and plan to get her back to see [place] in another 3 or 4 weeks.  I would like to see him back before the Labor Day weekend to make sure  that his blood count is doing better.     Volanda Napoleon, MD 8/12/20222:14 PM

## 2021-05-03 LAB — IRON AND TIBC
Iron: 73 ug/dL (ref 42–163)
Saturation Ratios: 24 % (ref 20–55)
TIBC: 299 ug/dL (ref 202–409)
UIBC: 226 ug/dL (ref 117–376)

## 2021-05-03 LAB — FERRITIN: Ferritin: 258 ng/mL (ref 24–336)

## 2021-05-20 ENCOUNTER — Encounter: Payer: Self-pay | Admitting: Hematology & Oncology

## 2021-05-20 ENCOUNTER — Other Ambulatory Visit: Payer: Self-pay

## 2021-05-20 ENCOUNTER — Inpatient Hospital Stay (HOSPITAL_BASED_OUTPATIENT_CLINIC_OR_DEPARTMENT_OTHER): Payer: Medicare Other | Admitting: Hematology & Oncology

## 2021-05-20 ENCOUNTER — Inpatient Hospital Stay: Payer: Medicare Other

## 2021-05-20 ENCOUNTER — Telehealth: Payer: Self-pay

## 2021-05-20 ENCOUNTER — Inpatient Hospital Stay: Payer: Medicare Other | Attending: Hematology & Oncology

## 2021-05-20 ENCOUNTER — Telehealth: Payer: Self-pay | Admitting: *Deleted

## 2021-05-20 VITALS — BP 145/81 | HR 92 | Temp 98.1°F | Resp 20 | Wt 178.4 lb

## 2021-05-20 DIAGNOSIS — Z79899 Other long term (current) drug therapy: Secondary | ICD-10-CM | POA: Insufficient documentation

## 2021-05-20 DIAGNOSIS — C9310 Chronic myelomonocytic leukemia not having achieved remission: Secondary | ICD-10-CM

## 2021-05-20 DIAGNOSIS — D649 Anemia, unspecified: Secondary | ICD-10-CM | POA: Insufficient documentation

## 2021-05-20 DIAGNOSIS — D4620 Refractory anemia with excess of blasts, unspecified: Secondary | ICD-10-CM

## 2021-05-20 LAB — CBC WITH DIFFERENTIAL (CANCER CENTER ONLY)
Abs Immature Granulocytes: 0.04 10*3/uL (ref 0.00–0.07)
Basophils Absolute: 0 10*3/uL (ref 0.0–0.1)
Basophils Relative: 0 %
Eosinophils Absolute: 0 10*3/uL (ref 0.0–0.5)
Eosinophils Relative: 0 %
HCT: 34.5 % — ABNORMAL LOW (ref 39.0–52.0)
Hemoglobin: 11.5 g/dL — ABNORMAL LOW (ref 13.0–17.0)
Immature Granulocytes: 1 %
Lymphocytes Relative: 42 %
Lymphs Abs: 1.6 10*3/uL (ref 0.7–4.0)
MCH: 36.6 pg — ABNORMAL HIGH (ref 26.0–34.0)
MCHC: 33.3 g/dL (ref 30.0–36.0)
MCV: 109.9 fL — ABNORMAL HIGH (ref 80.0–100.0)
Monocytes Absolute: 1.9 10*3/uL — ABNORMAL HIGH (ref 0.1–1.0)
Monocytes Relative: 49 %
Neutro Abs: 0.3 10*3/uL — CL (ref 1.7–7.7)
Neutrophils Relative %: 8 %
Platelet Count: 69 10*3/uL — ABNORMAL LOW (ref 150–400)
RBC: 3.14 MIL/uL — ABNORMAL LOW (ref 4.22–5.81)
RDW: 14.5 % (ref 11.5–15.5)
WBC Count: 3.9 10*3/uL — ABNORMAL LOW (ref 4.0–10.5)
nRBC: 0 % (ref 0.0–0.2)

## 2021-05-20 LAB — CMP (CANCER CENTER ONLY)
ALT: 14 U/L (ref 0–44)
AST: 20 U/L (ref 15–41)
Albumin: 4.4 g/dL (ref 3.5–5.0)
Alkaline Phosphatase: 61 U/L (ref 38–126)
Anion gap: 8 (ref 5–15)
BUN: 21 mg/dL (ref 8–23)
CO2: 30 mmol/L (ref 22–32)
Calcium: 10.3 mg/dL (ref 8.9–10.3)
Chloride: 103 mmol/L (ref 98–111)
Creatinine: 1.17 mg/dL (ref 0.61–1.24)
GFR, Estimated: >60 mL/min (ref >60)
Glucose, Bld: 95 mg/dL (ref 70–99)
Potassium: 4.6 mmol/L (ref 3.5–5.1)
Sodium: 141 mmol/L (ref 135–145)
Total Bilirubin: 0.6 mg/dL (ref 0.3–1.2)
Total Protein: 6.8 g/dL (ref 6.5–8.1)

## 2021-05-20 LAB — RETICULOCYTES
Immature Retic Fract: 4.8 % (ref 2.3–15.9)
RBC.: 3.13 MIL/uL — ABNORMAL LOW (ref 4.22–5.81)
Retic Count, Absolute: 34.4 10*3/uL (ref 19.0–186.0)
Retic Ct Pct: 1.1 % (ref 0.4–3.1)

## 2021-05-20 NOTE — Telephone Encounter (Signed)
Appts made per 05/20/21 los, pt to gain sch at [employer] and through [employer]

## 2021-05-20 NOTE — Telephone Encounter (Signed)
Critical ANC of .3 reported by [facility] in lab.  Dr Marin Olp notified.  No orders received.

## 2021-05-20 NOTE — Progress Notes (Signed)
And Hematology and Oncology Follow Up Visit  Andrew Ramos [id number] [birth year] 87 y.o. 05/20/2021   Principle Diagnosis:  Chronic myelomonocytic leukemia --intermediate 2  risk  Current Therapy:   Aranesp 300 mcg sq q 3 week for Hgb <11 IV Iron -- Feraheme given on 04/09/2021     Interim History:  Andrew Ramos is back for follow-up.  I am very impressed with the fact that his hemoglobin is 11.5.  He has done incredibly well with the Aranesp.  We did give a dose of iron back in July.  I am sure this helped a little bit.  He is feeling well.  He has a take care of his wife.  She has sarcoidosis.  Over last saw him in early August, his ferritin was 258 with an iron saturation of 24%.  He has had no problems with bleeding.  There is been no bruising.  He has had no fever.  There is been no change in bowel or bladder habits.  He has had no problems with headache.  Overall, I would say his performance status is ECOG 1.     Medications:  Current Outpatient Medications:    atorvastatin (LIPITOR) 40 MG tablet, Take 40 mg by mouth daily., Disp: , Rfl:    Calcium Carbonate-Vitamin D (CALCIUM 600 + D PO), Take 1 tablet by mouth every morning. , Disp: , Rfl:    Cholecalciferol (VITAMIN D3) 1000 UNITS tablet, Take 1,000 Units by mouth every morning., Disp: , Rfl:    docusate sodium (COLACE) 100 MG capsule, Take 200 mg by mouth daily as needed for mild constipation., Disp: , Rfl:    donepezil (ARICEPT) 10 MG tablet, Take 1 tablet (10 mg total) by mouth at bedtime., Disp: 90 tablet, Rfl: 3   fluticasone (FLONASE) 50 MCG/ACT nasal spray, Place 2 sprays into both nostrils daily., Disp: 16 g, Rfl: 6   hydroxychloroquine (PLAQUENIL) 200 MG tablet, Take 200 mg by mouth 2 (two) times daily., Disp: , Rfl:    Iron, Ferrous Sulfate, 325 (65 Fe) MG TABS, Take 325 mg by mouth daily., Disp: 30 tablet, Rfl:    lisinopril-hydrochlorothiazide (ZESTORETIC) 10-12.5 MG tablet, TAKE 1 TABLET DAILY, Disp: 90  tablet, Rfl: 3   loratadine (CLARITIN) 10 MG tablet, TAKE 1 TABLET DAILY, Disp: 90 tablet, Rfl: 3   Multiple Vitamins-Minerals (CENTRUM SILVER ULTRA MENS) TABS, Take 1 tablet by mouth every evening., Disp: , Rfl:    rosuvastatin (CRESTOR) 40 MG tablet, Take 0.5 tablets by mouth daily., Disp: , Rfl:    terazosin (HYTRIN) 1 MG capsule, TAKE 1 CAPSULE AT BEDTIME, Disp: 90 capsule, Rfl: 3   venlafaxine XR (EFFEXOR-XR) 75 MG 24 hr capsule, Take 1 capsule (75 mg total) by mouth daily with breakfast., Disp: 90 capsule, Rfl: 3  Allergies: No Known Allergies  Past Medical History, Surgical history, Social history, and Family History were reviewed and updated.  Review of Systems: Review of Systems  Constitutional: Negative.   HENT:  Negative.    Eyes: Negative.   Respiratory: Negative.    Cardiovascular: Negative.   Gastrointestinal: Negative.   Endocrine: Negative.   Genitourinary: Negative.    Musculoskeletal: Negative.   Skin: Negative.   Neurological: Negative.   Hematological: Negative.   Psychiatric/Behavioral: Negative.     Physical Exam:  weight is 178 lb 6.4 oz (80.9 kg). His oral temperature is 98.1 F (36.7 C). His blood pressure is 145/81 (abnormal) and his pulse is 92. His respiration is 20 and oxygen  saturation is 95%.   Wt Readings from Last 3 Encounters:  05/20/21 178 lb 6.4 oz (80.9 kg)  04/30/21 183 lb (83 kg)  04/07/21 184 lb (83.5 kg)    Physical Exam Vitals reviewed.  HENT:     Head: Normocephalic and atraumatic.  Eyes:     Pupils: Pupils are equal, round, and reactive to light.  Cardiovascular:     Rate and Rhythm: Normal rate and regular rhythm.     Heart sounds: Normal heart sounds.  Pulmonary:     Effort: Pulmonary effort is normal.     Breath sounds: Normal breath sounds.  Abdominal:     General: Bowel sounds are normal.     Palpations: Abdomen is soft.  Musculoskeletal:        General: No tenderness or deformity. Normal range of motion.      Cervical back: Normal range of motion.  Lymphadenopathy:     Cervical: No cervical adenopathy.  Skin:    General: Skin is warm and dry.     Findings: No erythema or rash.  Neurological:     Mental Status: He is alert and oriented to person, place, and time.  Psychiatric:        Behavior: Behavior normal.        Thought Content: Thought content normal.        Judgment: Judgment normal.     Lab Results  Component Value Date   WBC 3.9 (L) 05/20/2021   HGB 11.5 (L) 05/20/2021   HCT 34.5 (L) 05/20/2021   MCV 109.9 (H) 05/20/2021   PLT 69 (L) 05/20/2021     Chemistry      Component Value Date/Time   NA 141 05/20/2021 1346   NA 144 02/26/2021 0000   K 4.6 05/20/2021 1346   CL 103 05/20/2021 1346   CO2 30 05/20/2021 1346   BUN 21 05/20/2021 1346   BUN 21 02/26/2021 0000   CREATININE 1.17 05/20/2021 1346   CREATININE 1.14 (H) 06/04/2020 1041   GLU 94 02/26/2021 0000      Component Value Date/Time   CALCIUM 10.3 05/20/2021 1346   ALKPHOS 61 05/20/2021 1346   AST 20 05/20/2021 1346   ALT 14 05/20/2021 1346   BILITOT 0.6 05/20/2021 1346      Impression and Plan: Andrew Ramos is a very nice 87 year old white male.  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  He served in [place].  As such, is a true American hero.  He does not need any Aranesp today.  I am very happy about this.  We will have to see what his iron levels are.  We will plan to get him back in another 4 or 5 weeks.  Para 5 noted that his platelet count is slowly trending downward.  This certainly could be from his underlying bone marrow disease.  We will have to watch this.    Volanda Napoleon, MD 9/1/20222:33 PM

## 2021-05-21 LAB — IRON AND TIBC
Iron: 86 ug/dL (ref 45–182)
Saturation Ratios: 25 % (ref 17.9–39.5)
TIBC: 340 ug/dL (ref 250–450)
UIBC: 254 ug/dL

## 2021-05-21 LAB — FERRITIN: Ferritin: 112 ng/mL (ref 24–336)

## 2021-05-25 ENCOUNTER — Encounter: Payer: Self-pay | Admitting: Hematology & Oncology

## 2021-05-25 ENCOUNTER — Other Ambulatory Visit (HOSPITAL_BASED_OUTPATIENT_CLINIC_OR_DEPARTMENT_OTHER): Payer: Self-pay

## 2021-06-07 ENCOUNTER — Other Ambulatory Visit: Payer: Self-pay

## 2021-06-07 ENCOUNTER — Ambulatory Visit (INDEPENDENT_AMBULATORY_CARE_PROVIDER_SITE_OTHER): Payer: Medicare Other | Admitting: Family Medicine

## 2021-06-07 ENCOUNTER — Encounter: Payer: Self-pay | Admitting: Family Medicine

## 2021-06-07 VITALS — BP 112/80 | HR 93 | Temp 97.8°F | Resp 18 | Ht 72.0 in | Wt 175.8 lb

## 2021-06-07 DIAGNOSIS — I471 Supraventricular tachycardia, unspecified: Secondary | ICD-10-CM

## 2021-06-07 DIAGNOSIS — E785 Hyperlipidemia, unspecified: Secondary | ICD-10-CM

## 2021-06-07 DIAGNOSIS — Z23 Encounter for immunization: Secondary | ICD-10-CM | POA: Diagnosis not present

## 2021-06-07 DIAGNOSIS — C9310 Chronic myelomonocytic leukemia not having achieved remission: Secondary | ICD-10-CM | POA: Diagnosis not present

## 2021-06-07 DIAGNOSIS — E1169 Type 2 diabetes mellitus with other specified complication: Secondary | ICD-10-CM

## 2021-06-07 DIAGNOSIS — M353 Polymyalgia rheumatica: Secondary | ICD-10-CM

## 2021-06-07 DIAGNOSIS — E1151 Type 2 diabetes mellitus with diabetic peripheral angiopathy without gangrene: Secondary | ICD-10-CM | POA: Diagnosis not present

## 2021-06-07 DIAGNOSIS — D4620 Refractory anemia with excess of blasts, unspecified: Secondary | ICD-10-CM | POA: Diagnosis not present

## 2021-06-07 DIAGNOSIS — E1165 Type 2 diabetes mellitus with hyperglycemia: Secondary | ICD-10-CM

## 2021-06-07 DIAGNOSIS — D61810 Antineoplastic chemotherapy induced pancytopenia: Secondary | ICD-10-CM | POA: Diagnosis not present

## 2021-06-07 DIAGNOSIS — E119 Type 2 diabetes mellitus without complications: Secondary | ICD-10-CM | POA: Diagnosis not present

## 2021-06-07 DIAGNOSIS — I10 Essential (primary) hypertension: Secondary | ICD-10-CM

## 2021-06-07 NOTE — Assessment & Plan Note (Signed)
Diet controlled  Lab Results  Component Value Date   HGBA1C 5.6 02/18/2021

## 2021-06-07 NOTE — Assessment & Plan Note (Signed)
Encourage heart healthy diet such as MIND or DASH diet, increase exercise, avoid trans fats, simple carbohydrates and processed foods, consider a krill or fish or flaxseed oil cap daily.  °

## 2021-06-07 NOTE — Assessment & Plan Note (Signed)
Per oncology °

## 2021-06-07 NOTE — Progress Notes (Signed)
Established Patient Office Visit  Subjective:  Patient ID: Andrew Ramos, male    DOB: 1932-02-27  Age: 87 y.o. MRN: [id number]  CC:  Chief Complaint  Patient presents with   Hypertension   Hyperlipidemia   Diabetes   Follow-up    HPI Andrew Ramos presents for f/u dm/ bp an chol.  He would also like his flu shot.   Not complaints   Past Medical History:  Diagnosis Date   Chronic myelomonocytic leukemia ([place]) 11/25/2020   Colon polyps    Tubular Adenoma 2005   Diverticulosis    Goals of care, counseling/discussion 11/25/2020   Hepatitis    pt told by [facility] has hepatitis antibodies in blood    Hyperlipidemia    Hypertension    Low grade myelodysplastic syndrome lesions ([place]) 10/26/2020   Melanoma ([place])    Nonmelanoma skin cancer    Osteoarthritis    right hip- none since hip replacement   Polymyalgia rheumatica (HCC)    Polyposis of colon    Prostate cancer ([place]) 2007   s/p radiation seed implants   Prostate cancer [facility])    Thyroid disease sees dr altzhimer for yearly   nodules    Past Surgical History:  Procedure Laterality Date   CATARACT EXTRACTION     POLYPECTOMY     TONSILLECTOMY  as child   TOTAL HIP ARTHROPLASTY  08/27/2008   left   TOTAL HIP ARTHROPLASTY  06/12/2012   Procedure: TOTAL HIP ARTHROPLASTY ANTERIOR APPROACH;  Surgeon: Mauri Pole, MD;  Location: [facility];  Service: Orthopedics;  Laterality: Right;   TOTAL KNEE ARTHROPLASTY  12/31/2008   left    Family History  Problem Relation Age of Onset   Coronary artery disease Mother 53   Heart disease Mother 72       MI   Coronary artery disease Father 31   Pancreatic cancer Father 66       deceased 63   Pancreatic cancer Brother 33       deceased 74; smoker   Colon cancer Neg Hx    Colon polyps Neg Hx    Esophageal cancer Neg Hx    Kidney disease Neg Hx    Diabetes Neg Hx    Gallbladder disease Neg Hx     Social History   Socioeconomic History   Marital status: Married     Spouse name: Not on file   Number of children: 2   Years of education: Not on file   Highest education level: Not on file  Occupational History   Occupation: retired Lobbyist: RETIRED  Tobacco Use   Smoking status: Never   Smokeless tobacco: Never  Vaping Use   Vaping Use: Never used  Substance and Sexual Activity   Alcohol use: Yes    Alcohol/week: 0.0 standard drinks    Comment: occasional   Drug use: No   Sexual activity: Not Currently    Partners: Female  Other Topics Concern   Not on file  Social History Narrative   Exercise--no   Right handed    Nature conservation officer   Social Determinants of Health   Financial Resource Strain: Not on file  Food Insecurity: Not on file  Transportation Needs: Not on file  Physical Activity: Not on file  Stress: Not on file  Social Connections: Not on file  Intimate Partner Violence: Not on file    Outpatient Medications Prior to Visit  Medication Sig Dispense Refill  atorvastatin (LIPITOR) 40 MG tablet Take 40 mg by mouth daily.     Calcium Carbonate-Vitamin D (CALCIUM 600 + D PO) Take 1 tablet by mouth every morning.      Cholecalciferol (VITAMIN D3) 1000 UNITS tablet Take 1,000 Units by mouth every morning.     docusate sodium (COLACE) 100 MG capsule Take 200 mg by mouth daily as needed for mild constipation.     donepezil (ARICEPT) 10 MG tablet Take 1 tablet (10 mg total) by mouth at bedtime. 90 tablet 3   fluticasone (FLONASE) 50 MCG/ACT nasal spray Place 2 sprays into both nostrils daily. 16 g 6   hydroxychloroquine (PLAQUENIL) 200 MG tablet Take 200 mg by mouth 2 (two) times daily.     Iron, Ferrous Sulfate, 325 (65 Fe) MG TABS Take 325 mg by mouth daily. 30 tablet    lisinopril-hydrochlorothiazide (ZESTORETIC) 10-12.5 MG tablet TAKE 1 TABLET DAILY 90 tablet 3   loratadine (CLARITIN) 10 MG tablet TAKE 1 TABLET DAILY 90 tablet 3   Multiple Vitamins-Minerals (CENTRUM SILVER ULTRA MENS) TABS Take 1 tablet by mouth every  evening.     rosuvastatin (CRESTOR) 40 MG tablet Take 0.5 tablets by mouth daily.     terazosin (HYTRIN) 1 MG capsule TAKE 1 CAPSULE AT BEDTIME 90 capsule 3   venlafaxine XR (EFFEXOR-XR) 75 MG 24 hr capsule Take 1 capsule (75 mg total) by mouth daily with breakfast. 90 capsule 3   No facility-administered medications prior to visit.    No Known Allergies  ROS Review of Systems  Constitutional:  Negative for appetite change, diaphoresis, fatigue and unexpected weight change.  Eyes:  Negative for pain, redness and visual disturbance.  Respiratory:  Negative for cough, chest tightness, shortness of breath and wheezing.   Cardiovascular:  Negative for chest pain, palpitations and leg swelling.  Endocrine: Negative for cold intolerance, heat intolerance, polydipsia, polyphagia and polyuria.  Genitourinary:  Negative for difficulty urinating, dysuria and frequency.  Neurological:  Negative for dizziness, light-headedness, numbness and headaches.     Objective:    Physical Exam Vitals and nursing note reviewed.  Constitutional:      Appearance: He is well-developed.  HENT:     Head: Normocephalic and atraumatic.  Eyes:     Pupils: Pupils are equal, round, and reactive to light.  Neck:     Thyroid: No thyromegaly.  Cardiovascular:     Rate and Rhythm: Normal rate and regular rhythm.     Heart sounds: No murmur heard. Pulmonary:     Effort: Pulmonary effort is normal. No respiratory distress.     Breath sounds: Normal breath sounds. No wheezing or rales.  Chest:     Chest wall: No tenderness.  Musculoskeletal:        General: No tenderness.     Cervical back: Normal range of motion and neck supple.  Skin:    General: Skin is warm and dry.  Neurological:     Mental Status: He is alert and oriented to person, place, and time.  Psychiatric:        Behavior: Behavior normal.        Thought Content: Thought content normal.        Judgment: Judgment normal.    BP 112/80 (BP  Location: Right Arm, Patient Position: Sitting, Cuff Size: Normal)   Pulse 93   Temp 97.8 F (36.6 C) (Oral)   Resp 18   Ht 6' (1.829 m)   Wt 175 lb 12.8 oz (79.7 kg)  SpO2 96%   BMI 23.84 kg/m  Wt Readings from Last 3 Encounters:  06/07/21 175 lb 12.8 oz (79.7 kg)  05/20/21 178 lb 6.4 oz (80.9 kg)  04/30/21 183 lb (83 kg)     Health Maintenance Due  Topic Date Due   OPHTHALMOLOGY EXAM  04/04/2017   COVID-19 Vaccine (2 - Moderna risk series) 02/05/2021   FOOT EXAM  06/04/2021    There are no preventive care reminders to display for this patient.  Lab Results  Component Value Date   TSH 2.12 06/04/2020   Lab Results  Component Value Date   WBC 3.9 (L) 05/20/2021   HGB 11.5 (L) 05/20/2021   HCT 34.5 (L) 05/20/2021   MCV 109.9 (H) 05/20/2021   PLT 69 (L) 05/20/2021   Lab Results  Component Value Date   NA 141 05/20/2021   K 4.6 05/20/2021   CO2 30 05/20/2021   GLUCOSE 95 05/20/2021   BUN 21 05/20/2021   CREATININE 1.17 05/20/2021   BILITOT 0.6 05/20/2021   ALKPHOS 61 05/20/2021   AST 20 05/20/2021   ALT 14 05/20/2021   PROT 6.8 05/20/2021   ALBUMIN 4.4 05/20/2021   CALCIUM 10.3 05/20/2021   ANIONGAP 8 05/20/2021   GFR 75.15 03/16/2020   Lab Results  Component Value Date   CHOL 101 02/18/2021   Lab Results  Component Value Date   HDL 66 02/18/2021   Lab Results  Component Value Date   LDLCALC 26 02/18/2021   Lab Results  Component Value Date   TRIG 46 02/18/2021   Lab Results  Component Value Date   CHOLHDL 2.0 06/04/2020   Lab Results  Component Value Date   HGBA1C 5.6 02/18/2021      Assessment & Plan:   Problem List Items Addressed This Visit       Unprioritized   Chronic myelomonocytic leukemia ([place]) (Chronic)    Per oncology      Low grade myelodysplastic syndrome lesions (HCC) (Chronic)    Per oncology      PMR (polymyalgia rheumatica) (HCC)   Antineoplastic chemotherapy induced pancytopenia (CODE) ([place])    Diet-controlled diabetes mellitus ([place])    con't diet Lab Results  Component Value Date   HGBA1C 5.6 02/18/2021        DM (diabetes mellitus) type II, controlled, with peripheral vascular disorder ([place])   Essential hypertension    Well controlled, no changes to meds. Encouraged heart healthy diet such as the DASH diet and exercise as tolerated.       Hyperlipidemia    Encourage heart healthy diet such as MIND or DASH diet, increase exercise, avoid trans fats, simple carbohydrates and processed foods, consider a krill or fish or flaxseed oil cap daily.       Paroxysmal supraventricular tachycardia ([place])   Uncontrolled type 2 diabetes mellitus with hyperglycemia (HCC)    Diet controlled  Lab Results  Component Value Date   HGBA1C 5.6 02/18/2021        Other Visit Diagnoses     Need for influenza vaccination    -  Primary   Relevant Orders   Flu Vaccine QUAD High Dose(Fluad) (Completed)   Primary hypertension       Hyperlipidemia associated with type 2 diabetes mellitus ([place])           No orders of the defined types were placed in this encounter.   Follow-up: Return in about 3 months (around 09/06/2021) for hypertension, hyperlipidemia.    Alferd Apa  Carollee Herter, DO

## 2021-06-07 NOTE — Assessment & Plan Note (Signed)
con't diet Lab Results  Component Value Date   HGBA1C 5.6 02/18/2021

## 2021-06-07 NOTE — Assessment & Plan Note (Signed)
Well controlled, no changes to meds. Encouraged heart healthy diet such as the DASH diet and exercise as tolerated.  °

## 2021-06-07 NOTE — Patient Instructions (Signed)

## 2021-06-21 ENCOUNTER — Ambulatory Visit: Payer: Medicare Other | Attending: Internal Medicine

## 2021-06-21 DIAGNOSIS — Z23 Encounter for immunization: Secondary | ICD-10-CM

## 2021-06-21 NOTE — Progress Notes (Signed)
   Covid-19 Vaccination Clinic  Name:  Andrew Ramos    MRN: [id number] DOB: January 16, 1932  06/21/2021  Andrew Ramos was observed post Covid-19 immunization for 15 minutes without incident. He was provided with Vaccine Information Sheet and instruction to access the V-Safe system.   Andrew Ramos was instructed to call 911 with any severe reactions post vaccine: Difficulty breathing  Swelling of face and throat  A fast heartbeat  A bad rash all over body  Dizziness and weakness

## 2021-06-22 ENCOUNTER — Other Ambulatory Visit: Payer: Self-pay | Admitting: Family Medicine

## 2021-06-22 NOTE — Telephone Encounter (Signed)
Please advise- Pt has atorvastatin 40mg  and Crestor 40mg  on med list.

## 2021-06-25 DIAGNOSIS — Z86018 Personal history of other benign neoplasm: Secondary | ICD-10-CM | POA: Diagnosis not present

## 2021-06-25 DIAGNOSIS — Z85820 Personal history of malignant melanoma of skin: Secondary | ICD-10-CM | POA: Diagnosis not present

## 2021-06-25 DIAGNOSIS — Z23 Encounter for immunization: Secondary | ICD-10-CM | POA: Diagnosis not present

## 2021-06-25 DIAGNOSIS — D2271 Melanocytic nevi of right lower limb, including hip: Secondary | ICD-10-CM | POA: Diagnosis not present

## 2021-06-25 DIAGNOSIS — L570 Actinic keratosis: Secondary | ICD-10-CM | POA: Diagnosis not present

## 2021-06-25 DIAGNOSIS — L821 Other seborrheic keratosis: Secondary | ICD-10-CM | POA: Diagnosis not present

## 2021-06-25 DIAGNOSIS — L578 Other skin changes due to chronic exposure to nonionizing radiation: Secondary | ICD-10-CM | POA: Diagnosis not present

## 2021-06-25 DIAGNOSIS — Z85828 Personal history of other malignant neoplasm of skin: Secondary | ICD-10-CM | POA: Diagnosis not present

## 2021-06-25 DIAGNOSIS — L309 Dermatitis, unspecified: Secondary | ICD-10-CM | POA: Diagnosis not present

## 2021-06-25 DIAGNOSIS — D225 Melanocytic nevi of trunk: Secondary | ICD-10-CM | POA: Diagnosis not present

## 2021-06-25 DIAGNOSIS — D485 Neoplasm of uncertain behavior of skin: Secondary | ICD-10-CM | POA: Diagnosis not present

## 2021-06-29 ENCOUNTER — Encounter: Payer: Self-pay | Admitting: Physician Assistant

## 2021-06-29 ENCOUNTER — Other Ambulatory Visit (HOSPITAL_BASED_OUTPATIENT_CLINIC_OR_DEPARTMENT_OTHER): Payer: Self-pay

## 2021-06-29 ENCOUNTER — Ambulatory Visit (INDEPENDENT_AMBULATORY_CARE_PROVIDER_SITE_OTHER): Payer: Medicare Other | Admitting: Physician Assistant

## 2021-06-29 ENCOUNTER — Other Ambulatory Visit: Payer: Self-pay

## 2021-06-29 VITALS — BP 141/79 | HR 93 | Resp 18 | Ht 72.0 in | Wt 173.0 lb

## 2021-06-29 DIAGNOSIS — F0150 Vascular dementia without behavioral disturbance: Secondary | ICD-10-CM | POA: Diagnosis not present

## 2021-06-29 DIAGNOSIS — Z23 Encounter for immunization: Secondary | ICD-10-CM | POA: Diagnosis not present

## 2021-06-29 MED ORDER — MODERNA COVID-19 BIVAL BOOSTER 50 MCG/0.5ML IM SUSP
INTRAMUSCULAR | 0 refills | Status: DC
  Filled 2021-06-29: qty 0.5, 1d supply, fill #0

## 2021-06-29 MED ORDER — DONEPEZIL HCL 10 MG PO TABS: 10.0000 mg | ORAL_TABLET | Freq: Every day | ORAL | 3 refills | Status: DC

## 2021-06-29 NOTE — Patient Instructions (Signed)
It was a pleasure to see you today at our office.   Recommendations:  Meds: Follow up in 6  Months A refill for Aricept 10 mg a day was given     RECOMMENDATIONS FOR ALL PATIENTS WITH MEMORY PROBLEMS: 1. Continue to exercise (Recommend 30 minutes of walking everyday, or 3 hours every week) 2. Increase social interactions - continue going to [place] and enjoy social gatherings with friends and family 3. Eat healthy, avoid fried foods and eat more fruits and vegetables 4. Maintain adequate blood pressure, blood sugar, and blood cholesterol level. Reducing the risk of stroke and cardiovascular disease also helps promoting better memory. 5. Avoid stressful situations. Live a simple life and avoid aggravations. Organize your time and prepare for the next day in anticipation. 6. Sleep well, avoid any interruptions of sleep and avoid any distractions in the bedroom that may interfere with adequate sleep quality 7. Avoid sugar, avoid sweets as there is a strong link between excessive sugar intake, diabetes, and cognitive impairment We discussed the Mediterranean diet, which has been shown to help patients reduce the risk of progressive memory disorders and reduces cardiovascular risk. This includes eating fish, eat fruits and green leafy vegetables, nuts like almonds and hazelnuts, walnuts, and also use olive oil. Avoid fast foods and fried foods as much as possible. Avoid sweets and sugar as sugar use has been linked to worsening of memory function.  There is always a concern of gradual progression of memory problems. If this is the case, then we may need to adjust level of care according to patient needs. Support, both to the patient and caregiver, should then be put into place.      FALL PRECAUTIONS: Be cautious when walking. Scan the area for obstacles that may increase the risk of trips and falls. When getting up in the mornings, sit up at the edge of the bed for a few minutes before getting out  of bed. Consider elevating the bed at the head end to avoid drop of blood pressure when getting up. Walk always in a well-lit room (use night lights in the walls). Avoid area rugs or power cords from appliances in the middle of the walkways. Use a walker or a cane if necessary and consider physical therapy for balance exercise. Get your eyesight checked regularly.   HOME SAFETY: Consider the safety of the kitchen when operating appliances like stoves, microwave oven, and blender. Consider having supervision and share cooking responsibilities until no longer able to participate in those. Accidents with firearms and other hazards in the house should be identified and addressed as well.   DRIVING: Regarding driving, in patients with progressive memory problems, driving will be impaired. We advise to have someone else do the driving if trouble finding directions or if minor accidents are reported. Independent driving assessment is available to determine safety of driving.       Mediterranean Diet A Mediterranean diet refers to food and lifestyle choices that are based on the traditions of countries located on the [employer]. This way of eating has been shown to help prevent certain conditions and improve outcomes for people who have chronic diseases, like kidney disease and heart disease. What are tips for following this plan? Lifestyle  Cook and eat meals together with your family, when possible. Drink enough fluid to keep your urine clear or pale yellow. Be physically active every day. This includes: Aerobic exercise like running or swimming. Leisure activities like gardening, walking, or housework. Get  7-8 hours of sleep each night. If recommended by your health care provider, drink red wine in moderation. This means 1 glass a day for nonpregnant women and 2 glasses a day for men. A glass of wine equals 5 oz (150 mL). Reading food labels  Check the serving size of packaged foods. For  foods such as rice and pasta, the serving size refers to the amount of cooked product, not dry. Check the total fat in packaged foods. Avoid foods that have saturated fat or trans fats. Check the ingredients list for added sugars, such as corn syrup. Shopping  At the grocery store, buy most of your food from the areas near the walls of the store. This includes: Fresh fruits and vegetables (produce). Grains, beans, nuts, and seeds. Some of these may be available in unpackaged forms or large amounts (in bulk). Fresh seafood. Poultry and eggs. Low-fat dairy products. Buy whole ingredients instead of prepackaged foods. Buy fresh fruits and vegetables in-season from local farmers markets. Buy frozen fruits and vegetables in resealable bags. If you do not have access to quality fresh seafood, buy precooked frozen shrimp or canned fish, such as tuna, salmon, or sardines. Buy small amounts of raw or cooked vegetables, salads, or olives from the deli or salad bar at your store. Stock your pantry so you always have certain foods on hand, such as olive oil, canned tuna, canned tomatoes, rice, pasta, and beans. Cooking  Cook foods with extra-virgin olive oil instead of using butter or other vegetable oils. Have meat as a side dish, and have vegetables or grains as your main dish. This means having meat in small portions or adding small amounts of meat to foods like pasta or stew. Use beans or vegetables instead of meat in common dishes like chili or lasagna. Experiment with different cooking methods. Try roasting or broiling vegetables instead of steaming or sauteing them. Add frozen vegetables to soups, stews, pasta, or rice. Add nuts or seeds for added healthy fat at each meal. You can add these to yogurt, salads, or vegetable dishes. Marinate fish or vegetables using olive oil, lemon juice, garlic, and fresh herbs. Meal planning  Plan to eat 1 vegetarian meal one day each week. Try to work up to 2  vegetarian meals, if possible. Eat seafood 2 or more times a week. Have healthy snacks readily available, such as: Vegetable sticks with hummus. Greek yogurt. Fruit and nut trail mix. Eat balanced meals throughout the week. This includes: Fruit: 2-3 servings a day Vegetables: 4-5 servings a day Low-fat dairy: 2 servings a day Fish, poultry, or lean meat: 1 serving a day Beans and legumes: 2 or more servings a week Nuts and seeds: 1-2 servings a day Whole grains: 6-8 servings a day Extra-virgin olive oil: 3-4 servings a day Limit red meat and sweets to only a few servings a month What are my food choices? Mediterranean diet Recommended Grains: Whole-grain pasta. Brown rice. Bulgar wheat. Polenta. Couscous. Whole-wheat bread. [place]. Vegetables: Artichokes. Beets. Broccoli. Cabbage. Carrots. Eggplant. Green beans. Chard. Kale. Spinach. Onions. Leeks. Peas. Squash. Tomatoes. Peppers. Radishes. Fruits: Apples. Apricots. Avocado. Berries. Bananas. Cherries. Dates. Figs. Grapes. Lemons. Melon. Oranges. Peaches. Plums. Pomegranate. Meats and other protein foods: Beans. Almonds. Sunflower seeds. Pine nuts. Peanuts. [place]. Salmon. Scallops. Shrimp. [place]. Tilapia. Clams. Oysters. Eggs. Dairy: Low-fat milk. Cheese. Greek yogurt. Beverages: Water. Red wine. Herbal tea. Fats and oils: Extra virgin olive oil. Avocado oil. Grape seed oil. Sweets and desserts: [place] yogurt with  honey. Baked apples. Poached pears. Trail mix. Seasoning and other foods: Basil. Cilantro. Coriander. Cumin. Mint. Parsley. Sage. Rosemary. Tarragon. Garlic. Oregano. Thyme. Pepper. Balsalmic vinegar. Tahini. Hummus. Tomato sauce. Olives. Mushrooms. Limit these Grains: Prepackaged pasta or rice dishes. Prepackaged cereal with added sugar. Vegetables: Deep fried potatoes (french fries). Fruits: Fruit canned in syrup. Meats and other protein foods: Beef. Pork. Lamb. Poultry with skin. Hot dogs. Berniece Salines. Dairy: Ice cream.  Sour cream. Whole milk. Beverages: Juice. Sugar-sweetened soft drinks. Beer. Liquor and spirits. Fats and oils: Butter. Canola oil. Vegetable oil. Beef fat (tallow). Lard. Sweets and desserts: Cookies. Cakes. Pies. Candy. Seasoning and other foods: Mayonnaise. Premade sauces and marinades. The items listed may not be a complete list. Talk with your dietitian about what dietary choices are right for you. Summary The Mediterranean diet includes both food and lifestyle choices. Eat a variety of fresh fruits and vegetables, beans, nuts, seeds, and whole grains. Limit the amount of red meat and sweets that you eat. Talk with your health care provider about whether it is safe for you to drink red wine in moderation. This means 1 glass a day for nonpregnant women and 2 glasses a day for men. A glass of wine equals 5 oz (150 mL). This information is not intended to replace advice given to you by your health care provider. Make sure you discuss any questions you have with your health care provider. Document Released: 04/28/2016 Document Revised: 05/31/2016 Document Reviewed: 04/28/2016 Elsevier Interactive Patient Education  2017 [employer].

## 2021-06-29 NOTE — Progress Notes (Signed)
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 Last MOCA blind on Oct 08, 2019 18/22. He is on Donepezil 10 mg daily.  Symptoms are overall stable.   Discussed safety both in and out of the home. Discussed the importance of regular daily schedule with inclusion of crossword puzzles to maintain brain function.  Continue to stay active exercising  at least 30 minutes at least 3 times a week.  Naps should be scheduled and should be no longer than 60 minutes and should not occur after 2 PM.  Mediterranean diet is recommended information was given in his AVS Follow-up in 6 months with MMSE Continue donepezil 10 mg daily  Repeat neurocognitive testing after next visit   Case discussed with Dr. Delice Lesch who agrees with the plan   Subjective:   Andrew Ramos was seen today in follow up for memory loss.  The patient is here alone My previous records as well as any outside records available were reviewed prior to todays visit.  Patient currently on Donepezil 10 mg daily and tolerating well.   He reports feeling well, he does not report any specific complaints. He continues to live with his wife at  [facility] near [place].  He denies any dizziness, unilateral weakness, or headaches.  He denies any recent falls and does not walk with a walker,  occasionally he uses a cane.  He remains active, participates in a walking program of 30 minutes to 1 hour, plays card games with his friends. He no longer likes to sing in [place].  He denies any paranoia or hallucinations. He sleeps "like a log "between 6 and 8 hours, and only naps about 30 minutes a day.  He reports some  vivid dreams more frequently but denies REM behavior, no sleepwalking, His appetite is good, denies any trouble swallowing, nausea or vomiting.  He reports intermittent constipation, but he takes laxative with good results.  He cooks, but denies leaving the stove on or any other appliances or faucets on.  He denies any urinary incontinence.  Takes meds by himself, denies missing any doses or overdosing.  He bathes and changes clothes independently.  His mood is good and cheerful, denies any episodes of depression. He pays his own bills, and denies missing any, as he has automatic payments.  He continues to drive, denies finding himself lost, denies disorientation.           History on Initial Assessment 09/23/2016: This is a pleasant 87 yo RH man with a history of hypertension, hyperlipidemia, prostate cancer, melanoma, presenting for evaluation of worsening memory and personality changes. [name] himself thinks that he is doing okay, but "people keep telling me I have too many things to think about." He only mostly notices memory problems when he is in a hurry. He states he occasionally forgets conversations but feels this is due to his poor hearing. He denies getting lost driving, no missed medications. He has missed some bill payments a couple of years ago, none recently. His son states he is not nearly as sharp as he once was, he misplaces things frequently and asks the same questions repeatedly. He relates what the patient's wife has told him, that he is more  argumentative. He has left the stove burner on at least twice in the past couple of weeks. He has walked away from the sink running. They have noticed more dents on the car, which the patient is unsure how they happened. They both wonder if these were from parking lot incidents because he states he has never hit another car. No difficulties with dressing/bathing independently, hygiene is good. On review of PCP notes where wife was present, she  reported he forgets to zip his pants, he forgets things he has offered his wife. He brings the wrong utensils to eat with. He forgets simple things his wife asks for. He states that she give him too many instructions. He loses temper with his wife frequently, they are fighting more and she is tired of people telling her to be more patient with him. He was noted to have a blunt affect with paranoid thought content on his PCP visit. MMSE in 06/2016 was 30/30. He was started on Donepezil 5mg  daily, which he is tolerating without side effects.    He denies any headaches, dizziness, diplopia, dysarthria, dysphagia, neck/back pain, focal numbness/tingling/weakness, bladder dysfunction. No anosmia, tremors, no falls. He has occasional constipation. His brother who is 7 years younger than him was diagnosed with dementia after he got lost driving and could not be found for 2 weeks. He denies any significant head injuries, he drinks alcohol very occasionally.    Diagnostic Data: MRI brain with and without contrast did not show any acute changes. There was moderate diffuse volume loss and mild chronic microvascular disease.   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 Results and level of functioning did not warrant a diagnosis of dementia. No signs of a primary psychiatric disorder. Increased irritability/mood changes are likely related to MCI and frontal-subcortical involvement.     PREVIOUS MEDICATIONS: None  CURRENT MEDICATIONS:  Outpatient Encounter Medications as of 06/29/2021  Medication Sig   atorvastatin (LIPITOR) 40 MG tablet Take 40 mg by mouth daily.   Calcium Carbonate-Vitamin D (CALCIUM 600 + D PO) Take 1 tablet by mouth every morning.    Cholecalciferol (VITAMIN D3) 1000 UNITS tablet Take 1,000 Units by mouth every morning.   docusate sodium (COLACE) 100 MG capsule Take 200 mg by mouth daily as  needed for mild constipation.   donepezil (ARICEPT) 10 MG tablet Take 1 tablet (10 mg total) by mouth at bedtime.   fluticasone (FLONASE) 50 MCG/ACT nasal spray Place 2 sprays into both nostrils daily.   hydroxychloroquine (PLAQUENIL) 200 MG tablet Take 200 mg by mouth 2 (two) times daily.   hydroxychloroquine (PLAQUENIL) 200 MG tablet Take 1 tablet by mouth 2 (two) times daily.   Iron, Ferrous Sulfate, 325 (65 Fe) MG TABS Take 325 mg by mouth daily.   lisinopril-hydrochlorothiazide (ZESTORETIC) 10-12.5 MG tablet TAKE 1 TABLET DAILY   loratadine (CLARITIN) 10 MG tablet TAKE 1 TABLET DAILY   Multiple Vitamins-Minerals (CENTRUM SILVER ULTRA MENS) TABS Take 1 tablet by mouth every evening.   rosuvastatin (CRESTOR) 40 MG tablet Take 0.5 tablets by mouth daily.   terazosin (HYTRIN) 1 MG capsule TAKE 1 CAPSULE AT BEDTIME   venlafaxine XR (EFFEXOR-XR) 75 MG 24 hr capsule Take 1 capsule (75 mg total) by mouth daily with breakfast.   No facility-administered encounter medications on file as of 06/29/2021.     Objective:     PHYSICAL EXAMINATION:    VITALS:   Vitals:   06/29/21  0930  BP: (!) 141/79  Pulse: 93  Resp: 18  SpO2: 98%  Weight: 173 lb (78.5 kg)  Height: 6' (1.829 m)    GEN:  The patient appears stated age and is in NAD.  HEENT:  Normocephalic, atraumatic.   Neurological examination:  Orientation: The patient is alert. Oriented to person, place and date Cranial nerves: There is good facial symmetry.The speech is fluent and clear.  Hearing is intact to conversational tone with hearing aids.  Sensation: Sensation is intact to light touch throughout Motor: Strength is at least antigravity x4.  Movement examination: Tone: There is normal tone in the UE/LE Abnormal movements:  very mild RUE hand intention tremor.  No myoclonus.  No asterixis.  No cogwheeling Coordination:  There is no decremation with RAM's. Gait and Station: The patient has no difficulty arising out of a  deep-seated chair without the use of the hands. The patient's stride length is good.    MMSE - Mini Mental State Exam 09/25/2017 05/08/2017 03/20/2017  Orientation to time 5 5 5   Orientation to Place 5 5 5   Registration 3 3 3   Attention/ Calculation 5 5 5   Recall 2 3 3   Language- name 2 objects 2 2 2   Language- repeat 1 1 1   Language- follow 3 step command 3 3 3   Language- read & follow direction 1 1 1   Write a sentence 1 1 1   Copy design 1 1 1   Total score 29 30 30     Montreal Cognitive Assessment Blind 10/08/2019 03/01/2019 09/23/2016  Attention: Read list of digits (0/2) 2 2 2   Attention: Read list of letters (0/1) 1 1 1   Attention: Serial 7 subtraction starting at 100 (0/3) 3 3 3   Language: Repeat phrase (0/2) 0 0 2  Language : Fluency (0/1) 0 0 0  Abstraction (0/2) 1 0 2  Delayed Recall (0/5) 5 0 2  Orientation (0/6) 6 6 6   Total 18 12 -  Adjusted Score (based on education) - 13 -      Total time spent on today's visit was 30  minutes, including both face-to-face time and nonface-to-face time.  Time included that spent on review of records (prior notes available to me/labs/imaging if pertinent), discussing treatment and goals, answering patient's questions and coordinating care.  Cc:  Carollee Herter, Alferd Apa, DO Sharene Butters, PA-C

## 2021-07-02 ENCOUNTER — Inpatient Hospital Stay (HOSPITAL_BASED_OUTPATIENT_CLINIC_OR_DEPARTMENT_OTHER): Payer: Medicare Other | Admitting: Hematology & Oncology

## 2021-07-02 ENCOUNTER — Inpatient Hospital Stay: Payer: Medicare Other | Attending: Hematology & Oncology

## 2021-07-02 ENCOUNTER — Inpatient Hospital Stay: Payer: Medicare Other

## 2021-07-02 ENCOUNTER — Encounter: Payer: Self-pay | Admitting: Hematology & Oncology

## 2021-07-02 ENCOUNTER — Other Ambulatory Visit: Payer: Self-pay

## 2021-07-02 VITALS — BP 135/88 | HR 91 | Temp 97.7°F | Resp 16 | Wt 173.0 lb

## 2021-07-02 DIAGNOSIS — C9310 Chronic myelomonocytic leukemia not having achieved remission: Secondary | ICD-10-CM

## 2021-07-02 DIAGNOSIS — M7989 Other specified soft tissue disorders: Secondary | ICD-10-CM | POA: Diagnosis not present

## 2021-07-02 DIAGNOSIS — D860 Sarcoidosis of lung: Secondary | ICD-10-CM | POA: Insufficient documentation

## 2021-07-02 DIAGNOSIS — D630 Anemia in neoplastic disease: Secondary | ICD-10-CM | POA: Insufficient documentation

## 2021-07-02 DIAGNOSIS — D4620 Refractory anemia with excess of blasts, unspecified: Secondary | ICD-10-CM

## 2021-07-02 DIAGNOSIS — Z79899 Other long term (current) drug therapy: Secondary | ICD-10-CM | POA: Insufficient documentation

## 2021-07-02 LAB — CMP (CANCER CENTER ONLY)
ALT: 17 U/L (ref 0–44)
AST: 19 U/L (ref 15–41)
Albumin: 4.3 g/dL (ref 3.5–5.0)
Alkaline Phosphatase: 61 U/L (ref 38–126)
Anion gap: 5 (ref 5–15)
BUN: 21 mg/dL (ref 8–23)
CO2: 32 mmol/L (ref 22–32)
Calcium: 10.5 mg/dL — ABNORMAL HIGH (ref 8.9–10.3)
Chloride: 103 mmol/L (ref 98–111)
Creatinine: 1.06 mg/dL (ref 0.61–1.24)
GFR, Estimated: >60 mL/min (ref >60)
Glucose, Bld: 95 mg/dL (ref 70–99)
Potassium: 4.1 mmol/L (ref 3.5–5.1)
Sodium: 140 mmol/L (ref 135–145)
Total Bilirubin: 0.5 mg/dL (ref 0.3–1.2)
Total Protein: 6.3 g/dL — ABNORMAL LOW (ref 6.5–8.1)

## 2021-07-02 LAB — CBC WITH DIFFERENTIAL (CANCER CENTER ONLY)
Abs Immature Granulocytes: 0 10*3/uL (ref 0.00–0.07)
Basophils Absolute: 0 10*3/uL (ref 0.0–0.1)
Basophils Relative: 0 %
Eosinophils Absolute: 0 10*3/uL (ref 0.0–0.5)
Eosinophils Relative: 1 %
HCT: 31.8 % — ABNORMAL LOW (ref 39.0–52.0)
Hemoglobin: 10.5 g/dL — ABNORMAL LOW (ref 13.0–17.0)
Lymphocytes Relative: 42 %
Lymphs Abs: 2.1 10*3/uL (ref 0.7–4.0)
MCH: 35.8 pg — ABNORMAL HIGH (ref 26.0–34.0)
MCHC: 33 g/dL (ref 30.0–36.0)
MCV: 108.5 fL — ABNORMAL HIGH (ref 80.0–100.0)
Monocytes Absolute: 2.3 10*3/uL — ABNORMAL HIGH (ref 0.1–1.0)
Monocytes Relative: 47 %
Neutro Abs: 0.5 10*3/uL — ABNORMAL LOW (ref 1.7–7.7)
Neutrophils Relative %: 10 %
Platelet Count: 84 10*3/uL — ABNORMAL LOW (ref 150–400)
RBC: 2.93 MIL/uL — ABNORMAL LOW (ref 4.22–5.81)
RDW: 13.9 % (ref 11.5–15.5)
Smear Review: NORMAL
WBC Count: 4.9 10*3/uL (ref 4.0–10.5)
nRBC: 0 % (ref 0.0–0.2)

## 2021-07-02 LAB — LACTATE DEHYDROGENASE: LDH: 243 U/L — ABNORMAL HIGH (ref 98–192)

## 2021-07-02 LAB — RETICULOCYTES
Immature Retic Fract: 11 % (ref 2.3–15.9)
RBC.: 2.9 MIL/uL — ABNORMAL LOW (ref 4.22–5.81)
Retic Count, Absolute: 28.4 10*3/uL (ref 19.0–186.0)
Retic Ct Pct: 1 % (ref 0.4–3.1)

## 2021-07-02 LAB — SAVE SMEAR(SSMR), FOR PROVIDER SLIDE REVIEW

## 2021-07-02 MED ORDER — DARBEPOETIN ALFA 300 MCG/0.6ML IJ SOSY
300.0000 ug | PREFILLED_SYRINGE | Freq: Once | INTRAMUSCULAR | Status: AC
  Administered 2021-07-02: 300 ug via SUBCUTANEOUS
  Filled 2021-07-02: qty 0.6

## 2021-07-02 NOTE — Progress Notes (Signed)
And Hematology and Oncology Follow Up Visit  Andrew Ramos [id number] Sep 01, 1932 87 y.o. 07/02/2021   Principle Diagnosis:  Chronic myelomonocytic leukemia --intermediate 2  risk  Current Therapy:   Aranesp 300 mcg sq q 3 week for Hgb <11 IV Iron -- Feraheme given on 04/09/2021     Interim History:  Andrew Ramos is back for follow-up.  He looks quite good.  He has been seeing quite a few doctors this week.  He did see a dermatologist on Monday.  He had a couple skin lesions removed from his leg.  Apparently they were likely melanoma in situ.  He says he got had to go back to have more surgery.  He has done very nicely with the Aranesp.  We have not given Aranesp since August.  When we last saw him in September, his ferritin was 112 with an iron saturation of 25%.  He has had no problems with bleeding or bruising.  He has had a little constipation.  His appetite is doing pretty well.  He has had no nausea or vomiting.  There is no cough or shortness of breath.  He has had no rashes.  Overall, his performance status is ECOG 2.     Medications:  Current Outpatient Medications:    atorvastatin (LIPITOR) 40 MG tablet, Take 40 mg by mouth daily., Disp: , Rfl:    Calcium Carbonate-Vitamin D (CALCIUM 600 + D PO), Take 1 tablet by mouth every morning. , Disp: , Rfl:    Cholecalciferol (VITAMIN D3) 1000 UNITS tablet, Take 1,000 Units by mouth every morning., Disp: , Rfl:    COVID-19 mRNA bivalent vaccine, Moderna, (MODERNA COVID-19 BIVAL BOOSTER) 50 MCG/0.5ML injection, Inject into the muscle., Disp: 0.5 mL, Rfl: 0   docusate sodium (COLACE) 100 MG capsule, Take 200 mg by mouth daily as needed for mild constipation., Disp: , Rfl:    donepezil (ARICEPT) 10 MG tablet, Take 1 tablet (10 mg total) by mouth at bedtime., Disp: 90 tablet, Rfl: 3   fluticasone (FLONASE) 50 MCG/ACT nasal spray, Place 2 sprays into both nostrils daily., Disp: 16 g, Rfl: 6   hydroxychloroquine (PLAQUENIL) 200 MG  tablet, Take 200 mg by mouth 2 (two) times daily., Disp: , Rfl:    hydroxychloroquine (PLAQUENIL) 200 MG tablet, Take 1 tablet by mouth 2 (two) times daily., Disp: , Rfl:    Iron, Ferrous Sulfate, 325 (65 Fe) MG TABS, Take 325 mg by mouth daily., Disp: 30 tablet, Rfl:    lisinopril-hydrochlorothiazide (ZESTORETIC) 10-12.5 MG tablet, TAKE 1 TABLET DAILY, Disp: 90 tablet, Rfl: 3   loratadine (CLARITIN) 10 MG tablet, TAKE 1 TABLET DAILY, Disp: 90 tablet, Rfl: 3   Multiple Vitamins-Minerals (CENTRUM SILVER ULTRA MENS) TABS, Take 1 tablet by mouth every evening., Disp: , Rfl:    rosuvastatin (CRESTOR) 40 MG tablet, Take 0.5 tablets by mouth daily., Disp: , Rfl:    terazosin (HYTRIN) 1 MG capsule, TAKE 1 CAPSULE AT BEDTIME, Disp: 90 capsule, Rfl: 3   venlafaxine XR (EFFEXOR-XR) 75 MG 24 hr capsule, Take 1 capsule (75 mg total) by mouth daily with breakfast., Disp: 90 capsule, Rfl: 3  Allergies: No Known Allergies  Past Medical History, Surgical history, Social history, and Family History were reviewed and updated.  Review of Systems: Review of Systems  Constitutional: Negative.   HENT:  Negative.    Eyes: Negative.   Respiratory: Negative.    Cardiovascular: Negative.   Gastrointestinal: Negative.   Endocrine: Negative.   Genitourinary: Negative.  Musculoskeletal: Negative.   Skin: Negative.   Neurological: Negative.   Hematological: Negative.   Psychiatric/Behavioral: Negative.     Physical Exam:  weight is 173 lb (78.5 kg). His oral temperature is 97.7 F (36.5 C). His blood pressure is 135/88 and his pulse is 91. His respiration is 16 and oxygen saturation is 100%.   Wt Readings from Last 3 Encounters:  07/02/21 173 lb (78.5 kg)  06/29/21 173 lb (78.5 kg)  06/07/21 175 lb 12.8 oz (79.7 kg)    Physical Exam Vitals reviewed.  HENT:     Head: Normocephalic and atraumatic.  Eyes:     Pupils: Pupils are equal, round, and reactive to light.  Cardiovascular:     Rate and  Rhythm: Normal rate and regular rhythm.     Heart sounds: Normal heart sounds.  Pulmonary:     Effort: Pulmonary effort is normal.     Breath sounds: Normal breath sounds.  Abdominal:     General: Bowel sounds are normal.     Palpations: Abdomen is soft.  Musculoskeletal:        General: No tenderness or deformity. Normal range of motion.     Cervical back: Normal range of motion.  Lymphadenopathy:     Cervical: No cervical adenopathy.  Skin:    General: Skin is warm and dry.     Findings: No erythema or rash.  Neurological:     Mental Status: He is alert and oriented to person, place, and time.  Psychiatric:        Behavior: Behavior normal.        Thought Content: Thought content normal.        Judgment: Judgment normal.     Lab Results  Component Value Date   WBC 4.9 07/02/2021   HGB 10.5 (L) 07/02/2021   HCT 31.8 (L) 07/02/2021   MCV 108.5 (H) 07/02/2021   PLT 84 (L) 07/02/2021     Chemistry      Component Value Date/Time   NA 141 05/20/2021 1346   NA 144 02/26/2021 0000   K 4.6 05/20/2021 1346   CL 103 05/20/2021 1346   CO2 30 05/20/2021 1346   BUN 21 05/20/2021 1346   BUN 21 02/26/2021 0000   CREATININE 1.17 05/20/2021 1346   CREATININE 1.14 (H) 06/04/2020 1041   GLU 94 02/26/2021 0000      Component Value Date/Time   CALCIUM 10.3 05/20/2021 1346   ALKPHOS 61 05/20/2021 1346   AST 20 05/20/2021 1346   ALT 14 05/20/2021 1346   BILITOT 0.6 05/20/2021 1346      Impression and Plan: Andrew Ramos is a very nice 87 year old white male.  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  He served in [place].  As such, is a true [employer] hero.  We will give him Aranesp today.  I want to make sure that we keep his blood counts as good as possible for the holidays coming up..  I will plan to see him back right before Thanksgiving.  I would like to make sure that he is going to be doing okay.  He was blood counts look good, the maybe we can  get him through the holiday season.  Hopefully, the dermatologist will not find any invasive melanoma in the follow-up surgeries.    Volanda Napoleon, MD 10/14/20221:53 PM

## 2021-07-02 NOTE — Patient Instructions (Signed)
Darbepoetin Alfa injection What is this medication? DARBEPOETIN ALFA (dar be POE e tin AL fa) helps your body make more red blood cells. It is used to treat anemia caused by chronic kidney failure and chemotherapy. This medicine may be used for other purposes; ask your health care provider or pharmacist if you have questions. COMMON BRAND NAME(S): Aranesp What should I tell my care team before I take this medication? They need to know if you have any of these conditions: blood clotting disorders or history of blood clots cancer patient not on chemotherapy cystic fibrosis heart disease, such as angina, heart failure, or a history of a heart attack hemoglobin level of 12 g/dL or greater high blood pressure low levels of folate, iron, or vitamin B12 seizures an unusual or allergic reaction to darbepoetin, erythropoietin, albumin, hamster proteins, latex, other medicines, foods, dyes, or preservatives pregnant or trying to get pregnant breast-feeding How should I use this medication? This medicine is for injection into a vein or under the skin. It is usually given by a health care professional in a hospital or clinic setting. If you get this medicine at home, you will be taught how to prepare and give this medicine. Use exactly as directed. Take your medicine at regular intervals. Do not take your medicine more often than directed. It is important that you put your used needles and syringes in a special sharps container. Do not put them in a trash can. If you do not have a sharps container, call your pharmacist or healthcare provider to get one. A special MedGuide will be given to you by the pharmacist with each prescription and refill. Be sure to read this information carefully each time. Talk to your pediatrician regarding the use of this medicine in children. While this medicine may be used in children as young as 1 month of age for selected conditions, precautions do apply. Overdosage: If you  think you have taken too much of this medicine contact a poison control center or emergency room at once. NOTE: This medicine is only for you. Do not share this medicine with others. What if I miss a dose? If you miss a dose, take it as soon as you can. If it is almost time for your next dose, take only that dose. Do not take double or extra doses. What may interact with this medication? Do not take this medicine with any of the following medications: epoetin alfa This list may not describe all possible interactions. Give your health care provider a list of all the medicines, herbs, non-prescription drugs, or dietary supplements you use. Also tell them if you smoke, drink alcohol, or use illegal drugs. Some items may interact with your medicine. What should I watch for while using this medication? Your condition will be monitored carefully while you are receiving this medicine. You may need blood work done while you are taking this medicine. This medicine may cause a decrease in vitamin B6. You should make sure that you get enough vitamin B6 while you are taking this medicine. Discuss the foods you eat and the vitamins you take with your health care professional. What side effects may I notice from receiving this medication? Side effects that you should report to your doctor or health care professional as soon as possible: allergic reactions like skin rash, itching or hives, swelling of the face, lips, or tongue breathing problems changes in vision chest pain confusion, trouble speaking or understanding feeling faint or lightheaded, falls high blood pressure   muscle aches or pains pain, swelling, warmth in the leg rapid weight gain severe headaches sudden numbness or weakness of the face, arm or leg trouble walking, dizziness, loss of balance or coordination seizures (convulsions) swelling of the ankles, feet, hands unusually weak or tired Side effects that usually do not require medical  attention (report to your doctor or health care professional if they continue or are bothersome): diarrhea fever, chills (flu-like symptoms) headaches nausea, vomiting redness, stinging, or swelling at site where injected This list may not describe all possible side effects. Call your doctor for medical advice about side effects. You may report side effects to FDA at 1-800-FDA-1088. Where should I keep my medication? Keep out of the reach of children. Store in a refrigerator between 2 and 8 degrees C (36 and 46 degrees F). Do not freeze. Do not shake. Throw away any unused portion if using a single-dose vial. Throw away any unused medicine after the expiration date. NOTE: This sheet is a summary. It may not cover all possible information. If you have questions about this medicine, talk to your doctor, pharmacist, or health care provider.  2022 Elsevier/Gold Standard (2017-09-20 16:44:20)  

## 2021-07-05 LAB — IRON AND TIBC
Iron: 112 ug/dL (ref 42–163)
Saturation Ratios: 42 % (ref 20–55)
TIBC: 270 ug/dL (ref 202–409)
UIBC: 158 ug/dL (ref 117–376)

## 2021-07-05 LAB — FERRITIN: Ferritin: 232 ng/mL (ref 24–336)

## 2021-07-07 ENCOUNTER — Other Ambulatory Visit: Payer: Self-pay | Admitting: Family Medicine

## 2021-07-28 ENCOUNTER — Telehealth: Payer: Self-pay | Admitting: Family Medicine

## 2021-07-28 NOTE — Telephone Encounter (Signed)
Medication: atorvastatin (LIPITOR) 40 MG tablet  Has the patient contacted their pharmacy? No. (If no, request that the patient contact the pharmacy for the refill.) (If yes, when and what did the pharmacy advise?)  Preferred Pharmacy (with phone number or street name):  [facility] [place]  [address] [place] [address]  Phone:  [phone]  Fax:  [phone]   Agent: Please be advised that RX refills may take up to 3 business days. We ask that you follow-up with your pharmacy.

## 2021-07-29 MED ORDER — ATORVASTATIN CALCIUM 40 MG PO TABS: 40.0000 mg | ORAL_TABLET | Freq: Every day | ORAL | 1 refills | Status: DC

## 2021-07-29 NOTE — Telephone Encounter (Signed)
Refill sent.

## 2021-08-05 DIAGNOSIS — L905 Scar conditions and fibrosis of skin: Secondary | ICD-10-CM | POA: Diagnosis not present

## 2021-08-05 DIAGNOSIS — L820 Inflamed seborrheic keratosis: Secondary | ICD-10-CM | POA: Diagnosis not present

## 2021-08-05 DIAGNOSIS — D239 Other benign neoplasm of skin, unspecified: Secondary | ICD-10-CM | POA: Diagnosis not present

## 2021-08-05 DIAGNOSIS — Z23 Encounter for immunization: Secondary | ICD-10-CM | POA: Diagnosis not present

## 2021-08-10 ENCOUNTER — Inpatient Hospital Stay (HOSPITAL_BASED_OUTPATIENT_CLINIC_OR_DEPARTMENT_OTHER): Payer: Medicare Other | Admitting: Hematology & Oncology

## 2021-08-10 ENCOUNTER — Other Ambulatory Visit: Payer: Self-pay

## 2021-08-10 ENCOUNTER — Inpatient Hospital Stay: Payer: Medicare Other

## 2021-08-10 ENCOUNTER — Encounter: Payer: Self-pay | Admitting: Hematology & Oncology

## 2021-08-10 ENCOUNTER — Inpatient Hospital Stay: Payer: Medicare Other | Attending: Hematology & Oncology

## 2021-08-10 VITALS — BP 106/52 | HR 86 | Resp 18

## 2021-08-10 VITALS — BP 119/65 | HR 96 | Temp 97.5°F | Resp 18 | Wt 169.0 lb

## 2021-08-10 DIAGNOSIS — Z79899 Other long term (current) drug therapy: Secondary | ICD-10-CM | POA: Insufficient documentation

## 2021-08-10 DIAGNOSIS — C9310 Chronic myelomonocytic leukemia not having achieved remission: Secondary | ICD-10-CM

## 2021-08-10 DIAGNOSIS — D860 Sarcoidosis of lung: Secondary | ICD-10-CM | POA: Insufficient documentation

## 2021-08-10 DIAGNOSIS — D4620 Refractory anemia with excess of blasts, unspecified: Secondary | ICD-10-CM

## 2021-08-10 LAB — CMP (CANCER CENTER ONLY)
ALT: 24 U/L (ref 0–44)
AST: 27 U/L (ref 15–41)
Albumin: 4.2 g/dL (ref 3.5–5.0)
Alkaline Phosphatase: 59 U/L (ref 38–126)
Anion gap: 9 (ref 5–15)
BUN: 30 mg/dL — ABNORMAL HIGH (ref 8–23)
CO2: 30 mmol/L (ref 22–32)
Calcium: 10.7 mg/dL — ABNORMAL HIGH (ref 8.9–10.3)
Chloride: 103 mmol/L (ref 98–111)
Creatinine: 1.23 mg/dL (ref 0.61–1.24)
GFR, Estimated: 57 mL/min — ABNORMAL LOW (ref >60)
Glucose, Bld: 116 mg/dL — ABNORMAL HIGH (ref 70–99)
Potassium: 4.5 mmol/L (ref 3.5–5.1)
Sodium: 142 mmol/L (ref 135–145)
Total Bilirubin: 0.9 mg/dL (ref 0.3–1.2)
Total Protein: 6.9 g/dL (ref 6.5–8.1)

## 2021-08-10 LAB — CBC WITH DIFFERENTIAL (CANCER CENTER ONLY)
Abs Immature Granulocytes: 0.06 10*3/uL (ref 0.00–0.07)
Basophils Absolute: 0 10*3/uL (ref 0.0–0.1)
Basophils Relative: 0 %
Eosinophils Absolute: 0 10*3/uL (ref 0.0–0.5)
Eosinophils Relative: 0 %
HCT: 31.8 % — ABNORMAL LOW (ref 39.0–52.0)
Hemoglobin: 10.6 g/dL — ABNORMAL LOW (ref 13.0–17.0)
Immature Granulocytes: 1 %
Lymphocytes Relative: 32 %
Lymphs Abs: 2 10*3/uL (ref 0.7–4.0)
MCH: 36.3 pg — ABNORMAL HIGH (ref 26.0–34.0)
MCHC: 33.3 g/dL (ref 30.0–36.0)
MCV: 108.9 fL — ABNORMAL HIGH (ref 80.0–100.0)
Monocytes Absolute: 3 10*3/uL — ABNORMAL HIGH (ref 0.1–1.0)
Monocytes Relative: 48 %
Neutro Abs: 1.2 10*3/uL — ABNORMAL LOW (ref 1.7–7.7)
Neutrophils Relative %: 19 %
Platelet Count: 80 10*3/uL — ABNORMAL LOW (ref 150–400)
RBC: 2.92 MIL/uL — ABNORMAL LOW (ref 4.22–5.81)
RDW: 14 % (ref 11.5–15.5)
WBC Count: 6.3 10*3/uL (ref 4.0–10.5)
nRBC: 0 % (ref 0.0–0.2)

## 2021-08-10 LAB — RETICULOCYTES
Immature Retic Fract: 11.3 % (ref 2.3–15.9)
RBC.: 2.86 MIL/uL — ABNORMAL LOW (ref 4.22–5.81)
Retic Count, Absolute: 24.6 10*3/uL (ref 19.0–186.0)
Retic Ct Pct: 0.9 % (ref 0.4–3.1)

## 2021-08-10 MED ORDER — DARBEPOETIN ALFA 300 MCG/0.6ML IJ SOSY
300.0000 ug | PREFILLED_SYRINGE | Freq: Once | INTRAMUSCULAR | Status: AC
  Administered 2021-08-10: 300 ug via SUBCUTANEOUS
  Filled 2021-08-10: qty 0.6

## 2021-08-10 NOTE — Patient Instructions (Signed)
Darbepoetin Alfa injection ?What is this medication? ?DARBEPOETIN ALFA (dar be POE e tin  AL fa) helps your body make more red blood cells. It is used to treat anemia caused by chronic kidney failure and chemotherapy. ?This medicine may be used for other purposes; ask your health care provider or pharmacist if you have questions. ?COMMON BRAND NAME(S): Aranesp ?What should I tell my care team before I take this medication? ?They need to know if you have any of these conditions: ?blood clotting disorders or history of blood clots ?cancer patient not on chemotherapy ?cystic fibrosis ?heart disease, such as angina, heart failure, or a history of a heart attack ?hemoglobin level of 12 g/dL or greater ?high blood pressure ?low levels of folate, iron, or vitamin B12 ?seizures ?an unusual or allergic reaction to darbepoetin, erythropoietin, albumin, hamster proteins, latex, other medicines, foods, dyes, or preservatives ?pregnant or trying to get pregnant ?breast-feeding ?How should I use this medication? ?This medicine is for injection into a vein or under the skin. It is usually given by a health care professional in a hospital or clinic setting. ?If you get this medicine at home, you will be taught how to prepare and give this medicine. Use exactly as directed. Take your medicine at regular intervals. Do not take your medicine more often than directed. ?It is important that you put your used needles and syringes in a special sharps container. Do not put them in a trash can. If you do not have a sharps container, call your pharmacist or healthcare provider to get one. ?A special MedGuide will be given to you by the pharmacist with each prescription and refill. Be sure to read this information carefully each time. ?Talk to your pediatrician regarding the use of this medicine in children. While this medicine may be used in children as young as 1 month of age for selected conditions, precautions do apply. ?Overdosage: If  you think you have taken too much of this medicine contact a poison control center or emergency room at once. ?NOTE: This medicine is only for you. Do not share this medicine with others. ?What if I miss a dose? ?If you miss a dose, take it as soon as you can. If it is almost time for your next dose, take only that dose. Do not take double or extra doses. ?What may interact with this medication? ?Do not take this medicine with any of the following medications: ?epoetin alfa ?This list may not describe all possible interactions. Give your health care provider a list of all the medicines, herbs, non-prescription drugs, or dietary supplements you use. Also tell them if you smoke, drink alcohol, or use illegal drugs. Some items may interact with your medicine. ?What should I watch for while using this medication? ?Your condition will be monitored carefully while you are receiving this medicine. ?You may need blood work done while you are taking this medicine. ?This medicine may cause a decrease in vitamin B6. You should make sure that you get enough vitamin B6 while you are taking this medicine. Discuss the foods you eat and the vitamins you take with your health care professional. ?What side effects may I notice from receiving this medication? ?Side effects that you should report to your doctor or health care professional as soon as possible: ?allergic reactions like skin rash, itching or hives, swelling of the face, lips, or tongue ?breathing problems ?changes in vision ?chest pain ?confusion, trouble speaking or understanding ?feeling faint or lightheaded, falls ?high blood   pressure ?muscle aches or pains ?pain, swelling, warmth in the leg ?rapid weight gain ?severe headaches ?sudden numbness or weakness of the face, arm or leg ?trouble walking, dizziness, loss of balance or coordination ?seizures (convulsions) ?swelling of the ankles, feet, hands ?unusually weak or tired ?Side effects that usually do not require  medical attention (report to your doctor or health care professional if they continue or are bothersome): ?diarrhea ?fever, chills (flu-like symptoms) ?headaches ?nausea, vomiting ?redness, stinging, or swelling at site where injected ?This list may not describe all possible side effects. Call your doctor for medical advice about side effects. You may report side effects to FDA at 1-800-FDA-1088. ?Where should I keep my medication? ?Keep out of the reach of children. ?Store in a refrigerator between 2 and 8 degrees C (36 and 46 degrees F). Do not freeze. Do not shake. Throw away any unused portion if using a single-dose vial. Throw away any unused medicine after the expiration date. ?NOTE: This sheet is a summary. It may not cover all possible information. If you have questions about this medicine, talk to your doctor, pharmacist, or health care provider. ?? 2022 Elsevier/Gold Standard (2017-09-25 00:00:00) ? ?

## 2021-08-10 NOTE — Progress Notes (Signed)
And Hematology and Oncology Follow Up Visit  Andrew Ramos [id number] [birth year] 87 y.o. 08/10/2021   Principle Diagnosis:  Chronic myelomonocytic leukemia --intermediate 2  risk  Current Therapy:   Aranesp 300 mcg sq q 3 week for Hgb <11 IV Iron -- Feraheme given on 04/09/2021     Interim History:  Andrew Ramos is back for follow-up.  He is looking pretty good.  He is feeling okay.  He is looking forward to a nice Thanksgiving.  When we last saw him, his iron studies showed a ferritin of 232 with an iron saturation of 42%.  He has had no problems with bleeding.  There is no fever.  He has had no nausea or vomiting.  He has had no problems with headache.  He has had no cough or shortness of breath.  There has been no leg swelling.  He has had no obvious change in bowel or bladder habits.  Overall, I would say his performance status is probably ECOG 2.    Medications:  Current Outpatient Medications:    atorvastatin (LIPITOR) 40 MG tablet, Take 1 tablet (40 mg total) by mouth daily., Disp: 90 tablet, Rfl: 1   Calcium Carbonate-Vitamin D (CALCIUM 600 + D PO), Take 1 tablet by mouth every morning. , Disp: , Rfl:    Cholecalciferol (VITAMIN D3) 1000 UNITS tablet, Take 1,000 Units by mouth every morning., Disp: , Rfl:    COVID-19 mRNA bivalent vaccine, Moderna, (MODERNA COVID-19 BIVAL BOOSTER) 50 MCG/0.5ML injection, Inject into the muscle., Disp: 0.5 mL, Rfl: 0   docusate sodium (COLACE) 100 MG capsule, Take 200 mg by mouth daily as needed for mild constipation., Disp: , Rfl:    donepezil (ARICEPT) 10 MG tablet, Take 1 tablet (10 mg total) by mouth at bedtime., Disp: 90 tablet, Rfl: 3   fluticasone (FLONASE) 50 MCG/ACT nasal spray, Place 2 sprays into both nostrils daily., Disp: 16 g, Rfl: 6   hydroxychloroquine (PLAQUENIL) 200 MG tablet, Take 1 tablet by mouth 2 (two) times daily., Disp: , Rfl:    Iron, Ferrous Sulfate, 325 (65 Fe) MG TABS, Take 325 mg by mouth daily., Disp: 30 tablet,  Rfl:    lisinopril-hydrochlorothiazide (ZESTORETIC) 10-12.5 MG tablet, TAKE 1 TABLET DAILY, Disp: 90 tablet, Rfl: 3   loratadine (CLARITIN) 10 MG tablet, TAKE 1 TABLET DAILY, Disp: 90 tablet, Rfl: 3   Multiple Vitamins-Minerals (CENTRUM SILVER ULTRA MENS) TABS, Take 1 tablet by mouth every evening., Disp: , Rfl:    rosuvastatin (CRESTOR) 40 MG tablet, Take 0.5 tablets by mouth daily., Disp: , Rfl:    terazosin (HYTRIN) 1 MG capsule, TAKE 1 CAPSULE AT BEDTIME, Disp: 90 capsule, Rfl: 3   venlafaxine XR (EFFEXOR-XR) 75 MG 24 hr capsule, Take 1 capsule (75 mg total) by mouth daily with breakfast., Disp: 90 capsule, Rfl: 3 No current facility-administered medications for this visit.  Facility-Administered Medications Ordered in Other Visits:    Darbepoetin Alfa (ARANESP) injection 300 mcg, 300 mcg, Subcutaneous, Once, Gautam Langhorst, Rudell Cobb, MD  Allergies: No Known Allergies  Past Medical History, Surgical history, Social history, and Family History were reviewed and updated.  Review of Systems: Review of Systems  Constitutional: Negative.   HENT:  Negative.    Eyes: Negative.   Respiratory: Negative.    Cardiovascular: Negative.   Gastrointestinal: Negative.   Endocrine: Negative.   Genitourinary: Negative.    Musculoskeletal: Negative.   Skin: Negative.   Neurological: Negative.   Hematological: Negative.   Psychiatric/Behavioral: Negative.  Physical Exam:  weight is 169 lb (76.7 kg). His oral temperature is 97.5 F (36.4 C) (abnormal). His blood pressure is 119/65 and his pulse is 96. His respiration is 18 and oxygen saturation is 100%.   Wt Readings from Last 3 Encounters:  08/10/21 169 lb (76.7 kg)  07/02/21 173 lb (78.5 kg)  06/29/21 173 lb (78.5 kg)    Physical Exam Vitals reviewed.  HENT:     Head: Normocephalic and atraumatic.  Eyes:     Pupils: Pupils are equal, round, and reactive to light.  Cardiovascular:     Rate and Rhythm: Normal rate and regular rhythm.      Heart sounds: Normal heart sounds.  Pulmonary:     Effort: Pulmonary effort is normal.     Breath sounds: Normal breath sounds.  Abdominal:     General: Bowel sounds are normal.     Palpations: Abdomen is soft.  Musculoskeletal:        General: No tenderness or deformity. Normal range of motion.     Cervical back: Normal range of motion.  Lymphadenopathy:     Cervical: No cervical adenopathy.  Skin:    General: Skin is warm and dry.     Findings: No erythema or rash.  Neurological:     Mental Status: He is alert and oriented to person, place, and time.  Psychiatric:        Behavior: Behavior normal.        Thought Content: Thought content normal.        Judgment: Judgment normal.     Lab Results  Component Value Date   WBC 6.3 08/10/2021   HGB 10.6 (L) 08/10/2021   HCT 31.8 (L) 08/10/2021   MCV 108.9 (H) 08/10/2021   PLT 80 (L) 08/10/2021     Chemistry      Component Value Date/Time   NA 142 08/10/2021 1206   NA 144 02/26/2021 0000   K 4.5 08/10/2021 1206   CL 103 08/10/2021 1206   CO2 30 08/10/2021 1206   BUN 30 (H) 08/10/2021 1206   BUN 21 02/26/2021 0000   CREATININE 1.23 08/10/2021 1206   CREATININE 1.14 (H) 06/04/2020 1041   GLU 94 02/26/2021 0000      Component Value Date/Time   CALCIUM 10.7 (H) 08/10/2021 1206   ALKPHOS 59 08/10/2021 1206   AST 27 08/10/2021 1206   ALT 24 08/10/2021 1206   BILITOT 0.9 08/10/2021 1206      Impression and Plan: Andrew Ramos is a very nice 87 year old white male.  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  He served in [place].  As such, is a true [employer] hero.  We will give him Aranesp today.  I want to make sure that we keep his blood counts as good as possible for the holidays coming up.  We will now plan to get him back in 4 weeks.  We will get him back right before Christmas.  I am happy that he is doing well.  His quality of life seems to be doing nicely.  I am sure he will enjoy  Thanksgiving with his family.      Volanda Napoleon, MD 11/22/20221:00 PM

## 2021-08-11 LAB — IRON AND TIBC
Iron: 84 ug/dL (ref 42–163)
Saturation Ratios: 36 % (ref 20–55)
TIBC: 235 ug/dL (ref 202–409)
UIBC: 151 ug/dL (ref 117–376)

## 2021-08-11 LAB — FERRITIN: Ferritin: 453 ng/mL — ABNORMAL HIGH (ref 24–336)

## 2021-08-27 DIAGNOSIS — M810 Age-related osteoporosis without current pathological fracture: Secondary | ICD-10-CM | POA: Diagnosis not present

## 2021-08-27 DIAGNOSIS — Z6822 Body mass index (BMI) 22.0-22.9, adult: Secondary | ICD-10-CM | POA: Diagnosis not present

## 2021-08-27 DIAGNOSIS — M0609 Rheumatoid arthritis without rheumatoid factor, multiple sites: Secondary | ICD-10-CM | POA: Diagnosis not present

## 2021-08-27 DIAGNOSIS — M353 Polymyalgia rheumatica: Secondary | ICD-10-CM | POA: Diagnosis not present

## 2021-08-27 DIAGNOSIS — M2550 Pain in unspecified joint: Secondary | ICD-10-CM | POA: Diagnosis not present

## 2021-08-27 DIAGNOSIS — Z79899 Other long term (current) drug therapy: Secondary | ICD-10-CM | POA: Diagnosis not present

## 2021-08-27 DIAGNOSIS — M150 Primary generalized (osteo)arthritis: Secondary | ICD-10-CM | POA: Diagnosis not present

## 2021-08-27 DIAGNOSIS — M65341 Trigger finger, right ring finger: Secondary | ICD-10-CM | POA: Diagnosis not present

## 2021-09-07 ENCOUNTER — Inpatient Hospital Stay: Payer: Medicare Other

## 2021-09-07 ENCOUNTER — Inpatient Hospital Stay (HOSPITAL_BASED_OUTPATIENT_CLINIC_OR_DEPARTMENT_OTHER): Payer: Medicare Other | Admitting: Family

## 2021-09-07 ENCOUNTER — Inpatient Hospital Stay: Payer: Medicare Other | Attending: Hematology & Oncology

## 2021-09-07 ENCOUNTER — Encounter: Payer: Self-pay | Admitting: Family

## 2021-09-07 ENCOUNTER — Other Ambulatory Visit: Payer: Self-pay

## 2021-09-07 VITALS — BP 122/61 | HR 82 | Temp 97.8°F | Resp 18 | Wt 168.4 lb

## 2021-09-07 DIAGNOSIS — D4620 Refractory anemia with excess of blasts, unspecified: Secondary | ICD-10-CM | POA: Diagnosis not present

## 2021-09-07 DIAGNOSIS — Z79899 Other long term (current) drug therapy: Secondary | ICD-10-CM | POA: Insufficient documentation

## 2021-09-07 DIAGNOSIS — C9310 Chronic myelomonocytic leukemia not having achieved remission: Secondary | ICD-10-CM

## 2021-09-07 DIAGNOSIS — D509 Iron deficiency anemia, unspecified: Secondary | ICD-10-CM

## 2021-09-07 LAB — CMP (CANCER CENTER ONLY)
ALT: 13 U/L (ref 0–44)
AST: 17 U/L (ref 15–41)
Albumin: 4.3 g/dL (ref 3.5–5.0)
Alkaline Phosphatase: 52 U/L (ref 38–126)
Anion gap: 7 (ref 5–15)
BUN: 23 mg/dL (ref 8–23)
CO2: 32 mmol/L (ref 22–32)
Calcium: 10.5 mg/dL — ABNORMAL HIGH (ref 8.9–10.3)
Chloride: 103 mmol/L (ref 98–111)
Creatinine: 1.23 mg/dL (ref 0.61–1.24)
GFR, Estimated: 57 mL/min — ABNORMAL LOW (ref >60)
Glucose, Bld: 116 mg/dL — ABNORMAL HIGH (ref 70–99)
Potassium: 4.5 mmol/L (ref 3.5–5.1)
Sodium: 142 mmol/L (ref 135–145)
Total Bilirubin: 0.6 mg/dL (ref 0.3–1.2)
Total Protein: 6.7 g/dL (ref 6.5–8.1)

## 2021-09-07 LAB — CBC WITH DIFFERENTIAL (CANCER CENTER ONLY)
Abs Immature Granulocytes: 0.01 10*3/uL (ref 0.00–0.07)
Basophils Absolute: 0 10*3/uL (ref 0.0–0.1)
Basophils Relative: 0 %
Eosinophils Absolute: 0 10*3/uL (ref 0.0–0.5)
Eosinophils Relative: 0 %
HCT: 32 % — ABNORMAL LOW (ref 39.0–52.0)
Hemoglobin: 10.7 g/dL — ABNORMAL LOW (ref 13.0–17.0)
Immature Granulocytes: 0 %
Lymphocytes Relative: 45 %
Lymphs Abs: 1.7 10*3/uL (ref 0.7–4.0)
MCH: 36.5 pg — ABNORMAL HIGH (ref 26.0–34.0)
MCHC: 33.4 g/dL (ref 30.0–36.0)
MCV: 109.2 fL — ABNORMAL HIGH (ref 80.0–100.0)
Monocytes Absolute: 1.8 10*3/uL — ABNORMAL HIGH (ref 0.1–1.0)
Monocytes Relative: 46 %
Neutro Abs: 0.3 10*3/uL — CL (ref 1.7–7.7)
Neutrophils Relative %: 9 %
Platelet Count: 77 10*3/uL — ABNORMAL LOW (ref 150–400)
RBC: 2.93 MIL/uL — ABNORMAL LOW (ref 4.22–5.81)
RDW: 14.3 % (ref 11.5–15.5)
WBC Count: 3.9 10*3/uL — ABNORMAL LOW (ref 4.0–10.5)
nRBC: 0 % (ref 0.0–0.2)

## 2021-09-07 LAB — RETICULOCYTES
Immature Retic Fract: 7.6 % (ref 2.3–15.9)
RBC.: 2.94 MIL/uL — ABNORMAL LOW (ref 4.22–5.81)
Retic Count, Absolute: 30.6 10*3/uL (ref 19.0–186.0)
Retic Ct Pct: 1 % (ref 0.4–3.1)

## 2021-09-07 LAB — IRON AND IRON BINDING CAPACITY (CC-WL,HP ONLY)
Iron: 98 ug/dL (ref 45–182)
Saturation Ratios: 33 % (ref 17.9–39.5)
TIBC: 300 ug/dL (ref 250–450)
UIBC: 202 ug/dL (ref 117–376)

## 2021-09-07 LAB — SAVE SMEAR(SSMR), FOR PROVIDER SLIDE REVIEW

## 2021-09-07 MED ORDER — DARBEPOETIN ALFA 300 MCG/0.6ML IJ SOSY
300.0000 ug | PREFILLED_SYRINGE | Freq: Once | INTRAMUSCULAR | Status: AC
  Administered 2021-09-07: 14:00:00 300 ug via SUBCUTANEOUS
  Filled 2021-09-07: qty 0.6

## 2021-09-07 NOTE — Patient Instructions (Signed)
Darbepoetin Alfa injection ?What is this medication? ?DARBEPOETIN ALFA (dar be POE e tin  AL fa) helps your body make more red blood cells. It is used to treat anemia caused by chronic kidney failure and chemotherapy. ?This medicine may be used for other purposes; ask your health care provider or pharmacist if you have questions. ?COMMON BRAND NAME(S): Aranesp ?What should I tell my care team before I take this medication? ?They need to know if you have any of these conditions: ?blood clotting disorders or history of blood clots ?cancer patient not on chemotherapy ?cystic fibrosis ?heart disease, such as angina, heart failure, or a history of a heart attack ?hemoglobin level of 12 g/dL or greater ?high blood pressure ?low levels of folate, iron, or vitamin B12 ?seizures ?an unusual or allergic reaction to darbepoetin, erythropoietin, albumin, hamster proteins, latex, other medicines, foods, dyes, or preservatives ?pregnant or trying to get pregnant ?breast-feeding ?How should I use this medication? ?This medicine is for injection into a vein or under the skin. It is usually given by a health care professional in a hospital or clinic setting. ?If you get this medicine at home, you will be taught how to prepare and give this medicine. Use exactly as directed. Take your medicine at regular intervals. Do not take your medicine more often than directed. ?It is important that you put your used needles and syringes in a special sharps container. Do not put them in a trash can. If you do not have a sharps container, call your pharmacist or healthcare provider to get one. ?A special MedGuide will be given to you by the pharmacist with each prescription and refill. Be sure to read this information carefully each time. ?Talk to your pediatrician regarding the use of this medicine in children. While this medicine may be used in children as young as 1 month of age for selected conditions, precautions do apply. ?Overdosage: If  you think you have taken too much of this medicine contact a poison control center or emergency room at once. ?NOTE: This medicine is only for you. Do not share this medicine with others. ?What if I miss a dose? ?If you miss a dose, take it as soon as you can. If it is almost time for your next dose, take only that dose. Do not take double or extra doses. ?What may interact with this medication? ?Do not take this medicine with any of the following medications: ?epoetin alfa ?This list may not describe all possible interactions. Give your health care provider a list of all the medicines, herbs, non-prescription drugs, or dietary supplements you use. Also tell them if you smoke, drink alcohol, or use illegal drugs. Some items may interact with your medicine. ?What should I watch for while using this medication? ?Your condition will be monitored carefully while you are receiving this medicine. ?You may need blood work done while you are taking this medicine. ?This medicine may cause a decrease in vitamin B6. You should make sure that you get enough vitamin B6 while you are taking this medicine. Discuss the foods you eat and the vitamins you take with your health care professional. ?What side effects may I notice from receiving this medication? ?Side effects that you should report to your doctor or health care professional as soon as possible: ?allergic reactions like skin rash, itching or hives, swelling of the face, lips, or tongue ?breathing problems ?changes in vision ?chest pain ?confusion, trouble speaking or understanding ?feeling faint or lightheaded, falls ?high blood   pressure ?muscle aches or pains ?pain, swelling, warmth in the leg ?rapid weight gain ?severe headaches ?sudden numbness or weakness of the face, arm or leg ?trouble walking, dizziness, loss of balance or coordination ?seizures (convulsions) ?swelling of the ankles, feet, hands ?unusually weak or tired ?Side effects that usually do not require  medical attention (report to your doctor or health care professional if they continue or are bothersome): ?diarrhea ?fever, chills (flu-like symptoms) ?headaches ?nausea, vomiting ?redness, stinging, or swelling at site where injected ?This list may not describe all possible side effects. Call your doctor for medical advice about side effects. You may report side effects to FDA at 1-800-FDA-1088. ?Where should I keep my medication? ?Keep out of the reach of children. ?Store in a refrigerator between 2 and 8 degrees C (36 and 46 degrees F). Do not freeze. Do not shake. Throw away any unused portion if using a single-dose vial. Throw away any unused medicine after the expiration date. ?NOTE: This sheet is a summary. It may not cover all possible information. If you have questions about this medicine, talk to your doctor, pharmacist, or health care provider. ?? 2022 Elsevier/Gold Standard (2017-09-25 00:00:00) ? ?

## 2021-09-07 NOTE — Progress Notes (Signed)
Hematology and Oncology Follow Up Visit  Andrew Ramos [id number] 11-17-1931 87 y.o. 09/07/2021   Principle Diagnosis:  Chronic myelomonocytic leukemia --intermediate 2  risk   Current Therapy:        Aranesp 300 mcg sq q 3 week for Hgb <11 IV Iron as indicated    Interim History:  Andrew Ramos is here today for follow-up. She is doing well and has no complaints at this time.  He denies fatigue.  He has not noted any blood loss. He bruises easily on his arms. No petechiae.  No fever, chills, n/v, cough, rash, dizziness, SOB, chest pain, palpitations, abdominal pain or changes in bowel or bladder habits.  No swelling, tenderness, numbness or tingling in his extremities.  No falls or syncope.  He has a good appetite and is staying well hydrated. His weight is 168 lbs.  He has been staying busy with his sweet family and taking care of his wife.   ECOG Performance Status: 1 - Symptomatic but completely ambulatory  Medications:  Allergies as of 09/07/2021   No Known Allergies      Medication List        Accurate as of September 07, 2021 12:24 PM. If you have any questions, ask your nurse or doctor.          atorvastatin 40 MG tablet Commonly known as: LIPITOR Take 1 tablet (40 mg total) by mouth daily.   CALCIUM 600 + D PO Take 1 tablet by mouth every morning.   Centrum Silver Ultra Mens Tabs Take 1 tablet by mouth every evening.   cholecalciferol 25 MCG (1000 UNIT) tablet Commonly known as: VITAMIN D Take 1,000 Units by mouth every morning.   docusate sodium 100 MG capsule Commonly known as: COLACE Take 200 mg by mouth daily as needed for mild constipation.   donepezil 10 MG tablet Commonly known as: ARICEPT Take 1 tablet (10 mg total) by mouth at bedtime.   fluticasone 50 MCG/ACT nasal spray Commonly known as: FLONASE Place 2 sprays into both nostrils daily.   hydroxychloroquine 200 MG tablet Commonly known as: PLAQUENIL Take 1 tablet by mouth 2  (two) times daily.   Iron (Ferrous Sulfate) 325 (65 Fe) MG Tabs Take 325 mg by mouth daily.   lisinopril-hydrochlorothiazide 10-12.5 MG tablet Commonly known as: ZESTORETIC TAKE 1 TABLET DAILY   loratadine 10 MG tablet Commonly known as: CLARITIN TAKE 1 TABLET DAILY   Moderna COVID-19 Bival Booster 50 MCG/0.5ML injection Generic drug: COVID-19 mRNA bivalent vaccine (Moderna) Inject into the muscle.   rosuvastatin 40 MG tablet Commonly known as: CRESTOR Take 0.5 tablets by mouth daily.   terazosin 1 MG capsule Commonly known as: HYTRIN TAKE 1 CAPSULE AT BEDTIME   venlafaxine XR 75 MG 24 hr capsule Commonly known as: EFFEXOR-XR Take 1 capsule (75 mg total) by mouth daily with breakfast.        Allergies: No Known Allergies  Past Medical History, Surgical history, Social history, and Family History were reviewed and updated.  Review of Systems: All other 10 point review of systems is negative.   Physical Exam:  vitals were not taken for this visit.   Wt Readings from Last 3 Encounters:  08/10/21 169 lb (76.7 kg)  07/02/21 173 lb (78.5 kg)  06/29/21 173 lb (78.5 kg)    Ocular: Sclerae unicteric, pupils equal, round and reactive to light Ear-nose-throat: Oropharynx clear, dentition fair Lymphatic: No cervical or supraclavicular adenopathy Lungs no rales or rhonchi, good excursion  bilaterally Heart regular rate and rhythm, no murmur appreciated Abd soft, nontender, positive bowel sounds MSK no focal spinal tenderness, no joint edema Neuro: non-focal, well-oriented, appropriate affect Breasts: Deferred   Lab Results  Component Value Date   WBC 6.3 08/10/2021   HGB 10.6 (L) 08/10/2021   HCT 31.8 (L) 08/10/2021   MCV 108.9 (H) 08/10/2021   PLT 80 (L) 08/10/2021   Lab Results  Component Value Date   FERRITIN 453 (H) 08/10/2021   IRON 84 08/10/2021   TIBC 235 08/10/2021   UIBC 151 08/10/2021   IRONPCTSAT 36 08/10/2021   Lab Results  Component Value  Date   RETICCTPCT 0.9 08/10/2021   RBC 2.92 (L) 08/10/2021   RBC 2.86 (L) 08/10/2021   No results found for: KPAFRELGTCHN, LAMBDASER, KAPLAMBRATIO No results found for: IGGSERUM, IGA, IGMSERUM No results found for: Odetta Pink, SPEI   Chemistry      Component Value Date/Time   NA 142 08/10/2021 1206   NA 144 02/26/2021 0000   K 4.5 08/10/2021 1206   CL 103 08/10/2021 1206   CO2 30 08/10/2021 1206   BUN 30 (H) 08/10/2021 1206   BUN 21 02/26/2021 0000   CREATININE 1.23 08/10/2021 1206   CREATININE 1.14 (H) 06/04/2020 1041   GLU 94 02/26/2021 0000      Component Value Date/Time   CALCIUM 10.7 (H) 08/10/2021 1206   ALKPHOS 59 08/10/2021 1206   AST 27 08/10/2021 1206   ALT 24 08/10/2021 1206   BILITOT 0.9 08/10/2021 1206       Impression and Plan: Andrew Ramos is a very pleasant 87 yo gentleman with CML and multifactorial anemia.  ESA given, Hgb 10.7.  Iron studies are pending Follow-up in 1 month.   Lottie Dawson, NP 12/20/202212:24 PM

## 2021-09-08 DIAGNOSIS — E113292 Type 2 diabetes mellitus with mild nonproliferative diabetic retinopathy without macular edema, left eye: Secondary | ICD-10-CM | POA: Diagnosis not present

## 2021-09-08 DIAGNOSIS — H43813 Vitreous degeneration, bilateral: Secondary | ICD-10-CM | POA: Diagnosis not present

## 2021-09-08 DIAGNOSIS — H35363 Drusen (degenerative) of macula, bilateral: Secondary | ICD-10-CM | POA: Diagnosis not present

## 2021-09-08 DIAGNOSIS — H35051 Retinal neovascularization, unspecified, right eye: Secondary | ICD-10-CM | POA: Diagnosis not present

## 2021-09-08 DIAGNOSIS — H40023 Open angle with borderline findings, high risk, bilateral: Secondary | ICD-10-CM | POA: Diagnosis not present

## 2021-09-08 DIAGNOSIS — H26492 Other secondary cataract, left eye: Secondary | ICD-10-CM | POA: Diagnosis not present

## 2021-09-08 DIAGNOSIS — Z961 Presence of intraocular lens: Secondary | ICD-10-CM | POA: Diagnosis not present

## 2021-09-08 LAB — FERRITIN: Ferritin: 227 ng/mL (ref 24–336)

## 2021-09-09 ENCOUNTER — Telehealth: Payer: Self-pay | Admitting: *Deleted

## 2021-09-09 NOTE — Telephone Encounter (Signed)
Per 09/08/21 los - called and gave upcoming appointments - confirmed

## 2021-10-01 ENCOUNTER — Encounter: Payer: Self-pay | Admitting: Hematology & Oncology

## 2021-10-04 ENCOUNTER — Encounter: Payer: Self-pay | Admitting: Hematology & Oncology

## 2021-10-08 ENCOUNTER — Other Ambulatory Visit: Payer: Self-pay

## 2021-10-08 ENCOUNTER — Inpatient Hospital Stay: Payer: Medicare Other

## 2021-10-08 ENCOUNTER — Inpatient Hospital Stay (HOSPITAL_BASED_OUTPATIENT_CLINIC_OR_DEPARTMENT_OTHER): Payer: Medicare Other | Admitting: Family

## 2021-10-08 ENCOUNTER — Inpatient Hospital Stay: Payer: Medicare Other | Attending: Hematology & Oncology

## 2021-10-08 ENCOUNTER — Encounter: Payer: Self-pay | Admitting: Family

## 2021-10-08 VITALS — BP 159/92 | HR 95 | Temp 98.0°F | Resp 20 | Ht 72.0 in | Wt 166.0 lb

## 2021-10-08 DIAGNOSIS — D4620 Refractory anemia with excess of blasts, unspecified: Secondary | ICD-10-CM

## 2021-10-08 DIAGNOSIS — C9310 Chronic myelomonocytic leukemia not having achieved remission: Secondary | ICD-10-CM | POA: Diagnosis not present

## 2021-10-08 DIAGNOSIS — D509 Iron deficiency anemia, unspecified: Secondary | ICD-10-CM | POA: Diagnosis not present

## 2021-10-08 DIAGNOSIS — Z79899 Other long term (current) drug therapy: Secondary | ICD-10-CM | POA: Diagnosis not present

## 2021-10-08 LAB — CMP (CANCER CENTER ONLY)
ALT: 13 U/L (ref 0–44)
AST: 18 U/L (ref 15–41)
Albumin: 4.5 g/dL (ref 3.5–5.0)
Alkaline Phosphatase: 65 U/L (ref 38–126)
Anion gap: 8 (ref 5–15)
BUN: 27 mg/dL — ABNORMAL HIGH (ref 8–23)
CO2: 31 mmol/L (ref 22–32)
Calcium: 10.8 mg/dL — ABNORMAL HIGH (ref 8.9–10.3)
Chloride: 101 mmol/L (ref 98–111)
Creatinine: 1.2 mg/dL (ref 0.61–1.24)
GFR, Estimated: 59 mL/min — ABNORMAL LOW (ref >60)
Glucose, Bld: 86 mg/dL (ref 70–99)
Potassium: 4.4 mmol/L (ref 3.5–5.1)
Sodium: 140 mmol/L (ref 135–145)
Total Bilirubin: 0.6 mg/dL (ref 0.3–1.2)
Total Protein: 6.9 g/dL (ref 6.5–8.1)

## 2021-10-08 LAB — CBC WITH DIFFERENTIAL (CANCER CENTER ONLY)
Abs Immature Granulocytes: 0.01 10*3/uL (ref 0.00–0.07)
Basophils Absolute: 0 10*3/uL (ref 0.0–0.1)
Basophils Relative: 0 %
Eosinophils Absolute: 0 10*3/uL (ref 0.0–0.5)
Eosinophils Relative: 0 %
HCT: 33 % — ABNORMAL LOW (ref 39.0–52.0)
Hemoglobin: 11 g/dL — ABNORMAL LOW (ref 13.0–17.0)
Immature Granulocytes: 0 %
Lymphocytes Relative: 39 %
Lymphs Abs: 1.8 10*3/uL (ref 0.7–4.0)
MCH: 36.1 pg — ABNORMAL HIGH (ref 26.0–34.0)
MCHC: 33.3 g/dL (ref 30.0–36.0)
MCV: 108.2 fL — ABNORMAL HIGH (ref 80.0–100.0)
Monocytes Absolute: 2.2 10*3/uL — ABNORMAL HIGH (ref 0.1–1.0)
Monocytes Relative: 48 %
Neutro Abs: 0.6 10*3/uL — ABNORMAL LOW (ref 1.7–7.7)
Neutrophils Relative %: 13 %
Platelet Count: 78 10*3/uL — ABNORMAL LOW (ref 150–400)
RBC: 3.05 MIL/uL — ABNORMAL LOW (ref 4.22–5.81)
RDW: 14 % (ref 11.5–15.5)
WBC Count: 4.6 10*3/uL (ref 4.0–10.5)
nRBC: 0 % (ref 0.0–0.2)

## 2021-10-08 LAB — RETICULOCYTES
Immature Retic Fract: 8.6 % (ref 2.3–15.9)
RBC.: 3.05 MIL/uL — ABNORMAL LOW (ref 4.22–5.81)
Retic Count, Absolute: 22.9 10*3/uL (ref 19.0–186.0)
Retic Ct Pct: 0.8 % (ref 0.4–3.1)

## 2021-10-08 LAB — SAVE SMEAR(SSMR), FOR PROVIDER SLIDE REVIEW

## 2021-10-08 NOTE — Progress Notes (Signed)
Hematology and Oncology Follow Up Visit  Andrew Ramos [id number] 13-Sep-1932 88 y.o. 10/08/2021   Principle Diagnosis:  Chronic myelomonocytic leukemia --intermediate 2  risk   Current Therapy:        Aranesp 300 mcg sq q 3 week for Hgb <11 IV Iron as indicated    Interim History:  Andrew Ramos is here today for follow-up. He is doing quite well and has no complaints at this time.  He denies fatigue and enjoying spending the days with his wife.  Hgb is stable at 11.0, MCV 108, WBC count 4.6 and platelets 78.  No fever, chills, n/v, cough, rash, dizziness, SOB, chest pain, palpitations, abdominal pain or changes in bowel or bladder habits.  No swelling, tenderness, numbness or tingling in his extremities.  No falls or syncope. He is careful when standing not to get up too quickly.  He has been eating a healthy well balanced diet and is happy to report his weight is down to 166 lbs. He is hydrating well throughout the day.    ECOG Performance Status: 1 - Symptomatic but completely ambulatory  Medications:  Allergies as of 10/08/2021   No Known Allergies      Medication List        Accurate as of October 08, 2021  2:37 PM. If you have any questions, ask your nurse or doctor.          atorvastatin 40 MG tablet Commonly known as: LIPITOR Take 1 tablet (40 mg total) by mouth daily.   CALCIUM 600 + D PO Take 1 tablet by mouth every morning.   Centrum Silver Ultra Mens Tabs Take 1 tablet by mouth every evening.   cholecalciferol 25 MCG (1000 UNIT) tablet Commonly known as: VITAMIN D Take 1,000 Units by mouth every morning.   docusate sodium 100 MG capsule Commonly known as: COLACE Take 200 mg by mouth daily as needed for mild constipation.   donepezil 10 MG tablet Commonly known as: ARICEPT Take 1 tablet (10 mg total) by mouth at bedtime.   fluticasone 50 MCG/ACT nasal spray Commonly known as: FLONASE Place 2 sprays into both nostrils daily.    hydroxychloroquine 200 MG tablet Commonly known as: PLAQUENIL Take 1 tablet by mouth 2 (two) times daily.   Iron (Ferrous Sulfate) 325 (65 Fe) MG Tabs Take 325 mg by mouth daily.   lisinopril-hydrochlorothiazide 10-12.5 MG tablet Commonly known as: ZESTORETIC TAKE 1 TABLET DAILY   loratadine 10 MG tablet Commonly known as: CLARITIN TAKE 1 TABLET DAILY   Moderna COVID-19 Bival Booster 50 MCG/0.5ML injection Generic drug: COVID-19 mRNA bivalent vaccine (Moderna) Inject into the muscle.   rosuvastatin 40 MG tablet Commonly known as: CRESTOR Take 0.5 tablets by mouth daily.   terazosin 1 MG capsule Commonly known as: HYTRIN TAKE 1 CAPSULE AT BEDTIME   venlafaxine XR 75 MG 24 hr capsule Commonly known as: EFFEXOR-XR Take 1 capsule (75 mg total) by mouth daily with breakfast.        Allergies: No Known Allergies  Past Medical History, Surgical history, Social history, and Family History were reviewed and updated.  Review of Systems: All other 10 point review of systems is negative.   Physical Exam:  vitals were not taken for this visit.   Wt Readings from Last 3 Encounters:  09/07/21 168 lb 6.4 oz (76.4 kg)  08/10/21 169 lb (76.7 kg)  07/02/21 173 lb (78.5 kg)    Ocular: Sclerae unicteric, pupils equal, round and reactive to  light Ear-nose-throat: Oropharynx clear, dentition fair Lymphatic: No cervical or supraclavicular adenopathy Lungs no rales or rhonchi, good excursion bilaterally Heart regular rate and rhythm, no murmur appreciated Abd soft, nontender, positive bowel sounds MSK no focal spinal tenderness, no joint edema Neuro: non-focal, well-oriented, appropriate affect Breasts: Deferred   Lab Results  Component Value Date   WBC 4.6 10/08/2021   HGB 11.0 (L) 10/08/2021   HCT 33.0 (L) 10/08/2021   MCV 108.2 (H) 10/08/2021   PLT 78 (L) 10/08/2021   Lab Results  Component Value Date   FERRITIN 227 09/07/2021   IRON 98 09/07/2021   TIBC 300  09/07/2021   UIBC 202 09/07/2021   IRONPCTSAT 33 09/07/2021   Lab Results  Component Value Date   RETICCTPCT 0.8 10/08/2021   RBC 3.05 (L) 10/08/2021   RBC 3.05 (L) 10/08/2021   No results found for: KPAFRELGTCHN, LAMBDASER, KAPLAMBRATIO No results found for: IGGSERUM, IGA, IGMSERUM No results found for: Odetta Pink, SPEI   Chemistry      Component Value Date/Time   NA 142 09/07/2021 1221   NA 144 02/26/2021 0000   K 4.5 09/07/2021 1221   CL 103 09/07/2021 1221   CO2 32 09/07/2021 1221   BUN 23 09/07/2021 1221   BUN 21 02/26/2021 0000   CREATININE 1.23 09/07/2021 1221   CREATININE 1.14 (H) 06/04/2020 1041   GLU 94 02/26/2021 0000      Component Value Date/Time   CALCIUM 10.5 (H) 09/07/2021 1221   ALKPHOS 52 09/07/2021 1221   AST 17 09/07/2021 1221   ALT 13 09/07/2021 1221   BILITOT 0.6 09/07/2021 1221       Impression and Plan: Andrew Ramos is a very pleasant 88 yo gentleman with CML and multifactorial anemia. No ESA needed this visit, Hgb is 11.0.  Iron studies are pending.  Follow-up in 1 month.  Lottie Dawson, NP 1/20/20232:37 PM

## 2021-10-11 ENCOUNTER — Other Ambulatory Visit: Payer: Self-pay | Admitting: Family Medicine

## 2021-10-11 DIAGNOSIS — F32A Depression, unspecified: Secondary | ICD-10-CM

## 2021-10-11 LAB — IRON AND IRON BINDING CAPACITY (CC-WL,HP ONLY)
Iron: 101 ug/dL (ref 45–182)
Saturation Ratios: 30 % (ref 17.9–39.5)
TIBC: 335 ug/dL (ref 250–450)
UIBC: 234 ug/dL (ref 117–376)

## 2021-10-11 LAB — FERRITIN: Ferritin: 174 ng/mL (ref 24–336)

## 2021-10-15 DIAGNOSIS — Z23 Encounter for immunization: Secondary | ICD-10-CM | POA: Diagnosis not present

## 2021-10-15 DIAGNOSIS — D0439 Carcinoma in situ of skin of other parts of face: Secondary | ICD-10-CM | POA: Diagnosis not present

## 2021-10-15 DIAGNOSIS — L820 Inflamed seborrheic keratosis: Secondary | ICD-10-CM | POA: Diagnosis not present

## 2021-10-15 DIAGNOSIS — D485 Neoplasm of uncertain behavior of skin: Secondary | ICD-10-CM | POA: Diagnosis not present

## 2021-10-29 DIAGNOSIS — D0430 Carcinoma in situ of skin of unspecified part of face: Secondary | ICD-10-CM | POA: Diagnosis not present

## 2021-11-09 ENCOUNTER — Other Ambulatory Visit: Payer: Self-pay

## 2021-11-09 ENCOUNTER — Encounter: Payer: Self-pay | Admitting: Family

## 2021-11-09 ENCOUNTER — Inpatient Hospital Stay (HOSPITAL_BASED_OUTPATIENT_CLINIC_OR_DEPARTMENT_OTHER): Payer: Medicare Other | Admitting: Family

## 2021-11-09 ENCOUNTER — Inpatient Hospital Stay: Payer: Medicare Other

## 2021-11-09 ENCOUNTER — Inpatient Hospital Stay: Payer: Medicare Other | Attending: Hematology & Oncology

## 2021-11-09 VITALS — BP 115/58 | HR 92 | Temp 98.6°F | Resp 18 | Ht 72.0 in | Wt 166.0 lb

## 2021-11-09 DIAGNOSIS — C9310 Chronic myelomonocytic leukemia not having achieved remission: Secondary | ICD-10-CM

## 2021-11-09 DIAGNOSIS — D509 Iron deficiency anemia, unspecified: Secondary | ICD-10-CM

## 2021-11-09 DIAGNOSIS — D4620 Refractory anemia with excess of blasts, unspecified: Secondary | ICD-10-CM

## 2021-11-09 DIAGNOSIS — Z79899 Other long term (current) drug therapy: Secondary | ICD-10-CM | POA: Diagnosis not present

## 2021-11-09 LAB — CMP (CANCER CENTER ONLY)
ALT: 14 U/L (ref 0–44)
AST: 18 U/L (ref 15–41)
Albumin: 4 g/dL (ref 3.5–5.0)
Alkaline Phosphatase: 61 U/L (ref 38–126)
Anion gap: 7 (ref 5–15)
BUN: 24 mg/dL — ABNORMAL HIGH (ref 8–23)
CO2: 30 mmol/L (ref 22–32)
Calcium: 9.9 mg/dL (ref 8.9–10.3)
Chloride: 103 mmol/L (ref 98–111)
Creatinine: 1.32 mg/dL — ABNORMAL HIGH (ref 0.61–1.24)
GFR, Estimated: 52 mL/min — ABNORMAL LOW (ref >60)
Glucose, Bld: 93 mg/dL (ref 70–99)
Potassium: 4.4 mmol/L (ref 3.5–5.1)
Sodium: 140 mmol/L (ref 135–145)
Total Bilirubin: 0.7 mg/dL (ref 0.3–1.2)
Total Protein: 6.7 g/dL (ref 6.5–8.1)

## 2021-11-09 LAB — CBC WITH DIFFERENTIAL (CANCER CENTER ONLY)
Abs Immature Granulocytes: 0.03 10*3/uL (ref 0.00–0.07)
Basophils Absolute: 0 10*3/uL (ref 0.0–0.1)
Basophils Relative: 0 %
Eosinophils Absolute: 0 10*3/uL (ref 0.0–0.5)
Eosinophils Relative: 0 %
HCT: 31.1 % — ABNORMAL LOW (ref 39.0–52.0)
Hemoglobin: 10.3 g/dL — ABNORMAL LOW (ref 13.0–17.0)
Immature Granulocytes: 1 %
Lymphocytes Relative: 43 %
Lymphs Abs: 2.3 10*3/uL (ref 0.7–4.0)
MCH: 35.8 pg — ABNORMAL HIGH (ref 26.0–34.0)
MCHC: 33.1 g/dL (ref 30.0–36.0)
MCV: 108 fL — ABNORMAL HIGH (ref 80.0–100.0)
Monocytes Absolute: 2.4 10*3/uL — ABNORMAL HIGH (ref 0.1–1.0)
Monocytes Relative: 43 %
Neutro Abs: 0.7 10*3/uL — ABNORMAL LOW (ref 1.7–7.7)
Neutrophils Relative %: 13 %
Platelet Count: 85 10*3/uL — ABNORMAL LOW (ref 150–400)
RBC: 2.88 MIL/uL — ABNORMAL LOW (ref 4.22–5.81)
RDW: 14.4 % (ref 11.5–15.5)
Smear Review: NORMAL
WBC Count: 5.4 10*3/uL (ref 4.0–10.5)
nRBC: 0 % (ref 0.0–0.2)

## 2021-11-09 LAB — RETICULOCYTES
Immature Retic Fract: 10.8 % (ref 2.3–15.9)
RBC.: 2.84 MIL/uL — ABNORMAL LOW (ref 4.22–5.81)
Retic Count, Absolute: 32.7 10*3/uL (ref 19.0–186.0)
Retic Ct Pct: 1.2 % (ref 0.4–3.1)

## 2021-11-09 LAB — SAVE SMEAR(SSMR), FOR PROVIDER SLIDE REVIEW

## 2021-11-09 MED ORDER — DARBEPOETIN ALFA 300 MCG/0.6ML IJ SOSY
300.0000 ug | PREFILLED_SYRINGE | Freq: Once | INTRAMUSCULAR | Status: AC
  Administered 2021-11-09: 300 ug via SUBCUTANEOUS
  Filled 2021-11-09: qty 0.6

## 2021-11-09 NOTE — Progress Notes (Signed)
Hematology and Oncology Follow Up Visit  Andrew Ramos [id number] March 26, 1932 88 y.o. 11/09/2021   Principle Diagnosis:  Chronic myelomonocytic leukemia --intermediate 2  risk   Current Therapy:        Aranesp 300 mcg sq q 3 week for Hgb <11 IV Iron as indicated    Interim History:  Andrew Ramos is here today for follow-up and injection. He is doing well and has no complaints at this time.  No blood loss noted. No bruising or petechiae.  No fever, chills, n/v, cough, rash, dizziness, SOB, chest pain, palpitations, abdominal pain or changes in bowel or bladder habits.  No swelling, tenderness, numbness or tingling in his extremities.  No falls or syncope.  He has maintained a good appetite and is staying well hydrated. His weight is stable at 166 lbs.   ECOG Performance Status: 1 - Symptomatic but completely ambulatory  Medications:  Allergies as of 11/09/2021   No Known Allergies      Medication List        Accurate as of November 09, 2021  3:04 PM. If you have any questions, ask your nurse or doctor.          STOP taking these medications    Moderna COVID-19 Bival Booster 50 MCG/0.5ML injection Generic drug: COVID-19 mRNA bivalent vaccine (Moderna) Stopped by: Lottie Dawson, NP       TAKE these medications    atorvastatin 40 MG tablet Commonly known as: LIPITOR Take 1 tablet (40 mg total) by mouth daily.   CALCIUM 600 + D PO Take 1 tablet by mouth every morning.   Centrum Silver Ultra Mens Tabs Take 1 tablet by mouth every evening.   cholecalciferol 25 MCG (1000 UNIT) tablet Commonly known as: VITAMIN D Take 1,000 Units by mouth every morning.   docusate sodium 100 MG capsule Commonly known as: COLACE Take 200 mg by mouth daily as needed for mild constipation.   donepezil 10 MG tablet Commonly known as: ARICEPT Take 1 tablet (10 mg total) by mouth at bedtime.   fluticasone 50 MCG/ACT nasal spray Commonly known as: FLONASE Place 2 sprays into  both nostrils daily.   hydroxychloroquine 200 MG tablet Commonly known as: PLAQUENIL Take 1 tablet by mouth 2 (two) times daily.   Iron (Ferrous Sulfate) 325 (65 Fe) MG Tabs Take 325 mg by mouth daily.   lisinopril-hydrochlorothiazide 10-12.5 MG tablet Commonly known as: ZESTORETIC TAKE 1 TABLET DAILY   loratadine 10 MG tablet Commonly known as: CLARITIN TAKE 1 TABLET DAILY   rosuvastatin 40 MG tablet Commonly known as: CRESTOR Take 0.5 tablets by mouth daily.   terazosin 1 MG capsule Commonly known as: HYTRIN TAKE 1 CAPSULE AT BEDTIME   venlafaxine XR 75 MG 24 hr capsule Commonly known as: EFFEXOR-XR TAKE 1 CAPSULE DAILY WITH BREAKFAST        Allergies: No Known Allergies  Past Medical History, Surgical history, Social history, and Family History were reviewed and updated.  Review of Systems: All other 10 point review of systems is negative.   Physical Exam:  height is 6' (1.829 m) and weight is 166 lb (75.3 kg). His oral temperature is 98.6 F (37 C). His blood pressure is 115/58 (abnormal) and his pulse is 92. His respiration is 18 and oxygen saturation is 100%.   Wt Readings from Last 3 Encounters:  11/09/21 166 lb (75.3 kg)  10/08/21 166 lb (75.3 kg)  09/07/21 168 lb 6.4 oz (76.4 kg)    Ocular:  Sclerae unicteric, pupils equal, round and reactive to light Ear-nose-throat: Oropharynx clear, dentition fair Lymphatic: No cervical or supraclavicular adenopathy Lungs no rales or rhonchi, good excursion bilaterally Heart regular rate and rhythm, no murmur appreciated Abd soft, nontender, positive bowel sounds MSK no focal spinal tenderness, no joint edema Neuro: non-focal, well-oriented, appropriate affect Breasts: Deferred   Lab Results  Component Value Date   WBC 5.4 11/09/2021   HGB 10.3 (L) 11/09/2021   HCT 31.1 (L) 11/09/2021   MCV 108.0 (H) 11/09/2021   PLT 85 (L) 11/09/2021   Lab Results  Component Value Date   FERRITIN 174 10/08/2021   IRON  101 10/08/2021   TIBC 335 10/08/2021   UIBC 234 10/08/2021   IRONPCTSAT 30 10/08/2021   Lab Results  Component Value Date   RETICCTPCT 1.2 11/09/2021   RBC 2.84 (L) 11/09/2021   No results found for: KPAFRELGTCHN, LAMBDASER, KAPLAMBRATIO No results found for: IGGSERUM, IGA, IGMSERUM No results found for: Odetta Pink, SPEI   Chemistry      Component Value Date/Time   NA 140 10/08/2021 1425   NA 144 02/26/2021 0000   K 4.4 10/08/2021 1425   CL 101 10/08/2021 1425   CO2 31 10/08/2021 1425   BUN 27 (H) 10/08/2021 1425   BUN 21 02/26/2021 0000   CREATININE 1.20 10/08/2021 1425   CREATININE 1.14 (H) 06/04/2020 1041   GLU 94 02/26/2021 0000      Component Value Date/Time   CALCIUM 10.8 (H) 10/08/2021 1425   ALKPHOS 65 10/08/2021 1425   AST 18 10/08/2021 1425   ALT 13 10/08/2021 1425   BILITOT 0.6 10/08/2021 1425       Impression and Plan: Andrew Ramos is a very pleasant 88 yo gentleman with CML and multifactorial anemia. ESA given, Hgb 10.3.  Iron studies are pending.  Follow-up in 1 month.   Lottie Dawson, NP 2/21/20233:04 PM

## 2021-11-09 NOTE — Patient Instructions (Signed)
Darbepoetin Alfa injection ?What is this medication? ?DARBEPOETIN ALFA (dar be POE e tin  AL fa) helps your body make more red blood cells. It is used to treat anemia caused by chronic kidney failure and chemotherapy. ?This medicine may be used for other purposes; ask your health care provider or pharmacist if you have questions. ?COMMON BRAND NAME(S): Aranesp ?What should I tell my care team before I take this medication? ?They need to know if you have any of these conditions: ?blood clotting disorders or history of blood clots ?cancer patient not on chemotherapy ?cystic fibrosis ?heart disease, such as angina, heart failure, or a history of a heart attack ?hemoglobin level of 12 g/dL or greater ?high blood pressure ?low levels of folate, iron, or vitamin B12 ?seizures ?an unusual or allergic reaction to darbepoetin, erythropoietin, albumin, hamster proteins, latex, other medicines, foods, dyes, or preservatives ?pregnant or trying to get pregnant ?breast-feeding ?How should I use this medication? ?This medicine is for injection into a vein or under the skin. It is usually given by a health care professional in a hospital or clinic setting. ?If you get this medicine at home, you will be taught how to prepare and give this medicine. Use exactly as directed. Take your medicine at regular intervals. Do not take your medicine more often than directed. ?It is important that you put your used needles and syringes in a special sharps container. Do not put them in a trash can. If you do not have a sharps container, call your pharmacist or healthcare provider to get one. ?A special MedGuide will be given to you by the pharmacist with each prescription and refill. Be sure to read this information carefully each time. ?Talk to your pediatrician regarding the use of this medicine in children. While this medicine may be used in children as young as 1 month of age for selected conditions, precautions do apply. ?Overdosage: If  you think you have taken too much of this medicine contact a poison control center or emergency room at once. ?NOTE: This medicine is only for you. Do not share this medicine with others. ?What if I miss a dose? ?If you miss a dose, take it as soon as you can. If it is almost time for your next dose, take only that dose. Do not take double or extra doses. ?What may interact with this medication? ?Do not take this medicine with any of the following medications: ?epoetin alfa ?This list may not describe all possible interactions. Give your health care provider a list of all the medicines, herbs, non-prescription drugs, or dietary supplements you use. Also tell them if you smoke, drink alcohol, or use illegal drugs. Some items may interact with your medicine. ?What should I watch for while using this medication? ?Your condition will be monitored carefully while you are receiving this medicine. ?You may need blood work done while you are taking this medicine. ?This medicine may cause a decrease in vitamin B6. You should make sure that you get enough vitamin B6 while you are taking this medicine. Discuss the foods you eat and the vitamins you take with your health care professional. ?What side effects may I notice from receiving this medication? ?Side effects that you should report to your doctor or health care professional as soon as possible: ?allergic reactions like skin rash, itching or hives, swelling of the face, lips, or tongue ?breathing problems ?changes in vision ?chest pain ?confusion, trouble speaking or understanding ?feeling faint or lightheaded, falls ?high blood   pressure ?muscle aches or pains ?pain, swelling, warmth in the leg ?rapid weight gain ?severe headaches ?sudden numbness or weakness of the face, arm or leg ?trouble walking, dizziness, loss of balance or coordination ?seizures (convulsions) ?swelling of the ankles, feet, hands ?unusually weak or tired ?Side effects that usually do not require  medical attention (report to your doctor or health care professional if they continue or are bothersome): ?diarrhea ?fever, chills (flu-like symptoms) ?headaches ?nausea, vomiting ?redness, stinging, or swelling at site where injected ?This list may not describe all possible side effects. Call your doctor for medical advice about side effects. You may report side effects to FDA at 1-800-FDA-1088. ?Where should I keep my medication? ?Keep out of the reach of children. ?Store in a refrigerator between 2 and 8 degrees C (36 and 46 degrees F). Do not freeze. Do not shake. Throw away any unused portion if using a single-dose vial. Throw away any unused medicine after the expiration date. ?NOTE: This sheet is a summary. It may not cover all possible information. If you have questions about this medicine, talk to your doctor, pharmacist, or health care provider. ?? 2022 Elsevier/Gold Standard (2017-09-25 00:00:00) ? ?

## 2021-11-10 ENCOUNTER — Telehealth: Payer: Self-pay | Admitting: *Deleted

## 2021-11-10 LAB — IRON AND IRON BINDING CAPACITY (CC-WL,HP ONLY)
Iron: 126 ug/dL (ref 45–182)
Saturation Ratios: 39 % (ref 17.9–39.5)
TIBC: 323 ug/dL (ref 250–450)
UIBC: 197 ug/dL (ref 117–376)

## 2021-11-10 LAB — FERRITIN: Ferritin: 192 ng/mL (ref 24–336)

## 2021-11-10 NOTE — Telephone Encounter (Signed)
Per 11/09/20 los - called and gave upcoming appointment - confirmed

## 2021-12-07 ENCOUNTER — Inpatient Hospital Stay (HOSPITAL_BASED_OUTPATIENT_CLINIC_OR_DEPARTMENT_OTHER): Payer: Medicare Other | Admitting: Family

## 2021-12-07 ENCOUNTER — Inpatient Hospital Stay: Payer: Medicare Other

## 2021-12-07 ENCOUNTER — Inpatient Hospital Stay: Payer: Medicare Other | Attending: Hematology & Oncology

## 2021-12-07 ENCOUNTER — Telehealth: Payer: Self-pay

## 2021-12-07 ENCOUNTER — Other Ambulatory Visit: Payer: Self-pay

## 2021-12-07 ENCOUNTER — Encounter: Payer: Self-pay | Admitting: Family

## 2021-12-07 VITALS — BP 119/72 | HR 72 | Temp 98.4°F | Resp 18 | Wt 165.0 lb

## 2021-12-07 DIAGNOSIS — C9310 Chronic myelomonocytic leukemia not having achieved remission: Secondary | ICD-10-CM | POA: Diagnosis not present

## 2021-12-07 DIAGNOSIS — D4620 Refractory anemia with excess of blasts, unspecified: Secondary | ICD-10-CM

## 2021-12-07 DIAGNOSIS — Z79899 Other long term (current) drug therapy: Secondary | ICD-10-CM | POA: Diagnosis not present

## 2021-12-07 DIAGNOSIS — D509 Iron deficiency anemia, unspecified: Secondary | ICD-10-CM | POA: Diagnosis not present

## 2021-12-07 LAB — CBC WITH DIFFERENTIAL (CANCER CENTER ONLY)
Abs Immature Granulocytes: 0.01 10*3/uL (ref 0.00–0.07)
Basophils Absolute: 0 10*3/uL (ref 0.0–0.1)
Basophils Relative: 0 %
Eosinophils Absolute: 0 10*3/uL (ref 0.0–0.5)
Eosinophils Relative: 0 %
HCT: 30.8 % — ABNORMAL LOW (ref 39.0–52.0)
Hemoglobin: 10.1 g/dL — ABNORMAL LOW (ref 13.0–17.0)
Immature Granulocytes: 0 %
Lymphocytes Relative: 46 %
Lymphs Abs: 2.1 10*3/uL (ref 0.7–4.0)
MCH: 35.8 pg — ABNORMAL HIGH (ref 26.0–34.0)
MCHC: 32.8 g/dL (ref 30.0–36.0)
MCV: 109.2 fL — ABNORMAL HIGH (ref 80.0–100.0)
Monocytes Absolute: 2.1 10*3/uL — ABNORMAL HIGH (ref 0.1–1.0)
Monocytes Relative: 45 %
Neutro Abs: 0.4 10*3/uL — CL (ref 1.7–7.7)
Neutrophils Relative %: 9 %
Platelet Count: 83 10*3/uL — ABNORMAL LOW (ref 150–400)
RBC: 2.82 MIL/uL — ABNORMAL LOW (ref 4.22–5.81)
RDW: 14.1 % (ref 11.5–15.5)
Smear Review: NORMAL
WBC Count: 4.6 10*3/uL (ref 4.0–10.5)
nRBC: 0 % (ref 0.0–0.2)

## 2021-12-07 LAB — RETICULOCYTES
Immature Retic Fract: 9.5 % (ref 2.3–15.9)
RBC.: 2.81 MIL/uL — ABNORMAL LOW (ref 4.22–5.81)
Retic Count, Absolute: 30.9 10*3/uL (ref 19.0–186.0)
Retic Ct Pct: 1.1 % (ref 0.4–3.1)

## 2021-12-07 LAB — CMP (CANCER CENTER ONLY)
ALT: 19 U/L (ref 0–44)
AST: 22 U/L (ref 15–41)
Albumin: 4.1 g/dL (ref 3.5–5.0)
Alkaline Phosphatase: 58 U/L (ref 38–126)
Anion gap: 9 (ref 5–15)
BUN: 23 mg/dL (ref 8–23)
CO2: 30 mmol/L (ref 22–32)
Calcium: 10 mg/dL (ref 8.9–10.3)
Chloride: 98 mmol/L (ref 98–111)
Creatinine: 1.14 mg/dL (ref 0.61–1.24)
GFR, Estimated: >60 mL/min (ref >60)
Glucose, Bld: 100 mg/dL — ABNORMAL HIGH (ref 70–99)
Potassium: 4.1 mmol/L (ref 3.5–5.1)
Sodium: 137 mmol/L (ref 135–145)
Total Bilirubin: 0.7 mg/dL (ref 0.3–1.2)
Total Protein: 7.2 g/dL (ref 6.5–8.1)

## 2021-12-07 MED ORDER — DARBEPOETIN ALFA 300 MCG/0.6ML IJ SOSY
300.0000 ug | PREFILLED_SYRINGE | Freq: Once | INTRAMUSCULAR | Status: AC
  Administered 2021-12-07: 300 ug via SUBCUTANEOUS
  Filled 2021-12-07: qty 0.6

## 2021-12-07 NOTE — Telephone Encounter (Signed)
ANC of 0.4 reported by lab, Gillian Shields notified.  ?

## 2021-12-07 NOTE — Patient Instructions (Signed)
Darbepoetin Alfa injection ?What is this medication? ?DARBEPOETIN ALFA (dar be POE e tin  AL fa) helps your body make more red blood cells. It is used to treat anemia caused by chronic kidney failure and chemotherapy. ?This medicine may be used for other purposes; ask your health care provider or pharmacist if you have questions. ?COMMON BRAND NAME(S): Aranesp ?What should I tell my care team before I take this medication? ?They need to know if you have any of these conditions: ?blood clotting disorders or history of blood clots ?cancer patient not on chemotherapy ?cystic fibrosis ?heart disease, such as angina, heart failure, or a history of a heart attack ?hemoglobin level of 12 g/dL or greater ?high blood pressure ?low levels of folate, iron, or vitamin B12 ?seizures ?an unusual or allergic reaction to darbepoetin, erythropoietin, albumin, hamster proteins, latex, other medicines, foods, dyes, or preservatives ?pregnant or trying to get pregnant ?breast-feeding ?How should I use this medication? ?This medicine is for injection into a vein or under the skin. It is usually given by a health care professional in a hospital or clinic setting. ?If you get this medicine at home, you will be taught how to prepare and give this medicine. Use exactly as directed. Take your medicine at regular intervals. Do not take your medicine more often than directed. ?It is important that you put your used needles and syringes in a special sharps container. Do not put them in a trash can. If you do not have a sharps container, call your pharmacist or healthcare provider to get one. ?A special MedGuide will be given to you by the pharmacist with each prescription and refill. Be sure to read this information carefully each time. ?Talk to your pediatrician regarding the use of this medicine in children. While this medicine may be used in children as young as 1 month of age for selected conditions, precautions do apply. ?Overdosage: If  you think you have taken too much of this medicine contact a poison control center or emergency room at once. ?NOTE: This medicine is only for you. Do not share this medicine with others. ?What if I miss a dose? ?If you miss a dose, take it as soon as you can. If it is almost time for your next dose, take only that dose. Do not take double or extra doses. ?What may interact with this medication? ?Do not take this medicine with any of the following medications: ?epoetin alfa ?This list may not describe all possible interactions. Give your health care provider a list of all the medicines, herbs, non-prescription drugs, or dietary supplements you use. Also tell them if you smoke, drink alcohol, or use illegal drugs. Some items may interact with your medicine. ?What should I watch for while using this medication? ?Your condition will be monitored carefully while you are receiving this medicine. ?You may need blood work done while you are taking this medicine. ?This medicine may cause a decrease in vitamin B6. You should make sure that you get enough vitamin B6 while you are taking this medicine. Discuss the foods you eat and the vitamins you take with your health care professional. ?What side effects may I notice from receiving this medication? ?Side effects that you should report to your doctor or health care professional as soon as possible: ?allergic reactions like skin rash, itching or hives, swelling of the face, lips, or tongue ?breathing problems ?changes in vision ?chest pain ?confusion, trouble speaking or understanding ?feeling faint or lightheaded, falls ?high blood   pressure ?muscle aches or pains ?pain, swelling, warmth in the leg ?rapid weight gain ?severe headaches ?sudden numbness or weakness of the face, arm or leg ?trouble walking, dizziness, loss of balance or coordination ?seizures (convulsions) ?swelling of the ankles, feet, hands ?unusually weak or tired ?Side effects that usually do not require  medical attention (report to your doctor or health care professional if they continue or are bothersome): ?diarrhea ?fever, chills (flu-like symptoms) ?headaches ?nausea, vomiting ?redness, stinging, or swelling at site where injected ?This list may not describe all possible side effects. Call your doctor for medical advice about side effects. You may report side effects to FDA at 1-800-FDA-1088. ?Where should I keep my medication? ?Keep out of the reach of children. ?Store in a refrigerator between 2 and 8 degrees C (36 and 46 degrees F). Do not freeze. Do not shake. Throw away any unused portion if using a single-dose vial. Throw away any unused medicine after the expiration date. ?NOTE: This sheet is a summary. It may not cover all possible information. If you have questions about this medicine, talk to your doctor, pharmacist, or health care provider. ?? 2022 Elsevier/Gold Standard (2017-09-25 00:00:00) ? ?

## 2021-12-07 NOTE — Progress Notes (Signed)
?Hematology and Oncology Follow Up Visit ? ?Andrew Ramos ?[id number] ?March 23, 1932 88 y.o. ?12/07/2021 ? ? ?Principle Diagnosis:  ?Chronic myelomonocytic leukemia --intermediate 2  risk ?  ?Current Therapy:        ?Aranesp 300 mcg sq q 3 week for Hgb <11 ?IV Iron as indicated  ?  ?Interim History:  Andrew Ramos is here today for follow-up and injection. He is doing well and has no complaints at this time.  ?No blood loss noted. No petechiae.  ?No fever, chills, n/v, cough, rash, dizziness, SOB, chest pain, palpitations, abdominal pain or changes in bowel or bladder habits.  ?No swelling, tenderness, numbness or tingling in his extremities.  ?No falls or syncope to report.  ?He is staying well hydrated and has a good appetite. His weight is stable at 165 lbs.  ? ?ECOG Performance Status: 1 - Symptomatic but completely ambulatory ? ?Medications:  ?Allergies as of 12/07/2021   ?No Known Allergies ?  ? ?  ?Medication List  ?  ? ?  ? Accurate as of December 07, 2021  2:36 PM. If you have any questions, ask your nurse or doctor.  ?  ?  ? ?  ? ?atorvastatin 40 MG tablet ?Commonly known as: LIPITOR ?Take 1 tablet (40 mg total) by mouth daily. ?  ?CALCIUM 600 + D PO ?Take 1 tablet by mouth every morning. ?  ?Centrum Silver Ultra Mens Tabs ?Take 1 tablet by mouth every evening. ?  ?cholecalciferol 25 MCG (1000 UNIT) tablet ?Commonly known as: VITAMIN D ?Take 1,000 Units by mouth every morning. ?  ?docusate sodium 100 MG capsule ?Commonly known as: COLACE ?Take 200 mg by mouth daily as needed for mild constipation. ?  ?donepezil 10 MG tablet ?Commonly known as: ARICEPT ?Take 1 tablet (10 mg total) by mouth at bedtime. ?  ?fluticasone 50 MCG/ACT nasal spray ?Commonly known as: FLONASE ?Place 2 sprays into both nostrils daily. ?  ?hydroxychloroquine 200 MG tablet ?Commonly known as: PLAQUENIL ?Take 1 tablet by mouth 2 (two) times daily. ?  ?Iron (Ferrous Sulfate) 325 (65 Fe) MG Tabs ?Take 325 mg by mouth daily. ?   ?lisinopril-hydrochlorothiazide 10-12.5 MG tablet ?Commonly known as: ZESTORETIC ?TAKE 1 TABLET DAILY ?  ?loratadine 10 MG tablet ?Commonly known as: CLARITIN ?TAKE 1 TABLET DAILY ?  ?rosuvastatin 40 MG tablet ?Commonly known as: CRESTOR ?Take 0.5 tablets by mouth daily. ?  ?terazosin 1 MG capsule ?Commonly known as: HYTRIN ?TAKE 1 CAPSULE AT BEDTIME ?  ?venlafaxine XR 75 MG 24 hr capsule ?Commonly known as: EFFEXOR-XR ?TAKE 1 CAPSULE DAILY WITH BREAKFAST ?  ? ?  ? ? ?Allergies: No Known Allergies ? ?Past Medical History, Surgical history, Social history, and Family History were reviewed and updated. ? ?Review of Systems: ?All other 10 point review of systems is negative.  ? ?Physical Exam: ? weight is 165 lb (74.8 kg). His oral temperature is 98.4 ?F (36.9 ?C). His blood pressure is 119/72 and his pulse is 72. His respiration is 18 and oxygen saturation is 99%.  ? ?Wt Readings from Last 3 Encounters:  ?12/07/21 165 lb (74.8 kg)  ?11/09/21 166 lb (75.3 kg)  ?10/08/21 166 lb (75.3 kg)  ? ? ?Ocular: Sclerae unicteric, pupils equal, round and reactive to light ?Ear-nose-throat: Oropharynx clear, dentition fair ?Lymphatic: No cervical or supraclavicular adenopathy ?Lungs no rales or rhonchi, good excursion bilaterally ?Heart regular rate and rhythm, no murmur appreciated ?Abd soft, nontender, positive bowel sounds ?MSK no focal spinal tenderness, no  joint edema ?Neuro: non-focal, well-oriented, appropriate affect ?Breasts: Deferred  ? ?Lab Results  ?Component Value Date  ? WBC 4.6 12/07/2021  ? HGB 10.1 (L) 12/07/2021  ? HCT 30.8 (L) 12/07/2021  ? MCV 109.2 (H) 12/07/2021  ? PLT 83 (L) 12/07/2021  ? ?Lab Results  ?Component Value Date  ? FERRITIN 192 11/09/2021  ? IRON 126 11/09/2021  ? TIBC 323 11/09/2021  ? UIBC 197 11/09/2021  ? IRONPCTSAT 39 11/09/2021  ? ?Lab Results  ?Component Value Date  ? RETICCTPCT 1.1 12/07/2021  ? RBC 2.82 (L) 12/07/2021  ? RBC 2.81 (L) 12/07/2021  ? ?No results found for: KPAFRELGTCHN,  LAMBDASER, KAPLAMBRATIO ?No results found for: IGGSERUM, IGA, IGMSERUM ?No results found for: TOTALPROTELP, ALBUMINELP, A1GS, A2GS, BETS, BETA2SER, GAMS, MSPIKE, SPEI ?  Chemistry   ?   ?Component Value Date/Time  ? NA 140 11/09/2021 1424  ? NA 144 02/26/2021 0000  ? K 4.4 11/09/2021 1424  ? CL 103 11/09/2021 1424  ? CO2 30 11/09/2021 1424  ? BUN 24 (H) 11/09/2021 1424  ? BUN 21 02/26/2021 0000  ? CREATININE 1.32 (H) 11/09/2021 1424  ? CREATININE 1.14 (H) 06/04/2020 1041  ? GLU 94 02/26/2021 0000  ?    ?Component Value Date/Time  ? CALCIUM 9.9 11/09/2021 1424  ? ALKPHOS 61 11/09/2021 1424  ? AST 18 11/09/2021 1424  ? ALT 14 11/09/2021 1424  ? BILITOT 0.7 11/09/2021 1424  ?  ? ? ? ?Impression and Plan: Andrew Ramos is a very pleasant 88 yo gentleman with CML and multifactorial anemia. ?ESA given, Hgb 10.1.  ?Iron studies pending.  ?Lab and injection monthly, follow-up in 2 months.  ? ?Lottie Dawson, NP ?3/21/20232:36 PM  ?

## 2021-12-08 ENCOUNTER — Telehealth: Payer: Self-pay | Admitting: *Deleted

## 2021-12-08 LAB — FERRITIN: Ferritin: 154 ng/mL (ref 24–336)

## 2021-12-08 LAB — IRON AND IRON BINDING CAPACITY (CC-WL,HP ONLY)
Iron: 101 ug/dL (ref 45–182)
Saturation Ratios: 30 % (ref 17.9–39.5)
TIBC: 339 ug/dL (ref 250–450)
UIBC: 238 ug/dL (ref 117–376)

## 2021-12-08 NOTE — Telephone Encounter (Signed)
Per 12/07/21 los - called and gave upcoming appointments - confirmed ?

## 2021-12-22 ENCOUNTER — Other Ambulatory Visit: Payer: Self-pay | Admitting: Family Medicine

## 2021-12-22 DIAGNOSIS — F32A Depression, unspecified: Secondary | ICD-10-CM

## 2021-12-28 ENCOUNTER — Ambulatory Visit (INDEPENDENT_AMBULATORY_CARE_PROVIDER_SITE_OTHER): Payer: Medicare Other | Admitting: Physician Assistant

## 2021-12-28 ENCOUNTER — Encounter: Payer: Self-pay | Admitting: Physician Assistant

## 2021-12-28 VITALS — BP 136/80 | HR 97 | Resp 18 | Ht 72.0 in | Wt 161.0 lb

## 2021-12-28 DIAGNOSIS — G3184 Mild cognitive impairment, so stated: Secondary | ICD-10-CM

## 2021-12-28 NOTE — Progress Notes (Signed)
? ?Assessment/Plan:  ? 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 ?MMSE today is 29/30, he is stable from the cognitive standpoint. ? ?Discussed safety both in and out of the home. Discussed the importance of regular daily schedule with inclusion of crossword puzzles to maintain brain function.  ?Continue to stay active exercising  at least 30 minutes at least 3 times a week.  ?Naps should be scheduled and should be no longer than 60 minutes and should not occur after 2 PM.  ?Mediterranean diet is recommended information was given in his AVS ?Follow-up in 6 months ?Continue donepezil 10 mg daily  ?Repeat neurocognitive testing after next visit ? ? ?Case discussed with Dr. Delice Lesch who agrees with the plan ? ? ?Subjective:  ? ?Andrew Ramos with a history of Chronic myelomonocytic leukemia --intermediate 2  risk on observation (BMBx 11/06/20), hypertension, hyperlipidemia, diabetes mellitus, hypothyroidism, prostate cancer, melanoma, polymyalgia rheumatica, depression seen today for evaluation of mild Vascular Dementia. Last MOCA blind on Oct 08, 2019 18/22. He is on Donepezil 10 mg daily. was seen today in follow up for memory loss.  The patient is here alone. My previous records as well as any outside records available were reviewed prior to todays visit.  Patient currently on Donepezil 10 mg daily and tolerating well.   ?He reports feeling well, he does not report any specific complaints.  He denies repeating himself.  He continues to live with his wife at [facility] near [place].  He denies any dizziness, unilateral weakness, or headaches.  He denies any recent falls and does not walk with a walker,  occasionally he uses a cane.  He remains active, participates in a walking program of 30 minutes to 1 hour, plays card games with his friends. He no longer likes to sing in [place].  He denies any paranoia or hallucinations. He sleeps "like a log " between 6 and 8 hours, and only naps about  30 minutes a day.  He reports some vivid dreams more frequently but denies REM behavior, no sleepwalking. His appetite is good, denies any trouble swallowing, nausea or vomiting.  He reports intermittent constipation, but he takes laxative with good results.  He cooks, but denies leaving the stove on or any other appliances or faucets on.  He denies any urinary incontinence.  Takes meds by himself, denies missing any doses or overdosing.  He bathes and changes clothes independently.  His mood is good and cheerful, denies any episodes of depression. He pays his own bills, and denies missing any, as he has automatic payments.  He continues to drive, denies finding himself lost, denies disorientation.   ? ? ?History on Initial Assessment 09/23/2016: This is a pleasant 88 yo RH man with a history of hypertension, hyperlipidemia, prostate cancer, melanoma, presenting for evaluation of worsening memory and personality changes. Andrew Ramos himself thinks that he is doing okay, but "people keep telling me I have too many things to think about." He only mostly notices memory problems when he is in a hurry. He states he occasionally forgets conversations but feels this is due to his poor hearing. He denies getting lost driving, no missed medications. He has missed some bill payments a couple of years ago, none recently. His son states he is not nearly as sharp as he once was, he misplaces things frequently and asks the same questions repeatedly. He relates what the patient's wife has told him, that he is more argumentative. He has left  the stove burner on at least twice in the past couple of weeks. He has walked away from the sink running. They have noticed more dents on the car, which the patient is unsure how they happened. They both wonder if these were from parking lot incidents because he states he has never hit another car. No difficulties with dressing/bathing independently, hygiene is good. On review of PCP notes where  wife was present, she reported he forgets to zip his pants, he forgets things he has offered his wife. He brings the wrong utensils to eat with. He forgets simple things his wife asks for. He states that she give him too many instructions. He loses temper with his wife frequently, they are fighting more and she is tired of people telling her to be more patient with him. He was noted to have a blunt affect with paranoid thought content on his PCP visit. MMSE in 06/2016 was 30/30. He was started on Donepezil '5mg'$  daily, which he is tolerating without side effects.  ?He denies any headaches, dizziness, diplopia, dysarthria, dysphagia, neck/back pain, focal numbness/tingling/weakness, bladder dysfunction. No anosmia, tremors, no falls. He has occasional constipation. His brother who is 7 years younger than him was diagnosed with dementia after he got lost driving and could not be found for 2 weeks. He denies any significant head injuries, he drinks alcohol very occasionally.  ?  ?Diagnostic Data: MRI brain with and without contrast did not show any acute changes. There was moderate diffuse volume loss and mild chronic microvascular disease. ?  ?Neurocognitive evaluation in 12/2017 showed non-amnestic mild cognitive impairment (dysexecutive features - likely vascular cognitive impairment). Results and level of functioning did not warrant a diagnosis of dementia. No signs of a primary psychiatric disorder. Increased irritability/mood changes are likely related to MCI and frontal-subcortical involvement.  ?  ? ?PREVIOUS MEDICATIONS: None ? ?CURRENT MEDICATIONS:  ?Outpatient Encounter Medications as of 12/28/2021  ?Medication Sig  ? atorvastatin (LIPITOR) 40 MG tablet Take 1 tablet (40 mg total) by mouth daily.  ? Calcium Carbonate-Vitamin D (CALCIUM 600 + D PO) Take 1 tablet by mouth every morning.   ? Cholecalciferol (VITAMIN D3) 1000 UNITS tablet Take 1,000 Units by mouth every morning.  ? docusate sodium (COLACE) 100 MG  capsule Take 200 mg by mouth daily as needed for mild constipation.  ? donepezil (ARICEPT) 10 MG tablet Take 1 tablet (10 mg total) by mouth at bedtime.  ? fluticasone (FLONASE) 50 MCG/ACT nasal spray Place 2 sprays into both nostrils daily.  ? hydroxychloroquine (PLAQUENIL) 200 MG tablet Take 1 tablet by mouth 2 (two) times daily.  ? Iron, Ferrous Sulfate, 325 (65 Fe) MG TABS Take 325 mg by mouth daily.  ? lisinopril-hydrochlorothiazide (ZESTORETIC) 10-12.5 MG tablet TAKE 1 TABLET DAILY  ? loratadine (CLARITIN) 10 MG tablet TAKE 1 TABLET DAILY  ? Multiple Vitamins-Minerals (CENTRUM SILVER ULTRA MENS) TABS Take 1 tablet by mouth every evening.  ? rosuvastatin (CRESTOR) 40 MG tablet Take 0.5 tablets by mouth daily.  ? terazosin (HYTRIN) 1 MG capsule TAKE 1 CAPSULE AT BEDTIME  ? venlafaxine XR (EFFEXOR-XR) 75 MG 24 hr capsule TAKE 1 CAPSULE DAILY WITH BREAKFAST  ? ?No facility-administered encounter medications on file as of 12/28/2021.  ? ? ? ?Objective:  ?  ? ?PHYSICAL EXAMINATION:   ? ?VITALS:   ?Vitals:  ? 12/28/21 1158  ?BP: 136/80  ?Pulse: 97  ?Resp: 18  ?SpO2: 100%  ?Weight: 161 lb (73 kg)  ?Height: 6' (1.829 m)  ? ? ? ?  GEN:  The patient appears stated age and is in NAD.  ?HEENT:  Normocephalic, atraumatic.  ? ?Neurological examination: ? ?Orientation: The patient is alert. Oriented to person, place and date ?Cranial nerves: There is good facial symmetry.The speech is fluent and clear.  Hearing is intact to conversational tone with hearing aids.  ?Sensation: Sensation is intact to light touch throughout ?Motor: Strength is at least antigravity x4. ? ?Movement examination: ?Tone: There is normal tone in the UE/LE ?Abnormal movements:  very mild RUE hand intention tremor.  No myoclonus.  No asterixis.  No cogwheeling ?Coordination:  There is no decremation with RAM's. ?Gait and Station: The patient has no difficulty arising out of a deep-seated chair without the use of the hands. The patient's stride length is  good.   ? ? ?  12/29/2021  ? 12:00 PM 09/25/2017  ?  9:00 AM 05/08/2017  ? 11:33 AM  ?MMSE - Mini Mental State Exam  ?Orientation to time '5 5 5  '$ ?Orientation to Place '5 5 5  '$ ?Registration '3 3 3  '$ ?Attention/ Cal

## 2021-12-28 NOTE — Patient Instructions (Addendum)
It was a pleasure to see you today at our office.  ? ?Recommendations: ? ?Meds: ?Follow up in 6  Months Oct 12 at 11:30 am  ?Continue donepezil 10 mg daily. Side effects were discussed  ? ?  ? ?RECOMMENDATIONS FOR ALL PATIENTS WITH MEMORY PROBLEMS: ?1. Continue to exercise (Recommend 30 minutes of walking everyday, or 3 hours every week) ?2. Increase social interactions - continue going to [place] and enjoy social gatherings with friends and family ?3. Eat healthy, avoid fried foods and eat more fruits and vegetables ?4. Maintain adequate blood pressure, blood sugar, and blood cholesterol level. Reducing the risk of stroke and cardiovascular disease also helps promoting better memory. ?5. Avoid stressful situations. Live a simple life and avoid aggravations. Organize your time and prepare for the next day in anticipation. ?6. Sleep well, avoid any interruptions of sleep and avoid any distractions in the bedroom that may interfere with adequate sleep quality ?7. Avoid sugar, avoid sweets as there is a strong link between excessive sugar intake, diabetes, and cognitive impairment ?We discussed the Mediterranean diet, which has been shown to help patients reduce the risk of progressive memory disorders and reduces cardiovascular risk. This includes eating fish, eat fruits and green leafy vegetables, nuts like almonds and hazelnuts, walnuts, and also use olive oil. Avoid fast foods and fried foods as much as possible. Avoid sweets and sugar as sugar use has been linked to worsening of memory function. ? ?There is always a concern of gradual progression of memory problems. If this is the case, then we may need to adjust level of care according to patient needs. Support, both to the patient and caregiver, should then be put into place.  ? ? ? ? ?FALL PRECAUTIONS: Be cautious when walking. Scan the area for obstacles that may increase the risk of trips and falls. When getting up in the mornings, sit up at the edge of the  bed for a few minutes before getting out of bed. Consider elevating the bed at the head end to avoid drop of blood pressure when getting up. Walk always in a well-lit room (use night lights in the walls). Avoid area rugs or power cords from appliances in the middle of the walkways. Use a walker or a cane if necessary and consider physical therapy for balance exercise. Get your eyesight checked regularly. ? ? ?HOME SAFETY: Consider the safety of the kitchen when operating appliances like stoves, microwave oven, and blender. Consider having supervision and share cooking responsibilities until no longer able to participate in those. Accidents with firearms and other hazards in the house should be identified and addressed as well. ? ? ?DRIVING: Regarding driving, in patients with progressive memory problems, driving will be impaired. We advise to have someone else do the driving if trouble finding directions or if minor accidents are reported. Independent driving assessment is available to determine safety of driving. ? ? ? ? ? ? ?Mediterranean Diet ?A Mediterranean diet refers to food and lifestyle choices that are based on the traditions of countries located on the [employer]. This way of eating has been shown to help prevent certain conditions and improve outcomes for people who have chronic diseases, like kidney disease and heart disease. ?What are tips for following this plan? ?Lifestyle  ?Cook and eat meals together with your family, when possible. ?Drink enough fluid to keep your urine clear or pale yellow. ?Be physically active every day. This includes: ?Aerobic exercise like running or swimming. ?Leisure activities  like gardening, walking, or housework. ?Get 7-8 hours of sleep each night. ?If recommended by your health care provider, drink red wine in moderation. This means 1 glass a day for nonpregnant women and 2 glasses a day for men. A glass of wine equals 5 oz (150 mL). ?Reading food labels  ?Check  the serving size of packaged foods. For foods such as rice and pasta, the serving size refers to the amount of cooked product, not dry. ?Check the total fat in packaged foods. Avoid foods that have saturated fat or trans fats. ?Check the ingredients list for added sugars, such as corn syrup. ?Shopping  ?At the grocery store, buy most of your food from the areas near the walls of the store. This includes: ?Fresh fruits and vegetables (produce). ?Grains, beans, nuts, and seeds. Some of these may be available in unpackaged forms or large amounts (in bulk). ?Fresh seafood. ?Poultry and eggs. ?Low-fat dairy products. ?Buy whole ingredients instead of prepackaged foods. ?Buy fresh fruits and vegetables in-season from local farmers markets. ?Buy frozen fruits and vegetables in resealable bags. ?If you do not have access to quality fresh seafood, buy precooked frozen shrimp or canned fish, such as tuna, salmon, or sardines. ?Buy small amounts of raw or cooked vegetables, salads, or olives from the deli or salad bar at your store. ?Stock your pantry so you always have certain foods on hand, such as olive oil, canned tuna, canned tomatoes, rice, pasta, and beans. ?Cooking  ?Cook foods with extra-virgin olive oil instead of using butter or other vegetable oils. ?Have meat as a side dish, and have vegetables or grains as your main dish. This means having meat in small portions or adding small amounts of meat to foods like pasta or stew. ?Use beans or vegetables instead of meat in common dishes like chili or lasagna. ?Experiment with different cooking methods. Try roasting or broiling vegetables instead of steaming or saut?eing them. ?Add frozen vegetables to soups, stews, pasta, or rice. ?Add nuts or seeds for added healthy fat at each meal. You can add these to yogurt, salads, or vegetable dishes. ?Marinate fish or vegetables using olive oil, lemon juice, garlic, and fresh herbs. ?Meal planning  ?Plan to eat 1 vegetarian meal  one day each week. Try to work up to 2 vegetarian meals, if possible. ?Eat seafood 2 or more times a week. ?Have healthy snacks readily available, such as: ?Vegetable sticks with hummus. ?[place] yogurt. ?Fruit and nut trail mix. ?Eat balanced meals throughout the week. This includes: ?Fruit: 2-3 servings a day ?Vegetables: 4-5 servings a day ?Low-fat dairy: 2 servings a day ?Fish, poultry, or lean meat: 1 serving a day ?Beans and legumes: 2 or more servings a week ?Nuts and seeds: 1-2 servings a day ?Whole grains: 6-8 servings a day ?Extra-virgin olive oil: 3-4 servings a day ?Limit red meat and sweets to only a few servings a month ?What are my food choices? ?Mediterranean diet ?Recommended ?Grains: Whole-grain pasta. Brown rice. Bulgar wheat. Polenta. Couscous. Whole-wheat bread. [place]. ?Vegetables: Artichokes. Beets. Broccoli. Cabbage. Carrots. Eggplant. Green beans. Chard. Kale. Spinach. Onions. Leeks. Peas. Squash. Tomatoes. Peppers. Radishes. ?Fruits: Apples. Apricots. Avocado. Berries. Bananas. Cherries. Dates. Figs. Grapes. Lemons. Melon. Oranges. Peaches. Plums. Pomegranate. ?Meats and other protein foods: Beans. Almonds. Sunflower seeds. Pine nuts. Peanuts. [place]. Salmon. Scallops. Shrimp. [place]. Tilapia. Clams. Oysters. Eggs. ?Dairy: Low-fat milk. Cheese. Greek yogurt. ?Beverages: Water. Red wine. Herbal tea. ?Fats and oils: Extra virgin olive oil. Avocado oil. Grape seed oil. ?  Sweets and desserts: [place] yogurt with honey. Baked apples. Poached pears. Trail mix. ?Seasoning and other foods: Basil. Cilantro. Coriander. Cumin. Mint. Parsley. Sage. Rosemary. Tarragon. Garlic. Oregano. Thyme. Pepper. Balsalmic vinegar. Tahini. Hummus. Tomato sauce. Olives. Mushrooms. ?Limit these ?Grains: Prepackaged pasta or rice dishes. Prepackaged cereal with added sugar. ?Vegetables: Deep fried potatoes (french fries). ?Fruits: Fruit canned in syrup. ?Meats and other protein foods: Beef. Pork. Lamb. Poultry with  skin. Hot dogs. [place] Salines. ?Dairy: Ice cream. Sour cream. Whole milk. ?Beverages: Juice. Sugar-sweetened soft drinks. Beer. Liquor and spirits. ?Fats and oils: Butter. Canola oil. Vegetable oil. Beef fat (tallo

## 2022-01-07 ENCOUNTER — Inpatient Hospital Stay: Payer: Medicare Other

## 2022-01-07 ENCOUNTER — Inpatient Hospital Stay: Payer: Medicare Other | Attending: Hematology & Oncology

## 2022-01-07 VITALS — BP 134/66 | HR 83 | Temp 98.4°F | Resp 18

## 2022-01-07 DIAGNOSIS — Z79899 Other long term (current) drug therapy: Secondary | ICD-10-CM | POA: Insufficient documentation

## 2022-01-07 DIAGNOSIS — C9310 Chronic myelomonocytic leukemia not having achieved remission: Secondary | ICD-10-CM

## 2022-01-07 DIAGNOSIS — D4620 Refractory anemia with excess of blasts, unspecified: Secondary | ICD-10-CM | POA: Diagnosis not present

## 2022-01-07 DIAGNOSIS — D509 Iron deficiency anemia, unspecified: Secondary | ICD-10-CM

## 2022-01-07 LAB — CBC WITH DIFFERENTIAL (CANCER CENTER ONLY)
Abs Immature Granulocytes: 0.01 10*3/uL (ref 0.00–0.07)
Basophils Absolute: 0 10*3/uL (ref 0.0–0.1)
Basophils Relative: 0 %
Eosinophils Absolute: 0 10*3/uL (ref 0.0–0.5)
Eosinophils Relative: 0 %
HCT: 28.8 % — ABNORMAL LOW (ref 39.0–52.0)
Hemoglobin: 9.5 g/dL — ABNORMAL LOW (ref 13.0–17.0)
Immature Granulocytes: 0 %
Lymphocytes Relative: 43 %
Lymphs Abs: 1.6 10*3/uL (ref 0.7–4.0)
MCH: 36 pg — ABNORMAL HIGH (ref 26.0–34.0)
MCHC: 33 g/dL (ref 30.0–36.0)
MCV: 109.1 fL — ABNORMAL HIGH (ref 80.0–100.0)
Monocytes Absolute: 1.5 10*3/uL — ABNORMAL HIGH (ref 0.1–1.0)
Monocytes Relative: 39 %
Neutro Abs: 0.7 10*3/uL — ABNORMAL LOW (ref 1.7–7.7)
Neutrophils Relative %: 18 %
Platelet Count: 91 10*3/uL — ABNORMAL LOW (ref 150–400)
RBC: 2.64 MIL/uL — ABNORMAL LOW (ref 4.22–5.81)
RDW: 14 % (ref 11.5–15.5)
WBC Count: 3.7 10*3/uL — ABNORMAL LOW (ref 4.0–10.5)
nRBC: 0 % (ref 0.0–0.2)

## 2022-01-07 LAB — CMP (CANCER CENTER ONLY)
ALT: 12 U/L (ref 0–44)
AST: 17 U/L (ref 15–41)
Albumin: 4 g/dL (ref 3.5–5.0)
Alkaline Phosphatase: 55 U/L (ref 38–126)
Anion gap: 5 (ref 5–15)
BUN: 32 mg/dL — ABNORMAL HIGH (ref 8–23)
CO2: 31 mmol/L (ref 22–32)
Calcium: 10.1 mg/dL (ref 8.9–10.3)
Chloride: 105 mmol/L (ref 98–111)
Creatinine: 1.11 mg/dL (ref 0.61–1.24)
GFR, Estimated: >60 mL/min (ref >60)
Glucose, Bld: 149 mg/dL — ABNORMAL HIGH (ref 70–99)
Potassium: 4.4 mmol/L (ref 3.5–5.1)
Sodium: 141 mmol/L (ref 135–145)
Total Bilirubin: 0.6 mg/dL (ref 0.3–1.2)
Total Protein: 6.6 g/dL (ref 6.5–8.1)

## 2022-01-07 LAB — RETICULOCYTES
Immature Retic Fract: 8.7 % (ref 2.3–15.9)
RBC.: 2.65 MIL/uL — ABNORMAL LOW (ref 4.22–5.81)
Retic Count, Absolute: 27.3 10*3/uL (ref 19.0–186.0)
Retic Ct Pct: 1 % (ref 0.4–3.1)

## 2022-01-07 LAB — FERRITIN: Ferritin: 99 ng/mL (ref 24–336)

## 2022-01-07 MED ORDER — SODIUM CHLORIDE 0.9% FLUSH: 3.0000 mL | Freq: Once | INTRAVENOUS | Status: DC | PRN

## 2022-01-07 MED ORDER — DARBEPOETIN ALFA 300 MCG/0.6ML IJ SOSY
300.0000 ug | PREFILLED_SYRINGE | Freq: Once | INTRAMUSCULAR | Status: AC
  Administered 2022-01-07: 300 ug via SUBCUTANEOUS
  Filled 2022-01-07: qty 0.6

## 2022-01-07 NOTE — Patient Instructions (Signed)
Darbepoetin Alfa injection ?What is this medication? ?DARBEPOETIN ALFA (dar be POE e tin  AL fa) helps your body make more red blood cells. It is used to treat anemia caused by chronic kidney failure and chemotherapy. ?This medicine may be used for other purposes; ask your health care provider or pharmacist if you have questions. ?COMMON BRAND NAME(S): Aranesp ?What should I tell my care team before I take this medication? ?They need to know if you have any of these conditions: ?blood clotting disorders or history of blood clots ?cancer patient not on chemotherapy ?cystic fibrosis ?heart disease, such as angina, heart failure, or a history of a heart attack ?hemoglobin level of 12 g/dL or greater ?high blood pressure ?low levels of folate, iron, or vitamin B12 ?seizures ?an unusual or allergic reaction to darbepoetin, erythropoietin, albumin, hamster proteins, latex, other medicines, foods, dyes, or preservatives ?pregnant or trying to get pregnant ?breast-feeding ?How should I use this medication? ?This medicine is for injection into a vein or under the skin. It is usually given by a health care professional in a hospital or clinic setting. ?If you get this medicine at home, you will be taught how to prepare and give this medicine. Use exactly as directed. Take your medicine at regular intervals. Do not take your medicine more often than directed. ?It is important that you put your used needles and syringes in a special sharps container. Do not put them in a trash can. If you do not have a sharps container, call your pharmacist or healthcare provider to get one. ?A special MedGuide will be given to you by the pharmacist with each prescription and refill. Be sure to read this information carefully each time. ?Talk to your pediatrician regarding the use of this medicine in children. While this medicine may be used in children as young as 1 month of age for selected conditions, precautions do apply. ?Overdosage: If  you think you have taken too much of this medicine contact a poison control center or emergency room at once. ?NOTE: This medicine is only for you. Do not share this medicine with others. ?What if I miss a dose? ?If you miss a dose, take it as soon as you can. If it is almost time for your next dose, take only that dose. Do not take double or extra doses. ?What may interact with this medication? ?Do not take this medicine with any of the following medications: ?epoetin alfa ?This list may not describe all possible interactions. Give your health care provider a list of all the medicines, herbs, non-prescription drugs, or dietary supplements you use. Also tell them if you smoke, drink alcohol, or use illegal drugs. Some items may interact with your medicine. ?What should I watch for while using this medication? ?Your condition will be monitored carefully while you are receiving this medicine. ?You may need blood work done while you are taking this medicine. ?This medicine may cause a decrease in vitamin B6. You should make sure that you get enough vitamin B6 while you are taking this medicine. Discuss the foods you eat and the vitamins you take with your health care professional. ?What side effects may I notice from receiving this medication? ?Side effects that you should report to your doctor or health care professional as soon as possible: ?allergic reactions like skin rash, itching or hives, swelling of the face, lips, or tongue ?breathing problems ?changes in vision ?chest pain ?confusion, trouble speaking or understanding ?feeling faint or lightheaded, falls ?high blood   pressure ?muscle aches or pains ?pain, swelling, warmth in the leg ?rapid weight gain ?severe headaches ?sudden numbness or weakness of the face, arm or leg ?trouble walking, dizziness, loss of balance or coordination ?seizures (convulsions) ?swelling of the ankles, feet, hands ?unusually weak or tired ?Side effects that usually do not require  medical attention (report to your doctor or health care professional if they continue or are bothersome): ?diarrhea ?fever, chills (flu-like symptoms) ?headaches ?nausea, vomiting ?redness, stinging, or swelling at site where injected ?This list may not describe all possible side effects. Call your doctor for medical advice about side effects. You may report side effects to FDA at 1-800-FDA-1088. ?Where should I keep my medication? ?Keep out of the reach of children and pets. ?Store in a refrigerator. Do not freeze. Do not shake. Throw away any unused portion if using a single-dose vial. Multi-dose vials can be kept in the refrigerator for up to 21 days after the initial dose. Throw away unused medicine. ?To get rid of medications that are no longer needed or have expired: ?Take the medication to a medication take-back program. Check with your pharmacy or law enforcement to find a location. ?If you cannot return the medication, ask your pharmacist or care team how to get rid of the medication safely. ?NOTE: This sheet is a summary. It may not cover all possible information. If you have questions about this medicine, talk to your doctor, pharmacist, or health care provider. ?? 2023 Elsevier/Gold Standard (2021-09-06 00:00:00) ? ?

## 2022-01-10 LAB — IRON AND IRON BINDING CAPACITY (CC-WL,HP ONLY)
Iron: 73 ug/dL (ref 45–182)
Saturation Ratios: 24 % (ref 17.9–39.5)
TIBC: 302 ug/dL (ref 250–450)
UIBC: 229 ug/dL (ref 117–376)

## 2022-01-20 ENCOUNTER — Telehealth: Payer: Self-pay | Admitting: Family Medicine

## 2022-01-20 NOTE — Telephone Encounter (Signed)
Left message for patient to call back and schedule Medicare Annual Wellness Visit (AWV).  ? ?Please offer to do virtually or by telephone.  Left office number and my jabber [phone]. ? ?Last AWV:05/31/2018 ? ?Please schedule at anytime with Nurse Health Advisor. ?  ?

## 2022-02-02 DIAGNOSIS — L821 Other seborrheic keratosis: Secondary | ICD-10-CM | POA: Diagnosis not present

## 2022-02-02 DIAGNOSIS — T148XXA Other injury of unspecified body region, initial encounter: Secondary | ICD-10-CM | POA: Diagnosis not present

## 2022-02-02 DIAGNOSIS — L820 Inflamed seborrheic keratosis: Secondary | ICD-10-CM | POA: Diagnosis not present

## 2022-02-02 DIAGNOSIS — D225 Melanocytic nevi of trunk: Secondary | ICD-10-CM | POA: Diagnosis not present

## 2022-02-02 DIAGNOSIS — Z85820 Personal history of malignant melanoma of skin: Secondary | ICD-10-CM | POA: Diagnosis not present

## 2022-02-02 DIAGNOSIS — L578 Other skin changes due to chronic exposure to nonionizing radiation: Secondary | ICD-10-CM | POA: Diagnosis not present

## 2022-02-02 DIAGNOSIS — L570 Actinic keratosis: Secondary | ICD-10-CM | POA: Diagnosis not present

## 2022-02-02 DIAGNOSIS — Z85828 Personal history of other malignant neoplasm of skin: Secondary | ICD-10-CM | POA: Diagnosis not present

## 2022-02-02 DIAGNOSIS — Z86018 Personal history of other benign neoplasm: Secondary | ICD-10-CM | POA: Diagnosis not present

## 2022-02-04 ENCOUNTER — Other Ambulatory Visit: Payer: Self-pay | Admitting: Family

## 2022-02-04 DIAGNOSIS — D4620 Refractory anemia with excess of blasts, unspecified: Secondary | ICD-10-CM

## 2022-02-04 DIAGNOSIS — D509 Iron deficiency anemia, unspecified: Secondary | ICD-10-CM

## 2022-02-04 DIAGNOSIS — C9310 Chronic myelomonocytic leukemia not having achieved remission: Secondary | ICD-10-CM

## 2022-02-07 ENCOUNTER — Inpatient Hospital Stay: Payer: Medicare Other | Attending: Hematology & Oncology

## 2022-02-07 ENCOUNTER — Inpatient Hospital Stay: Payer: Medicare Other

## 2022-02-07 ENCOUNTER — Inpatient Hospital Stay (HOSPITAL_BASED_OUTPATIENT_CLINIC_OR_DEPARTMENT_OTHER): Payer: Medicare Other | Admitting: Family

## 2022-02-07 ENCOUNTER — Encounter: Payer: Self-pay | Admitting: Family

## 2022-02-07 VITALS — BP 146/75 | HR 105 | Temp 98.3°F | Resp 17 | Wt 155.4 lb

## 2022-02-07 VITALS — HR 100

## 2022-02-07 DIAGNOSIS — Z79899 Other long term (current) drug therapy: Secondary | ICD-10-CM | POA: Insufficient documentation

## 2022-02-07 DIAGNOSIS — D509 Iron deficiency anemia, unspecified: Secondary | ICD-10-CM

## 2022-02-07 DIAGNOSIS — C9310 Chronic myelomonocytic leukemia not having achieved remission: Secondary | ICD-10-CM | POA: Insufficient documentation

## 2022-02-07 DIAGNOSIS — D4620 Refractory anemia with excess of blasts, unspecified: Secondary | ICD-10-CM | POA: Diagnosis not present

## 2022-02-07 LAB — CMP (CANCER CENTER ONLY)
ALT: 17 U/L (ref 0–44)
AST: 19 U/L (ref 15–41)
Albumin: 4 g/dL (ref 3.5–5.0)
Alkaline Phosphatase: 60 U/L (ref 38–126)
Anion gap: 10 (ref 5–15)
BUN: 27 mg/dL — ABNORMAL HIGH (ref 8–23)
CO2: 27 mmol/L (ref 22–32)
Calcium: 9.8 mg/dL (ref 8.9–10.3)
Chloride: 102 mmol/L (ref 98–111)
Creatinine: 1.08 mg/dL (ref 0.61–1.24)
GFR, Estimated: >60 mL/min (ref >60)
Glucose, Bld: 96 mg/dL (ref 70–99)
Potassium: 3.8 mmol/L (ref 3.5–5.1)
Sodium: 139 mmol/L (ref 135–145)
Total Bilirubin: 0.5 mg/dL (ref 0.3–1.2)
Total Protein: 7.2 g/dL (ref 6.5–8.1)

## 2022-02-07 LAB — CBC WITH DIFFERENTIAL (CANCER CENTER ONLY)
Abs Immature Granulocytes: 0.02 10*3/uL (ref 0.00–0.07)
Basophils Absolute: 0 10*3/uL (ref 0.0–0.1)
Basophils Relative: 0 %
Eosinophils Absolute: 0 10*3/uL (ref 0.0–0.5)
Eosinophils Relative: 0 %
HCT: 31.4 % — ABNORMAL LOW (ref 39.0–52.0)
Hemoglobin: 10.4 g/dL — ABNORMAL LOW (ref 13.0–17.0)
Immature Granulocytes: 0 %
Lymphocytes Relative: 38 %
Lymphs Abs: 1.9 10*3/uL (ref 0.7–4.0)
MCH: 35.4 pg — ABNORMAL HIGH (ref 26.0–34.0)
MCHC: 33.1 g/dL (ref 30.0–36.0)
MCV: 106.8 fL — ABNORMAL HIGH (ref 80.0–100.0)
Monocytes Absolute: 2.2 10*3/uL — ABNORMAL HIGH (ref 0.1–1.0)
Monocytes Relative: 45 %
Neutro Abs: 0.8 10*3/uL — ABNORMAL LOW (ref 1.7–7.7)
Neutrophils Relative %: 17 %
Platelet Count: 101 10*3/uL — ABNORMAL LOW (ref 150–400)
RBC: 2.94 MIL/uL — ABNORMAL LOW (ref 4.22–5.81)
RDW: 13.8 % (ref 11.5–15.5)
WBC Count: 4.9 10*3/uL (ref 4.0–10.5)
nRBC: 0 % (ref 0.0–0.2)

## 2022-02-07 LAB — RETICULOCYTES
Immature Retic Fract: 7.4 % (ref 2.3–15.9)
RBC.: 2.9 MIL/uL — ABNORMAL LOW (ref 4.22–5.81)
Retic Count, Absolute: 27 10*3/uL (ref 19.0–186.0)
Retic Ct Pct: 0.9 % (ref 0.4–3.1)

## 2022-02-07 LAB — FERRITIN: Ferritin: 122 ng/mL (ref 24–336)

## 2022-02-07 MED ORDER — DARBEPOETIN ALFA 300 MCG/0.6ML IJ SOSY
300.0000 ug | PREFILLED_SYRINGE | Freq: Once | INTRAMUSCULAR | Status: AC
  Administered 2022-02-07: 300 ug via SUBCUTANEOUS
  Filled 2022-02-07: qty 0.6

## 2022-02-07 NOTE — Patient Instructions (Signed)
Darbepoetin Alfa injection ?What is this medication? ?DARBEPOETIN ALFA (dar be POE e tin  AL fa) helps your body make more red blood cells. It is used to treat anemia caused by chronic kidney failure and chemotherapy. ?This medicine may be used for other purposes; ask your health care provider or pharmacist if you have questions. ?COMMON BRAND NAME(S): Aranesp ?What should I tell my care team before I take this medication? ?They need to know if you have any of these conditions: ?blood clotting disorders or history of blood clots ?cancer patient not on chemotherapy ?cystic fibrosis ?heart disease, such as angina, heart failure, or a history of a heart attack ?hemoglobin level of 12 g/dL or greater ?high blood pressure ?low levels of folate, iron, or vitamin B12 ?seizures ?an unusual or allergic reaction to darbepoetin, erythropoietin, albumin, hamster proteins, latex, other medicines, foods, dyes, or preservatives ?pregnant or trying to get pregnant ?breast-feeding ?How should I use this medication? ?This medicine is for injection into a vein or under the skin. It is usually given by a health care professional in a hospital or clinic setting. ?If you get this medicine at home, you will be taught how to prepare and give this medicine. Use exactly as directed. Take your medicine at regular intervals. Do not take your medicine more often than directed. ?It is important that you put your used needles and syringes in a special sharps container. Do not put them in a trash can. If you do not have a sharps container, call your pharmacist or healthcare provider to get one. ?A special MedGuide will be given to you by the pharmacist with each prescription and refill. Be sure to read this information carefully each time. ?Talk to your pediatrician regarding the use of this medicine in children. While this medicine may be used in children as young as 1 month of age for selected conditions, precautions do apply. ?Overdosage: If  you think you have taken too much of this medicine contact a poison control center or emergency room at once. ?NOTE: This medicine is only for you. Do not share this medicine with others. ?What if I miss a dose? ?If you miss a dose, take it as soon as you can. If it is almost time for your next dose, take only that dose. Do not take double or extra doses. ?What may interact with this medication? ?Do not take this medicine with any of the following medications: ?epoetin alfa ?This list may not describe all possible interactions. Give your health care provider a list of all the medicines, herbs, non-prescription drugs, or dietary supplements you use. Also tell them if you smoke, drink alcohol, or use illegal drugs. Some items may interact with your medicine. ?What should I watch for while using this medication? ?Your condition will be monitored carefully while you are receiving this medicine. ?You may need blood work done while you are taking this medicine. ?This medicine may cause a decrease in vitamin B6. You should make sure that you get enough vitamin B6 while you are taking this medicine. Discuss the foods you eat and the vitamins you take with your health care professional. ?What side effects may I notice from receiving this medication? ?Side effects that you should report to your doctor or health care professional as soon as possible: ?allergic reactions like skin rash, itching or hives, swelling of the face, lips, or tongue ?breathing problems ?changes in vision ?chest pain ?confusion, trouble speaking or understanding ?feeling faint or lightheaded, falls ?high blood   pressure ?muscle aches or pains ?pain, swelling, warmth in the leg ?rapid weight gain ?severe headaches ?sudden numbness or weakness of the face, arm or leg ?trouble walking, dizziness, loss of balance or coordination ?seizures (convulsions) ?swelling of the ankles, feet, hands ?unusually weak or tired ?Side effects that usually do not require  medical attention (report to your doctor or health care professional if they continue or are bothersome): ?diarrhea ?fever, chills (flu-like symptoms) ?headaches ?nausea, vomiting ?redness, stinging, or swelling at site where injected ?This list may not describe all possible side effects. Call your doctor for medical advice about side effects. You may report side effects to FDA at 1-800-FDA-1088. ?Where should I keep my medication? ?Keep out of the reach of children and pets. ?Store in a refrigerator. Do not freeze. Do not shake. Throw away any unused portion if using a single-dose vial. Multi-dose vials can be kept in the refrigerator for up to 21 days after the initial dose. Throw away unused medicine. ?To get rid of medications that are no longer needed or have expired: ?Take the medication to a medication take-back program. Check with your pharmacy or law enforcement to find a location. ?If you cannot return the medication, ask your pharmacist or care team how to get rid of the medication safely. ?NOTE: This sheet is a summary. It may not cover all possible information. If you have questions about this medicine, talk to your doctor, pharmacist, or health care provider. ?? 2023 Elsevier/Gold Standard (2021-09-06 00:00:00) ? ?

## 2022-02-07 NOTE — Progress Notes (Signed)
Hematology and Oncology Follow Up Visit  LYNDAL REGGIO [id number] 06/08/1932 88 y.o. 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 Rouch is here today for follow-up and injection. He is doing well but is staying quite busy caring for his wife who has a lot of health issues.  He has not noted any blood loss. He bruises easily on his arms and skin is thin.  No fever, chills, n/v, cough, rash, dizziness, SOB, chest pain, palpitations, abdominal pain or changes in bowel or bladder habits.  He has not noted any swelling, tenderness, numbness or tingling in his extremities.  No falls or syncope to report.  Appetite and hydration are good. His weight is stable at 155 lbs.   ECOG Performance Status: 1 - Symptomatic but completely ambulatory  Medications:  Allergies as of 02/07/2022   No Known Allergies      Medication List        Accurate as of Feb 07, 2022  2:29 PM. If you have any questions, ask your nurse or doctor.          atorvastatin 40 MG tablet Commonly known as: LIPITOR Take 1 tablet (40 mg total) by mouth daily.   CALCIUM 600 + D PO Take 1 tablet by mouth every morning.   Centrum Silver Ultra Mens Tabs Take 1 tablet by mouth every evening.   cholecalciferol 25 MCG (1000 UNIT) tablet Commonly known as: VITAMIN D Take 1,000 Units by mouth every morning.   docusate sodium 100 MG capsule Commonly known as: COLACE Take 200 mg by mouth daily as needed for mild constipation.   donepezil 10 MG tablet Commonly known as: ARICEPT Take 1 tablet (10 mg total) by mouth at bedtime.   fluticasone 50 MCG/ACT nasal spray Commonly known as: FLONASE Place 2 sprays into both nostrils daily.   hydroxychloroquine 200 MG tablet Commonly known as: PLAQUENIL Take 1 tablet by mouth 2 (two) times daily.   Iron  (Ferrous Sulfate) 325 (65 Fe) MG Tabs Take 325 mg by mouth daily.   lisinopril-hydrochlorothiazide 10-12.5 MG tablet Commonly known as: ZESTORETIC TAKE 1 TABLET DAILY   loratadine 10 MG tablet Commonly known as: CLARITIN TAKE 1 TABLET DAILY   rosuvastatin 40 MG tablet Commonly known as: CRESTOR Take 0.5 tablets by mouth daily.   terazosin 1 MG capsule Commonly known as: HYTRIN TAKE 1 CAPSULE AT BEDTIME   venlafaxine XR 75 MG 24 hr capsule Commonly known as: EFFEXOR-XR TAKE 1 CAPSULE DAILY WITH BREAKFAST        Allergies: No Known Allergies  Past Medical History, Surgical history, Social history, and Family History were reviewed and updated.  Review of Systems: All other 10 point review of systems is negative.   Physical Exam:  weight is 155 lb 6.4 oz (70.5 kg). His oral temperature is 98.3 F (36.8 C). His blood pressure is 146/75 (abnormal) and his pulse is 105 (abnormal). His respiration is 17 and oxygen saturation is 100%.   Wt Readings from Last 3 Encounters:  02/07/22 155 lb 6.4 oz (70.5 kg)  12/28/21 161 lb (73 kg)  12/07/21 165 lb (74.8 kg)    Ocular: Sclerae unicteric, pupils equal, round and reactive to light Ear-nose-throat: Oropharynx clear, dentition fair Lymphatic: No cervical or supraclavicular adenopathy Lungs no rales  or rhonchi, good excursion bilaterally Heart regular rate and rhythm, no murmur appreciated Abd soft, nontender, positive bowel sounds MSK no focal spinal tenderness, no joint edema Neuro: non-focal, well-oriented, appropriate affect Breasts: Deferred   Lab Results  Component Value Date   WBC 4.9 02/07/2022   HGB 10.4 (L) 02/07/2022   HCT 31.4 (L) 02/07/2022   MCV 106.8 (H) 02/07/2022   PLT 101 (L) 02/07/2022   Lab Results  Component Value Date   FERRITIN 99 01/07/2022   IRON 73 01/07/2022   TIBC 302 01/07/2022   UIBC 229 01/07/2022   IRONPCTSAT 24 01/07/2022   Lab Results  Component Value Date   RETICCTPCT 0.9  02/07/2022   RBC 2.90 (L) 02/07/2022   RBC 2.94 (L) 02/07/2022   No results found for: KPAFRELGTCHN, LAMBDASER, KAPLAMBRATIO No results found for: IGGSERUM, IGA, IGMSERUM No results found for: Odetta Pink, SPEI   Chemistry      Component Value Date/Time   NA 141 01/07/2022 1405   NA 144 02/26/2021 0000   K 4.4 01/07/2022 1405   CL 105 01/07/2022 1405   CO2 31 01/07/2022 1405   BUN 32 (H) 01/07/2022 1405   BUN 21 02/26/2021 0000   CREATININE 1.11 01/07/2022 1405   CREATININE 1.14 (H) 06/04/2020 1041   GLU 94 02/26/2021 0000      Component Value Date/Time   CALCIUM 10.1 01/07/2022 1405   ALKPHOS 55 01/07/2022 1405   AST 17 01/07/2022 1405   ALT 12 01/07/2022 1405   BILITOT 0.6 01/07/2022 1405       Impression and Plan: Mr. Lawhorn is a very pleasant 88 yo gentleman with CML and multifactorial anemia. ESA given, Hgb 10.4.  Iron studies are pending.  Lab and injection monthly, follow-up in 3 months.   Lottie Dawson, NP 5/22/20232:29 PM

## 2022-02-08 LAB — IRON AND IRON BINDING CAPACITY (CC-WL,HP ONLY)
Iron: 60 ug/dL (ref 45–182)
Saturation Ratios: 19 % (ref 17.9–39.5)
TIBC: 325 ug/dL (ref 250–450)
UIBC: 265 ug/dL (ref 117–376)

## 2022-02-09 ENCOUNTER — Telehealth: Payer: Self-pay | Admitting: *Deleted

## 2022-02-09 NOTE — Telephone Encounter (Signed)
Per 02/07/22 los - called and lvm of upcoming appointments - requested call back to  confirm - mailed calendar

## 2022-02-11 ENCOUNTER — Other Ambulatory Visit: Payer: Self-pay | Admitting: Family Medicine

## 2022-03-09 DIAGNOSIS — E113292 Type 2 diabetes mellitus with mild nonproliferative diabetic retinopathy without macular edema, left eye: Secondary | ICD-10-CM | POA: Diagnosis not present

## 2022-03-09 DIAGNOSIS — H26492 Other secondary cataract, left eye: Secondary | ICD-10-CM | POA: Diagnosis not present

## 2022-03-09 DIAGNOSIS — H35051 Retinal neovascularization, unspecified, right eye: Secondary | ICD-10-CM | POA: Diagnosis not present

## 2022-03-09 DIAGNOSIS — H43813 Vitreous degeneration, bilateral: Secondary | ICD-10-CM | POA: Diagnosis not present

## 2022-03-09 DIAGNOSIS — H40023 Open angle with borderline findings, high risk, bilateral: Secondary | ICD-10-CM | POA: Diagnosis not present

## 2022-03-09 DIAGNOSIS — Z961 Presence of intraocular lens: Secondary | ICD-10-CM | POA: Diagnosis not present

## 2022-03-09 DIAGNOSIS — H35363 Drusen (degenerative) of macula, bilateral: Secondary | ICD-10-CM | POA: Diagnosis not present

## 2022-03-10 ENCOUNTER — Inpatient Hospital Stay: Payer: Medicare Other

## 2022-03-10 ENCOUNTER — Inpatient Hospital Stay: Payer: Medicare Other | Attending: Hematology & Oncology

## 2022-04-08 ENCOUNTER — Inpatient Hospital Stay: Payer: Medicare Other

## 2022-04-08 ENCOUNTER — Inpatient Hospital Stay: Payer: Medicare Other | Attending: Hematology & Oncology

## 2022-04-08 VITALS — BP 153/86 | HR 110 | Temp 98.0°F | Resp 19

## 2022-04-08 DIAGNOSIS — D4620 Refractory anemia with excess of blasts, unspecified: Secondary | ICD-10-CM | POA: Diagnosis not present

## 2022-04-08 DIAGNOSIS — C9310 Chronic myelomonocytic leukemia not having achieved remission: Secondary | ICD-10-CM

## 2022-04-08 DIAGNOSIS — Z79899 Other long term (current) drug therapy: Secondary | ICD-10-CM | POA: Diagnosis not present

## 2022-04-08 DIAGNOSIS — D509 Iron deficiency anemia, unspecified: Secondary | ICD-10-CM

## 2022-04-08 LAB — CMP (CANCER CENTER ONLY)
ALT: 13 U/L (ref 0–44)
AST: 16 U/L (ref 15–41)
Albumin: 4.2 g/dL (ref 3.5–5.0)
Alkaline Phosphatase: 79 U/L (ref 38–126)
Anion gap: 9 (ref 5–15)
BUN: 22 mg/dL (ref 8–23)
CO2: 27 mmol/L (ref 22–32)
Calcium: 9.9 mg/dL (ref 8.9–10.3)
Chloride: 102 mmol/L (ref 98–111)
Creatinine: 1.06 mg/dL (ref 0.61–1.24)
GFR, Estimated: >60 mL/min (ref >60)
Glucose, Bld: 103 mg/dL — ABNORMAL HIGH (ref 70–99)
Potassium: 3.9 mmol/L (ref 3.5–5.1)
Sodium: 138 mmol/L (ref 135–145)
Total Bilirubin: 0.6 mg/dL (ref 0.3–1.2)
Total Protein: 7.2 g/dL (ref 6.5–8.1)

## 2022-04-08 LAB — CBC WITH DIFFERENTIAL (CANCER CENTER ONLY)
Abs Immature Granulocytes: 0.02 10*3/uL (ref 0.00–0.07)
Basophils Absolute: 0 10*3/uL (ref 0.0–0.1)
Basophils Relative: 0 %
Eosinophils Absolute: 0 10*3/uL (ref 0.0–0.5)
Eosinophils Relative: 0 %
HCT: 26.2 % — ABNORMAL LOW (ref 39.0–52.0)
Hemoglobin: 8.3 g/dL — ABNORMAL LOW (ref 13.0–17.0)
Immature Granulocytes: 0 %
Lymphocytes Relative: 37 %
Lymphs Abs: 2.1 10*3/uL (ref 0.7–4.0)
MCH: 34.3 pg — ABNORMAL HIGH (ref 26.0–34.0)
MCHC: 31.7 g/dL (ref 30.0–36.0)
MCV: 108.3 fL — ABNORMAL HIGH (ref 80.0–100.0)
Monocytes Absolute: 2.6 10*3/uL — ABNORMAL HIGH (ref 0.1–1.0)
Monocytes Relative: 45 %
Neutro Abs: 1 10*3/uL — ABNORMAL LOW (ref 1.7–7.7)
Neutrophils Relative %: 18 %
Platelet Count: 113 10*3/uL — ABNORMAL LOW (ref 150–400)
RBC: 2.42 MIL/uL — ABNORMAL LOW (ref 4.22–5.81)
RDW: 16.7 % — ABNORMAL HIGH (ref 11.5–15.5)
WBC Count: 5.8 10*3/uL (ref 4.0–10.5)
nRBC: 0 % (ref 0.0–0.2)

## 2022-04-08 MED ORDER — DARBEPOETIN ALFA 300 MCG/0.6ML IJ SOSY
300.0000 ug | PREFILLED_SYRINGE | Freq: Once | INTRAMUSCULAR | Status: AC
  Administered 2022-04-08: 300 ug via SUBCUTANEOUS
  Filled 2022-04-08: qty 0.6

## 2022-04-08 NOTE — Patient Instructions (Signed)
Darbepoetin Alfa injection ?What is this medication? ?DARBEPOETIN ALFA (dar be POE e tin  AL fa) helps your body make more red blood cells. It is used to treat anemia caused by chronic kidney failure and chemotherapy. ?This medicine may be used for other purposes; ask your health care provider or pharmacist if you have questions. ?COMMON BRAND NAME(S): Aranesp ?What should I tell my care team before I take this medication? ?They need to know if you have any of these conditions: ?blood clotting disorders or history of blood clots ?cancer patient not on chemotherapy ?cystic fibrosis ?heart disease, such as angina, heart failure, or a history of a heart attack ?hemoglobin level of 12 g/dL or greater ?high blood pressure ?low levels of folate, iron, or vitamin B12 ?seizures ?an unusual or allergic reaction to darbepoetin, erythropoietin, albumin, hamster proteins, latex, other medicines, foods, dyes, or preservatives ?pregnant or trying to get pregnant ?breast-feeding ?How should I use this medication? ?This medicine is for injection into a vein or under the skin. It is usually given by a health care professional in a hospital or clinic setting. ?If you get this medicine at home, you will be taught how to prepare and give this medicine. Use exactly as directed. Take your medicine at regular intervals. Do not take your medicine more often than directed. ?It is important that you put your used needles and syringes in a special sharps container. Do not put them in a trash can. If you do not have a sharps container, call your pharmacist or healthcare provider to get one. ?A special MedGuide will be given to you by the pharmacist with each prescription and refill. Be sure to read this information carefully each time. ?Talk to your pediatrician regarding the use of this medicine in children. While this medicine may be used in children as young as 1 month of age for selected conditions, precautions do apply. ?Overdosage: If  you think you have taken too much of this medicine contact a poison control center or emergency room at once. ?NOTE: This medicine is only for you. Do not share this medicine with others. ?What if I miss a dose? ?If you miss a dose, take it as soon as you can. If it is almost time for your next dose, take only that dose. Do not take double or extra doses. ?What may interact with this medication? ?Do not take this medicine with any of the following medications: ?epoetin alfa ?This list may not describe all possible interactions. Give your health care provider a list of all the medicines, herbs, non-prescription drugs, or dietary supplements you use. Also tell them if you smoke, drink alcohol, or use illegal drugs. Some items may interact with your medicine. ?What should I watch for while using this medication? ?Your condition will be monitored carefully while you are receiving this medicine. ?You may need blood work done while you are taking this medicine. ?This medicine may cause a decrease in vitamin B6. You should make sure that you get enough vitamin B6 while you are taking this medicine. Discuss the foods you eat and the vitamins you take with your health care professional. ?What side effects may I notice from receiving this medication? ?Side effects that you should report to your doctor or health care professional as soon as possible: ?allergic reactions like skin rash, itching or hives, swelling of the face, lips, or tongue ?breathing problems ?changes in vision ?chest pain ?confusion, trouble speaking or understanding ?feeling faint or lightheaded, falls ?high blood   pressure ?muscle aches or pains ?pain, swelling, warmth in the leg ?rapid weight gain ?severe headaches ?sudden numbness or weakness of the face, arm or leg ?trouble walking, dizziness, loss of balance or coordination ?seizures (convulsions) ?swelling of the ankles, feet, hands ?unusually weak or tired ?Side effects that usually do not require  medical attention (report to your doctor or health care professional if they continue or are bothersome): ?diarrhea ?fever, chills (flu-like symptoms) ?headaches ?nausea, vomiting ?redness, stinging, or swelling at site where injected ?This list may not describe all possible side effects. Call your doctor for medical advice about side effects. You may report side effects to FDA at 1-800-FDA-1088. ?Where should I keep my medication? ?Keep out of the reach of children and pets. ?Store in a refrigerator. Do not freeze. Do not shake. Throw away any unused portion if using a single-dose vial. Multi-dose vials can be kept in the refrigerator for up to 21 days after the initial dose. Throw away unused medicine. ?To get rid of medications that are no longer needed or have expired: ?Take the medication to a medication take-back program. Check with your pharmacy or law enforcement to find a location. ?If you cannot return the medication, ask your pharmacist or care team how to get rid of the medication safely. ?NOTE: This sheet is a summary. It may not cover all possible information. If you have questions about this medicine, talk to your doctor, pharmacist, or health care provider. ?? 2023 Elsevier/Gold Standard (2021-09-06 00:00:00) ? ?

## 2022-05-03 ENCOUNTER — Other Ambulatory Visit: Payer: Self-pay | Admitting: Family Medicine

## 2022-05-10 ENCOUNTER — Inpatient Hospital Stay: Payer: Medicare Other | Attending: Hematology & Oncology

## 2022-05-10 ENCOUNTER — Telehealth: Payer: Self-pay | Admitting: Family Medicine

## 2022-05-10 ENCOUNTER — Inpatient Hospital Stay: Payer: Medicare Other

## 2022-05-10 ENCOUNTER — Encounter: Payer: Self-pay | Admitting: Family

## 2022-05-10 ENCOUNTER — Inpatient Hospital Stay (HOSPITAL_BASED_OUTPATIENT_CLINIC_OR_DEPARTMENT_OTHER): Payer: Medicare Other | Admitting: Family

## 2022-05-10 VITALS — BP 110/49 | HR 96 | Temp 98.0°F | Resp 18 | Wt 161.4 lb

## 2022-05-10 DIAGNOSIS — C9310 Chronic myelomonocytic leukemia not having achieved remission: Secondary | ICD-10-CM

## 2022-05-10 DIAGNOSIS — D509 Iron deficiency anemia, unspecified: Secondary | ICD-10-CM

## 2022-05-10 DIAGNOSIS — D4620 Refractory anemia with excess of blasts, unspecified: Secondary | ICD-10-CM

## 2022-05-10 DIAGNOSIS — Z79899 Other long term (current) drug therapy: Secondary | ICD-10-CM | POA: Insufficient documentation

## 2022-05-10 DIAGNOSIS — J3089 Other allergic rhinitis: Secondary | ICD-10-CM

## 2022-05-10 LAB — CMP (CANCER CENTER ONLY)
ALT: 14 U/L (ref 0–44)
AST: 19 U/L (ref 15–41)
Albumin: 3.6 g/dL (ref 3.5–5.0)
Alkaline Phosphatase: 65 U/L (ref 38–126)
Anion gap: 11 (ref 5–15)
BUN: 19 mg/dL (ref 8–23)
CO2: 28 mmol/L (ref 22–32)
Calcium: 9.4 mg/dL (ref 8.9–10.3)
Chloride: 99 mmol/L (ref 98–111)
Creatinine: 1.44 mg/dL — ABNORMAL HIGH (ref 0.61–1.24)
GFR, Estimated: 47 mL/min — ABNORMAL LOW (ref >60)
Glucose, Bld: 108 mg/dL — ABNORMAL HIGH (ref 70–99)
Potassium: 3.2 mmol/L — ABNORMAL LOW (ref 3.5–5.1)
Sodium: 138 mmol/L (ref 135–145)
Total Bilirubin: 0.4 mg/dL (ref 0.3–1.2)
Total Protein: 7 g/dL (ref 6.5–8.1)

## 2022-05-10 LAB — CBC WITH DIFFERENTIAL (CANCER CENTER ONLY)
Abs Immature Granulocytes: 0.03 10*3/uL (ref 0.00–0.07)
Basophils Absolute: 0 10*3/uL (ref 0.0–0.1)
Basophils Relative: 0 %
Eosinophils Absolute: 0 10*3/uL (ref 0.0–0.5)
Eosinophils Relative: 0 %
HCT: 26.7 % — ABNORMAL LOW (ref 39.0–52.0)
Hemoglobin: 8.4 g/dL — ABNORMAL LOW (ref 13.0–17.0)
Immature Granulocytes: 1 %
Lymphocytes Relative: 33 %
Lymphs Abs: 1.9 10*3/uL (ref 0.7–4.0)
MCH: 34.3 pg — ABNORMAL HIGH (ref 26.0–34.0)
MCHC: 31.5 g/dL (ref 30.0–36.0)
MCV: 109 fL — ABNORMAL HIGH (ref 80.0–100.0)
Monocytes Absolute: 2.9 10*3/uL — ABNORMAL HIGH (ref 0.1–1.0)
Monocytes Relative: 49 %
Neutro Abs: 1 10*3/uL — ABNORMAL LOW (ref 1.7–7.7)
Neutrophils Relative %: 17 %
Platelet Count: 124 10*3/uL — ABNORMAL LOW (ref 150–400)
RBC: 2.45 MIL/uL — ABNORMAL LOW (ref 4.22–5.81)
RDW: 15.1 % (ref 11.5–15.5)
Smear Review: NORMAL
WBC Count: 5.9 10*3/uL (ref 4.0–10.5)
nRBC: 0 % (ref 0.0–0.2)

## 2022-05-10 LAB — RETICULOCYTES
Immature Retic Fract: 16.2 % — ABNORMAL HIGH (ref 2.3–15.9)
RBC.: 2.44 MIL/uL — ABNORMAL LOW (ref 4.22–5.81)
Retic Count, Absolute: 35.9 10*3/uL (ref 19.0–186.0)
Retic Ct Pct: 1.5 % (ref 0.4–3.1)

## 2022-05-10 LAB — FERRITIN: Ferritin: 87 ng/mL (ref 24–336)

## 2022-05-10 MED ORDER — FLUTICASONE PROPIONATE 50 MCG/ACT NA SUSP: 2.0000 | Freq: Every day | NASAL | 6 refills | Status: DC

## 2022-05-10 MED ORDER — DARBEPOETIN ALFA 300 MCG/0.6ML IJ SOSY
300.0000 ug | PREFILLED_SYRINGE | Freq: Once | INTRAMUSCULAR | Status: AC
  Administered 2022-05-10: 300 ug via SUBCUTANEOUS
  Filled 2022-05-10: qty 0.6

## 2022-05-10 NOTE — Telephone Encounter (Signed)
Refills sent

## 2022-05-10 NOTE — Progress Notes (Signed)
Hematology and Oncology Follow Up Visit  Andrew Ramos [id number] August 19, 1932 88 y.o. 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 He is staying busy caring for his sweet wife who was recently diagnosed with sarcoidosis effecting her lungs.  He has some mild fatigue at times. He enjoys getting out and walking his long haired dachshund Andrew Ramos.  No blood loss noted. No petechiae.  No fever, chills, n/v, cough, rash, dizziness, SOB, chest pain, palpitations, abdominal pain or changes in bowel or bladder habits.  No swelling, tenderness, numbness or tingling in his extremities.  No falls or syncope. He ambulates with a cane for added support.  Appetite and hydration are good. His weight is stable at 161 lbs.   ECOG Performance Status: 1 - Symptomatic but completely ambulatory  Medications:  Allergies as of 05/10/2022   No Known Allergies      Medication List        Accurate as of May 10, 2022  2:30 PM. If you have any questions, ask your nurse or doctor.          atorvastatin 40 MG tablet Commonly known as: LIPITOR TAKE 1 TABLET DAILY   CALCIUM 600 + D PO Take 1 tablet by mouth every morning.   Centrum Silver Ultra Mens Tabs Take 1 tablet by mouth every evening.   cholecalciferol 25 MCG (1000 UNIT) tablet Commonly known as: VITAMIN D3 Take 1,000 Units by mouth every morning.   docusate sodium 100 MG capsule Commonly known as: COLACE Take 200 mg by mouth daily as needed for mild constipation.   donepezil 10 MG tablet Commonly known as: ARICEPT Take 1 tablet (10 mg total) by mouth at bedtime.   fluticasone 50 MCG/ACT nasal spray Commonly known as: FLONASE Place 2 sprays into both nostrils daily.   hydroxychloroquine 200 MG tablet Commonly known as: PLAQUENIL Take  1 tablet by mouth 2 (two) times daily.   Iron (Ferrous Sulfate) 325 (65 Fe) MG Tabs Take 325 mg by mouth daily.   lisinopril-hydrochlorothiazide 10-12.5 MG tablet Commonly known as: ZESTORETIC TAKE 1 TABLET DAILY   loratadine 10 MG tablet Commonly known as: CLARITIN TAKE 1 TABLET DAILY   terazosin 1 MG capsule Commonly known as: HYTRIN TAKE 1 CAPSULE AT BEDTIME   venlafaxine XR 75 MG 24 hr capsule Commonly known as: EFFEXOR-XR TAKE 1 CAPSULE DAILY WITH BREAKFAST        Allergies: No Known Allergies  Past Medical History, Surgical history, Social history, and Family History were reviewed and updated.  Review of Systems: All other 10 point review of systems is negative.   Physical Exam:  weight is 161 lb 6.4 oz (73.2 kg). His oral temperature is 98 F (36.7 C). His blood pressure is 110/49 (abnormal) and his pulse is 96. His respiration is 18 and oxygen saturation is 100%.   Wt Readings from Last 3 Encounters:  05/10/22 161 lb 6.4 oz (73.2 kg)  02/07/22 155 lb 6.4 oz (70.5 kg)  12/28/21 161 lb (73 kg)    Ocular: Sclerae unicteric, pupils equal, round and reactive to light Ear-nose-throat: Oropharynx clear, dentition fair Lymphatic: No cervical or supraclavicular adenopathy Lungs no rales or rhonchi, good excursion bilaterally Heart regular rate and rhythm, no murmur appreciated Abd  soft, nontender, positive bowel sounds MSK no focal spinal tenderness, no joint edema Neuro: non-focal, well-oriented, appropriate affect Breasts: Deferred   Lab Results  Component Value Date   WBC 5.9 05/10/2022   HGB 8.4 (L) 05/10/2022   HCT 26.7 (L) 05/10/2022   MCV 109.0 (H) 05/10/2022   PLT 124 (L) 05/10/2022   Lab Results  Component Value Date   FERRITIN 122 02/07/2022   IRON 60 02/07/2022   TIBC 325 02/07/2022   UIBC 265 02/07/2022   IRONPCTSAT 19 02/07/2022   Lab Results  Component Value Date   RETICCTPCT 1.5 05/10/2022   RBC 2.44 (L) 05/10/2022   No results  found for: "KPAFRELGTCHN", "LAMBDASER", "KAPLAMBRATIO" No results found for: "IGGSERUM", "IGA", "IGMSERUM" No results found for: "TOTALPROTELP", "ALBUMINELP", "A1GS", "A2GS", "BETS", "BETA2SER", "GAMS", "MSPIKE", "SPEI"   Chemistry      Component Value Date/Time   NA 138 04/08/2022 1408   NA 144 02/26/2021 0000   K 3.9 04/08/2022 1408   CL 102 04/08/2022 1408   CO2 27 04/08/2022 1408   BUN 22 04/08/2022 1408   BUN 21 02/26/2021 0000   CREATININE 1.06 04/08/2022 1408   CREATININE 1.14 (H) 06/04/2020 1041   GLU 94 02/26/2021 0000      Component Value Date/Time   CALCIUM 9.9 04/08/2022 1408   ALKPHOS 79 04/08/2022 1408   AST 16 04/08/2022 1408   ALT 13 04/08/2022 1408   BILITOT 0.6 04/08/2022 1408       Impression and Plan: Andrew Ramos is a very pleasant 88 yo gentleman with CML and multifactorial anemia. ESA given, Hgb 8.4.  Iron studies are pending.  Lab and injection every 3 weeks.  Follow-up in 9 weeks.   Lottie Dawson, NP 8/22/20232:30 PM

## 2022-05-10 NOTE — Patient Instructions (Signed)

## 2022-05-10 NOTE — Telephone Encounter (Signed)
Medication: fluticasone (FLONASE) 50 MCG/ACT nasal spray   Has the patient contacted their pharmacy? yes  Preferred Pharmacy (with phone number or street name):  [facility] [place]  [address] [place] [address]  Phone:  [phone]  Fax:  [phone]   Agent: Please be advised that RX refills may take up to 3 business days. We ask that you follow-up with your pharmacy.

## 2022-05-11 ENCOUNTER — Other Ambulatory Visit: Payer: Self-pay | Admitting: Family

## 2022-05-11 ENCOUNTER — Telehealth: Payer: Self-pay | Admitting: *Deleted

## 2022-05-11 LAB — IRON AND IRON BINDING CAPACITY (CC-WL,HP ONLY)
Iron: 39 ug/dL — ABNORMAL LOW (ref 45–182)
Saturation Ratios: 12 % — ABNORMAL LOW (ref 17.9–39.5)
TIBC: 329 ug/dL (ref 250–450)
UIBC: 290 ug/dL (ref 117–376)

## 2022-05-11 NOTE — Telephone Encounter (Signed)
Per scheduling message Judson Roch - called and was uable to lvm to schedule (1) dose of IV Iron

## 2022-05-12 ENCOUNTER — Telehealth: Payer: Self-pay | Admitting: *Deleted

## 2022-05-12 NOTE — Telephone Encounter (Signed)
Per scheduling message [place] again and unable to lvm. Called his son and he will relay the message to call the office to get scheduled for (1) dose of IV Iron

## 2022-05-16 DIAGNOSIS — M7062 Trochanteric bursitis, left hip: Secondary | ICD-10-CM | POA: Diagnosis not present

## 2022-05-16 DIAGNOSIS — M25552 Pain in left hip: Secondary | ICD-10-CM | POA: Diagnosis not present

## 2022-05-19 ENCOUNTER — Inpatient Hospital Stay: Payer: Medicare Other

## 2022-05-20 ENCOUNTER — Telehealth: Payer: Self-pay | Admitting: *Deleted

## 2022-05-20 NOTE — Telephone Encounter (Signed)
Per scheduling message - Called and was unable to lvm to reschedule (1) dose of IV Iron

## 2022-05-31 ENCOUNTER — Inpatient Hospital Stay: Payer: Medicare Other

## 2022-05-31 ENCOUNTER — Inpatient Hospital Stay: Payer: Medicare Other | Attending: Hematology & Oncology

## 2022-05-31 VITALS — BP 135/62 | HR 95 | Temp 97.8°F | Resp 19

## 2022-05-31 DIAGNOSIS — C9310 Chronic myelomonocytic leukemia not having achieved remission: Secondary | ICD-10-CM | POA: Diagnosis not present

## 2022-05-31 DIAGNOSIS — Z79899 Other long term (current) drug therapy: Secondary | ICD-10-CM | POA: Insufficient documentation

## 2022-05-31 DIAGNOSIS — D509 Iron deficiency anemia, unspecified: Secondary | ICD-10-CM

## 2022-05-31 DIAGNOSIS — D4620 Refractory anemia with excess of blasts, unspecified: Secondary | ICD-10-CM

## 2022-05-31 LAB — CBC WITH DIFFERENTIAL (CANCER CENTER ONLY)
Abs Immature Granulocytes: 0.03 10*3/uL (ref 0.00–0.07)
Basophils Absolute: 0 10*3/uL (ref 0.0–0.1)
Basophils Relative: 0 %
Eosinophils Absolute: 0 10*3/uL (ref 0.0–0.5)
Eosinophils Relative: 0 %
HCT: 29 % — ABNORMAL LOW (ref 39.0–52.0)
Hemoglobin: 9.2 g/dL — ABNORMAL LOW (ref 13.0–17.0)
Immature Granulocytes: 1 %
Lymphocytes Relative: 36 %
Lymphs Abs: 2 10*3/uL (ref 0.7–4.0)
MCH: 34.6 pg — ABNORMAL HIGH (ref 26.0–34.0)
MCHC: 31.7 g/dL (ref 30.0–36.0)
MCV: 109 fL — ABNORMAL HIGH (ref 80.0–100.0)
Monocytes Absolute: 2.8 10*3/uL — ABNORMAL HIGH (ref 0.1–1.0)
Monocytes Relative: 49 %
Neutro Abs: 0.8 10*3/uL — ABNORMAL LOW (ref 1.7–7.7)
Neutrophils Relative %: 14 %
Platelet Count: 102 10*3/uL — ABNORMAL LOW (ref 150–400)
RBC: 2.66 MIL/uL — ABNORMAL LOW (ref 4.22–5.81)
RDW: 15.1 % (ref 11.5–15.5)
Smear Review: NORMAL
WBC Count: 5.6 10*3/uL (ref 4.0–10.5)
nRBC: 0 % (ref 0.0–0.2)

## 2022-05-31 LAB — CMP (CANCER CENTER ONLY)
ALT: 13 U/L (ref 0–44)
AST: 21 U/L (ref 15–41)
Albumin: 3.7 g/dL (ref 3.5–5.0)
Alkaline Phosphatase: 65 U/L (ref 38–126)
Anion gap: 9 (ref 5–15)
BUN: 20 mg/dL (ref 8–23)
CO2: 28 mmol/L (ref 22–32)
Calcium: 9.3 mg/dL (ref 8.9–10.3)
Chloride: 99 mmol/L (ref 98–111)
Creatinine: 1.5 mg/dL — ABNORMAL HIGH (ref 0.61–1.24)
GFR, Estimated: 45 mL/min — ABNORMAL LOW (ref >60)
Glucose, Bld: 85 mg/dL (ref 70–99)
Potassium: 4.1 mmol/L (ref 3.5–5.1)
Sodium: 136 mmol/L (ref 135–145)
Total Bilirubin: 0.5 mg/dL (ref 0.3–1.2)
Total Protein: 7 g/dL (ref 6.5–8.1)

## 2022-05-31 MED ORDER — DARBEPOETIN ALFA 300 MCG/0.6ML IJ SOSY
300.0000 ug | PREFILLED_SYRINGE | Freq: Once | INTRAMUSCULAR | Status: AC
  Administered 2022-05-31: 300 ug via SUBCUTANEOUS
  Filled 2022-05-31: qty 0.6

## 2022-05-31 NOTE — Patient Instructions (Signed)

## 2022-06-21 ENCOUNTER — Inpatient Hospital Stay: Payer: Medicare Other | Attending: Hematology & Oncology

## 2022-06-21 ENCOUNTER — Inpatient Hospital Stay: Payer: Medicare Other

## 2022-06-21 VITALS — BP 135/64 | HR 92 | Temp 97.9°F | Resp 18

## 2022-06-21 DIAGNOSIS — C9310 Chronic myelomonocytic leukemia not having achieved remission: Secondary | ICD-10-CM | POA: Insufficient documentation

## 2022-06-21 DIAGNOSIS — D4620 Refractory anemia with excess of blasts, unspecified: Secondary | ICD-10-CM | POA: Insufficient documentation

## 2022-06-21 DIAGNOSIS — Z79899 Other long term (current) drug therapy: Secondary | ICD-10-CM | POA: Diagnosis not present

## 2022-06-21 DIAGNOSIS — D509 Iron deficiency anemia, unspecified: Secondary | ICD-10-CM

## 2022-06-21 LAB — CBC WITH DIFFERENTIAL (CANCER CENTER ONLY)
Abs Immature Granulocytes: 0.02 10*3/uL (ref 0.00–0.07)
Basophils Absolute: 0 10*3/uL (ref 0.0–0.1)
Basophils Relative: 0 %
Eosinophils Absolute: 0 10*3/uL (ref 0.0–0.5)
Eosinophils Relative: 0 %
HCT: 29.8 % — ABNORMAL LOW (ref 39.0–52.0)
Hemoglobin: 9.6 g/dL — ABNORMAL LOW (ref 13.0–17.0)
Immature Granulocytes: 0 %
Lymphocytes Relative: 47 %
Lymphs Abs: 3 10*3/uL (ref 0.7–4.0)
MCH: 34.4 pg — ABNORMAL HIGH (ref 26.0–34.0)
MCHC: 32.2 g/dL (ref 30.0–36.0)
MCV: 106.8 fL — ABNORMAL HIGH (ref 80.0–100.0)
Monocytes Absolute: 2.6 10*3/uL — ABNORMAL HIGH (ref 0.1–1.0)
Monocytes Relative: 40 %
Neutro Abs: 0.8 10*3/uL — ABNORMAL LOW (ref 1.7–7.7)
Neutrophils Relative %: 13 %
Platelet Count: 112 10*3/uL — ABNORMAL LOW (ref 150–400)
RBC: 2.79 MIL/uL — ABNORMAL LOW (ref 4.22–5.81)
RDW: 15 % (ref 11.5–15.5)
Smear Review: NORMAL
WBC Count: 6.4 10*3/uL (ref 4.0–10.5)
WBC Morphology: ABNORMAL
nRBC: 0 % (ref 0.0–0.2)

## 2022-06-21 LAB — CMP (CANCER CENTER ONLY)
ALT: 10 U/L (ref 0–44)
AST: 14 U/L — ABNORMAL LOW (ref 15–41)
Albumin: 4.2 g/dL (ref 3.5–5.0)
Alkaline Phosphatase: 61 U/L (ref 38–126)
Anion gap: 6 (ref 5–15)
BUN: 22 mg/dL (ref 8–23)
CO2: 31 mmol/L (ref 22–32)
Calcium: 10.2 mg/dL (ref 8.9–10.3)
Chloride: 102 mmol/L (ref 98–111)
Creatinine: 1.48 mg/dL — ABNORMAL HIGH (ref 0.61–1.24)
GFR, Estimated: 45 mL/min — ABNORMAL LOW (ref >60)
Glucose, Bld: 109 mg/dL — ABNORMAL HIGH (ref 70–99)
Potassium: 4.5 mmol/L (ref 3.5–5.1)
Sodium: 139 mmol/L (ref 135–145)
Total Bilirubin: 0.5 mg/dL (ref 0.3–1.2)
Total Protein: 7.1 g/dL (ref 6.5–8.1)

## 2022-06-21 MED ORDER — DARBEPOETIN ALFA 300 MCG/0.6ML IJ SOSY
300.0000 ug | PREFILLED_SYRINGE | Freq: Once | INTRAMUSCULAR | Status: AC
  Administered 2022-06-21: 300 ug via SUBCUTANEOUS
  Filled 2022-06-21: qty 0.6

## 2022-06-21 NOTE — Patient Instructions (Signed)

## 2022-07-12 ENCOUNTER — Inpatient Hospital Stay (HOSPITAL_BASED_OUTPATIENT_CLINIC_OR_DEPARTMENT_OTHER): Payer: Medicare Other | Admitting: Family

## 2022-07-12 ENCOUNTER — Inpatient Hospital Stay: Payer: Medicare Other

## 2022-07-12 ENCOUNTER — Encounter: Payer: Self-pay | Admitting: Family

## 2022-07-12 ENCOUNTER — Encounter: Payer: Self-pay | Admitting: Physician Assistant

## 2022-07-12 ENCOUNTER — Ambulatory Visit (INDEPENDENT_AMBULATORY_CARE_PROVIDER_SITE_OTHER): Payer: Medicare Other | Admitting: Physician Assistant

## 2022-07-12 VITALS — BP 148/62 | HR 85 | Temp 97.8°F | Resp 18 | Wt 171.1 lb

## 2022-07-12 VITALS — BP 152/78 | HR 70 | Resp 18 | Wt 170.0 lb

## 2022-07-12 DIAGNOSIS — D509 Iron deficiency anemia, unspecified: Secondary | ICD-10-CM

## 2022-07-12 DIAGNOSIS — C9310 Chronic myelomonocytic leukemia not having achieved remission: Secondary | ICD-10-CM

## 2022-07-12 DIAGNOSIS — D4620 Refractory anemia with excess of blasts, unspecified: Secondary | ICD-10-CM

## 2022-07-12 DIAGNOSIS — G3184 Mild cognitive impairment, so stated: Secondary | ICD-10-CM | POA: Diagnosis not present

## 2022-07-12 DIAGNOSIS — Z79899 Other long term (current) drug therapy: Secondary | ICD-10-CM | POA: Diagnosis not present

## 2022-07-12 LAB — RETICULOCYTES
Immature Retic Fract: 10.5 % (ref 2.3–15.9)
RBC.: 3.27 MIL/uL — ABNORMAL LOW (ref 4.22–5.81)
Retic Count, Absolute: 46.2 10*3/uL (ref 19.0–186.0)
Retic Ct Pct: 1.4 % (ref 0.4–3.1)

## 2022-07-12 LAB — CMP (CANCER CENTER ONLY)
ALT: 12 U/L (ref 0–44)
AST: 16 U/L (ref 15–41)
Albumin: 4.5 g/dL (ref 3.5–5.0)
Alkaline Phosphatase: 72 U/L (ref 38–126)
Anion gap: 8 (ref 5–15)
BUN: 20 mg/dL (ref 8–23)
CO2: 29 mmol/L (ref 22–32)
Calcium: 10.2 mg/dL (ref 8.9–10.3)
Chloride: 103 mmol/L (ref 98–111)
Creatinine: 1.27 mg/dL — ABNORMAL HIGH (ref 0.61–1.24)
GFR, Estimated: 54 mL/min — ABNORMAL LOW (ref >60)
Glucose, Bld: 163 mg/dL — ABNORMAL HIGH (ref 70–99)
Potassium: 3.9 mmol/L (ref 3.5–5.1)
Sodium: 140 mmol/L (ref 135–145)
Total Bilirubin: 0.7 mg/dL (ref 0.3–1.2)
Total Protein: 7.4 g/dL (ref 6.5–8.1)

## 2022-07-12 LAB — CBC WITH DIFFERENTIAL (CANCER CENTER ONLY)
Abs Immature Granulocytes: 0.01 10*3/uL (ref 0.00–0.07)
Basophils Absolute: 0 10*3/uL (ref 0.0–0.1)
Basophils Relative: 0 %
Eosinophils Absolute: 0 10*3/uL (ref 0.0–0.5)
Eosinophils Relative: 0 %
HCT: 34 % — ABNORMAL LOW (ref 39.0–52.0)
Hemoglobin: 10.9 g/dL — ABNORMAL LOW (ref 13.0–17.0)
Immature Granulocytes: 0 %
Lymphocytes Relative: 55 %
Lymphs Abs: 2 10*3/uL (ref 0.7–4.0)
MCH: 33.6 pg (ref 26.0–34.0)
MCHC: 32.1 g/dL (ref 30.0–36.0)
MCV: 104.9 fL — ABNORMAL HIGH (ref 80.0–100.0)
Monocytes Absolute: 1.1 10*3/uL — ABNORMAL HIGH (ref 0.1–1.0)
Monocytes Relative: 30 %
Neutro Abs: 0.5 10*3/uL — ABNORMAL LOW (ref 1.7–7.7)
Neutrophils Relative %: 15 %
Platelet Count: 103 10*3/uL — ABNORMAL LOW (ref 150–400)
RBC: 3.24 MIL/uL — ABNORMAL LOW (ref 4.22–5.81)
RDW: 15 % (ref 11.5–15.5)
Smear Review: NORMAL
WBC Count: 3.6 10*3/uL — ABNORMAL LOW (ref 4.0–10.5)
nRBC: 0 % (ref 0.0–0.2)

## 2022-07-12 LAB — FERRITIN: Ferritin: 55 ng/mL (ref 24–336)

## 2022-07-12 MED ORDER — DARBEPOETIN ALFA 300 MCG/0.6ML IJ SOSY
300.0000 ug | PREFILLED_SYRINGE | Freq: Once | INTRAMUSCULAR | Status: AC
  Administered 2022-07-12: 300 ug via SUBCUTANEOUS
  Filled 2022-07-12: qty 0.6

## 2022-07-12 NOTE — Patient Instructions (Signed)

## 2022-07-12 NOTE — Progress Notes (Signed)
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 RH male Chronic myelomonocytic leukemia --intermediate 2  risk on observation (BMBx 11/06/20), hypertension, hyperlipidemia, diabetes mellitus, hypothyroidism, prostate cancer, melanoma, polymyalgia rheumatica, depression presenting today in follow-up for evaluation of memory loss. Patient is on donepezil 10 mg daily, tolerating well.  Of note, the patient has significant decreased hearing, with a recent revision, "I need to pick up my new hearing aids ".  This decreased hearing may be hindering his cognitive status, as he has a harder time comprehending.  At the same time, situational depression after moving to a new environment recently, may be contributing as well.  MMSE today is stable at 28/30, with delayed recall 3/3.    Recommendations:   Follow up in 6 months. Continue donepezil 10 mg daily. Side effects were discussed  Recommend good control of cardiovascular risk factors.   Continue to control mood as per PCP Recommend re-placing his hearing aids Repeat neurocognitive testing in 1 year     Subjective:   This patient is here with his son who supplements the history. Previous records as well as any outside records available were reviewed prior to todays visit.  Last seen on 12/28/21     Any changes in memory since last visit? No longer goes to sing at the [place], or play cards with his friends, part of it due to his hearing and also because recent move to independent living with his wife Andrew Ramos).  He is trying to find his way around the place.  He is trying to meet new people.  His son reports that he may forget recent conversations, and names of people as well as time.  Of note, the patient has significant decreased hearing, and is awaiting his new hearing aids.  repeats oneself?  Endorsed, especially asking the same questions and telling the same  stories Disoriented when walking into a room?  Patient denies   Leaving objects in unusual places?  "Always"-son says. 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 Ambulates  with difficulty?   Patient denies, sometimes uses a cane for stability. Participates on a walking program 30 mins to 1 hour  daily.    Recent falls?  Patient denies . He bruises easily, uses a cane for stability Any head injuries?  Patient denies   History of seizures?   Patient denies   Wandering behavior?  Patient denies   Patient drives?  He has lost 1 set of keys, last week he used the other set of keys and he left to get the dogs some food and was gone for 2 hrs. He is on "[employer]" and he has some good tracking, noting that "was all over the place, he was not sure why he was driving ".  His son is about to take over the driving, taking his keys., Any mood changes since last visit? When [place] has caused significant friction with his wife, and perhaps situational depression.  Hallucinations?  Patient denies   Paranoia?  Patient denies   Patient reports that  does not sleep well with occasional vivid dreams, REM behavior or sleepwalking. He wakes up q 4 h to feed the dog.  "Other than that, he takes 1 nap daily " History of sleep apnea?  Patient denies   Any hygiene concerns?  Less bathing and  oral care than before, wife tries to make him take a bath at least every 2 days, wars the same stained clothes.  He does not like to brush his teeth anymore. Independent of bathing and dressing?  Endorsed  Does the patient needs help with medications? Son is in charge  Who is in charge of the finances?  Son is taking over, uses automatic payments after falling victim of several scams Any changes in appetite?  Patient denies and he takes Ensure as well 3 a day    Patient have trouble swallowing? Patient denies   Does the patient cook?  No longer cooks because [place] has food  available for them.  Any kitchen accidents such as leaving the stove on? Patient denies   Any headaches?  Patient denies   Double vision? Patient denies   Any focal numbness or tingling?  Patient denies   Chronic back pain Patient denies   Unilateral weakness?  Patient denies   Any tremors?  Patient denies   Any history of anosmia?  Patient denies   Any incontinence of urine?  Denies   Any bowel dysfunction?   Patient denies      Patient lives with his wife   Initial Assessment 09/23/2016: This is a pleasant 88 yo RH man with a history of hypertension, hyperlipidemia, prostate cancer, melanoma, presenting for evaluation of worsening memory and personality changes. [name] himself thinks that he is doing okay, but "people keep telling me I have too many things to think about." He only mostly notices memory problems when he is in a hurry. He states he occasionally forgets conversations but feels this is due to his poor hearing. He denies getting lost driving, no missed medications. He has missed some bill payments a couple of years ago, none recently. His son states he is not nearly as sharp as he once was, he misplaces things frequently and asks the same questions repeatedly. He relates what the patient's wife has told him, that he is more argumentative. He has left the stove burner on at least twice in the past couple of weeks. He has walked away from the sink running. They have noticed more dents on the car, which the patient is unsure how they happened. They both wonder if these were from parking lot incidents because he states he has never hit another car. No difficulties with dressing/bathing independently, hygiene is good. On review of PCP notes where wife was present, she reported he forgets to zip his pants, he forgets things he has offered his wife. He brings the wrong utensils to eat with. He forgets simple things his wife asks for. He states that she give him too many instructions. He loses  temper with his wife frequently, they are fighting more and she is tired of people telling her to be more patient with him. He was noted to have a blunt affect with paranoid thought content on his PCP visit. MMSE in 06/2016 was 30/30. He was started on Donepezil '5mg'$  daily, which he is tolerating without side effects.  He denies any headaches, dizziness, diplopia, dysarthria, dysphagia, neck/back pain, focal numbness/tingling/weakness, bladder dysfunction. No anosmia, tremors, no falls. He has occasional constipation. His brother who is 7 years younger than him was diagnosed with dementia after he got lost driving and could not be found for 2 weeks. He denies any significant head injuries, he drinks alcohol very occasionally.    Diagnostic Data: MRI brain with and without contrast did not show any  acute changes. There was moderate diffuse volume loss and mild chronic microvascular disease.   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 Results and level of functioning did not warrant a diagnosis of dementia. No signs of a primary psychiatric disorder. Increased irritability/mood changes are likely related to MCI and frontal-subcortical involvement.   Past Medical History:  Diagnosis Date   Chronic myelomonocytic leukemia ([place]) 11/25/2020   Colon polyps    Tubular Adenoma 2005   Diverticulosis    Goals of care, counseling/discussion 11/25/2020   Hepatitis    pt told by [facility] has hepatitis antibodies in blood    Hyperlipidemia    Hypertension    Low grade myelodysplastic syndrome lesions ([employer]) 10/26/2020   Melanoma ([place])    Nonmelanoma skin cancer    Osteoarthritis    right hip- none since hip replacement   Polymyalgia rheumatica ([facility])    Polyposis of colon    Prostate cancer ([place]) 2007   s/p radiation seed implants   Prostate cancer [facility])    Thyroid disease sees dr altzhimer for yearly    nodules     Past Surgical History:  Procedure Laterality Date   CATARACT EXTRACTION     POLYPECTOMY     TONSILLECTOMY  as child   TOTAL HIP ARTHROPLASTY  08/27/2008   left   TOTAL HIP ARTHROPLASTY  06/12/2012   Procedure: TOTAL HIP ARTHROPLASTY ANTERIOR APPROACH;  Surgeon: Mauri Pole, MD;  Location: [facility];  Service: Orthopedics;  Laterality: Right;   TOTAL KNEE ARTHROPLASTY  12/31/2008   left     PREVIOUS MEDICATIONS:   CURRENT MEDICATIONS:  Outpatient Encounter Medications as of 07/12/2022  Medication Sig   atorvastatin (LIPITOR) 40 MG tablet TAKE 1 TABLET DAILY   Calcium Carbonate-Vitamin D (CALCIUM 600 + D PO) Take 1 tablet by mouth every morning.    Cholecalciferol (VITAMIN D3) 1000 UNITS tablet Take 1,000 Units by mouth every morning.   docusate sodium (COLACE) 100 MG capsule Take 200 mg by mouth daily as needed for mild constipation.   donepezil (ARICEPT) 10 MG tablet Take 1 tablet (10 mg total) by mouth at bedtime.   fluticasone (FLONASE) 50 MCG/ACT nasal spray Place 2 sprays into both nostrils daily.   hydroxychloroquine (PLAQUENIL) 200 MG tablet Take 1 tablet by mouth 2 (two) times daily.   Iron, Ferrous Sulfate, 325 (65 Fe) MG TABS Take 325 mg by mouth daily.   lisinopril-hydrochlorothiazide (ZESTORETIC) 10-12.5 MG tablet TAKE 1 TABLET DAILY   loratadine (CLARITIN) 10 MG tablet TAKE 1 TABLET DAILY   Multiple Vitamins-Minerals (CENTRUM SILVER ULTRA MENS) TABS Take 1 tablet by mouth every evening.   terazosin (HYTRIN) 1 MG capsule TAKE 1 CAPSULE AT BEDTIME   venlafaxine XR (EFFEXOR-XR) 75 MG 24 hr capsule TAKE 1 CAPSULE DAILY WITH BREAKFAST   No facility-administered encounter medications on file as of 07/12/2022.     Objective:     PHYSICAL EXAMINATION:    VITALS:   Vitals:   07/12/22 1107  BP: (!) 152/78  Pulse: 70  Resp: 18  SpO2: 98%  Weight: 170 lb (77.1 kg)    GEN:  The patient appears stated age and is in NAD. HEENT:  Normocephalic, atraumatic.    Neurological examination:  General: NAD, well-groomed, appears stated age. Orientation: The patient is alert. Oriented to person, place and date Cranial nerves: There is good facial symmetry.The speech is fluent and clear. No aphasia or dysarthria. Fund of  knowledge is appropriate. Recent memory impaired and remote memory is normal.  Attention and concentration are normal.  Able to name objects and repeat phrases.  Hearing is intact to conversational tone.    Sensation: Sensation is intact to light touch throughout Motor: Strength is at least antigravity x4. DTR's 2/4 in UE/LE      10/08/2019    3:00 PM 03/01/2019    9:00 AM 09/23/2016    2:00 PM  Montreal Cognitive Assessment   Visuospatial/ Executive (0/5)   5  Naming (0/3)   3  Attention: Read list of digits (0/2) '2 2 2  '$ Attention: Read list of letters (0/1) '1 1 1  '$ Attention: Serial 7 subtraction starting at 100 (0/3) '3 3 3  '$ Language: Repeat phrase (0/2) 0 0 2  Language : Fluency (0/1) 0 0 0  Abstraction (0/2) 1 0 2  Delayed Recall (0/5) 5 0 2  Orientation (0/6) '6 6 6  '$ Total   26       12/29/2021   12:00 PM 09/25/2017    9:00 AM 05/08/2017   11:33 AM  MMSE - Mini Mental State Exam  Orientation to time '5 5 5  '$ Orientation to Place '5 5 5  '$ Registration '3 3 3  '$ Attention/ Calculation '5 5 5  '$ Recall '3 2 3  '$ Language- name 2 objects '2 2 2  '$ Language- repeat '1 1 1  '$ Language- follow 3 step command '3 3 3  '$ Language- read & follow direction '1 1 1  '$ Write a sentence '1 1 1  '$ Copy design 0 1 1  Total score '29 29 30       '$ Movement examination: Tone: There is normal tone in the UE/LE Abnormal movements:  no tremor.  No myoclonus.  No asterixis.   Coordination:  There is no decremation with RAM's. Normal finger to nose  Gait and Station: The patient has no difficulty arising out of a deep-seated chair without the use of the hands. The patient's stride length is good.  Gait is cautious and narrow.   Thank you for allowing [place] the  opportunity to participate in the care of this nice patient. Please do not hesitate to contact us for any questions or concerns.   Total time spent on today's visit was 31 minutes dedicated to this patient today, preparing to see patient, examining the patient, ordering tests and/or medications and counseling the patient, documenting clinical information in the EHR or other health record, independently interpreting results and communicating results to the patient/family, discussing treatment and goals, answering patient's questions and coordinating care.  Cc:  [facility] 07/12/2022 11:53 AM

## 2022-07-12 NOTE — Progress Notes (Signed)
Hematology and Oncology Follow Up Visit  Andrew Ramos [id number] [birth year] 88 y.o. 07/12/2022   Principle Diagnosis:  Chronic myelomonocytic leukemia --intermediate 2  risk   Current Therapy:        Aranesp 300 mcg sq q 3 week for Hgb <11 IV Iron as indicated    Interim History:  Andrew Ramos is here today with his son for follow-up and injection. Hgb today is 10.9.  No blood loss, abnormal bruising or petechiae noted.  No fever, chills, n/v, cough, rash, dizziness, SOB, chest pain, palpitations, abdominal pain or changes in bowel or bladder habits.  No swelling, tenderness, numbness or tingling in his extremities.   No falls or syncope.  Appetite and hydration have been good. Weight is stable at 171 lbs.   ECOG Performance Status: 1 - Symptomatic but completely ambulatory  Medications:  Allergies as of 07/12/2022   No Known Allergies      Medication List        Accurate as of July 12, 2022  1:57 PM. If you have any questions, ask your nurse or doctor.          atorvastatin 40 MG tablet Commonly known as: LIPITOR TAKE 1 TABLET DAILY   CALCIUM 600 + D PO Take 1 tablet by mouth every morning.   Centrum Silver Ultra Mens Tabs Take 1 tablet by mouth every evening.   cholecalciferol 25 MCG (1000 UNIT) tablet Commonly known as: VITAMIN D3 Take 1,000 Units by mouth every morning.   docusate sodium 100 MG capsule Commonly known as: COLACE Take 200 mg by mouth daily as needed for mild constipation.   donepezil 10 MG tablet Commonly known as: ARICEPT Take 1 tablet (10 mg total) by mouth at bedtime.   fluticasone 50 MCG/ACT nasal spray Commonly known as: FLONASE Place 2 sprays into both nostrils daily.   hydroxychloroquine 200 MG tablet Commonly known as: PLAQUENIL Take 1 tablet by mouth 2 (two) times daily.   Iron (Ferrous Sulfate) 325 (65 Fe) MG Tabs Take 325 mg by mouth daily.   lisinopril-hydrochlorothiazide 10-12.5 MG tablet Commonly known  as: ZESTORETIC TAKE 1 TABLET DAILY   loratadine 10 MG tablet Commonly known as: CLARITIN TAKE 1 TABLET DAILY   terazosin 1 MG capsule Commonly known as: HYTRIN TAKE 1 CAPSULE AT BEDTIME   venlafaxine XR 75 MG 24 hr capsule Commonly known as: EFFEXOR-XR TAKE 1 CAPSULE DAILY WITH BREAKFAST        Allergies: No Known Allergies  Past Medical History, Surgical history, Social history, and Family History were reviewed and updated.  Review of Systems: All other 10 point review of systems is negative.   Physical Exam:  vitals were not taken for this visit.   Wt Readings from Last 3 Encounters:  07/12/22 170 lb (77.1 kg)  05/10/22 161 lb 6.4 oz (73.2 kg)  02/07/22 155 lb 6.4 oz (70.5 kg)    Ocular: Sclerae unicteric, pupils equal, round and reactive to light Ear-nose-throat: Oropharynx clear, dentition fair Lymphatic: No cervical or supraclavicular adenopathy Lungs no rales or rhonchi, good excursion bilaterally Heart regular rate and rhythm, no murmur appreciated Abd soft, nontender, positive bowel sounds MSK no focal spinal tenderness, no joint edema Neuro: non-focal, well-oriented, appropriate affect Breasts: Deferred   Lab Results  Component Value Date   WBC 3.6 (L) 07/12/2022   HGB 10.9 (L) 07/12/2022   HCT 34.0 (L) 07/12/2022   MCV 104.9 (H) 07/12/2022   PLT 103 (L) 07/12/2022   Lab Results  Component Value Date   FERRITIN 87 05/10/2022   IRON 39 (L) 05/10/2022   TIBC 329 05/10/2022   UIBC 290 05/10/2022   IRONPCTSAT 12 (L) 05/10/2022   Lab Results  Component Value Date   RETICCTPCT 1.4 07/12/2022   RBC 3.27 (L) 07/12/2022   No results found for: "KPAFRELGTCHN", "LAMBDASER", "KAPLAMBRATIO" No results found for: "IGGSERUM", "IGA", "IGMSERUM" No results found for: "TOTALPROTELP", "ALBUMINELP", "A1GS", "A2GS", "BETS", "BETA2SER", "GAMS", "MSPIKE", "SPEI"   Chemistry      Component Value Date/Time   NA 139 06/21/2022 1333   NA 144 02/26/2021 0000   K  4.5 06/21/2022 1333   CL 102 06/21/2022 1333   CO2 31 06/21/2022 1333   BUN 22 06/21/2022 1333   BUN 21 02/26/2021 0000   CREATININE 1.48 (H) 06/21/2022 1333   CREATININE 1.14 (H) 06/04/2020 1041   GLU 94 02/26/2021 0000      Component Value Date/Time   CALCIUM 10.2 06/21/2022 1333   ALKPHOS 61 06/21/2022 1333   AST 14 (L) 06/21/2022 1333   ALT 10 06/21/2022 1333   BILITOT 0.5 06/21/2022 1333       Impression and Plan: Andrew Ramos is a very pleasant 88 yo gentleman with CML and multifactorial anemia. ESA given, Hgb 10.9.  Iron studies are pending.  Lab and injection every 3 weeks, follow-up in 12 weeks.   Lottie Dawson, NP 10/24/20231:57 PM

## 2022-07-12 NOTE — Patient Instructions (Addendum)
It was a pleasure to see you today at our office.   Recommendations:  Meds: Follow up Mon 4/22 at 9:30  Continue donepezil 10 mg daily.     RECOMMENDATIONS FOR ALL PATIENTS WITH MEMORY PROBLEMS: 1. Continue to exercise (Recommend 30 minutes of walking everyday, or 3 hours every week) 2. Increase social interactions - continue going to [place] and enjoy social gatherings with friends and family 3. Eat healthy, avoid fried foods and eat more fruits and vegetables 4. Maintain adequate blood pressure, blood sugar, and blood cholesterol level. Reducing the risk of stroke and cardiovascular disease also helps promoting better memory. 5. Avoid stressful situations. Live a simple life and avoid aggravations. Organize your time and prepare for the next day in anticipation. 6. Sleep well, avoid any interruptions of sleep and avoid any distractions in the bedroom that may interfere with adequate sleep quality 7. Avoid sugar, avoid sweets as there is a strong link between excessive sugar intake, diabetes, and cognitive impairment We discussed the Mediterranean diet, which has been shown to help patients reduce the risk of progressive memory disorders and reduces cardiovascular risk. This includes eating fish, eat fruits and green leafy vegetables, nuts like almonds and hazelnuts, walnuts, and also use olive oil. Avoid fast foods and fried foods as much as possible. Avoid sweets and sugar as sugar use has been linked to worsening of memory function.  There is always a concern of gradual progression of memory problems. If this is the case, then we may need to adjust level of care according to patient needs. Support, both to the patient and caregiver, should then be put into place.      FALL PRECAUTIONS: Be cautious when walking. Scan the area for obstacles that may increase the risk of trips and falls. When getting up in the mornings, sit up at the edge of the bed for a few minutes before getting out of  bed. Consider elevating the bed at the head end to avoid drop of blood pressure when getting up. Walk always in a well-lit room (use night lights in the walls). Avoid area rugs or power cords from appliances in the middle of the walkways. Use a walker or a cane if necessary and consider physical therapy for balance exercise. Get your eyesight checked regularly.   HOME SAFETY: Consider the safety of the kitchen when operating appliances like stoves, microwave oven, and blender. Consider having supervision and share cooking responsibilities until no longer able to participate in those. Accidents with firearms and other hazards in the house should be identified and addressed as well.   DRIVING: Regarding driving, in patients with progressive memory problems, driving will be impaired. We advise to have someone else do the driving if trouble finding directions or if minor accidents are reported. Independent driving assessment is available to determine safety of driving.       Mediterranean Diet A Mediterranean diet refers to food and lifestyle choices that are based on the traditions of countries located on the [employer]. This way of eating has been shown to help prevent certain conditions and improve outcomes for people who have chronic diseases, like kidney disease and heart disease. What are tips for following this plan? Lifestyle  Cook and eat meals together with your family, when possible. Drink enough fluid to keep your urine clear or pale yellow. Be physically active every day. This includes: Aerobic exercise like running or swimming. Leisure activities like gardening, walking, or housework. Get 7-8 hours of sleep  each night. If recommended by your health care provider, drink red wine in moderation. This means 1 glass a day for nonpregnant women and 2 glasses a day for men. A glass of wine equals 5 oz (150 mL). Reading food labels  Check the serving size of packaged foods. For  foods such as rice and pasta, the serving size refers to the amount of cooked product, not dry. Check the total fat in packaged foods. Avoid foods that have saturated fat or trans fats. Check the ingredients list for added sugars, such as corn syrup. Shopping  At the grocery store, buy most of your food from the areas near the walls of the store. This includes: Fresh fruits and vegetables (produce). Grains, beans, nuts, and seeds. Some of these may be available in unpackaged forms or large amounts (in bulk). Fresh seafood. Poultry and eggs. Low-fat dairy products. Buy whole ingredients instead of prepackaged foods. Buy fresh fruits and vegetables in-season from local farmers markets. Buy frozen fruits and vegetables in resealable bags. If you do not have access to quality fresh seafood, buy precooked frozen shrimp or canned fish, such as tuna, salmon, or sardines. Buy small amounts of raw or cooked vegetables, salads, or olives from the deli or salad bar at your store. Stock your pantry so you always have certain foods on hand, such as olive oil, canned tuna, canned tomatoes, rice, pasta, and beans. Cooking  Cook foods with extra-virgin olive oil instead of using butter or other vegetable oils. Have meat as a side dish, and have vegetables or grains as your main dish. This means having meat in small portions or adding small amounts of meat to foods like pasta or stew. Use beans or vegetables instead of meat in common dishes like chili or lasagna. Experiment with different cooking methods. Try roasting or broiling vegetables instead of steaming or sauteing them. Add frozen vegetables to soups, stews, pasta, or rice. Add nuts or seeds for added healthy fat at each meal. You can add these to yogurt, salads, or vegetable dishes. Marinate fish or vegetables using olive oil, lemon juice, garlic, and fresh herbs. Meal planning  Plan to eat 1 vegetarian meal one day each week. Try to work up to 2  vegetarian meals, if possible. Eat seafood 2 or more times a week. Have healthy snacks readily available, such as: Vegetable sticks with hummus. Greek yogurt. Fruit and nut trail mix. Eat balanced meals throughout the week. This includes: Fruit: 2-3 servings a day Vegetables: 4-5 servings a day Low-fat dairy: 2 servings a day Fish, poultry, or lean meat: 1 serving a day Beans and legumes: 2 or more servings a week Nuts and seeds: 1-2 servings a day Whole grains: 6-8 servings a day Extra-virgin olive oil: 3-4 servings a day Limit red meat and sweets to only a few servings a month What are my food choices? Mediterranean diet Recommended Grains: Whole-grain pasta. Brown rice. Bulgar wheat. Polenta. Couscous. Whole-wheat bread. [place]. Vegetables: Artichokes. Beets. Broccoli. Cabbage. Carrots. Eggplant. Green beans. Chard. Kale. Spinach. Onions. Leeks. Peas. Squash. Tomatoes. Peppers. Radishes. Fruits: Apples. Apricots. Avocado. Berries. Bananas. Cherries. Dates. Figs. Grapes. Lemons. Melon. Oranges. Peaches. Plums. Pomegranate. Meats and other protein foods: Beans. Almonds. Sunflower seeds. Pine nuts. Peanuts. [place]. Salmon. Scallops. Shrimp. [place]. Tilapia. Clams. Oysters. Eggs. Dairy: Low-fat milk. Cheese. Greek yogurt. Beverages: Water. Red wine. Herbal tea. Fats and oils: Extra virgin olive oil. Avocado oil. Grape seed oil. Sweets and desserts: [place] yogurt with honey. Baked apples. Poached  pears. Trail mix. Seasoning and other foods: Basil. Cilantro. Coriander. Cumin. Mint. Parsley. Sage. Rosemary. Tarragon. Garlic. Oregano. Thyme. Pepper. Balsalmic vinegar. Tahini. Hummus. Tomato sauce. Olives. Mushrooms. Limit these Grains: Prepackaged pasta or rice dishes. Prepackaged cereal with added sugar. Vegetables: Deep fried potatoes (french fries). Fruits: Fruit canned in syrup. Meats and other protein foods: Beef. Pork. Lamb. Poultry with skin. Hot dogs. Berniece Salines. Dairy: Ice cream.  Sour cream. Whole milk. Beverages: Juice. Sugar-sweetened soft drinks. Beer. Liquor and spirits. Fats and oils: Butter. Canola oil. Vegetable oil. Beef fat (tallow). Lard. Sweets and desserts: Cookies. Cakes. Pies. Candy. Seasoning and other foods: Mayonnaise. Premade sauces and marinades. The items listed may not be a complete list. Talk with your dietitian about what dietary choices are right for you. Summary The Mediterranean diet includes both food and lifestyle choices. Eat a variety of fresh fruits and vegetables, beans, nuts, seeds, and whole grains. Limit the amount of red meat and sweets that you eat. Talk with your health care provider about whether it is safe for you to drink red wine in moderation. This means 1 glass a day for nonpregnant women and 2 glasses a day for men. A glass of wine equals 5 oz (150 mL). This information is not intended to replace advice given to you by your health care provider. Make sure you discuss any questions you have with your health care provider. Document Released: 04/28/2016 Document Revised: 05/31/2016 Document Reviewed: 04/28/2016 Elsevier Interactive Patient Education  2017 [employer].   .

## 2022-07-13 LAB — IRON AND IRON BINDING CAPACITY (CC-WL,HP ONLY)
Iron: 137 ug/dL (ref 45–182)
Saturation Ratios: 35 % (ref 17.9–39.5)
TIBC: 393 ug/dL (ref 250–450)
UIBC: 256 ug/dL (ref 117–376)

## 2022-07-18 DIAGNOSIS — M7062 Trochanteric bursitis, left hip: Secondary | ICD-10-CM | POA: Diagnosis not present

## 2022-07-20 DIAGNOSIS — L570 Actinic keratosis: Secondary | ICD-10-CM | POA: Diagnosis not present

## 2022-07-20 DIAGNOSIS — D225 Melanocytic nevi of trunk: Secondary | ICD-10-CM | POA: Diagnosis not present

## 2022-07-20 DIAGNOSIS — C434 Malignant melanoma of scalp and neck: Secondary | ICD-10-CM | POA: Diagnosis not present

## 2022-07-20 DIAGNOSIS — D485 Neoplasm of uncertain behavior of skin: Secondary | ICD-10-CM | POA: Diagnosis not present

## 2022-07-20 DIAGNOSIS — L821 Other seborrheic keratosis: Secondary | ICD-10-CM | POA: Diagnosis not present

## 2022-07-20 DIAGNOSIS — Z85828 Personal history of other malignant neoplasm of skin: Secondary | ICD-10-CM | POA: Diagnosis not present

## 2022-07-20 DIAGNOSIS — C44219 Basal cell carcinoma of skin of left ear and external auricular canal: Secondary | ICD-10-CM | POA: Diagnosis not present

## 2022-07-20 DIAGNOSIS — Z85820 Personal history of malignant melanoma of skin: Secondary | ICD-10-CM | POA: Diagnosis not present

## 2022-07-20 DIAGNOSIS — L578 Other skin changes due to chronic exposure to nonionizing radiation: Secondary | ICD-10-CM | POA: Diagnosis not present

## 2022-07-20 DIAGNOSIS — Z86018 Personal history of other benign neoplasm: Secondary | ICD-10-CM | POA: Diagnosis not present

## 2022-07-20 DIAGNOSIS — D224 Melanocytic nevi of scalp and neck: Secondary | ICD-10-CM | POA: Diagnosis not present

## 2022-07-28 ENCOUNTER — Emergency Department (HOSPITAL_BASED_OUTPATIENT_CLINIC_OR_DEPARTMENT_OTHER)
Admission: EM | Admit: 2022-07-28 | Discharge: 2022-07-28 | Disposition: A | Discharge status: Home / Self Care | Payer: Medicare Other | Attending: Emergency Medicine | Admitting: Emergency Medicine

## 2022-07-28 ENCOUNTER — Other Ambulatory Visit: Payer: Self-pay

## 2022-07-28 ENCOUNTER — Emergency Department (HOSPITAL_BASED_OUTPATIENT_CLINIC_OR_DEPARTMENT_OTHER): Payer: Medicare Other

## 2022-07-28 ENCOUNTER — Encounter (HOSPITAL_BASED_OUTPATIENT_CLINIC_OR_DEPARTMENT_OTHER): Payer: Self-pay | Admitting: *Deleted

## 2022-07-28 DIAGNOSIS — S0101XA Laceration without foreign body of scalp, initial encounter: Secondary | ICD-10-CM | POA: Diagnosis not present

## 2022-07-28 DIAGNOSIS — Z8546 Personal history of malignant neoplasm of prostate: Secondary | ICD-10-CM | POA: Diagnosis not present

## 2022-07-28 DIAGNOSIS — Z79899 Other long term (current) drug therapy: Secondary | ICD-10-CM | POA: Insufficient documentation

## 2022-07-28 DIAGNOSIS — W19XXXA Unspecified fall, initial encounter: Secondary | ICD-10-CM

## 2022-07-28 DIAGNOSIS — W01198A Fall on same level from slipping, tripping and stumbling with subsequent striking against other object, initial encounter: Secondary | ICD-10-CM | POA: Diagnosis not present

## 2022-07-28 DIAGNOSIS — Y92002 Bathroom of unspecified non-institutional (private) residence single-family (private) house as the place of occurrence of the external cause: Secondary | ICD-10-CM | POA: Insufficient documentation

## 2022-07-28 DIAGNOSIS — S0990XA Unspecified injury of head, initial encounter: Secondary | ICD-10-CM | POA: Diagnosis present

## 2022-07-28 DIAGNOSIS — I10 Essential (primary) hypertension: Secondary | ICD-10-CM | POA: Diagnosis not present

## 2022-07-28 DIAGNOSIS — Z85820 Personal history of malignant melanoma of skin: Secondary | ICD-10-CM | POA: Insufficient documentation

## 2022-07-28 DIAGNOSIS — Z23 Encounter for immunization: Secondary | ICD-10-CM | POA: Diagnosis not present

## 2022-07-28 DIAGNOSIS — S199XXA Unspecified injury of neck, initial encounter: Secondary | ICD-10-CM | POA: Diagnosis not present

## 2022-07-28 MED ORDER — TETANUS-DIPHTH-ACELL PERTUSSIS 5-2.5-18.5 LF-MCG/0.5 IM SUSY
0.5000 mL | PREFILLED_SYRINGE | Freq: Once | INTRAMUSCULAR | Status: AC
  Administered 2022-07-28: 0.5 mL via INTRAMUSCULAR
  Filled 2022-07-28: qty 0.5

## 2022-07-28 NOTE — ED Provider Notes (Signed)
  Physical Exam  BP (!) 142/78   Pulse 68   Temp 98 F (36.7 C) (Oral)   Resp 16   SpO2 99%   Physical Exam  Procedures  Procedures  ED Course / MDM   Clinical Course as of 07/28/22 0736  Thu Jul 28, 2022  0726 Mechanical fall no LOC. Not on AC. Had head lac repaired by previous attending with staples. CTH no ICH. Follow up CT C spine and dc. [VB]  0736 No evidence of traumatic injury on C spine. Patient safe for discharge and follow up with PCP and return precautions.  [VB]    Clinical Course User Index [VB] Elgie Congo, MD   Medical Decision Making Amount and/or Complexity of Data Reviewed Radiology: ordered.  Risk Prescription drug management.          Elgie Congo, MD 07/28/22 [phone]

## 2022-07-28 NOTE — ED Triage Notes (Signed)
Pt got up to go to bathroom and his leg gave out and he fell and struck his head on a cabinet.  Pt has lac to top of head.  Pt is not on any blood thinners, bleeding appears controlled, no LOC.  Pt is alert and oriented, HOH, accompanied by his son

## 2022-07-28 NOTE — ED Provider Notes (Signed)
[facility]ERGENCY DEPT Provider Note   CSN: [id number] Arrival date & time: 07/28/22  0531     History  Chief Complaint  Patient presents with   Willa Rough Shively is a 88 y.o. male.  The history is provided by the patient and a relative.  Fall  He has history of hypertension, hyperlipidemia, prostate cancer, melanoma, polymyalgia rheumatica, chronic myelomonocytic leukemia and comes in following a fall at home.  He got up to go to the bathroom and did not use his cane.  His legs gave out on him and he hit his head against a cabinet.  He suffered a laceration to his scalp.  He denies loss of consciousness, denies other injury.  Last tetanus immunization is unknown.   Home Medications Prior to Admission medications   Medication Sig Start Date End Date Taking? Authorizing Provider  atorvastatin (LIPITOR) 40 MG tablet TAKE 1 TABLET DAILY 02/11/22   Carollee Herter, Alferd Apa, DO  Calcium Carbonate-Vitamin D (CALCIUM 600 + D PO) Take 1 tablet by mouth every morning.     [provider]  Cholecalciferol (VITAMIN D3) 1000 UNITS tablet Take 1,000 Units by mouth every morning.    [provider]  docusate sodium (COLACE) 100 MG capsule Take 200 mg by mouth daily as needed for mild constipation.    [provider]  donepezil (ARICEPT) 10 MG tablet Take 1 tablet (10 mg total) by mouth at bedtime. 06/29/21   Rondel Jumbo, PA-C  fluticasone (FLONASE) 50 MCG/ACT nasal spray Place 2 sprays into both nostrils daily. 05/10/22   Ann Held, DO  hydroxychloroquine (PLAQUENIL) 200 MG tablet Take 1 tablet by mouth 2 (two) times daily.    [provider]  Iron, Ferrous Sulfate, 325 (65 Fe) MG TABS Take 325 mg by mouth daily. 02/23/21   Roma Schanz R, DO  lisinopril-hydrochlorothiazide (ZESTORETIC) 10-12.5 MG tablet TAKE 1 TABLET DAILY 05/03/22   Carollee Herter, Alferd Apa, DO  loratadine (CLARITIN) 10 MG tablet TAKE 1 TABLET DAILY  01/07/21   Carollee Herter, Alferd Apa, DO  Multiple Vitamins-Minerals (CENTRUM SILVER ULTRA MENS) TABS Take 1 tablet by mouth every evening.    [provider]  terazosin (HYTRIN) 1 MG capsule TAKE 1 CAPSULE AT BEDTIME 07/07/21   Carollee Herter, Alferd Apa, DO  venlafaxine XR (EFFEXOR-XR) 75 MG 24 hr capsule TAKE 1 CAPSULE DAILY WITH BREAKFAST 12/23/21   Ann Held, DO      Allergies    Patient has no known allergies.    Review of Systems   Review of Systems  All other systems reviewed and are negative.   Physical Exam Updated Vital Signs BP (!) 142/78   Pulse 68   Temp 98 F (36.7 C) (Oral)   Resp 16   SpO2 99%  Physical Exam Vitals and nursing note reviewed.  88 year old male, resting comfortably and in no acute distress. Vital signs are significant for borderline elevated blood pressure. Oxygen saturation is 99%, which is normal. Head is normocephalic.  Y-shaped laceration noted on the vertex of the scalp. PERRLA, EOMI. Neck is nontender without adenopathy or JVD. Back is nontender and there is no CVA tenderness. Lungs are clear without rales, wheezes, or rhonchi. Chest is nontender. Heart has regular rate and rhythm without murmur. Abdomen is soft, flat, nontender. Extremities have no cyanosis or edema, full range of motion is present. Skin is warm and dry without rash. Neurologic: Mental  status is normal, cranial nerves are intact, moves all extremities equally.  ED Results / Procedures / Treatments    Radiology CT Cervical Spine Wo Contrast  Result Date: 07/28/2022 CLINICAL DATA:  Neck trauma. EXAM: CT CERVICAL SPINE WITHOUT CONTRAST TECHNIQUE: Multidetector CT imaging of the cervical spine was performed without intravenous contrast. Multiplanar CT image reconstructions were also generated. RADIATION DOSE REDUCTION: This exam was performed according to the departmental dose-optimization program which includes automated exposure control, adjustment of the mA  and/or kV according to patient size and/or use of iterative reconstruction technique. COMPARISON:  None Available. FINDINGS: Alignment: Slight degenerative anterolisthesis is present at C3-4 and C7-T1. Minimal degenerative retrolisthesis is present at C4-5 and C5-6. Skull base and vertebrae: Advanced degenerative changes are present at C1-2. Craniocervical junction is normal. Vertebral body heights are maintained. No acute or healing fractures are present. Soft tissues and spinal canal: No prevertebral fluid or swelling. No visible canal hematoma. Disc levels: Uncovertebral and foraminal disease is greatest bilaterally at C4-5 and C5-6. Mild bilateral narrowing at C2-3 is due to facet hypertrophy. Asymmetric left-sided stenosis at C3-4 is due to uncovertebral and facet hypertrophy. Less severe narrowing at C6-7 is worse left than right. Upper chest: The lung apices are clear. Thoracic inlet is within normal limits. IMPRESSION: 1. No acute fracture or traumatic subluxation. 2. Multilevel degenerative changes of the cervical spine as described. Electronically Signed   By: San Morelle M.D.   On: 07/28/2022 07:31   CT Head Wo Contrast  Result Date: 07/28/2022 CLINICAL DATA:  Fall. Struck head on cabinet. Laceration to top of head. EXAM: CT HEAD WITHOUT CONTRAST TECHNIQUE: Contiguous axial images were obtained from the base of the skull through the vertex without intravenous contrast. RADIATION DOSE REDUCTION: This exam was performed according to the departmental dose-optimization program which includes automated exposure control, adjustment of the mA and/or kV according to patient size and/or use of iterative reconstruction technique. COMPARISON:  05/31/2017 FINDINGS: Brain: There is no evidence for acute hemorrhage, hydrocephalus, mass lesion, or abnormal extra-axial fluid collection. No definite CT evidence for acute infarction. Diffuse loss of parenchymal volume is consistent with atrophy. Patchy low  attenuation in the deep hemispheric and periventricular white matter is nonspecific, but likely reflects chronic microvascular ischemic demyelination. Vascular: No hyperdense vessel or unexpected calcification. Skull: No evidence for fracture. No worrisome lytic or sclerotic lesion. Sinuses/Orbits: The visualized paranasal sinuses and mastoid air cells are clear. Visualized portions of the globes and intraorbital fat are unremarkable. Other: None. IMPRESSION: 1. No acute intracranial abnormality. 2. Atrophy with chronic small vessel ischemic disease. Electronically Signed   By: Misty Stanley M.D.   On: 07/28/2022 06:56    Procedures .Marland KitchenLaceration Repair  Date/Time: 07/28/2022 7:18 AM  Performed by: Delora Fuel, MD Authorized by: Delora Fuel, MD   Consent:    Consent obtained:  Verbal   Consent given by:  Patient   Risks, benefits, and alternatives were discussed: yes     Risks discussed:  Infection, pain and poor wound healing   Alternatives discussed:  No treatment Universal protocol:    Procedure explained and questions answered to patient or proxy's satisfaction: yes     Relevant documents present and verified: yes     Test results available: yes     Imaging studies available: yes     Required blood products, implants, devices, and special equipment available: yes     Site/side marked: yes     Immediately prior to procedure, a time out  was called: yes     Patient identity confirmed:  Verbally with patient and arm band Anesthesia:    Anesthesia method:  None Laceration details:    Location:  Scalp   Scalp location:  Crown   Length (cm):  8   Depth (mm):  3 Pre-procedure details:    Preparation:  Patient was prepped and draped in usual sterile fashion and imaging obtained to evaluate for foreign bodies Exploration:    Limited defect created (wound extended): no     Hemostasis achieved with:  Direct pressure   Imaging obtained: x-ray     Imaging outcome: foreign body not noted      Wound exploration: entire depth of wound visualized     Wound extent: no foreign body and no underlying fracture     Contaminated: no   Treatment:    Area cleansed with:  Saline   Amount of cleaning:  Standard   Debridement:  None   Undermining:  None   Scar revision: no   Skin repair:    Repair method:  Staples   Number of staples:  14 Approximation:    Approximation:  Close Repair type:    Repair type:  Intermediate (Closure required alignment of the flaps) Post-procedure details:    Dressing:  Open (no dressing)   Procedure completion:  Tolerated well, no immediate complications     Medications Ordered in ED Medications  Tdap (BOOSTRIX) injection 0.5 mL (has no administration in time range)    ED Course/ Medical Decision Making/ A&P Clinical Course as of 07/28/22 2239  Thu Jul 28, 2022  0726 Mechanical fall no LOC. Not on AC. Had head lac repaired by previous attending with staples. CTH no ICH. Follow up CT C spine and dc. [VB]  0736 No evidence of traumatic injury on C spine. Patient safe for discharge and follow up with PCP and return precautions.  [VB]    Clinical Course User Index [VB] Elgie Congo, MD                           Medical Decision Making Amount and/or Complexity of Data Reviewed Radiology: ordered.  Risk Prescription drug management.   Fall with head injury and scalp laceration.  I have reviewed his old records, and last tetanus immunization recorded was on 09/07/2012.  I have ordered a Tdap booster.  I have ordered CT head and cervical spine to look for evidence of acute injury.  CT scans shows no intracranial injury, no fracture.  I have independently viewed the images, and agree with the radiologist's interpretation.  Laceration is closed with staples.  Patient is discharged with instructions to have staples removed in 7-10 days.  Final Clinical Impression(s) / ED Diagnoses Final diagnoses:  Fall at home, initial encounter  Scalp  laceration, initial encounter    Rx / DC Orders ED Discharge Orders     None         Delora Fuel, MD 97/67/34 2239

## 2022-07-28 NOTE — ED Notes (Signed)
Dc instructions reviewed with patient. Patient voiced understanding. Dc with belongings.  °

## 2022-07-28 NOTE — ED Notes (Signed)
Patient transported to CT 

## 2022-08-03 ENCOUNTER — Inpatient Hospital Stay: Payer: Medicare Other | Attending: Hematology & Oncology

## 2022-08-03 ENCOUNTER — Inpatient Hospital Stay: Payer: Medicare Other

## 2022-08-03 VITALS — BP 126/60 | HR 83 | Temp 97.8°F | Resp 18 | Wt 170.0 lb

## 2022-08-03 DIAGNOSIS — D4620 Refractory anemia with excess of blasts, unspecified: Secondary | ICD-10-CM

## 2022-08-03 DIAGNOSIS — C44212 Basal cell carcinoma of skin of right ear and external auricular canal: Secondary | ICD-10-CM | POA: Diagnosis not present

## 2022-08-03 DIAGNOSIS — C434 Malignant melanoma of scalp and neck: Secondary | ICD-10-CM | POA: Diagnosis not present

## 2022-08-03 DIAGNOSIS — Z79899 Other long term (current) drug therapy: Secondary | ICD-10-CM | POA: Diagnosis not present

## 2022-08-03 DIAGNOSIS — C9310 Chronic myelomonocytic leukemia not having achieved remission: Secondary | ICD-10-CM | POA: Insufficient documentation

## 2022-08-03 DIAGNOSIS — D509 Iron deficiency anemia, unspecified: Secondary | ICD-10-CM

## 2022-08-03 LAB — CBC WITH DIFFERENTIAL (CANCER CENTER ONLY)
Abs Immature Granulocytes: 0.02 10*3/uL (ref 0.00–0.07)
Basophils Absolute: 0 10*3/uL (ref 0.0–0.1)
Basophils Relative: 0 %
Eosinophils Absolute: 0 10*3/uL (ref 0.0–0.5)
Eosinophils Relative: 0 %
HCT: 31.9 % — ABNORMAL LOW (ref 39.0–52.0)
Hemoglobin: 10.2 g/dL — ABNORMAL LOW (ref 13.0–17.0)
Immature Granulocytes: 1 %
Lymphocytes Relative: 44 %
Lymphs Abs: 1.9 10*3/uL (ref 0.7–4.0)
MCH: 33.7 pg (ref 26.0–34.0)
MCHC: 32 g/dL (ref 30.0–36.0)
MCV: 105.3 fL — ABNORMAL HIGH (ref 80.0–100.0)
Monocytes Absolute: 2 10*3/uL — ABNORMAL HIGH (ref 0.1–1.0)
Monocytes Relative: 44 %
Neutro Abs: 0.5 10*3/uL — ABNORMAL LOW (ref 1.7–7.7)
Neutrophils Relative %: 11 %
Platelet Count: 102 10*3/uL — ABNORMAL LOW (ref 150–400)
RBC: 3.03 MIL/uL — ABNORMAL LOW (ref 4.22–5.81)
RDW: 15 % (ref 11.5–15.5)
WBC Count: 4.4 10*3/uL (ref 4.0–10.5)
nRBC: 0 % (ref 0.0–0.2)

## 2022-08-03 LAB — CMP (CANCER CENTER ONLY)
ALT: 8 U/L (ref 0–44)
AST: 11 U/L — ABNORMAL LOW (ref 15–41)
Albumin: 4.3 g/dL (ref 3.5–5.0)
Alkaline Phosphatase: 71 U/L (ref 38–126)
Anion gap: 6 (ref 5–15)
BUN: 27 mg/dL — ABNORMAL HIGH (ref 8–23)
CO2: 33 mmol/L — ABNORMAL HIGH (ref 22–32)
Calcium: 10.4 mg/dL — ABNORMAL HIGH (ref 8.9–10.3)
Chloride: 101 mmol/L (ref 98–111)
Creatinine: 1.39 mg/dL — ABNORMAL HIGH (ref 0.61–1.24)
GFR, Estimated: 49 mL/min — ABNORMAL LOW (ref >60)
Glucose, Bld: 87 mg/dL (ref 70–99)
Potassium: 4 mmol/L (ref 3.5–5.1)
Sodium: 140 mmol/L (ref 135–145)
Total Bilirubin: 0.5 mg/dL (ref 0.3–1.2)
Total Protein: 7.2 g/dL (ref 6.5–8.1)

## 2022-08-03 MED ORDER — DARBEPOETIN ALFA 300 MCG/0.6ML IJ SOSY
300.0000 ug | PREFILLED_SYRINGE | Freq: Once | INTRAMUSCULAR | Status: AC
  Administered 2022-08-03: 300 ug via SUBCUTANEOUS
  Filled 2022-08-03: qty 0.6

## 2022-08-03 NOTE — Patient Instructions (Signed)

## 2022-08-15 DIAGNOSIS — C434 Malignant melanoma of scalp and neck: Secondary | ICD-10-CM | POA: Diagnosis not present

## 2022-08-15 DIAGNOSIS — L859 Epidermal thickening, unspecified: Secondary | ICD-10-CM | POA: Diagnosis not present

## 2022-08-15 DIAGNOSIS — D224 Melanocytic nevi of scalp and neck: Secondary | ICD-10-CM | POA: Diagnosis not present

## 2022-08-15 DIAGNOSIS — L905 Scar conditions and fibrosis of skin: Secondary | ICD-10-CM | POA: Diagnosis not present

## 2022-08-24 ENCOUNTER — Inpatient Hospital Stay: Payer: Medicare Other

## 2022-08-24 ENCOUNTER — Inpatient Hospital Stay: Payer: Medicare Other | Attending: Hematology & Oncology

## 2022-08-24 VITALS — BP 128/67 | HR 77 | Temp 98.0°F | Resp 18

## 2022-08-24 DIAGNOSIS — C9310 Chronic myelomonocytic leukemia not having achieved remission: Secondary | ICD-10-CM | POA: Insufficient documentation

## 2022-08-24 DIAGNOSIS — H26493 Other secondary cataract, bilateral: Secondary | ICD-10-CM | POA: Diagnosis not present

## 2022-08-24 DIAGNOSIS — D509 Iron deficiency anemia, unspecified: Secondary | ICD-10-CM

## 2022-08-24 DIAGNOSIS — D4620 Refractory anemia with excess of blasts, unspecified: Secondary | ICD-10-CM

## 2022-08-24 DIAGNOSIS — Z79899 Other long term (current) drug therapy: Secondary | ICD-10-CM | POA: Insufficient documentation

## 2022-08-24 DIAGNOSIS — H35033 Hypertensive retinopathy, bilateral: Secondary | ICD-10-CM | POA: Diagnosis not present

## 2022-08-24 DIAGNOSIS — H18523 Epithelial (juvenile) corneal dystrophy, bilateral: Secondary | ICD-10-CM | POA: Diagnosis not present

## 2022-08-24 DIAGNOSIS — H52203 Unspecified astigmatism, bilateral: Secondary | ICD-10-CM | POA: Diagnosis not present

## 2022-08-24 DIAGNOSIS — H40003 Preglaucoma, unspecified, bilateral: Secondary | ICD-10-CM | POA: Diagnosis not present

## 2022-08-24 DIAGNOSIS — Z961 Presence of intraocular lens: Secondary | ICD-10-CM | POA: Diagnosis not present

## 2022-08-24 DIAGNOSIS — H04123 Dry eye syndrome of bilateral lacrimal glands: Secondary | ICD-10-CM | POA: Diagnosis not present

## 2022-08-24 DIAGNOSIS — H524 Presbyopia: Secondary | ICD-10-CM | POA: Diagnosis not present

## 2022-08-24 DIAGNOSIS — H5203 Hypermetropia, bilateral: Secondary | ICD-10-CM | POA: Diagnosis not present

## 2022-08-24 DIAGNOSIS — H35363 Drusen (degenerative) of macula, bilateral: Secondary | ICD-10-CM | POA: Diagnosis not present

## 2022-08-24 LAB — CBC WITH DIFFERENTIAL (CANCER CENTER ONLY)
Abs Immature Granulocytes: 0.04 10*3/uL (ref 0.00–0.07)
Basophils Absolute: 0 10*3/uL (ref 0.0–0.1)
Basophils Relative: 0 %
Eosinophils Absolute: 0 10*3/uL (ref 0.0–0.5)
Eosinophils Relative: 0 %
HCT: 34.4 % — ABNORMAL LOW (ref 39.0–52.0)
Hemoglobin: 10.6 g/dL — ABNORMAL LOW (ref 13.0–17.0)
Immature Granulocytes: 1 %
Lymphocytes Relative: 44 %
Lymphs Abs: 2.5 10*3/uL (ref 0.7–4.0)
MCH: 32.5 pg (ref 26.0–34.0)
MCHC: 30.8 g/dL (ref 30.0–36.0)
MCV: 105.5 fL — ABNORMAL HIGH (ref 80.0–100.0)
Monocytes Absolute: 2.5 10*3/uL — ABNORMAL HIGH (ref 0.1–1.0)
Monocytes Relative: 44 %
Neutro Abs: 0.6 10*3/uL — ABNORMAL LOW (ref 1.7–7.7)
Neutrophils Relative %: 11 %
Platelet Count: 109 10*3/uL — ABNORMAL LOW (ref 150–400)
RBC: 3.26 MIL/uL — ABNORMAL LOW (ref 4.22–5.81)
RDW: 15.4 % (ref 11.5–15.5)
WBC Count: 5.6 10*3/uL (ref 4.0–10.5)
nRBC: 0 % (ref 0.0–0.2)

## 2022-08-24 LAB — CMP (CANCER CENTER ONLY)
ALT: 7 U/L (ref 0–44)
AST: 14 U/L — ABNORMAL LOW (ref 15–41)
Albumin: 4.5 g/dL (ref 3.5–5.0)
Alkaline Phosphatase: 68 U/L (ref 38–126)
Anion gap: 7 (ref 5–15)
BUN: 25 mg/dL — ABNORMAL HIGH (ref 8–23)
CO2: 32 mmol/L (ref 22–32)
Calcium: 9.9 mg/dL (ref 8.9–10.3)
Chloride: 102 mmol/L (ref 98–111)
Creatinine: 1.37 mg/dL — ABNORMAL HIGH (ref 0.61–1.24)
GFR, Estimated: 50 mL/min — ABNORMAL LOW (ref >60)
Glucose, Bld: 112 mg/dL — ABNORMAL HIGH (ref 70–99)
Potassium: 4.9 mmol/L (ref 3.5–5.1)
Sodium: 141 mmol/L (ref 135–145)
Total Bilirubin: 0.5 mg/dL (ref 0.3–1.2)
Total Protein: 7.6 g/dL (ref 6.5–8.1)

## 2022-08-24 MED ORDER — DARBEPOETIN ALFA 300 MCG/0.6ML IJ SOSY
300.0000 ug | PREFILLED_SYRINGE | Freq: Once | INTRAMUSCULAR | Status: AC
  Administered 2022-08-24: 300 ug via SUBCUTANEOUS
  Filled 2022-08-24: qty 0.6

## 2022-08-24 NOTE — Patient Instructions (Signed)

## 2022-08-30 DIAGNOSIS — H26492 Other secondary cataract, left eye: Secondary | ICD-10-CM | POA: Diagnosis not present

## 2022-09-05 DIAGNOSIS — H26491 Other secondary cataract, right eye: Secondary | ICD-10-CM | POA: Diagnosis not present

## 2022-09-06 ENCOUNTER — Other Ambulatory Visit: Payer: Self-pay | Admitting: Physician Assistant

## 2022-09-06 ENCOUNTER — Other Ambulatory Visit: Payer: Self-pay | Admitting: Family Medicine

## 2022-09-14 ENCOUNTER — Inpatient Hospital Stay: Payer: Medicare Other

## 2022-09-14 DIAGNOSIS — C9310 Chronic myelomonocytic leukemia not having achieved remission: Secondary | ICD-10-CM | POA: Diagnosis not present

## 2022-09-14 DIAGNOSIS — D509 Iron deficiency anemia, unspecified: Secondary | ICD-10-CM

## 2022-09-14 DIAGNOSIS — Z79899 Other long term (current) drug therapy: Secondary | ICD-10-CM | POA: Diagnosis not present

## 2022-09-14 DIAGNOSIS — D4620 Refractory anemia with excess of blasts, unspecified: Secondary | ICD-10-CM | POA: Diagnosis not present

## 2022-09-14 LAB — CMP (CANCER CENTER ONLY)
ALT: 8 U/L (ref 0–44)
AST: 12 U/L — ABNORMAL LOW (ref 15–41)
Albumin: 4.5 g/dL (ref 3.5–5.0)
Alkaline Phosphatase: 65 U/L (ref 38–126)
Anion gap: 8 (ref 5–15)
BUN: 25 mg/dL — ABNORMAL HIGH (ref 8–23)
CO2: 31 mmol/L (ref 22–32)
Calcium: 9.6 mg/dL (ref 8.9–10.3)
Chloride: 100 mmol/L (ref 98–111)
Creatinine: 1.4 mg/dL — ABNORMAL HIGH (ref 0.61–1.24)
GFR, Estimated: 48 mL/min — ABNORMAL LOW (ref >60)
Glucose, Bld: 112 mg/dL — ABNORMAL HIGH (ref 70–99)
Potassium: 3.9 mmol/L (ref 3.5–5.1)
Sodium: 139 mmol/L (ref 135–145)
Total Bilirubin: 0.6 mg/dL (ref 0.3–1.2)
Total Protein: 7.3 g/dL (ref 6.5–8.1)

## 2022-09-14 LAB — CBC WITH DIFFERENTIAL (CANCER CENTER ONLY)
Abs Immature Granulocytes: 0.02 10*3/uL (ref 0.00–0.07)
Basophils Absolute: 0 10*3/uL (ref 0.0–0.1)
Basophils Relative: 0 %
Eosinophils Absolute: 0 10*3/uL (ref 0.0–0.5)
Eosinophils Relative: 0 %
HCT: 34.9 % — ABNORMAL LOW (ref 39.0–52.0)
Hemoglobin: 11.2 g/dL — ABNORMAL LOW (ref 13.0–17.0)
Immature Granulocytes: 0 %
Lymphocytes Relative: 49 %
Lymphs Abs: 2.7 10*3/uL (ref 0.7–4.0)
MCH: 33 pg (ref 26.0–34.0)
MCHC: 32.1 g/dL (ref 30.0–36.0)
MCV: 102.9 fL — ABNORMAL HIGH (ref 80.0–100.0)
Monocytes Absolute: 2.4 10*3/uL — ABNORMAL HIGH (ref 0.1–1.0)
Monocytes Relative: 42 %
Neutro Abs: 0.5 10*3/uL — ABNORMAL LOW (ref 1.7–7.7)
Neutrophils Relative %: 9 %
Platelet Count: 115 10*3/uL — ABNORMAL LOW (ref 150–400)
RBC: 3.39 MIL/uL — ABNORMAL LOW (ref 4.22–5.81)
RDW: 16 % — ABNORMAL HIGH (ref 11.5–15.5)
WBC Count: 5.6 10*3/uL (ref 4.0–10.5)
nRBC: 0 % (ref 0.0–0.2)

## 2022-09-20 DIAGNOSIS — C44219 Basal cell carcinoma of skin of left ear and external auricular canal: Secondary | ICD-10-CM | POA: Diagnosis not present

## 2022-10-05 ENCOUNTER — Inpatient Hospital Stay: Payer: Medicare Other

## 2022-10-05 ENCOUNTER — Encounter: Payer: Self-pay | Admitting: Family

## 2022-10-05 ENCOUNTER — Inpatient Hospital Stay: Payer: Medicare Other | Attending: Hematology & Oncology | Admitting: Family

## 2022-10-05 VITALS — BP 139/73 | HR 60 | Temp 97.6°F | Resp 18 | Wt 172.0 lb

## 2022-10-05 DIAGNOSIS — D4620 Refractory anemia with excess of blasts, unspecified: Secondary | ICD-10-CM | POA: Diagnosis not present

## 2022-10-05 DIAGNOSIS — D509 Iron deficiency anemia, unspecified: Secondary | ICD-10-CM

## 2022-10-05 DIAGNOSIS — Z79899 Other long term (current) drug therapy: Secondary | ICD-10-CM | POA: Diagnosis not present

## 2022-10-05 DIAGNOSIS — C9310 Chronic myelomonocytic leukemia not having achieved remission: Secondary | ICD-10-CM | POA: Diagnosis not present

## 2022-10-05 DIAGNOSIS — C9311 Chronic myelomonocytic leukemia, in remission: Secondary | ICD-10-CM | POA: Diagnosis not present

## 2022-10-05 LAB — CMP (CANCER CENTER ONLY)
ALT: 8 U/L (ref 0–44)
AST: 11 U/L — ABNORMAL LOW (ref 15–41)
Albumin: 4.4 g/dL (ref 3.5–5.0)
Alkaline Phosphatase: 61 U/L (ref 38–126)
Anion gap: 8 (ref 5–15)
BUN: 25 mg/dL — ABNORMAL HIGH (ref 8–23)
CO2: 32 mmol/L (ref 22–32)
Calcium: 10.4 mg/dL — ABNORMAL HIGH (ref 8.9–10.3)
Chloride: 101 mmol/L (ref 98–111)
Creatinine: 1.35 mg/dL — ABNORMAL HIGH (ref 0.61–1.24)
GFR, Estimated: 50 mL/min — ABNORMAL LOW (ref >60)
Glucose, Bld: 116 mg/dL — ABNORMAL HIGH (ref 70–99)
Potassium: 4.3 mmol/L (ref 3.5–5.1)
Sodium: 141 mmol/L (ref 135–145)
Total Bilirubin: 0.5 mg/dL (ref 0.3–1.2)
Total Protein: 7.7 g/dL (ref 6.5–8.1)

## 2022-10-05 LAB — CBC WITH DIFFERENTIAL (CANCER CENTER ONLY)
Abs Immature Granulocytes: 0.07 10*3/uL (ref 0.00–0.07)
Basophils Absolute: 0 10*3/uL (ref 0.0–0.1)
Basophils Relative: 0 %
Eosinophils Absolute: 0 10*3/uL (ref 0.0–0.5)
Eosinophils Relative: 0 %
HCT: 32 % — ABNORMAL LOW (ref 39.0–52.0)
Hemoglobin: 10.2 g/dL — ABNORMAL LOW (ref 13.0–17.0)
Immature Granulocytes: 1 %
Lymphocytes Relative: 43 %
Lymphs Abs: 3 10*3/uL (ref 0.7–4.0)
MCH: 33.1 pg (ref 26.0–34.0)
MCHC: 31.9 g/dL (ref 30.0–36.0)
MCV: 103.9 fL — ABNORMAL HIGH (ref 80.0–100.0)
Monocytes Absolute: 2.7 10*3/uL — ABNORMAL HIGH (ref 0.1–1.0)
Monocytes Relative: 38 %
Neutro Abs: 1.3 10*3/uL — ABNORMAL LOW (ref 1.7–7.7)
Neutrophils Relative %: 18 %
Platelet Count: 150 10*3/uL (ref 150–400)
RBC: 3.08 MIL/uL — ABNORMAL LOW (ref 4.22–5.81)
RDW: 16.3 % — ABNORMAL HIGH (ref 11.5–15.5)
WBC Count: 7 10*3/uL (ref 4.0–10.5)
nRBC: 0 % (ref 0.0–0.2)

## 2022-10-05 LAB — FERRITIN: Ferritin: 105 ng/mL (ref 24–336)

## 2022-10-05 LAB — IRON AND IRON BINDING CAPACITY (CC-WL,HP ONLY)
Iron: 128 ug/dL (ref 45–182)
Saturation Ratios: 34 % (ref 17.9–39.5)
TIBC: 377 ug/dL (ref 250–450)
UIBC: 249 ug/dL (ref 117–376)

## 2022-10-05 MED ORDER — DARBEPOETIN ALFA 300 MCG/0.6ML IJ SOSY
300.0000 ug | PREFILLED_SYRINGE | Freq: Once | INTRAMUSCULAR | Status: AC
  Administered 2022-10-05: 300 ug via SUBCUTANEOUS
  Filled 2022-10-05: qty 0.6

## 2022-10-05 NOTE — Patient Instructions (Signed)

## 2022-10-05 NOTE — Progress Notes (Signed)
Hematology and Oncology Follow Up Visit  Andrew Ramos [id number] [birth year] 89 y.o. 10/05/2022   Principle Diagnosis:  Chronic myelomonocytic leukemia --intermediate 2  risk   Current Therapy:        Aranesp 300 mcg sq q 3 week for Hgb <11 IV Iron as indicated    Interim History:  Andrew Ramos is here today for follow-up. He is doing well and has no complaints at this time.  He is enjoying living in the assisted living facility with his wife. The food is quite good there.  No blood loss noted. No bruising or petechiae.  No fever, chills, n/v, cough, rash, dizziness, SOB, chest pain, palpitations, abdominal pain or changes in bowel or bladder habits.  No swelling, tenderness, numbness or tingling in his extremities.  No falls or syncope.  Appetite and hydration have been good. Weight is stable at 172 lbs.   ECOG Performance Status: 1 - Symptomatic but completely ambulatory  Medications:  Allergies as of 10/05/2022   No Known Allergies      Medication List        Accurate as of October 05, 2022 10:20 AM. If you have any questions, ask your nurse or doctor.          atorvastatin 40 MG tablet Commonly known as: LIPITOR TAKE 1 TABLET DAILY   CALCIUM 600 + D PO Take 1 tablet by mouth every morning.   Centrum Silver Ultra Mens Tabs Take 1 tablet by mouth every evening.   cholecalciferol 25 MCG (1000 UNIT) tablet Commonly known as: VITAMIN D3 Take 1,000 Units by mouth every morning.   docusate sodium 100 MG capsule Commonly known as: COLACE Take 200 mg by mouth daily as needed for mild constipation.   donepezil 10 MG tablet Commonly known as: ARICEPT TAKE 1 TABLET AT BEDTIME   fluticasone 50 MCG/ACT nasal spray Commonly known as: FLONASE Place 2 sprays into both nostrils daily.   hydroxychloroquine 200 MG tablet Commonly known as: PLAQUENIL Take 1 tablet by mouth 2 (two) times daily.   Iron (Ferrous Sulfate) 325 (65 Fe) MG Tabs Take 325 mg by mouth  daily.   lisinopril-hydrochlorothiazide 10-12.5 MG tablet Commonly known as: ZESTORETIC TAKE 1 TABLET DAILY   loratadine 10 MG tablet Commonly known as: CLARITIN TAKE 1 TABLET DAILY   terazosin 1 MG capsule Commonly known as: HYTRIN TAKE 1 CAPSULE AT BEDTIME   venlafaxine XR 75 MG 24 hr capsule Commonly known as: EFFEXOR-XR TAKE 1 CAPSULE DAILY WITH BREAKFAST        Allergies: No Known Allergies  Past Medical History, Surgical history, Social history, and Family History were reviewed and updated.  Review of Systems: All other 10 point review of systems is negative.   Physical Exam:  vitals were not taken for this visit.   Wt Readings from Last 3 Encounters:  08/03/22 170 lb (77.1 kg)  07/12/22 171 lb 1.9 oz (77.6 kg)  07/12/22 170 lb (77.1 kg)    Ocular: Sclerae unicteric, pupils equal, round and reactive to light Ear-nose-throat: Oropharynx clear, dentition fair Lymphatic: No cervical or supraclavicular adenopathy Lungs no rales or rhonchi, good excursion bilaterally Heart regular rate and rhythm, no murmur appreciated Abd soft, nontender, positive bowel sounds MSK no focal spinal tenderness, no joint edema Neuro: non-focal, well-oriented, appropriate affect Breasts: Deferred   Lab Results  Component Value Date   WBC 5.6 09/14/2022   HGB 11.2 (L) 09/14/2022   HCT 34.9 (L) 09/14/2022   MCV 102.9 (H)  09/14/2022   PLT 115 (L) 09/14/2022   Lab Results  Component Value Date   FERRITIN 55 07/12/2022   IRON 137 07/12/2022   TIBC 393 07/12/2022   UIBC 256 07/12/2022   IRONPCTSAT 35 07/12/2022   Lab Results  Component Value Date   RETICCTPCT 1.4 07/12/2022   RBC 3.39 (L) 09/14/2022   No results found for: "KPAFRELGTCHN", "LAMBDASER", "KAPLAMBRATIO" No results found for: "IGGSERUM", "IGA", "IGMSERUM" No results found for: "TOTALPROTELP", "ALBUMINELP", "A1GS", "A2GS", "BETS", "BETA2SER", "GAMS", "MSPIKE", "SPEI"   Chemistry      Component Value  Date/Time   NA 139 09/14/2022 0924   NA 144 02/26/2021 0000   K 3.9 09/14/2022 0924   CL 100 09/14/2022 0924   CO2 31 09/14/2022 0924   BUN 25 (H) 09/14/2022 0924   BUN 21 02/26/2021 0000   CREATININE 1.40 (H) 09/14/2022 0924   CREATININE 1.14 (H) 06/04/2020 1041   GLU 94 02/26/2021 0000      Component Value Date/Time   CALCIUM 9.6 09/14/2022 0924   ALKPHOS 65 09/14/2022 0924   AST 12 (L) 09/14/2022 0924   ALT 8 09/14/2022 0924   BILITOT 0.6 09/14/2022 0924       Impression and Plan: Andrew Ramos is a very pleasant 89 yo gentleman with CML and multifactorial anemia. ESA given, 10.2.  Iron studies are pending.  Lab and injection every 3 weeks, follow-up in 12 weeks.   Lottie Dawson, NP 1/17/202410:20 AM

## 2022-10-26 ENCOUNTER — Inpatient Hospital Stay: Payer: Medicare Other | Attending: Hematology & Oncology

## 2022-10-26 ENCOUNTER — Inpatient Hospital Stay: Payer: Medicare Other

## 2022-11-16 ENCOUNTER — Inpatient Hospital Stay: Payer: Medicare Other

## 2022-12-07 ENCOUNTER — Inpatient Hospital Stay: Payer: Medicare Other | Attending: Hematology & Oncology

## 2022-12-07 ENCOUNTER — Inpatient Hospital Stay: Payer: Medicare Other

## 2022-12-07 VITALS — BP 146/69 | HR 93 | Temp 97.9°F | Resp 18

## 2022-12-07 DIAGNOSIS — C9310 Chronic myelomonocytic leukemia not having achieved remission: Secondary | ICD-10-CM

## 2022-12-07 DIAGNOSIS — Z79899 Other long term (current) drug therapy: Secondary | ICD-10-CM | POA: Diagnosis not present

## 2022-12-07 DIAGNOSIS — D4620 Refractory anemia with excess of blasts, unspecified: Secondary | ICD-10-CM

## 2022-12-07 DIAGNOSIS — D509 Iron deficiency anemia, unspecified: Secondary | ICD-10-CM

## 2022-12-07 LAB — CBC WITH DIFFERENTIAL (CANCER CENTER ONLY)
Abs Immature Granulocytes: 0.02 10*3/uL (ref 0.00–0.07)
Basophils Absolute: 0 10*3/uL (ref 0.0–0.1)
Basophils Relative: 0 %
Eosinophils Absolute: 0 10*3/uL (ref 0.0–0.5)
Eosinophils Relative: 0 %
HCT: 30.4 % — ABNORMAL LOW (ref 39.0–52.0)
Hemoglobin: 10.2 g/dL — ABNORMAL LOW (ref 13.0–17.0)
Immature Granulocytes: 0 %
Lymphocytes Relative: 47 %
Lymphs Abs: 2.6 10*3/uL (ref 0.7–4.0)
MCH: 35.9 pg — ABNORMAL HIGH (ref 26.0–34.0)
MCHC: 33.6 g/dL (ref 30.0–36.0)
MCV: 107 fL — ABNORMAL HIGH (ref 80.0–100.0)
Monocytes Absolute: 2.1 10*3/uL — ABNORMAL HIGH (ref 0.1–1.0)
Monocytes Relative: 38 %
Neutro Abs: 0.8 10*3/uL — ABNORMAL LOW (ref 1.7–7.7)
Neutrophils Relative %: 15 %
Platelet Count: 125 10*3/uL — ABNORMAL LOW (ref 150–400)
RBC: 2.84 MIL/uL — ABNORMAL LOW (ref 4.22–5.81)
RDW: 15.7 % — ABNORMAL HIGH (ref 11.5–15.5)
WBC Count: 5.6 10*3/uL (ref 4.0–10.5)
nRBC: 0 % (ref 0.0–0.2)

## 2022-12-07 LAB — CMP (CANCER CENTER ONLY)
ALT: 10 U/L (ref 0–44)
AST: 15 U/L (ref 15–41)
Albumin: 4.6 g/dL (ref 3.5–5.0)
Alkaline Phosphatase: 56 U/L (ref 38–126)
Anion gap: 10 (ref 5–15)
BUN: 20 mg/dL (ref 8–23)
CO2: 30 mmol/L (ref 22–32)
Calcium: 10.5 mg/dL — ABNORMAL HIGH (ref 8.9–10.3)
Chloride: 102 mmol/L (ref 98–111)
Creatinine: 1.44 mg/dL — ABNORMAL HIGH (ref 0.61–1.24)
GFR, Estimated: 47 mL/min — ABNORMAL LOW (ref >60)
Glucose, Bld: 113 mg/dL — ABNORMAL HIGH (ref 70–99)
Potassium: 4.1 mmol/L (ref 3.5–5.1)
Sodium: 142 mmol/L (ref 135–145)
Total Bilirubin: 0.5 mg/dL (ref 0.3–1.2)
Total Protein: 7.5 g/dL (ref 6.5–8.1)

## 2022-12-07 MED ORDER — DARBEPOETIN ALFA 300 MCG/0.6ML IJ SOSY
300.0000 ug | PREFILLED_SYRINGE | Freq: Once | INTRAMUSCULAR | Status: AC
  Administered 2022-12-07: 300 ug via SUBCUTANEOUS
  Filled 2022-12-07: qty 0.6

## 2022-12-07 NOTE — Patient Instructions (Signed)

## 2023-01-04 ENCOUNTER — Inpatient Hospital Stay: Payer: Medicare Other

## 2023-01-04 ENCOUNTER — Inpatient Hospital Stay: Payer: Medicare Other | Attending: Hematology & Oncology | Admitting: Family

## 2023-01-04 ENCOUNTER — Telehealth: Payer: Self-pay | Admitting: *Deleted

## 2023-01-04 VITALS — BP 148/89 | HR 87 | Temp 97.9°F | Resp 17 | Ht 71.0 in | Wt 169.1 lb

## 2023-01-04 DIAGNOSIS — C9310 Chronic myelomonocytic leukemia not having achieved remission: Secondary | ICD-10-CM | POA: Diagnosis not present

## 2023-01-04 DIAGNOSIS — D4620 Refractory anemia with excess of blasts, unspecified: Secondary | ICD-10-CM

## 2023-01-04 DIAGNOSIS — D509 Iron deficiency anemia, unspecified: Secondary | ICD-10-CM

## 2023-01-04 DIAGNOSIS — Z79899 Other long term (current) drug therapy: Secondary | ICD-10-CM | POA: Insufficient documentation

## 2023-01-04 LAB — CBC WITH DIFFERENTIAL (CANCER CENTER ONLY)
Abs Immature Granulocytes: 0.01 10*3/uL (ref 0.00–0.07)
Basophils Absolute: 0 10*3/uL (ref 0.0–0.1)
Basophils Relative: 0 %
Eosinophils Absolute: 0 10*3/uL (ref 0.0–0.5)
Eosinophils Relative: 0 %
HCT: 32.8 % — ABNORMAL LOW (ref 39.0–52.0)
Hemoglobin: 10.9 g/dL — ABNORMAL LOW (ref 13.0–17.0)
Immature Granulocytes: 0 %
Lymphocytes Relative: 50 %
Lymphs Abs: 2.9 10*3/uL (ref 0.7–4.0)
MCH: 35.7 pg — ABNORMAL HIGH (ref 26.0–34.0)
MCHC: 33.2 g/dL (ref 30.0–36.0)
MCV: 107.5 fL — ABNORMAL HIGH (ref 80.0–100.0)
Monocytes Absolute: 2.5 10*3/uL — ABNORMAL HIGH (ref 0.1–1.0)
Monocytes Relative: 43 %
Neutro Abs: 0.4 10*3/uL — CL (ref 1.7–7.7)
Neutrophils Relative %: 7 %
Platelet Count: 108 10*3/uL — ABNORMAL LOW (ref 150–400)
RBC: 3.05 MIL/uL — ABNORMAL LOW (ref 4.22–5.81)
RDW: 14.6 % (ref 11.5–15.5)
Smear Review: NORMAL
WBC Count: 5.9 10*3/uL (ref 4.0–10.5)
nRBC: 0 % (ref 0.0–0.2)

## 2023-01-04 LAB — IRON AND IRON BINDING CAPACITY (CC-WL,HP ONLY)
Iron: 118 ug/dL (ref 45–182)
Saturation Ratios: 32 % (ref 17.9–39.5)
TIBC: 368 ug/dL (ref 250–450)
UIBC: 250 ug/dL (ref 117–376)

## 2023-01-04 LAB — CMP (CANCER CENTER ONLY)
ALT: 10 U/L (ref 0–44)
AST: 13 U/L — ABNORMAL LOW (ref 15–41)
Albumin: 4.3 g/dL (ref 3.5–5.0)
Alkaline Phosphatase: 61 U/L (ref 38–126)
Anion gap: 10 (ref 5–15)
BUN: 31 mg/dL — ABNORMAL HIGH (ref 8–23)
CO2: 29 mmol/L (ref 22–32)
Calcium: 10.3 mg/dL (ref 8.9–10.3)
Chloride: 103 mmol/L (ref 98–111)
Creatinine: 1.49 mg/dL — ABNORMAL HIGH (ref 0.61–1.24)
GFR, Estimated: 45 mL/min — ABNORMAL LOW (ref >60)
Glucose, Bld: 100 mg/dL — ABNORMAL HIGH (ref 70–99)
Potassium: 4 mmol/L (ref 3.5–5.1)
Sodium: 142 mmol/L (ref 135–145)
Total Bilirubin: 0.5 mg/dL (ref 0.3–1.2)
Total Protein: 7.4 g/dL (ref 6.5–8.1)

## 2023-01-04 LAB — FERRITIN: Ferritin: 77 ng/mL (ref 24–336)

## 2023-01-04 MED ORDER — DARBEPOETIN ALFA 300 MCG/0.6ML IJ SOSY
300.0000 ug | PREFILLED_SYRINGE | Freq: Once | INTRAMUSCULAR | Status: AC
  Administered 2023-01-04: 300 ug via SUBCUTANEOUS
  Filled 2023-01-04: qty 0.6

## 2023-01-04 NOTE — Progress Notes (Signed)
Hematology and Oncology Follow Up Visit  Andrew Ramos [id number] 12-Feb-1932 89 y.o. 01/04/2023   Principle Diagnosis:  Chronic myelomonocytic leukemia --intermediate 2  risk   Current Therapy:        Aranesp 300 mcg sq q 3 week for Hgb <11 IV Iron as indicated    Interim History:  Andrew Ramos is here today for follow-up and injection. Hgb today is 10.9, MCV 107. He is doing well and has no complaints at this time.  He has had no issue with blood loss. No abnormal bruising, no petechiae.  No fever, chills, n/v, cough, rash, dizziness, SOB, chest pain, palpitations, abdominal pain or changes in bowel or bladder habits.  No swelling, tenderness, numbness or tingling in his extremities.  No falls or syncope.  Appetite and hydration are good. Weight is stable at 169 lbs.   ECOG Performance Status: 1 - Symptomatic but completely ambulatory  Medications:  Allergies as of 01/04/2023   No Known Allergies      Medication List        Accurate as of January 04, 2023 10:44 AM. If you have any questions, ask your nurse or doctor.          atorvastatin 40 MG tablet Commonly known as: LIPITOR TAKE 1 TABLET DAILY   CALCIUM 600 + D PO Take 1 tablet by mouth every morning.   Centrum Silver Ultra Mens Tabs Take 1 tablet by mouth every evening.   cholecalciferol 25 MCG (1000 UNIT) tablet Commonly known as: VITAMIN D3 Take 1,000 Units by mouth every morning.   docusate sodium 100 MG capsule Commonly known as: COLACE Take 200 mg by mouth daily as needed for mild constipation.   donepezil 10 MG tablet Commonly known as: ARICEPT TAKE 1 TABLET AT BEDTIME   fluticasone 50 MCG/ACT nasal spray Commonly known as: FLONASE Place 2 sprays into both nostrils daily.   hydroxychloroquine 200 MG tablet Commonly known as: PLAQUENIL Take 1 tablet by mouth 2 (two) times daily.   Iron (Ferrous Sulfate) 325 (65 Fe) MG Tabs Take 325 mg by mouth daily.   lisinopril-hydrochlorothiazide  10-12.5 MG tablet Commonly known as: ZESTORETIC TAKE 1 TABLET DAILY   loratadine 10 MG tablet Commonly known as: CLARITIN TAKE 1 TABLET DAILY   terazosin 1 MG capsule Commonly known as: HYTRIN TAKE 1 CAPSULE AT BEDTIME   venlafaxine XR 75 MG 24 hr capsule Commonly known as: EFFEXOR-XR TAKE 1 CAPSULE DAILY WITH BREAKFAST        Allergies: No Known Allergies  Past Medical History, Surgical history, Social history, and Family History were reviewed and updated.  Review of Systems: All other 10 point review of systems is negative.   Physical Exam:  height is  (1.803 m) and weight is 169 lb 1.3 oz (76.7 kg). His oral temperature is 97.9 F (36.6 C). His blood pressure is 148/89 (abnormal) and his pulse is 87. His respiration is 17 and oxygen saturation is 99%.   Wt Readings from Last 3 Encounters:  01/04/23 169 lb 1.3 oz (76.7 kg)  10/05/22 172 lb (78 kg)  08/03/22 170 lb (77.1 kg)    Ocular: Sclerae unicteric, pupils equal, round and reactive to light Ear-nose-throat: Oropharynx clear, dentition fair Lymphatic: No cervical or supraclavicular adenopathy Lungs no rales or rhonchi, good excursion bilaterally Heart regular rate and rhythm, no murmur appreciated Abd soft, nontender, positive bowel sounds MSK no focal spinal tenderness, no joint edema Neuro: non-focal, well-oriented, appropriate affect Breasts: Deferred  Lab Results  Component Value Date   WBC 5.9 01/04/2023   HGB 10.9 (L) 01/04/2023   HCT 32.8 (L) 01/04/2023   MCV 107.5 (H) 01/04/2023   PLT 108 (L) 01/04/2023   Lab Results  Component Value Date   FERRITIN 105 10/05/2022   IRON 128 10/05/2022   TIBC 377 10/05/2022   UIBC 249 10/05/2022   IRONPCTSAT 34 10/05/2022   Lab Results  Component Value Date   RETICCTPCT 1.4 07/12/2022   RBC 3.05 (L) 01/04/2023   No results found for: "KPAFRELGTCHN", "LAMBDASER", "KAPLAMBRATIO" No results found for: "IGGSERUM", "IGA", "IGMSERUM" No results found  for: "TOTALPROTELP", "ALBUMINELP", "A1GS", "A2GS", "BETS", "BETA2SER", "GAMS", "MSPIKE", "SPEI"   Chemistry      Component Value Date/Time   NA 142 12/07/2022 1021   NA 144 02/26/2021 0000   K 4.1 12/07/2022 1021   CL 102 12/07/2022 1021   CO2 30 12/07/2022 1021   BUN 20 12/07/2022 1021   BUN 21 02/26/2021 0000   CREATININE 1.44 (H) 12/07/2022 1021   CREATININE 1.14 (H) 06/04/2020 1041   GLU 94 02/26/2021 0000      Component Value Date/Time   CALCIUM 10.5 (H) 12/07/2022 1021   ALKPHOS 56 12/07/2022 1021   AST 15 12/07/2022 1021   ALT 10 12/07/2022 1021   BILITOT 0.5 12/07/2022 1021       Impression and Plan: Andrew Ramos is a very pleasant 89 yo gentleman with CML and multifactorial anemia. ESA given, Hgb 10.9.  Iron studies are pending.  Lab and injection every 3 weeks, follow-up in 3 months.   Eileen Stanford, NP 4/17/202410:44 AM

## 2023-01-04 NOTE — Telephone Encounter (Signed)
Received critical ANC of .4 by Gery Pray in lab.  Eileen Stanford NP notified.  No orders received

## 2023-01-04 NOTE — Patient Instructions (Signed)

## 2023-01-09 ENCOUNTER — Ambulatory Visit: Payer: Medicare Other | Admitting: Physician Assistant

## 2023-01-09 ENCOUNTER — Encounter: Payer: Self-pay | Admitting: Physician Assistant

## 2023-01-09 DIAGNOSIS — Z029 Encounter for administrative examinations, unspecified: Secondary | ICD-10-CM

## 2023-01-25 ENCOUNTER — Inpatient Hospital Stay: Payer: Medicare Other | Attending: Hematology & Oncology

## 2023-01-25 ENCOUNTER — Inpatient Hospital Stay: Payer: Medicare Other

## 2023-01-25 DIAGNOSIS — Z79899 Other long term (current) drug therapy: Secondary | ICD-10-CM | POA: Diagnosis not present

## 2023-01-25 DIAGNOSIS — D4620 Refractory anemia with excess of blasts, unspecified: Secondary | ICD-10-CM | POA: Diagnosis not present

## 2023-01-25 DIAGNOSIS — C9310 Chronic myelomonocytic leukemia not having achieved remission: Secondary | ICD-10-CM | POA: Diagnosis not present

## 2023-01-25 DIAGNOSIS — D509 Iron deficiency anemia, unspecified: Secondary | ICD-10-CM

## 2023-01-25 LAB — CMP (CANCER CENTER ONLY)
ALT: 9 U/L (ref 0–44)
AST: 13 U/L — ABNORMAL LOW (ref 15–41)
Albumin: 4.7 g/dL (ref 3.5–5.0)
Alkaline Phosphatase: 59 U/L (ref 38–126)
Anion gap: 10 (ref 5–15)
BUN: 21 mg/dL (ref 8–23)
CO2: 28 mmol/L (ref 22–32)
Calcium: 10.6 mg/dL — ABNORMAL HIGH (ref 8.9–10.3)
Chloride: 101 mmol/L (ref 98–111)
Creatinine: 1.4 mg/dL — ABNORMAL HIGH (ref 0.61–1.24)
GFR, Estimated: 48 mL/min — ABNORMAL LOW (ref >60)
Glucose, Bld: 98 mg/dL (ref 70–99)
Potassium: 4.1 mmol/L (ref 3.5–5.1)
Sodium: 139 mmol/L (ref 135–145)
Total Bilirubin: 0.6 mg/dL (ref 0.3–1.2)
Total Protein: 7.8 g/dL (ref 6.5–8.1)

## 2023-01-25 LAB — CBC WITH DIFFERENTIAL (CANCER CENTER ONLY)
Abs Immature Granulocytes: 0.01 10*3/uL (ref 0.00–0.07)
Basophils Absolute: 0 10*3/uL (ref 0.0–0.1)
Basophils Relative: 0 %
Eosinophils Absolute: 0 10*3/uL (ref 0.0–0.5)
Eosinophils Relative: 0 %
HCT: 35 % — ABNORMAL LOW (ref 39.0–52.0)
Hemoglobin: 11.6 g/dL — ABNORMAL LOW (ref 13.0–17.0)
Immature Granulocytes: 0 %
Lymphocytes Relative: 48 %
Lymphs Abs: 2.8 10*3/uL (ref 0.7–4.0)
MCH: 35 pg — ABNORMAL HIGH (ref 26.0–34.0)
MCHC: 33.1 g/dL (ref 30.0–36.0)
MCV: 105.7 fL — ABNORMAL HIGH (ref 80.0–100.0)
Monocytes Absolute: 2.5 10*3/uL — ABNORMAL HIGH (ref 0.1–1.0)
Monocytes Relative: 42 %
Neutro Abs: 0.6 10*3/uL — ABNORMAL LOW (ref 1.7–7.7)
Neutrophils Relative %: 10 %
Platelet Count: 105 10*3/uL — ABNORMAL LOW (ref 150–400)
RBC: 3.31 MIL/uL — ABNORMAL LOW (ref 4.22–5.81)
RDW: 14.7 % (ref 11.5–15.5)
Smear Review: NORMAL
WBC Count: 5.9 10*3/uL (ref 4.0–10.5)
nRBC: 0 % (ref 0.0–0.2)

## 2023-02-15 ENCOUNTER — Inpatient Hospital Stay: Payer: Medicare Other

## 2023-02-15 VITALS — BP 146/77 | HR 79 | Temp 97.7°F | Resp 19

## 2023-02-15 DIAGNOSIS — Z79899 Other long term (current) drug therapy: Secondary | ICD-10-CM | POA: Diagnosis not present

## 2023-02-15 DIAGNOSIS — D4620 Refractory anemia with excess of blasts, unspecified: Secondary | ICD-10-CM

## 2023-02-15 DIAGNOSIS — C9310 Chronic myelomonocytic leukemia not having achieved remission: Secondary | ICD-10-CM | POA: Diagnosis not present

## 2023-02-15 DIAGNOSIS — D509 Iron deficiency anemia, unspecified: Secondary | ICD-10-CM

## 2023-02-15 LAB — CBC WITH DIFFERENTIAL (CANCER CENTER ONLY)
Abs Immature Granulocytes: 0.04 10*3/uL (ref 0.00–0.07)
Basophils Absolute: 0 10*3/uL (ref 0.0–0.1)
Basophils Relative: 0 %
Eosinophils Absolute: 0 10*3/uL (ref 0.0–0.5)
Eosinophils Relative: 0 %
HCT: 31.7 % — ABNORMAL LOW (ref 39.0–52.0)
Hemoglobin: 10.5 g/dL — ABNORMAL LOW (ref 13.0–17.0)
Immature Granulocytes: 1 %
Lymphocytes Relative: 38 %
Lymphs Abs: 2.3 10*3/uL (ref 0.7–4.0)
MCH: 34.9 pg — ABNORMAL HIGH (ref 26.0–34.0)
MCHC: 33.1 g/dL (ref 30.0–36.0)
MCV: 105.3 fL — ABNORMAL HIGH (ref 80.0–100.0)
Monocytes Absolute: 2.6 10*3/uL — ABNORMAL HIGH (ref 0.1–1.0)
Monocytes Relative: 43 %
Neutro Abs: 1.1 10*3/uL — ABNORMAL LOW (ref 1.7–7.7)
Neutrophils Relative %: 18 %
Platelet Count: 129 10*3/uL — ABNORMAL LOW (ref 150–400)
RBC: 3.01 MIL/uL — ABNORMAL LOW (ref 4.22–5.81)
RDW: 14.8 % (ref 11.5–15.5)
Smear Review: NORMAL
WBC Count: 6.1 10*3/uL (ref 4.0–10.5)
nRBC: 0 % (ref 0.0–0.2)

## 2023-02-15 LAB — CMP (CANCER CENTER ONLY)
ALT: 9 U/L (ref 0–44)
AST: 12 U/L — ABNORMAL LOW (ref 15–41)
Albumin: 4.6 g/dL (ref 3.5–5.0)
Alkaline Phosphatase: 74 U/L (ref 38–126)
Anion gap: 13 (ref 5–15)
BUN: 20 mg/dL (ref 8–23)
CO2: 28 mmol/L (ref 22–32)
Calcium: 10.5 mg/dL — ABNORMAL HIGH (ref 8.9–10.3)
Chloride: 102 mmol/L (ref 98–111)
Creatinine: 1.35 mg/dL — ABNORMAL HIGH (ref 0.61–1.24)
GFR, Estimated: 50 mL/min — ABNORMAL LOW (ref >60)
Glucose, Bld: 106 mg/dL — ABNORMAL HIGH (ref 70–99)
Potassium: 3.8 mmol/L (ref 3.5–5.1)
Sodium: 143 mmol/L (ref 135–145)
Total Bilirubin: 0.6 mg/dL (ref 0.3–1.2)
Total Protein: 7 g/dL (ref 6.5–8.1)

## 2023-02-15 MED ORDER — DARBEPOETIN ALFA 300 MCG/0.6ML IJ SOSY
300.0000 ug | PREFILLED_SYRINGE | Freq: Once | INTRAMUSCULAR | Status: AC
  Administered 2023-02-15: 300 ug via SUBCUTANEOUS
  Filled 2023-02-15: qty 0.6

## 2023-02-15 NOTE — Patient Instructions (Signed)

## 2023-03-07 DIAGNOSIS — H40003 Preglaucoma, unspecified, bilateral: Secondary | ICD-10-CM | POA: Diagnosis not present

## 2023-03-08 ENCOUNTER — Inpatient Hospital Stay: Payer: Medicare Other

## 2023-03-08 ENCOUNTER — Inpatient Hospital Stay: Payer: Medicare Other | Attending: Hematology & Oncology

## 2023-03-08 DIAGNOSIS — D4620 Refractory anemia with excess of blasts, unspecified: Secondary | ICD-10-CM | POA: Diagnosis not present

## 2023-03-08 DIAGNOSIS — D509 Iron deficiency anemia, unspecified: Secondary | ICD-10-CM

## 2023-03-08 DIAGNOSIS — C9310 Chronic myelomonocytic leukemia not having achieved remission: Secondary | ICD-10-CM | POA: Insufficient documentation

## 2023-03-08 LAB — CBC WITH DIFFERENTIAL (CANCER CENTER ONLY)
Abs Immature Granulocytes: 0.04 10*3/uL (ref 0.00–0.07)
Basophils Absolute: 0 10*3/uL (ref 0.0–0.1)
Basophils Relative: 0 %
Eosinophils Absolute: 0 10*3/uL (ref 0.0–0.5)
Eosinophils Relative: 0 %
HCT: 33.6 % — ABNORMAL LOW (ref 39.0–52.0)
Hemoglobin: 11 g/dL — ABNORMAL LOW (ref 13.0–17.0)
Immature Granulocytes: 1 %
Lymphocytes Relative: 41 %
Lymphs Abs: 2.5 10*3/uL (ref 0.7–4.0)
MCH: 35.1 pg — ABNORMAL HIGH (ref 26.0–34.0)
MCHC: 32.7 g/dL (ref 30.0–36.0)
MCV: 107.3 fL — ABNORMAL HIGH (ref 80.0–100.0)
Monocytes Absolute: 2.6 10*3/uL — ABNORMAL HIGH (ref 0.1–1.0)
Monocytes Relative: 41 %
Neutro Abs: 1.1 10*3/uL — ABNORMAL LOW (ref 1.7–7.7)
Neutrophils Relative %: 17 %
Platelet Count: 106 10*3/uL — ABNORMAL LOW (ref 150–400)
RBC: 3.13 MIL/uL — ABNORMAL LOW (ref 4.22–5.81)
RDW: 15.2 % (ref 11.5–15.5)
WBC Count: 6.2 10*3/uL (ref 4.0–10.5)
nRBC: 0 % (ref 0.0–0.2)

## 2023-03-08 LAB — CMP (CANCER CENTER ONLY)
ALT: 7 U/L (ref 0–44)
AST: 13 U/L — ABNORMAL LOW (ref 15–41)
Albumin: 4.7 g/dL (ref 3.5–5.0)
Alkaline Phosphatase: 73 U/L (ref 38–126)
Anion gap: 11 (ref 5–15)
BUN: 20 mg/dL (ref 8–23)
CO2: 26 mmol/L (ref 22–32)
Calcium: 10.4 mg/dL — ABNORMAL HIGH (ref 8.9–10.3)
Chloride: 105 mmol/L (ref 98–111)
Creatinine: 1.31 mg/dL — ABNORMAL HIGH (ref 0.61–1.24)
GFR, Estimated: 52 mL/min — ABNORMAL LOW (ref >60)
Glucose, Bld: 97 mg/dL (ref 70–99)
Potassium: 4.5 mmol/L (ref 3.5–5.1)
Sodium: 142 mmol/L (ref 135–145)
Total Bilirubin: 0.7 mg/dL (ref 0.3–1.2)
Total Protein: 7.5 g/dL (ref 6.5–8.1)

## 2023-03-08 NOTE — Progress Notes (Signed)
Labs reviewed by provider. Hgb 11.0. Parameters not met no injection needed Walked out to lobby gave pt copy of labs results. No concerns at this time.

## 2023-03-31 ENCOUNTER — Telehealth: Payer: Self-pay | Admitting: Family Medicine

## 2023-03-31 NOTE — Telephone Encounter (Signed)
Pt has not been seen since 05/2021. He needs appointment

## 2023-03-31 NOTE — Telephone Encounter (Signed)
Prescription Request  03/31/2023  Is this a "Controlled Substance" medicine? No  LOV: Visit date not found  What is the name of the medication or equipment? lisinopril-hydrochlorothiazide (ZESTORETIC) 10-12.5 MG tablet [[id number]]   venlafaxine XR (EFFEXOR-XR) 75 MG 24 hr capsule   atorvastatin (LIPITOR) 40 MG tablet [[id number]]   terazosin (HYTRIN) 1 MG capsule [[id number]]   Have you contacted your pharmacy to request a refill? No   Which pharmacy would you like this sent to?  [facility] - [place], [place] - [address] [place] [address] Phone: [phone] Fax: [phone]  [id number] - [place], [place] - [address] AT [facility] [address] [place] [address] Phone: [phone] Fax: [phone]    Patient notified that their request is being sent to the clinical staff for review and that they should receive a response within 2 business days.   Please advise at Mobile [phone] (mobile)

## 2023-04-04 NOTE — Telephone Encounter (Signed)
Attempted to call patient to advise he needs appt but phone just rang. No vm

## 2023-04-05 ENCOUNTER — Encounter: Payer: Self-pay | Admitting: Family

## 2023-04-05 ENCOUNTER — Inpatient Hospital Stay: Payer: Medicare Other

## 2023-04-05 ENCOUNTER — Inpatient Hospital Stay: Payer: Medicare Other | Attending: Hematology & Oncology | Admitting: Family

## 2023-04-05 VITALS — BP 157/84 | HR 87 | Temp 98.2°F | Resp 18 | Wt 170.0 lb

## 2023-04-05 DIAGNOSIS — Z79899 Other long term (current) drug therapy: Secondary | ICD-10-CM | POA: Diagnosis not present

## 2023-04-05 DIAGNOSIS — D4620 Refractory anemia with excess of blasts, unspecified: Secondary | ICD-10-CM | POA: Insufficient documentation

## 2023-04-05 DIAGNOSIS — C9311 Chronic myelomonocytic leukemia, in remission: Secondary | ICD-10-CM | POA: Diagnosis not present

## 2023-04-05 DIAGNOSIS — D509 Iron deficiency anemia, unspecified: Secondary | ICD-10-CM

## 2023-04-05 DIAGNOSIS — C9310 Chronic myelomonocytic leukemia not having achieved remission: Secondary | ICD-10-CM

## 2023-04-05 LAB — CBC WITH DIFFERENTIAL (CANCER CENTER ONLY)
Abs Immature Granulocytes: 0.06 10*3/uL (ref 0.00–0.07)
Basophils Absolute: 0 10*3/uL (ref 0.0–0.1)
Basophils Relative: 0 %
Eosinophils Absolute: 0 10*3/uL (ref 0.0–0.5)
Eosinophils Relative: 0 %
HCT: 30.1 % — ABNORMAL LOW (ref 39.0–52.0)
Hemoglobin: 9.7 g/dL — ABNORMAL LOW (ref 13.0–17.0)
Immature Granulocytes: 1 %
Lymphocytes Relative: 36 %
Lymphs Abs: 2.4 10*3/uL (ref 0.7–4.0)
MCH: 34.2 pg — ABNORMAL HIGH (ref 26.0–34.0)
MCHC: 32.2 g/dL (ref 30.0–36.0)
MCV: 106 fL — ABNORMAL HIGH (ref 80.0–100.0)
Monocytes Absolute: 2.6 10*3/uL — ABNORMAL HIGH (ref 0.1–1.0)
Monocytes Relative: 40 %
Neutro Abs: 1.5 10*3/uL — ABNORMAL LOW (ref 1.7–7.7)
Neutrophils Relative %: 23 %
Platelet Count: 143 10*3/uL — ABNORMAL LOW (ref 150–400)
RBC: 2.84 MIL/uL — ABNORMAL LOW (ref 4.22–5.81)
RDW: 15.2 % (ref 11.5–15.5)
Smear Review: NORMAL
WBC Count: 6.7 10*3/uL (ref 4.0–10.5)
nRBC: 0 % (ref 0.0–0.2)

## 2023-04-05 LAB — CMP (CANCER CENTER ONLY)
ALT: 10 U/L (ref 0–44)
AST: 13 U/L — ABNORMAL LOW (ref 15–41)
Albumin: 4.6 g/dL (ref 3.5–5.0)
Alkaline Phosphatase: 68 U/L (ref 38–126)
Anion gap: 10 (ref 5–15)
BUN: 17 mg/dL (ref 8–23)
CO2: 27 mmol/L (ref 22–32)
Calcium: 10.4 mg/dL — ABNORMAL HIGH (ref 8.9–10.3)
Chloride: 103 mmol/L (ref 98–111)
Creatinine: 1.28 mg/dL — ABNORMAL HIGH (ref 0.61–1.24)
GFR, Estimated: 53 mL/min — ABNORMAL LOW (ref >60)
Glucose, Bld: 112 mg/dL — ABNORMAL HIGH (ref 70–99)
Potassium: 4 mmol/L (ref 3.5–5.1)
Sodium: 140 mmol/L (ref 135–145)
Total Bilirubin: 0.5 mg/dL (ref 0.3–1.2)
Total Protein: 7.6 g/dL (ref 6.5–8.1)

## 2023-04-05 LAB — IRON AND IRON BINDING CAPACITY (CC-WL,HP ONLY)
Iron: 93 ug/dL (ref 45–182)
Saturation Ratios: 32 % (ref 17.9–39.5)
TIBC: 293 ug/dL (ref 250–450)
UIBC: 200 ug/dL (ref 117–376)

## 2023-04-05 LAB — RETICULOCYTES
Immature Retic Fract: 16.1 % — ABNORMAL HIGH (ref 2.3–15.9)
RBC.: 2.81 MIL/uL — ABNORMAL LOW (ref 4.22–5.81)
Retic Count, Absolute: 30.1 10*3/uL (ref 19.0–186.0)
Retic Ct Pct: 1.1 % (ref 0.4–3.1)

## 2023-04-05 LAB — FERRITIN: Ferritin: 160 ng/mL (ref 24–336)

## 2023-04-05 MED ORDER — DARBEPOETIN ALFA 300 MCG/0.6ML IJ SOSY
300.0000 ug | PREFILLED_SYRINGE | Freq: Once | INTRAMUSCULAR | Status: AC
  Administered 2023-04-05: 300 ug via SUBCUTANEOUS
  Filled 2023-04-05: qty 0.6

## 2023-04-05 NOTE — Patient Instructions (Signed)

## 2023-04-05 NOTE — Progress Notes (Signed)
Hematology and Oncology Follow Up Visit  Andrew Ramos [id number] 09-Apr-1932 89 y.o. 04/05/2023   Principle Diagnosis:  Chronic myelomonocytic leukemia --intermediate 2  risk   Current Therapy:        Aranesp 300 mcg sq q 3 week for Hgb <11 IV Iron as indicated    Interim History:  Andrew Ramos is here today for follow-up and ESA injection. Hgb today is 9.4, MCV 106, platelets 143 and WBC count 6.7.  He notes some fatigue but otherwise has no complaints.  No blood loss noted. No bruising or petechiae. No fever, chills, n/v, cough, rash, dizziness, SOB, chest pain, palpitations, abdominal pain or changes in bowel or bladder habits.  No swelling in his extremities.  No falls or syncope. He is ambulating with a cane for added support.   He states that he is eating healthy and drinking lots of ice water. His weight is stable at 170 lbs.   ECOG Performance Status: 1 - Symptomatic but completely ambulatory  Medications:  Allergies as of 04/05/2023   No Known Allergies      Medication List        Accurate as of April 05, 2023 10:28 AM. If you have any questions, ask your nurse or doctor.          atorvastatin 40 MG tablet Commonly known as: LIPITOR TAKE 1 TABLET DAILY   CALCIUM 600 + D PO Take 1 tablet by mouth every morning.   Centrum Silver Ultra Mens Tabs Take 1 tablet by mouth every evening.   cholecalciferol 25 MCG (1000 UNIT) tablet Commonly known as: VITAMIN D3 Take 1,000 Units by mouth every morning.   docusate sodium 100 MG capsule Commonly known as: COLACE Take 200 mg by mouth daily as needed for mild constipation.   donepezil 10 MG tablet Commonly known as: ARICEPT TAKE 1 TABLET AT BEDTIME   fluticasone 50 MCG/ACT nasal spray Commonly known as: FLONASE Place 2 sprays into both nostrils daily.   hydroxychloroquine 200 MG tablet Commonly known as: PLAQUENIL Take 1 tablet by mouth 2 (two) times daily.   Iron (Ferrous Sulfate) 325 (65 Fe) MG  Tabs Take 325 mg by mouth daily.   lisinopril-hydrochlorothiazide 10-12.5 MG tablet Commonly known as: ZESTORETIC TAKE 1 TABLET DAILY   loratadine 10 MG tablet Commonly known as: CLARITIN TAKE 1 TABLET DAILY   terazosin 1 MG capsule Commonly known as: HYTRIN TAKE 1 CAPSULE AT BEDTIME   venlafaxine XR 75 MG 24 hr capsule Commonly known as: EFFEXOR-XR TAKE 1 CAPSULE DAILY WITH BREAKFAST        Allergies: No Known Allergies  Past Medical History, Surgical history, Social history, and Family History were reviewed and updated.  Review of Systems: All other 10 point review of systems is negative.   Physical Exam:  weight is 170 lb (77.1 kg). His oral temperature is 98.2 F (36.8 C). His blood pressure is 157/84 (abnormal) and his pulse is 87. His respiration is 18 and oxygen saturation is 98%.   Wt Readings from Last 3 Encounters:  04/05/23 170 lb (77.1 kg)  01/04/23 169 lb 1.3 oz (76.7 kg)  10/05/22 172 lb (78 kg)    Ocular: Sclerae unicteric, pupils equal, round and reactive to light Ear-nose-throat: Oropharynx clear, dentition fair Lymphatic: No cervical or supraclavicular adenopathy Lungs no rales or rhonchi, good excursion bilaterally Heart regular rate and rhythm, no murmur appreciated Abd soft, nontender, positive bowel sounds MSK no focal spinal tenderness, no joint edema Neuro: non-focal,  well-oriented, appropriate affect Breasts: Deferred   Lab Results  Component Value Date   WBC 6.2 03/08/2023   HGB 11.0 (L) 03/08/2023   HCT 33.6 (L) 03/08/2023   MCV 107.3 (H) 03/08/2023   PLT 106 (L) 03/08/2023   Lab Results  Component Value Date   FERRITIN 77 01/04/2023   IRON 118 01/04/2023   TIBC 368 01/04/2023   UIBC 250 01/04/2023   IRONPCTSAT 32 01/04/2023   Lab Results  Component Value Date   RETICCTPCT 1.1 04/05/2023   RBC 2.81 (L) 04/05/2023   No results found for: "KPAFRELGTCHN", "LAMBDASER", "KAPLAMBRATIO" No results found for: "IGGSERUM",  "IGA", "IGMSERUM" No results found for: "TOTALPROTELP", "ALBUMINELP", "A1GS", "A2GS", "BETS", "BETA2SER", "GAMS", "MSPIKE", "SPEI"   Chemistry      Component Value Date/Time   NA 142 03/08/2023 1002   NA 144 02/26/2021 0000   K 4.5 03/08/2023 1002   CL 105 03/08/2023 1002   CO2 26 03/08/2023 1002   BUN 20 03/08/2023 1002   BUN 21 02/26/2021 0000   CREATININE 1.31 (H) 03/08/2023 1002   CREATININE 1.14 (H) 06/04/2020 1041   GLU 94 02/26/2021 0000      Component Value Date/Time   CALCIUM 10.4 (H) 03/08/2023 1002   ALKPHOS 73 03/08/2023 1002   AST 13 (L) 03/08/2023 1002   ALT 7 03/08/2023 1002   BILITOT 0.7 03/08/2023 1002       Impression and Plan: Andrew Ramos is a very pleasant 89 yo gentleman with CML and multifactorial anemia. ESA given, Hgb 9.7.  Iron studies are pending.  Lab and injection every 3 weeks, follow-up in 3 months.   Eileen Stanford, NP 7/17/202410:28 AM

## 2023-05-08 ENCOUNTER — Inpatient Hospital Stay: Payer: Medicare Other

## 2023-05-08 ENCOUNTER — Inpatient Hospital Stay: Payer: Medicare Other | Attending: Hematology & Oncology

## 2023-05-08 VITALS — BP 145/80 | HR 89 | Temp 98.0°F | Resp 20

## 2023-05-08 DIAGNOSIS — D509 Iron deficiency anemia, unspecified: Secondary | ICD-10-CM

## 2023-05-08 DIAGNOSIS — C9310 Chronic myelomonocytic leukemia not having achieved remission: Secondary | ICD-10-CM | POA: Insufficient documentation

## 2023-05-08 DIAGNOSIS — D4620 Refractory anemia with excess of blasts, unspecified: Secondary | ICD-10-CM

## 2023-05-08 DIAGNOSIS — C9311 Chronic myelomonocytic leukemia, in remission: Secondary | ICD-10-CM

## 2023-05-08 LAB — CBC WITH DIFFERENTIAL (CANCER CENTER ONLY)
Abs Immature Granulocytes: 0.04 10*3/uL (ref 0.00–0.07)
Basophils Absolute: 0 10*3/uL (ref 0.0–0.1)
Basophils Relative: 0 %
Eosinophils Absolute: 0 10*3/uL (ref 0.0–0.5)
Eosinophils Relative: 0 %
HCT: 31.6 % — ABNORMAL LOW (ref 39.0–52.0)
Hemoglobin: 10.1 g/dL — ABNORMAL LOW (ref 13.0–17.0)
Immature Granulocytes: 1 %
Lymphocytes Relative: 41 %
Lymphs Abs: 1.9 10*3/uL (ref 0.7–4.0)
MCH: 34.6 pg — ABNORMAL HIGH (ref 26.0–34.0)
MCHC: 32 g/dL (ref 30.0–36.0)
MCV: 108.2 fL — ABNORMAL HIGH (ref 80.0–100.0)
Monocytes Absolute: 2 10*3/uL — ABNORMAL HIGH (ref 0.1–1.0)
Monocytes Relative: 41 %
Neutro Abs: 0.8 10*3/uL — ABNORMAL LOW (ref 1.7–7.7)
Neutrophils Relative %: 17 %
Platelet Count: 124 10*3/uL — ABNORMAL LOW (ref 150–400)
RBC: 2.92 MIL/uL — ABNORMAL LOW (ref 4.22–5.81)
RDW: 15.2 % (ref 11.5–15.5)
Smear Review: NORMAL
WBC Count: 4.8 10*3/uL (ref 4.0–10.5)
nRBC: 0 % (ref 0.0–0.2)

## 2023-05-08 LAB — CMP (CANCER CENTER ONLY)
ALT: 7 U/L (ref 0–44)
AST: 11 U/L — ABNORMAL LOW (ref 15–41)
Albumin: 4.4 g/dL (ref 3.5–5.0)
Alkaline Phosphatase: 66 U/L (ref 38–126)
Anion gap: 8 (ref 5–15)
BUN: 22 mg/dL (ref 8–23)
CO2: 29 mmol/L (ref 22–32)
Calcium: 9.9 mg/dL (ref 8.9–10.3)
Chloride: 104 mmol/L (ref 98–111)
Creatinine: 1.23 mg/dL (ref 0.61–1.24)
GFR, Estimated: 56 mL/min — ABNORMAL LOW (ref >60)
Glucose, Bld: 114 mg/dL — ABNORMAL HIGH (ref 70–99)
Potassium: 3.8 mmol/L (ref 3.5–5.1)
Sodium: 141 mmol/L (ref 135–145)
Total Bilirubin: 0.5 mg/dL (ref 0.3–1.2)
Total Protein: 7.3 g/dL (ref 6.5–8.1)

## 2023-05-08 LAB — FERRITIN: Ferritin: 54 ng/mL (ref 24–336)

## 2023-05-08 LAB — RETICULOCYTES
Immature Retic Fract: 13.9 % (ref 2.3–15.9)
RBC.: 2.91 MIL/uL — ABNORMAL LOW (ref 4.22–5.81)
Retic Count, Absolute: 34.6 10*3/uL (ref 19.0–186.0)
Retic Ct Pct: 1.2 % (ref 0.4–3.1)

## 2023-05-08 LAB — IRON AND IRON BINDING CAPACITY (CC-WL,HP ONLY)
Iron: 93 ug/dL (ref 45–182)
Saturation Ratios: 27 % (ref 17.9–39.5)
TIBC: 339 ug/dL (ref 250–450)
UIBC: 246 ug/dL (ref 117–376)

## 2023-05-08 MED ORDER — DARBEPOETIN ALFA 300 MCG/0.6ML IJ SOSY
300.0000 ug | PREFILLED_SYRINGE | Freq: Once | INTRAMUSCULAR | Status: AC
  Administered 2023-05-08: 300 ug via SUBCUTANEOUS
  Filled 2023-05-08: qty 0.6

## 2023-05-08 NOTE — Patient Instructions (Signed)

## 2023-05-23 ENCOUNTER — Telehealth: Payer: Self-pay | Admitting: Family Medicine

## 2023-05-23 NOTE — Telephone Encounter (Signed)
Caller/Agency: [facility] ([facility])  Callback Number: [phone]  Requesting OT/PT/Skilled Nursing/Social Work/Speech Therapy: Speech therapy eval

## 2023-05-24 NOTE — Telephone Encounter (Signed)
Verbal given 

## 2023-05-26 ENCOUNTER — Telehealth: Payer: Self-pay | Admitting: Family Medicine

## 2023-05-26 DIAGNOSIS — K5900 Constipation, unspecified: Secondary | ICD-10-CM | POA: Diagnosis not present

## 2023-05-26 DIAGNOSIS — D72819 Decreased white blood cell count, unspecified: Secondary | ICD-10-CM | POA: Diagnosis not present

## 2023-05-26 DIAGNOSIS — E7800 Pure hypercholesterolemia, unspecified: Secondary | ICD-10-CM | POA: Diagnosis not present

## 2023-05-26 DIAGNOSIS — I10 Essential (primary) hypertension: Secondary | ICD-10-CM | POA: Diagnosis not present

## 2023-05-26 DIAGNOSIS — Z9181 History of falling: Secondary | ICD-10-CM | POA: Diagnosis not present

## 2023-05-26 DIAGNOSIS — R29898 Other symptoms and signs involving the musculoskeletal system: Secondary | ICD-10-CM | POA: Diagnosis not present

## 2023-05-26 DIAGNOSIS — G3184 Mild cognitive impairment, so stated: Secondary | ICD-10-CM | POA: Diagnosis not present

## 2023-05-26 NOTE — Telephone Encounter (Signed)
Verbal given 

## 2023-05-26 NOTE — Telephone Encounter (Signed)
Caller/Agency: [facility] Number: [phone] (ok to lvm)  Requesting OT/PT/Skilled Nursing/Social Work/Speech Therapy: Speech Therapy Frequency: 1x1, 2x4 1x4

## 2023-05-29 ENCOUNTER — Encounter: Payer: Self-pay | Admitting: Family Medicine

## 2023-05-29 ENCOUNTER — Inpatient Hospital Stay: Payer: Medicare Other

## 2023-05-29 ENCOUNTER — Inpatient Hospital Stay: Payer: Medicare Other | Attending: Hematology & Oncology

## 2023-05-29 ENCOUNTER — Ambulatory Visit (INDEPENDENT_AMBULATORY_CARE_PROVIDER_SITE_OTHER): Payer: Medicare Other | Admitting: Family Medicine

## 2023-05-29 VITALS — BP 140/80 | HR 83 | Temp 97.8°F | Resp 20 | Ht 71.0 in | Wt 171.6 lb

## 2023-05-29 DIAGNOSIS — E119 Type 2 diabetes mellitus without complications: Secondary | ICD-10-CM

## 2023-05-29 DIAGNOSIS — I10 Essential (primary) hypertension: Secondary | ICD-10-CM | POA: Diagnosis not present

## 2023-05-29 DIAGNOSIS — G3184 Mild cognitive impairment, so stated: Secondary | ICD-10-CM

## 2023-05-29 DIAGNOSIS — E785 Hyperlipidemia, unspecified: Secondary | ICD-10-CM | POA: Diagnosis not present

## 2023-05-29 DIAGNOSIS — E1165 Type 2 diabetes mellitus with hyperglycemia: Secondary | ICD-10-CM

## 2023-05-29 DIAGNOSIS — Z23 Encounter for immunization: Secondary | ICD-10-CM

## 2023-05-29 DIAGNOSIS — E1151 Type 2 diabetes mellitus with diabetic peripheral angiopathy without gangrene: Secondary | ICD-10-CM | POA: Diagnosis not present

## 2023-05-29 DIAGNOSIS — E1169 Type 2 diabetes mellitus with other specified complication: Secondary | ICD-10-CM | POA: Diagnosis not present

## 2023-05-29 DIAGNOSIS — F0150 Vascular dementia without behavioral disturbance: Secondary | ICD-10-CM | POA: Diagnosis not present

## 2023-05-29 DIAGNOSIS — R269 Unspecified abnormalities of gait and mobility: Secondary | ICD-10-CM | POA: Diagnosis not present

## 2023-05-29 NOTE — Assessment & Plan Note (Signed)
Tolerating statin, encouraged heart healthy diet, avoid trans fats, minimize simple carbs and saturated fats. Increase exercise as tolerated 

## 2023-05-29 NOTE — Assessment & Plan Note (Signed)
Per neuro Worsening per caretaker ---- pt missed neuro app in April  They will call and get a new appointment

## 2023-05-29 NOTE — Assessment & Plan Note (Signed)
Labs ordered.

## 2023-05-29 NOTE — Assessment & Plan Note (Signed)
Well controlled, no changes to meds. Encouraged heart healthy diet such as the DASH diet and exercise as tolerated.  °

## 2023-05-29 NOTE — Progress Notes (Signed)
Established Patient Office Visit  Subjective   Patient ID: Andrew Ramos, male    DOB: [birth year]  Age: 89 y.o. MRN: [id number]  Chief Complaint  Patient presents with   Follow-up    HPI Discussed the use of AI scribe software for clinical note transcription with the patient, who gave verbal consent to proceed.  History of Present Illness   The patient, with a history of low platelets and cognitive impairment, presents for a routine follow-up. He has been receiving iron infusions due to low hemoglobin levels, with the most recent hemoglobin recorded at 9.7. The patient's platelet count has been consistently low, with the most recent count at 105, leading to frequent bruising. Despite this, he reports no bleeding episodes.  The patient also has cognitive impairment, for which he is receiving speech therapy. The spouse reports that his memory is declining, but there is no mention of any significant functional impairment. The patient is also on medication for an unspecified condition managed by a neurologist, which he takes at night.  The patient has been experiencing issues with excess ear wax, which has been affecting the performance of his hearing aids. He has been provided with a prescription from the [place] to manage this. The patient also mentions a need for refills on his medication, which the doctor agrees to handle.  The patient's spouse notes a change in his gait, describing it as more hunched. He uses a cane for assistance but denies any balance issues. He has access to exercise classes and a gym in his living facility, which he plans to utilize more.      {History (Opt  ROS    Objective:     BP (!) 140/80 (BP Location: Right Arm, Patient Position: Sitting, Cuff Size: Normal)   Pulse 83   Temp 97.8 F (36.6 C) (Oral)   Resp 20   Ht 5\' 11"  (1.803 m)   Wt 171 lb 9.6 oz (77.8 kg)   SpO2 99%   BMI 23.93 kg/m  BP Readings from Last 3 Encounters:  05/29/23 (!) 140/80   05/08/23 (!) 145/80  04/05/23 (!) 157/84   Wt Readings from Last 3 Encounters:  05/29/23 171 lb 9.6 oz (77.8 kg)  04/05/23 170 lb (77.1 kg)  01/04/23 169 lb 1.3 oz (76.7 kg)   SpO2 Readings from Last 3 Encounters:  05/29/23 99%  05/08/23 100%  04/05/23 98%      Physical Exam   No results found for any visits on 05/29/23.  Last CBC Lab Results  Component Value Date   WBC 4.8 05/08/2023   HGB 10.1 (L) 05/08/2023   HCT 31.6 (L) 05/08/2023   MCV 108.2 (H) 05/08/2023   MCH 34.6 (H) 05/08/2023   RDW 15.2 05/08/2023   PLT 124 (L) 05/08/2023   Last metabolic panel Lab Results  Component Value Date   GLUCOSE 114 (H) 05/08/2023   NA 141 05/08/2023   K 3.8 05/08/2023   CL 104 05/08/2023   CO2 29 05/08/2023   BUN 22 05/08/2023   CREATININE 1.23 05/08/2023   GFRNONAA 56 (L) 05/08/2023   CALCIUM 9.9 05/08/2023   PROT 7.3 05/08/2023   ALBUMIN 4.4 05/08/2023   BILITOT 0.5 05/08/2023   ALKPHOS 66 05/08/2023   AST 11 (L) 05/08/2023   ALT 7 05/08/2023   ANIONGAP 8 05/08/2023   Last lipids Lab Results  Component Value Date   CHOL 101 02/18/2021   HDL 66 02/18/2021   LDLCALC 26 02/18/2021  TRIG 46 02/18/2021   CHOLHDL 2.0 06/04/2020   Last hemoglobin A1c Lab Results  Component Value Date   HGBA1C 5.6 02/18/2021   Last thyroid functions Lab Results  Component Value Date   TSH 2.12 06/04/2020   Last vitamin D Lab Results  Component Value Date   VD25OH 51 06/04/2020   Last vitamin B12 and Folate Lab Results  Component Value Date   VITAMINB12 618 06/04/2020      The ASCVD Risk score (Arnett DK, et al., 2019) failed to calculate for the following reasons:   The 2019 ASCVD risk score is only valid for ages 60 to 41    Assessment & Plan:   Problem List Items Addressed This Visit       Unprioritized   Mild cognitive impairment   Relevant Orders   Ambulatory referral to Home Health   DM (diabetes mellitus) type II, controlled, with peripheral  vascular disorder (HCC)   Relevant Orders   Lipid panel   TSH   Comprehensive metabolic panel   CBC with Differential/Platelet   Hemoglobin A1c   Diet-controlled diabetes mellitus (HCC)   Vascular dementia without behavioral disturbance (HCC)    Per neuro Worsening per caretaker ---- pt missed neuro app in April  They will call and get a new appointment       Uncontrolled type 2 diabetes mellitus with hyperglycemia (HCC)    Labs ordered       Hyperlipidemia    Tolerating statin, encouraged heart healthy diet, avoid trans fats, minimize simple carbs and saturated fats. Increase exercise as tolerated       Essential hypertension    Well controlled, no changes to meds. Encouraged heart healthy diet such as the DASH diet and exercise as tolerated.        Other Visit Diagnoses     Need for influenza vaccination    -  Primary   Relevant Orders   Flu Vaccine Trivalent High Dose (Fluad) (Completed)   Gait abnormality       Relevant Orders   Ambulatory referral to Home Health   Primary hypertension       Relevant Orders   Lipid panel   TSH   Comprehensive metabolic panel   CBC with Differential/Platelet   Hyperlipidemia associated with type 2 diabetes mellitus (HCC)       Relevant Orders   Lipid panel   TSH   Comprehensive metabolic panel   CBC with Differential/Platelet      Assessment and Plan    Anemia Hemoglobin decreased from 11.6 in May to 9.7 in July. Patient is receiving iron infusions for management. -Continue iron infusions as scheduled.  Thrombocytopenia Platelets have been consistently low, with recent values of 105 and 143. Patient reports easy bruising but no bleeding. -Monitor platelet count and symptoms.  Mild Cognitive Impairment Patient is receiving speech therapy. -Continue speech therapy. -Consider additional cognitive exercises and activities.  Prescription Refills Patient needs refills for medication prescribed by neurology and for ear  wax medication from the [place]. -Refill prescriptions as needed.  General Health Maintenance -Administer influenza vaccine today. -Discuss COVID-19 booster shot availability and scheduling.  Follow-up -Call neurologist Dr. Karel Jarvis to schedule a follow-up appointment. -Check on home health services for speech therapy. -Consider physical therapy if balance issues persist.       No follow-ups on file.    Donato Schultz, DO

## 2023-05-30 ENCOUNTER — Telehealth: Payer: Self-pay | Admitting: Family Medicine

## 2023-05-30 DIAGNOSIS — D72819 Decreased white blood cell count, unspecified: Secondary | ICD-10-CM | POA: Diagnosis not present

## 2023-05-30 DIAGNOSIS — E7800 Pure hypercholesterolemia, unspecified: Secondary | ICD-10-CM | POA: Diagnosis not present

## 2023-05-30 DIAGNOSIS — K5900 Constipation, unspecified: Secondary | ICD-10-CM | POA: Diagnosis not present

## 2023-05-30 DIAGNOSIS — R29898 Other symptoms and signs involving the musculoskeletal system: Secondary | ICD-10-CM | POA: Diagnosis not present

## 2023-05-30 DIAGNOSIS — I10 Essential (primary) hypertension: Secondary | ICD-10-CM | POA: Diagnosis not present

## 2023-05-30 DIAGNOSIS — G3184 Mild cognitive impairment, so stated: Secondary | ICD-10-CM | POA: Diagnosis not present

## 2023-05-30 NOTE — Telephone Encounter (Signed)
Denisha from [facility] called to confirm if order is for patient to get speech therapy and physical therapy. Spoke with CMA and confirmed both were needed. Denisha requested an updated order to be faxed with both speech and physical therapy specified on the order. Right now, it only clearly states PT. Please fax updated order to [phone]

## 2023-05-31 ENCOUNTER — Other Ambulatory Visit (INDEPENDENT_AMBULATORY_CARE_PROVIDER_SITE_OTHER): Payer: Medicare Other

## 2023-05-31 DIAGNOSIS — I10 Essential (primary) hypertension: Secondary | ICD-10-CM | POA: Diagnosis not present

## 2023-05-31 DIAGNOSIS — E1169 Type 2 diabetes mellitus with other specified complication: Secondary | ICD-10-CM

## 2023-05-31 DIAGNOSIS — E1151 Type 2 diabetes mellitus with diabetic peripheral angiopathy without gangrene: Secondary | ICD-10-CM

## 2023-05-31 DIAGNOSIS — E785 Hyperlipidemia, unspecified: Secondary | ICD-10-CM | POA: Diagnosis not present

## 2023-05-31 LAB — CBC WITH DIFFERENTIAL/PLATELET
Basophils Absolute: 0 10*3/uL (ref 0.0–0.1)
Basophils Relative: 0.2 % (ref 0.0–3.0)
Eosinophils Absolute: 0 10*3/uL (ref 0.0–0.7)
Eosinophils Relative: 0.2 % (ref 0.0–5.0)
HCT: 32.1 % — ABNORMAL LOW (ref 39.0–52.0)
Hemoglobin: 10.5 g/dL — ABNORMAL LOW (ref 13.0–17.0)
Lymphocytes Relative: 35.2 % (ref 12.0–46.0)
Lymphs Abs: 2.2 10*3/uL (ref 0.7–4.0)
MCHC: 32.7 g/dL (ref 30.0–36.0)
MCV: 103.8 fl — ABNORMAL HIGH (ref 78.0–100.0)
Monocytes Absolute: 2.8 10*3/uL — ABNORMAL HIGH (ref 0.1–1.0)
Monocytes Relative: 45.4 % — ABNORMAL HIGH (ref 3.0–12.0)
Neutro Abs: 1.2 10*3/uL — ABNORMAL LOW (ref 1.4–7.7)
Neutrophils Relative %: 19 % — ABNORMAL LOW (ref 43.0–77.0)
Platelets: 109 10*3/uL — ABNORMAL LOW (ref 150.0–400.0)
RBC: 3.09 Mil/uL — ABNORMAL LOW (ref 4.22–5.81)
RDW: 16.7 % — ABNORMAL HIGH (ref 11.5–15.5)
WBC: 6.2 10*3/uL (ref 4.0–10.5)

## 2023-05-31 LAB — COMPREHENSIVE METABOLIC PANEL
ALT: 7 U/L (ref 0–53)
AST: 10 U/L (ref 0–37)
Albumin: 4.1 g/dL (ref 3.5–5.2)
Alkaline Phosphatase: 70 U/L (ref 39–117)
BUN: 20 mg/dL (ref 6–23)
CO2: 26 meq/L (ref 19–32)
Calcium: 9.8 mg/dL (ref 8.4–10.5)
Chloride: 103 meq/L (ref 96–112)
Creatinine, Ser: 1.25 mg/dL (ref 0.40–1.50)
GFR: 51.11 mL/min — ABNORMAL LOW (ref >60.00)
Glucose, Bld: 92 mg/dL (ref 70–99)
Potassium: 3.8 meq/L (ref 3.5–5.1)
Sodium: 137 meq/L (ref 135–145)
Total Bilirubin: 0.7 mg/dL (ref 0.2–1.2)
Total Protein: 7.3 g/dL (ref 6.0–8.3)

## 2023-05-31 LAB — LIPID PANEL
Cholesterol: 126 mg/dL (ref 0–200)
HDL: 52.8 mg/dL (ref >39.00)
LDL Cholesterol: 60 mg/dL (ref 0–99)
NonHDL: 73.15
Total CHOL/HDL Ratio: 2
Triglycerides: 65 mg/dL (ref 0.0–149.0)
VLDL: 13 mg/dL (ref 0.0–40.0)

## 2023-05-31 LAB — HEMOGLOBIN A1C: Hgb A1c MFr Bld: 5.6 % (ref 4.6–6.5)

## 2023-05-31 LAB — TSH: TSH: 1.99 u[IU]/mL (ref 0.35–5.50)

## 2023-05-31 NOTE — Telephone Encounter (Signed)
Updated orders faxed.

## 2023-06-02 DIAGNOSIS — I10 Essential (primary) hypertension: Secondary | ICD-10-CM | POA: Diagnosis not present

## 2023-06-02 DIAGNOSIS — R29898 Other symptoms and signs involving the musculoskeletal system: Secondary | ICD-10-CM | POA: Diagnosis not present

## 2023-06-02 DIAGNOSIS — E7800 Pure hypercholesterolemia, unspecified: Secondary | ICD-10-CM | POA: Diagnosis not present

## 2023-06-02 DIAGNOSIS — G3184 Mild cognitive impairment, so stated: Secondary | ICD-10-CM | POA: Diagnosis not present

## 2023-06-02 DIAGNOSIS — D72819 Decreased white blood cell count, unspecified: Secondary | ICD-10-CM | POA: Diagnosis not present

## 2023-06-02 DIAGNOSIS — K5900 Constipation, unspecified: Secondary | ICD-10-CM | POA: Diagnosis not present

## 2023-06-05 DIAGNOSIS — I10 Essential (primary) hypertension: Secondary | ICD-10-CM | POA: Diagnosis not present

## 2023-06-05 DIAGNOSIS — G3184 Mild cognitive impairment, so stated: Secondary | ICD-10-CM | POA: Diagnosis not present

## 2023-06-05 DIAGNOSIS — K5900 Constipation, unspecified: Secondary | ICD-10-CM | POA: Diagnosis not present

## 2023-06-05 DIAGNOSIS — E7800 Pure hypercholesterolemia, unspecified: Secondary | ICD-10-CM | POA: Diagnosis not present

## 2023-06-05 DIAGNOSIS — R29898 Other symptoms and signs involving the musculoskeletal system: Secondary | ICD-10-CM | POA: Diagnosis not present

## 2023-06-05 DIAGNOSIS — D72819 Decreased white blood cell count, unspecified: Secondary | ICD-10-CM | POA: Diagnosis not present

## 2023-06-07 DIAGNOSIS — E7800 Pure hypercholesterolemia, unspecified: Secondary | ICD-10-CM | POA: Diagnosis not present

## 2023-06-07 DIAGNOSIS — K5900 Constipation, unspecified: Secondary | ICD-10-CM | POA: Diagnosis not present

## 2023-06-07 DIAGNOSIS — G3184 Mild cognitive impairment, so stated: Secondary | ICD-10-CM | POA: Diagnosis not present

## 2023-06-07 DIAGNOSIS — D72819 Decreased white blood cell count, unspecified: Secondary | ICD-10-CM | POA: Diagnosis not present

## 2023-06-07 DIAGNOSIS — R29898 Other symptoms and signs involving the musculoskeletal system: Secondary | ICD-10-CM | POA: Diagnosis not present

## 2023-06-07 DIAGNOSIS — I10 Essential (primary) hypertension: Secondary | ICD-10-CM | POA: Diagnosis not present

## 2023-06-12 DIAGNOSIS — G3184 Mild cognitive impairment, so stated: Secondary | ICD-10-CM | POA: Diagnosis not present

## 2023-06-12 DIAGNOSIS — I10 Essential (primary) hypertension: Secondary | ICD-10-CM | POA: Diagnosis not present

## 2023-06-12 DIAGNOSIS — R29898 Other symptoms and signs involving the musculoskeletal system: Secondary | ICD-10-CM | POA: Diagnosis not present

## 2023-06-12 DIAGNOSIS — K5900 Constipation, unspecified: Secondary | ICD-10-CM | POA: Diagnosis not present

## 2023-06-12 DIAGNOSIS — E7800 Pure hypercholesterolemia, unspecified: Secondary | ICD-10-CM | POA: Diagnosis not present

## 2023-06-12 DIAGNOSIS — D72819 Decreased white blood cell count, unspecified: Secondary | ICD-10-CM | POA: Diagnosis not present

## 2023-06-14 DIAGNOSIS — K5900 Constipation, unspecified: Secondary | ICD-10-CM | POA: Diagnosis not present

## 2023-06-14 DIAGNOSIS — G3184 Mild cognitive impairment, so stated: Secondary | ICD-10-CM | POA: Diagnosis not present

## 2023-06-14 DIAGNOSIS — R29898 Other symptoms and signs involving the musculoskeletal system: Secondary | ICD-10-CM | POA: Diagnosis not present

## 2023-06-14 DIAGNOSIS — I10 Essential (primary) hypertension: Secondary | ICD-10-CM | POA: Diagnosis not present

## 2023-06-14 DIAGNOSIS — D72819 Decreased white blood cell count, unspecified: Secondary | ICD-10-CM | POA: Diagnosis not present

## 2023-06-14 DIAGNOSIS — E7800 Pure hypercholesterolemia, unspecified: Secondary | ICD-10-CM | POA: Diagnosis not present

## 2023-06-19 ENCOUNTER — Inpatient Hospital Stay: Payer: Medicare Other

## 2023-06-19 DIAGNOSIS — I10 Essential (primary) hypertension: Secondary | ICD-10-CM | POA: Diagnosis not present

## 2023-06-19 DIAGNOSIS — E7800 Pure hypercholesterolemia, unspecified: Secondary | ICD-10-CM | POA: Diagnosis not present

## 2023-06-19 DIAGNOSIS — D72819 Decreased white blood cell count, unspecified: Secondary | ICD-10-CM | POA: Diagnosis not present

## 2023-06-19 DIAGNOSIS — K5900 Constipation, unspecified: Secondary | ICD-10-CM | POA: Diagnosis not present

## 2023-06-19 DIAGNOSIS — R29898 Other symptoms and signs involving the musculoskeletal system: Secondary | ICD-10-CM | POA: Diagnosis not present

## 2023-06-19 DIAGNOSIS — G3184 Mild cognitive impairment, so stated: Secondary | ICD-10-CM | POA: Diagnosis not present

## 2023-06-21 DIAGNOSIS — G3184 Mild cognitive impairment, so stated: Secondary | ICD-10-CM | POA: Diagnosis not present

## 2023-06-21 DIAGNOSIS — R29898 Other symptoms and signs involving the musculoskeletal system: Secondary | ICD-10-CM | POA: Diagnosis not present

## 2023-06-21 DIAGNOSIS — I10 Essential (primary) hypertension: Secondary | ICD-10-CM | POA: Diagnosis not present

## 2023-06-21 DIAGNOSIS — E7800 Pure hypercholesterolemia, unspecified: Secondary | ICD-10-CM | POA: Diagnosis not present

## 2023-06-21 DIAGNOSIS — K5900 Constipation, unspecified: Secondary | ICD-10-CM | POA: Diagnosis not present

## 2023-06-21 DIAGNOSIS — D72819 Decreased white blood cell count, unspecified: Secondary | ICD-10-CM | POA: Diagnosis not present

## 2023-06-23 ENCOUNTER — Telehealth: Payer: Self-pay | Admitting: Family Medicine

## 2023-06-23 NOTE — Telephone Encounter (Signed)
Sonal from [facility] called to request a verbal order for physical therapy as she stated that the pt has been close to falling several times. Please advise at [phone].

## 2023-06-25 DIAGNOSIS — R29898 Other symptoms and signs involving the musculoskeletal system: Secondary | ICD-10-CM | POA: Diagnosis not present

## 2023-06-25 DIAGNOSIS — Z9181 History of falling: Secondary | ICD-10-CM | POA: Diagnosis not present

## 2023-06-25 DIAGNOSIS — E7800 Pure hypercholesterolemia, unspecified: Secondary | ICD-10-CM | POA: Diagnosis not present

## 2023-06-25 DIAGNOSIS — D72819 Decreased white blood cell count, unspecified: Secondary | ICD-10-CM | POA: Diagnosis not present

## 2023-06-25 DIAGNOSIS — K5900 Constipation, unspecified: Secondary | ICD-10-CM | POA: Diagnosis not present

## 2023-06-25 DIAGNOSIS — I10 Essential (primary) hypertension: Secondary | ICD-10-CM | POA: Diagnosis not present

## 2023-06-25 DIAGNOSIS — G3184 Mild cognitive impairment, so stated: Secondary | ICD-10-CM | POA: Diagnosis not present

## 2023-06-26 DIAGNOSIS — I10 Essential (primary) hypertension: Secondary | ICD-10-CM | POA: Diagnosis not present

## 2023-06-26 DIAGNOSIS — R29898 Other symptoms and signs involving the musculoskeletal system: Secondary | ICD-10-CM | POA: Diagnosis not present

## 2023-06-26 DIAGNOSIS — D72819 Decreased white blood cell count, unspecified: Secondary | ICD-10-CM | POA: Diagnosis not present

## 2023-06-26 DIAGNOSIS — E7800 Pure hypercholesterolemia, unspecified: Secondary | ICD-10-CM | POA: Diagnosis not present

## 2023-06-26 DIAGNOSIS — G3184 Mild cognitive impairment, so stated: Secondary | ICD-10-CM | POA: Diagnosis not present

## 2023-06-26 DIAGNOSIS — K5900 Constipation, unspecified: Secondary | ICD-10-CM | POA: Diagnosis not present

## 2023-06-26 NOTE — Telephone Encounter (Signed)
Andrew Ramos called back to f/u. Please advise.

## 2023-06-27 NOTE — Telephone Encounter (Signed)
Verbal given 

## 2023-06-29 DIAGNOSIS — E7800 Pure hypercholesterolemia, unspecified: Secondary | ICD-10-CM | POA: Diagnosis not present

## 2023-06-29 DIAGNOSIS — K5900 Constipation, unspecified: Secondary | ICD-10-CM | POA: Diagnosis not present

## 2023-06-29 DIAGNOSIS — I10 Essential (primary) hypertension: Secondary | ICD-10-CM | POA: Diagnosis not present

## 2023-06-29 DIAGNOSIS — G3184 Mild cognitive impairment, so stated: Secondary | ICD-10-CM | POA: Diagnosis not present

## 2023-06-29 DIAGNOSIS — D72819 Decreased white blood cell count, unspecified: Secondary | ICD-10-CM | POA: Diagnosis not present

## 2023-06-29 DIAGNOSIS — R29898 Other symptoms and signs involving the musculoskeletal system: Secondary | ICD-10-CM | POA: Diagnosis not present

## 2023-07-03 ENCOUNTER — Telehealth: Payer: Self-pay | Admitting: Family Medicine

## 2023-07-03 NOTE — Telephone Encounter (Signed)
Caller/Agency: [facility] ([facility])  Callback Number: [phone]  Requesting OT/PT/Skilled Nursing/Social Work/Speech Therapy: PT Frequency: 1x for 4 weeks

## 2023-07-04 DIAGNOSIS — I10 Essential (primary) hypertension: Secondary | ICD-10-CM | POA: Diagnosis not present

## 2023-07-04 DIAGNOSIS — D72819 Decreased white blood cell count, unspecified: Secondary | ICD-10-CM | POA: Diagnosis not present

## 2023-07-04 DIAGNOSIS — G3184 Mild cognitive impairment, so stated: Secondary | ICD-10-CM | POA: Diagnosis not present

## 2023-07-04 DIAGNOSIS — R29898 Other symptoms and signs involving the musculoskeletal system: Secondary | ICD-10-CM | POA: Diagnosis not present

## 2023-07-04 DIAGNOSIS — E7800 Pure hypercholesterolemia, unspecified: Secondary | ICD-10-CM | POA: Diagnosis not present

## 2023-07-04 DIAGNOSIS — K5900 Constipation, unspecified: Secondary | ICD-10-CM | POA: Diagnosis not present

## 2023-07-04 NOTE — Telephone Encounter (Signed)
Verbal given 

## 2023-07-05 ENCOUNTER — Ambulatory Visit (INDEPENDENT_AMBULATORY_CARE_PROVIDER_SITE_OTHER): Payer: Medicare Other | Admitting: Physician Assistant

## 2023-07-05 ENCOUNTER — Encounter: Payer: Self-pay | Admitting: Physician Assistant

## 2023-07-05 VITALS — BP 168/87 | HR 105 | Resp 20 | Ht 71.0 in | Wt 167.0 lb

## 2023-07-05 DIAGNOSIS — E7800 Pure hypercholesterolemia, unspecified: Secondary | ICD-10-CM | POA: Diagnosis not present

## 2023-07-05 DIAGNOSIS — R29898 Other symptoms and signs involving the musculoskeletal system: Secondary | ICD-10-CM | POA: Diagnosis not present

## 2023-07-05 DIAGNOSIS — D72819 Decreased white blood cell count, unspecified: Secondary | ICD-10-CM | POA: Diagnosis not present

## 2023-07-05 DIAGNOSIS — G3184 Mild cognitive impairment, so stated: Secondary | ICD-10-CM

## 2023-07-05 DIAGNOSIS — I10 Essential (primary) hypertension: Secondary | ICD-10-CM | POA: Diagnosis not present

## 2023-07-05 DIAGNOSIS — K5900 Constipation, unspecified: Secondary | ICD-10-CM | POA: Diagnosis not present

## 2023-07-05 MED ORDER — DONEPEZIL HCL 23 MG PO TABS: 23.0000 mg | ORAL_TABLET | Freq: Every day | ORAL | 11 refills | Status: DC

## 2023-07-05 NOTE — Patient Instructions (Signed)
It was a pleasure to see you today at our office.   Recommendations:  Meds: Follow up 3 month  Increase  donepezil 23 mg daily.     RECOMMENDATIONS FOR ALL PATIENTS WITH MEMORY PROBLEMS: 1. Continue to exercise (Recommend 30 minutes of walking everyday, or 3 hours every week) 2. Increase social interactions - continue going to [place] and enjoy social gatherings with friends and family 3. Eat healthy, avoid fried foods and eat more fruits and vegetables 4. Maintain adequate blood pressure, blood sugar, and blood cholesterol level. Reducing the risk of stroke and cardiovascular disease also helps promoting better memory. 5. Avoid stressful situations. Live a simple life and avoid aggravations. Organize your time and prepare for the next day in anticipation. 6. Sleep well, avoid any interruptions of sleep and avoid any distractions in the bedroom that may interfere with adequate sleep quality 7. Avoid sugar, avoid sweets as there is a strong link between excessive sugar intake, diabetes, and cognitive impairment We discussed the Mediterranean diet, which has been shown to help patients reduce the risk of progressive memory disorders and reduces cardiovascular risk. This includes eating fish, eat fruits and green leafy vegetables, nuts like almonds and hazelnuts, walnuts, and also use olive oil. Avoid fast foods and fried foods as much as possible. Avoid sweets and sugar as sugar use has been linked to worsening of memory function.  There is always a concern of gradual progression of memory problems. If this is the case, then we may need to adjust level of care according to patient needs. Support, both to the patient and caregiver, should then be put into place.      FALL PRECAUTIONS: Be cautious when walking. Scan the area for obstacles that may increase the risk of trips and falls. When getting up in the mornings, sit up at the edge of the bed for a few minutes before getting out of bed.  Consider elevating the bed at the head end to avoid drop of blood pressure when getting up. Walk always in a well-lit room (use night lights in the walls). Avoid area rugs or power cords from appliances in the middle of the walkways. Use a walker or a cane if necessary and consider physical therapy for balance exercise. Get your eyesight checked regularly.   HOME SAFETY: Consider the safety of the kitchen when operating appliances like stoves, microwave oven, and blender. Consider having supervision and share cooking responsibilities until no longer able to participate in those. Accidents with firearms and other hazards in the house should be identified and addressed as well.   DRIVING: Regarding driving, in patients with progressive memory problems, driving will be impaired. We advise to have someone else do the driving if trouble finding directions or if minor accidents are reported. Independent driving assessment is available to determine safety of driving.       Mediterranean Diet A Mediterranean diet refers to food and lifestyle choices that are based on the traditions of countries located on the [employer]. This way of eating has been shown to help prevent certain conditions and improve outcomes for people who have chronic diseases, like kidney disease and heart disease. What are tips for following this plan? Lifestyle  Cook and eat meals together with your family, when possible. Drink enough fluid to keep your urine clear or pale yellow. Be physically active every day. This includes: Aerobic exercise like running or swimming. Leisure activities like gardening, walking, or housework. Get 7-8 hours of sleep each  night. If recommended by your health care provider, drink red wine in moderation. This means 1 glass a day for nonpregnant women and 2 glasses a day for men. A glass of wine equals 5 oz (150 mL). Reading food labels  Check the serving size of packaged foods. For foods  such as rice and pasta, the serving size refers to the amount of cooked product, not dry. Check the total fat in packaged foods. Avoid foods that have saturated fat or trans fats. Check the ingredients list for added sugars, such as corn syrup. Shopping  At the grocery store, buy most of your food from the areas near the walls of the store. This includes: Fresh fruits and vegetables (produce). Grains, beans, nuts, and seeds. Some of these may be available in unpackaged forms or large amounts (in bulk). Fresh seafood. Poultry and eggs. Low-fat dairy products. Buy whole ingredients instead of prepackaged foods. Buy fresh fruits and vegetables in-season from local farmers markets. Buy frozen fruits and vegetables in resealable bags. If you do not have access to quality fresh seafood, buy precooked frozen shrimp or canned fish, such as tuna, salmon, or sardines. Buy small amounts of raw or cooked vegetables, salads, or olives from the deli or salad bar at your store. Stock your pantry so you always have certain foods on hand, such as olive oil, canned tuna, canned tomatoes, rice, pasta, and beans. Cooking  Cook foods with extra-virgin olive oil instead of using butter or other vegetable oils. Have meat as a side dish, and have vegetables or grains as your main dish. This means having meat in small portions or adding small amounts of meat to foods like pasta or stew. Use beans or vegetables instead of meat in common dishes like chili or lasagna. Experiment with different cooking methods. Try roasting or broiling vegetables instead of steaming or sauteing them. Add frozen vegetables to soups, stews, pasta, or rice. Add nuts or seeds for added healthy fat at each meal. You can add these to yogurt, salads, or vegetable dishes. Marinate fish or vegetables using olive oil, lemon juice, garlic, and fresh herbs. Meal planning  Plan to eat 1 vegetarian meal one day each week. Try to work up to 2  vegetarian meals, if possible. Eat seafood 2 or more times a week. Have healthy snacks readily available, such as: Vegetable sticks with hummus. Greek yogurt. Fruit and nut trail mix. Eat balanced meals throughout the week. This includes: Fruit: 2-3 servings a day Vegetables: 4-5 servings a day Low-fat dairy: 2 servings a day Fish, poultry, or lean meat: 1 serving a day Beans and legumes: 2 or more servings a week Nuts and seeds: 1-2 servings a day Whole grains: 6-8 servings a day Extra-virgin olive oil: 3-4 servings a day Limit red meat and sweets to only a few servings a month What are my food choices? Mediterranean diet Recommended Grains: Whole-grain pasta. Brown rice. Bulgar wheat. Polenta. Couscous. Whole-wheat bread. Orpah Cobb. Vegetables: Artichokes. Beets. Broccoli. Cabbage. Carrots. Eggplant. Green beans. Chard. Kale. Spinach. Onions. Leeks. Peas. Squash. Tomatoes. Peppers. Radishes. Fruits: Apples. Apricots. Avocado. Berries. Bananas. Cherries. Dates. Figs. Grapes. Lemons. Melon. Oranges. Peaches. Plums. Pomegranate. Meats and other protein foods: Beans. Almonds. Sunflower seeds. Pine nuts. Peanuts. Cod. Salmon. Scallops. Shrimp. Tuna. Tilapia. Clams. Oysters. Eggs. Dairy: Low-fat milk. Cheese. Greek yogurt. Beverages: Water. Red wine. Herbal tea. Fats and oils: Extra virgin olive oil. Avocado oil. Grape seed oil. Sweets and desserts: [place] yogurt with honey. Baked apples. Poached pears.  Trail mix. Seasoning and other foods: Basil. Cilantro. Coriander. Cumin. Mint. Parsley. Sage. Rosemary. Tarragon. Garlic. Oregano. Thyme. Pepper. Balsalmic vinegar. Tahini. Hummus. Tomato sauce. Olives. Mushrooms. Limit these Grains: Prepackaged pasta or rice dishes. Prepackaged cereal with added sugar. Vegetables: Deep fried potatoes (french fries). Fruits: Fruit canned in syrup. Meats and other protein foods: Beef. Pork. Lamb. Poultry with skin. Hot dogs. Tomasa Blase. Dairy: Ice cream.  Sour cream. Whole milk. Beverages: Juice. Sugar-sweetened soft drinks. Beer. Liquor and spirits. Fats and oils: Butter. Canola oil. Vegetable oil. Beef fat (tallow). Lard. Sweets and desserts: Cookies. Cakes. Pies. Candy. Seasoning and other foods: Mayonnaise. Premade sauces and marinades. The items listed may not be a complete list. Talk with your dietitian about what dietary choices are right for you. Summary The Mediterranean diet includes both food and lifestyle choices. Eat a variety of fresh fruits and vegetables, beans, nuts, seeds, and whole grains. Limit the amount of red meat and sweets that you eat. Talk with your health care provider about whether it is safe for you to drink red wine in moderation. This means 1 glass a day for nonpregnant women and 2 glasses a day for men. A glass of wine equals 5 oz (150 mL). This information is not intended to replace advice given to you by your health care provider. Make sure you discuss any questions you have with your health care provider. Document Released: 04/28/2016 Document Revised: 05/31/2016 Document Reviewed: 04/28/2016 Elsevier Interactive Patient Education  2017 [employer].   .

## 2023-07-05 NOTE — Progress Notes (Signed)
Assessment/Plan:   Mild Cognitive Impairment  Andrew Ramos is a very pleasant 89 y.o. RH male with a history ofChronic myelomonocytic leukemia --intermediate 2  risk on observation (BMBx 11/06/20), HOH, hypertension, hyperlipidemia, diabetes mellitus, hypothyroidism, prostate cancer, melanoma, polymyalgia rheumatica, situational depression, recent cataract surgery and Mild Cognitive Impairment likely of vascular etiology presenting today in follow-up for evaluation of memory loss. Patient is on donepezil 10 mg daily. Memory is slightly worse. He is dealing with some stress due to marital issues, living arrangements. He lives in [place] and wife does not want to move to ALF which adds to friction. He is on Effexor 75 mg by [place], likely to increase dose per PCP.      Recommendations:   Follow up in 3  months. Increase  donepezil to 23 mg daily. Side effects were discussed  Repeat neurocognitive testing for clarity of diagnosis and disease trajectory.   Recommend good control of cardiovascular risk factors. Patient informed of abnormal BP readings  Continue to control mood as per PCP Recommend use of hearing aids for improved comprehension.    Subjective:   This patient is accompanied in the office by son and caregiver  who supplements the history. Previous records as well as any outside records available were reviewed prior to todays visit.   Patient was last seen on 07/12/22 with MMSE 28/30      Any changes in memory since last visit? "Slowing down". Patient has some difficulty remembering recent conversations and people names. His son states that he cannot complete sentences. LTM is good repeats oneself?  Endorsed Disoriented when walking into a room?  Patient denies  Leaving objects in unusual places?  Patient denies   Wandering behavior?  denies   Any personality changes since last visit? He feels more frustrated with his wife. His son feels that he should be on ALF but she  does not want to move. HE is more confused after lunch. If not under a schedule, he becomes more irritable.   Any worsening depression?: He is on Effexor 75 mg daily, to see his PCP at the [place], possible to increase dose.   Hallucinations or paranoia?  denies   Seizures?   denies    Any sleep changes? Sleeps well. Denies vivid dreams, REM behavior or sleepwalking   Sleep apnea?   denies   Any hygiene concerns?   denies   Independent of bathing and dressing?  Endorsed  Does the patient needs help with medications? Son is in charge  Who is in charge of the finances?  Son is in charge, uses auto pay    Any changes in appetite?  denies    Patient have trouble swallowing?  Denies. Takes Ensure tid as well.   Does the patient cook? No[place] food for [place] and [place]    Any headaches?    denies   Vision changes? Recent cataract surgery Chronic pain?  denies   Ambulates with difficulty? He feels that he may be "hunching more"  uses a cane for stability. Walks daily as tolerated  Recent falls or head injuries?    denies      Unilateral weakness, numbness or tingling?   denies   Any tremors?  denies   Any anosmia?    denies   Any incontinence of urine?  denies   Any bowel dysfunction?  denies      Patient [facility] his [facility])     Does the  patient drive? No longer drives      Initial Assessment 09/23/2016: This is a pleasant 89 yo RH man with a history of hypertension, hyperlipidemia, prostate cancer, melanoma, presenting for evaluation of worsening memory and personality changes. Andrew Ramos himself thinks that he is doing okay, but "people keep telling me I have too many things to think about." He only mostly notices memory problems when he is in a hurry. He states he occasionally forgets conversations but feels this is due to his poor hearing. He denies getting lost driving, no missed medications. He has missed some bill payments a couple of years ago, none recently. His son states  he is not nearly as sharp as he once was, he misplaces things frequently and asks the same questions repeatedly. He relates what the patient's wife has told him, that he is more argumentative. He has left the stove burner on at least twice in the past couple of weeks. He has walked away from the sink running. They have noticed more dents on the car, which the patient is unsure how they happened. They both wonder if these were from parking lot incidents because he states he has never hit another car. No difficulties with dressing/bathing independently, hygiene is good. On review of PCP notes where wife was present, she reported he forgets to zip his pants, he forgets things he has offered his wife. He brings the wrong utensils to eat with. He forgets simple things his wife asks for. He states that she give him too many instructions. He loses temper with his wife frequently, they are fighting more and she is tired of people telling her to be more patient with him. He was noted to have a blunt affect with paranoid thought content on his PCP visit. MMSE in 06/2016 was 30/30. He was started on Donepezil 5mg  daily, which he is tolerating without side effects.  He denies any headaches, dizziness, diplopia, dysarthria, dysphagia, neck/back pain, focal numbness/tingling/weakness, bladder dysfunction. No anosmia, tremors, no falls. He has occasional constipation. His brother who is 7 years younger than him was diagnosed with dementia after he got lost driving and could not be found for 2 weeks. He denies any significant head injuries, he drinks alcohol very occasionally.    Diagnostic Data: MRI brain with and without contrast did not show any acute changes. There was moderate diffuse volume loss and mild chronic microvascular disease.   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 Results and level of functioning did not  warrant a diagnosis of dementia. No signs of a primary psychiatric disorder. Increased irritability/mood changes are likely related to MCI and frontal-subcortical involvement.   Past Medical History:  Diagnosis Date   Chronic myelomonocytic leukemia (HCC) 11/25/2020   Colon polyps    Tubular Adenoma 2005   Diverticulosis    Goals of care, counseling/discussion 11/25/2020   Hepatitis    [facility] has hepatitis antibodies in blood    Hyperlipidemia    Hypertension    Low grade myelodysplastic syndrome lesions (HCC) 10/26/2020   Melanoma (HCC)    Nonmelanoma skin cancer    Osteoarthritis    right hip- none since hip replacement   Polymyalgia rheumatica (HCC)    Polyposis of colon    Prostate cancer (HCC) 2007   s/p radiation seed implants   Prostate cancer [facility])    Thyroid disease sees dr altzhimer for yearly   nodules  Past Surgical History:  Procedure Laterality Date   CATARACT EXTRACTION     POLYPECTOMY     TONSILLECTOMY  as child   TOTAL HIP ARTHROPLASTY  08/27/2008   left   TOTAL HIP ARTHROPLASTY  06/12/2012   Procedure: TOTAL HIP ARTHROPLASTY ANTERIOR APPROACH;  Surgeon: Shelda Pal, MD;  Location: [facility];  Service: Orthopedics;  Laterality: Right;   TOTAL KNEE ARTHROPLASTY  12/31/2008   left     PREVIOUS MEDICATIONS:   CURRENT MEDICATIONS:  Outpatient Encounter Medications as of 07/05/2023  Medication Sig   atorvastatin (LIPITOR) 40 MG tablet TAKE 1 TABLET DAILY   Calcium Carbonate-Vitamin D (CALCIUM 600 + D PO) Take 1 tablet by mouth every morning.    Cholecalciferol (VITAMIN D3) 1000 UNITS tablet Take 1,000 Units by mouth every morning.   docusate sodium (COLACE) 100 MG capsule Take 200 mg by mouth daily as needed for mild constipation.   fluticasone (FLONASE) 50 MCG/ACT nasal spray Place 2 sprays into both nostrils daily.   hydroxychloroquine (PLAQUENIL) 200 MG tablet Take 1 tablet by mouth 2 (two) times daily.   Iron, Ferrous Sulfate, 325 (65 Fe) MG  TABS Take 325 mg by mouth daily.   lisinopril-hydrochlorothiazide (ZESTORETIC) 10-12.5 MG tablet TAKE 1 TABLET DAILY   loratadine (CLARITIN) 10 MG tablet TAKE 1 TABLET DAILY   Multiple Vitamins-Minerals (CENTRUM SILVER ULTRA MENS) TABS Take 1 tablet by mouth every evening.   terazosin (HYTRIN) 1 MG capsule TAKE 1 CAPSULE AT BEDTIME   venlafaxine XR (EFFEXOR-XR) 75 MG 24 hr capsule TAKE 1 CAPSULE DAILY WITH BREAKFAST   [DISCONTINUED] donepezil (ARICEPT) 10 MG tablet TAKE 1 TABLET AT BEDTIME   donepezil (ARICEPT) 23 MG TABS tablet Take 1 tablet (23 mg total) by mouth daily.   [DISCONTINUED] donepezil (ARICEPT) 23 MG TABS tablet Take 1 tablet (23 mg total) by mouth at bedtime.   No facility-administered encounter medications on file as of 07/05/2023.     Objective:     PHYSICAL EXAMINATION:    VITALS:   Vitals:   07/05/23 0907 07/05/23 0956  BP: (!) 204/117 (!) 168/87  Pulse: (!) 105   Resp: 20   SpO2: 97%   Weight: 167 lb (75.8 kg)   Height: 5\' 11"  (1.803 m)     GEN:  The patient appears stated age and is in NAD. HEENT:  Normocephalic, atraumatic.   Neurological examination:  General: NAD, well-groomed, appears stated age. Orientation: The patient is alert. Oriented to person, place and date Cranial nerves: There is good facial symmetry.The speech is fluent and clear. No aphasia or dysarthria. Fund of knowledge is appropriate. Recent memory impaired and remote memory is normal.  Attention and concentration are normal.  Able to name objects and repeat phrases.  Hearing is very decreased conversational tone   Delayed recall 0/3 Sensation: Sensation is intact to light touch throughout Motor: Strength is at least antigravity x4. DTR's 2/4 in UE/LE      10/08/2019    3:00 PM 03/01/2019    9:00 AM 09/23/2016    2:00 PM  Montreal Cognitive Assessment   Visuospatial/ Executive (0/5)   5  Naming (0/3)   3  Attention: Read list of digits (0/2) 2 2 2   Attention: Read list of letters  (0/1) 1 1 1   Attention: Serial 7 subtraction starting at 100 (0/3) 3 3 3   Language: Repeat phrase (0/2) 0 0 2  Language : Fluency (0/1) 0 0 0  Abstraction (0/2) 1 0 2  Delayed Recall (0/5) 5 0 2  Orientation (0/6) 6 6 6   Total   26       07/05/2023   12:00 PM 12/29/2021   12:00 PM 09/25/2017    9:00 AM  MMSE - Mini Mental State Exam  Orientation to time 5 5 5   Orientation to Place 5 5 5   Registration 3 3 3   Attention/ Calculation 4 5 5   Recall 0 3 2  Language- name 2 objects 2 2 2   Language- repeat 1 1 1   Language- follow 3 step command 3 3 3   Language- read & follow direction 1 1 1   Write a sentence 1 1 1   Copy design 1 0 1  Total score 26 29 29        Movement examination: Tone: There is normal tone in the UE/LE Abnormal movements:  no tremor.  No myoclonus.  No asterixis.   Coordination:  There is no decremation with RAM's. Normal finger to nose  Gait and Station: The patient has some difficulty arising out of a deep-seated chair without the use of the hands. The patient's stride length is shorter, flexes forward .  Gait is cautious and narrow.   Thank you for allowing [place] the opportunity to participate in the care of this nice patient. Please do not hesitate to contact us for any questions or concerns.   Total time spent on today's visit was 34 minutes dedicated to this patient today, preparing to see patient, examining the patient, ordering tests and/or medications and counseling the patient, documenting clinical information in the EHR or other health record, independently interpreting results and communicating results to the patient/family, discussing treatment and goals, answering patient's questions and coordinating care.  Cc:  Fredric Dine [facility] 07/05/2023 12:48 PM

## 2023-07-06 ENCOUNTER — Encounter: Payer: Self-pay | Admitting: Family Medicine

## 2023-07-06 ENCOUNTER — Ambulatory Visit: Payer: Medicare Other | Admitting: Family Medicine

## 2023-07-06 VITALS — BP 160/90 | HR 83 | Temp 98.7°F | Resp 18 | Ht 71.0 in | Wt 167.4 lb

## 2023-07-06 DIAGNOSIS — F418 Other specified anxiety disorders: Secondary | ICD-10-CM | POA: Diagnosis not present

## 2023-07-06 DIAGNOSIS — E1151 Type 2 diabetes mellitus with diabetic peripheral angiopathy without gangrene: Secondary | ICD-10-CM | POA: Diagnosis not present

## 2023-07-06 DIAGNOSIS — I10 Essential (primary) hypertension: Secondary | ICD-10-CM

## 2023-07-06 DIAGNOSIS — E785 Hyperlipidemia, unspecified: Secondary | ICD-10-CM | POA: Diagnosis not present

## 2023-07-06 DIAGNOSIS — E1169 Type 2 diabetes mellitus with other specified complication: Secondary | ICD-10-CM | POA: Diagnosis not present

## 2023-07-06 MED ORDER — LISINOPRIL-HYDROCHLOROTHIAZIDE 20-12.5 MG PO TABS
1.0000 | ORAL_TABLET | Freq: Every day | ORAL | 3 refills | Status: DC
Start: 2023-07-06 — End: 2023-09-11

## 2023-07-06 MED ORDER — VENLAFAXINE HCL ER 150 MG PO CP24
150.0000 mg | ORAL_CAPSULE | Freq: Every day | ORAL | 3 refills | Status: DC
Start: 2023-07-06 — End: 2023-09-11

## 2023-07-06 MED ORDER — LISINOPRIL-HYDROCHLOROTHIAZIDE 20-12.5 MG PO TABS
1.0000 | ORAL_TABLET | Freq: Every day | ORAL | 3 refills | Status: DC
Start: 2023-07-06 — End: 2023-07-06

## 2023-07-06 MED ORDER — ATORVASTATIN CALCIUM 40 MG PO TABS
40.0000 mg | ORAL_TABLET | Freq: Every day | ORAL | 3 refills | Status: DC
Start: 2023-07-06 — End: 2023-09-11

## 2023-07-06 NOTE — Patient Instructions (Signed)

## 2023-07-06 NOTE — Progress Notes (Signed)
Established Patient Office Visit  Subjective   Patient ID: Andrew Ramos, male    DOB: 08-26-1932  Age: 89 y.o. MRN: [id number]  Chief Complaint  Patient presents with   Hypertension    Pt reports having high bp at neuro. Pt states he has taken his bp medication today and would like to discuss increasing Effexor     HPI Discussed the use of AI scribe software for clinical note transcription with the patient, who gave verbal consent to proceed.  History of Present Illness   The patient, with a history of hypertension, presents with a recent increase in blood pressure. He reports that his blood pressure was "out of sight" and remained high despite taking his prescribed medication regularly. The patient's spouse confirms the regular intake of the medication. The patient also reports a significant weight loss, from 230 pounds to 167 pounds, achieved through dietary changes and reduced meal frequency.  In addition to hypertension, the patient has been experiencing increased agitation. The spouse reports that the patient's agitation level is high, particularly in crowded situations. The patient is currently on a low dose of Effexor for this condition.  The patient also reports hearing problems, which he attributes to wax build-up in his ears. He receives hearing aids from the [place] and periodically uses a solution to help dissipate the wax. The patient's spouse notes that the patient's hearing has been worsening.  The patient's spouse also mentions a decrease in the patient's memory over the past year, which is a concern. The patient is currently on Aricept, and the dose has recently been increased to slow down memory loss.      Patient Active Problem List   Diagnosis Date Noted   Vascular dementia without behavioral disturbance (HCC) 06/29/2021   Antineoplastic chemotherapy induced pancytopenia (CODE) (HCC) 06/07/2021   DM (diabetes mellitus) type II, controlled, with peripheral vascular  disorder (HCC) 06/07/2021   Paroxysmal supraventricular tachycardia (HCC) 06/07/2021   Iron deficiency anemia 03/14/2021   Polymyalgia rheumatica (HCC)    Osteoarthritis    Thyroid disease    Chronic myelomonocytic leukemia (HCC) 11/25/2020   Goals of care, counseling/discussion 11/25/2020   Low grade myelodysplastic syndrome lesions (HCC) 10/26/2020   Uncontrolled type 2 diabetes mellitus with hyperglycemia (HCC) 03/16/2020   Fatigue 03/16/2020   Rib pain on left side 05/27/2017   Closed left hip fracture (HCC) 09/29/2016   Fall    Periprosthetic fracture around internal prosthetic left hip joint (HCC)    Osteoporosis    Benign prostatic hyperplasia without lower urinary tract symptoms    Depression    Mild cognitive impairment 09/23/2016   Nuclear sclerotic cataract of left eye 04/02/2016   Cortical age-related cataract of both eyes 01/04/2016   Degenerative retinal drusen of both eyes 01/04/2016   Nuclear sclerotic cataract of both eyes 01/04/2016   Open angle with borderline findings and low glaucoma risk in both eyes 01/04/2016   Posterior vitreous detachment of both eyes 01/04/2016   Maxillary sinusitis, acute 11/03/2015   Diet-controlled diabetes mellitus (HCC) 04/06/2015   Acute upper respiratory infection 11/11/2014   Prostate cancer (HCC)    Melanoma (HCC)    Nonmelanoma skin cancer    Polyposis of colon    Influenza-like illness 09/30/2013   Obesity (BMI 30-39.9) 05/10/2013   Obese 06/14/2012   S/P right THA, AA 06/12/2012   PMR (polymyalgia rheumatica) (HCC) 05/10/2011   External hemorrhoids 06/09/2010   Diverticulosis of colon 06/09/2010   History of colonic polyps  06/09/2010   Vitamin D deficiency 05/18/2010   TOTAL KNEE REPLACEMENT, LEFT, HX OF 01/22/2009   Hearing loss 03/13/2008   Thyroid nodule 08/14/2007   PROSTATE CANCER, HX OF 06/18/2007   Hyperlipidemia 02/28/2007   Essential hypertension 02/28/2007   Osteoarthritis 02/28/2007   Past Medical  History:  Diagnosis Date   Chronic myelomonocytic leukemia (HCC) 11/25/2020   Colon polyps    Tubular Adenoma 2005   Diverticulosis    Goals of care, counseling/discussion 11/25/2020   Hepatitis    [facility] has hepatitis antibodies in blood    Hyperlipidemia    Hypertension    Low grade myelodysplastic syndrome lesions (HCC) 10/26/2020   Melanoma (HCC)    Nonmelanoma skin cancer    Osteoarthritis    right hip- none since hip replacement   Polymyalgia rheumatica (HCC)    Polyposis of colon    Prostate cancer (HCC) 2007   s/p radiation seed implants   Prostate cancer [facility])    Thyroid disease sees dr altzhimer for yearly   nodules   Past Surgical History:  Procedure Laterality Date   CATARACT EXTRACTION     POLYPECTOMY     TONSILLECTOMY  as child   TOTAL HIP ARTHROPLASTY  08/27/2008   left   TOTAL HIP ARTHROPLASTY  06/12/2012   Procedure: TOTAL HIP ARTHROPLASTY ANTERIOR APPROACH;  Surgeon: Shelda Pal, MD;  Location: [facility];  Service: Orthopedics;  Laterality: Right;   TOTAL KNEE ARTHROPLASTY  12/31/2008   left   Social History   Tobacco Use   Smoking status: Never   Smokeless tobacco: Never  Vaping Use   Vaping status: Never Used  Substance Use Topics   Alcohol use: Yes    Alcohol/week: 0.0 standard drinks of alcohol    Comment: occasional   Drug use: No   Social History   Socioeconomic History   Marital status: Married    Spouse name: Not on file   Number of children: 2   Years of education: Not on file   Highest education level: Not on file  Occupational History   Occupation: retired Hydrologist: RETIRED  Tobacco Use   Smoking status: Never   Smokeless tobacco: Never  Vaping Use   Vaping status: Never Used  Substance and Sexual Activity   Alcohol use: Yes    Alcohol/week: 0.0 standard drinks of alcohol    Comment: occasional   Drug use: No   Sexual activity: Not Currently    Partners: Female  Other Topics Concern   Not on file   Social History Narrative   Exercise--no   Right handed    Hotel manager   Social Determinants of Health   Financial Resource Strain: Not on file  Food Insecurity: Not on file  Transportation Needs: Not on file  Physical Activity: Not on file  Stress: Not on file  Social Connections: Not on file  Intimate Partner Violence: Not on file   Family Status  Relation Name Status   Mother  Deceased at age 53       MI   Father  Deceased at age 12       pancreatic ca   Brother  Deceased at age 82       pancreatic cancer   Neg Hx  (Not Specified)  No partnership data on file   Family History  Problem Relation Age of Onset   Coronary artery disease Mother 93   Heart disease Mother 36  MI   Coronary artery disease Father 62   Pancreatic cancer Father 76       deceased 20   Pancreatic cancer Brother 43       deceased 28; smoker   Colon cancer Neg Hx    Colon polyps Neg Hx    Esophageal cancer Neg Hx    Kidney disease Neg Hx    Diabetes Neg Hx    Gallbladder disease Neg Hx    No Known Allergies    Review of Systems  Constitutional:  Negative for fever and malaise/fatigue.  HENT:  Negative for congestion.   Eyes:  Negative for blurred vision.  Respiratory:  Negative for shortness of breath.   Cardiovascular:  Negative for chest pain, palpitations and leg swelling.  Gastrointestinal:  Negative for abdominal pain, blood in stool and nausea.  Genitourinary:  Negative for dysuria and frequency.  Musculoskeletal:  Negative for falls.  Skin:  Negative for rash.  Neurological:  Negative for dizziness, loss of consciousness and headaches.  Endo/Heme/Allergies:  Negative for environmental allergies.  Psychiatric/Behavioral:  Negative for depression. The patient is not nervous/anxious.       Objective:     BP (!) 160/90 (BP Location: Left Arm, Patient Position: Sitting, Cuff Size: Normal)   Pulse 83   Temp 98.7 F (37.1 C) (Oral)   Resp 18   Ht 5\' 11"  (1.803 m)   Wt 167  lb 6.4 oz (75.9 kg)   SpO2 97%   BMI 23.35 kg/m  BP Readings from Last 3 Encounters:  07/06/23 (!) 160/90  07/05/23 (!) 168/87  05/29/23 (!) 140/80   Wt Readings from Last 3 Encounters:  07/06/23 167 lb 6.4 oz (75.9 kg)  07/05/23 167 lb (75.8 kg)  05/29/23 171 lb 9.6 oz (77.8 kg)   SpO2 Readings from Last 3 Encounters:  07/06/23 97%  07/05/23 97%  05/29/23 99%      Physical Exam Vitals and nursing note reviewed.  Constitutional:      General: He is not in acute distress.    Appearance: Normal appearance. He is well-developed.  HENT:     Head: Normocephalic and atraumatic.  Eyes:     General: No scleral icterus.       Right eye: No discharge.        Left eye: No discharge.  Cardiovascular:     Rate and Rhythm: Normal rate and regular rhythm.     Heart sounds: No murmur heard. Pulmonary:     Effort: Pulmonary effort is normal. No respiratory distress.     Breath sounds: Normal breath sounds.  Musculoskeletal:        General: Normal range of motion.     Cervical back: Normal range of motion and neck supple.     Right lower leg: No edema.     Left lower leg: No edema.  Skin:    General: Skin is warm and dry.  Neurological:     Mental Status: He is alert and oriented to person, place, and time.  Psychiatric:        Mood and Affect: Mood normal.        Behavior: Behavior normal.        Thought Content: Thought content normal.        Judgment: Judgment normal.      No results found for any visits on 07/06/23.  Last CBC Lab Results  Component Value Date   WBC 6.2 05/31/2023   HGB 10.5 (L) 05/31/2023  HCT 32.1 (L) 05/31/2023   MCV 103.8 (H) 05/31/2023   MCH 34.6 (H) 05/08/2023   RDW 16.7 (H) 05/31/2023   PLT 109.0 (L) 05/31/2023   Last metabolic panel Lab Results  Component Value Date   GLUCOSE 92 05/31/2023   NA 137 05/31/2023   K 3.8 05/31/2023   CL 103 05/31/2023   CO2 26 05/31/2023   BUN 20 05/31/2023   CREATININE 1.25 05/31/2023   GFR 51.11  (L) 05/31/2023   CALCIUM 9.8 05/31/2023   PROT 7.3 05/31/2023   ALBUMIN 4.1 05/31/2023   BILITOT 0.7 05/31/2023   ALKPHOS 70 05/31/2023   AST 10 05/31/2023   ALT 7 05/31/2023   ANIONGAP 8 05/08/2023   Last lipids Lab Results  Component Value Date   CHOL 126 05/31/2023   HDL 52.80 05/31/2023   LDLCALC 60 05/31/2023   TRIG 65.0 05/31/2023   CHOLHDL 2 05/31/2023   Last hemoglobin A1c Lab Results  Component Value Date   HGBA1C 5.6 05/31/2023   Last thyroid functions Lab Results  Component Value Date   TSH 1.99 05/31/2023   Last vitamin D Lab Results  Component Value Date   VD25OH 51 06/04/2020   Last vitamin B12 and Folate Lab Results  Component Value Date   VITAMINB12 618 06/04/2020      The ASCVD Risk score (Arnett DK, et al., 2019) failed to calculate for the following reasons:   The 2019 ASCVD risk score is only valid for ages 27 to 15    Assessment & Plan:   Problem List Items Addressed This Visit       Unprioritized   DM (diabetes mellitus) type II, controlled, with peripheral vascular disorder (HCC)   Relevant Medications   atorvastatin (LIPITOR) 40 MG tablet   lisinopril-hydrochlorothiazide (ZESTORETIC) 20-12.5 MG tablet   Other Visit Diagnoses     Hyperlipidemia associated with type 2 diabetes mellitus (HCC)    -  Primary   Relevant Medications   atorvastatin (LIPITOR) 40 MG tablet   lisinopril-hydrochlorothiazide (ZESTORETIC) 20-12.5 MG tablet   Primary hypertension       Relevant Medications   atorvastatin (LIPITOR) 40 MG tablet   lisinopril-hydrochlorothiazide (ZESTORETIC) 20-12.5 MG tablet   Depression with anxiety       Relevant Medications   venlafaxine XR (EFFEXOR XR) 150 MG 24 hr capsule     Assessment and Plan    Hypertension Elevated blood pressure despite reported adherence to current antihypertensive medication. Discussed the need to increase the dose of Lisinopril due to persistent hypertension. -Increase Lisinopril to  20/12.5mg  daily. -Check blood pressure in 2-3 weeks.  Agitation Increased agitation reported. Currently on Effexor. -Increase Effexor to 150mg  daily.  Memory Loss Noted decrease in memory over the past year. Currently on Aricept. -Increase Aricept to 23mg  daily.  General Health Maintenance -Encouraged to continue healthy diet and physical activity. -Bring home blood pressure monitor to next appointment for accuracy check.        Return in about 3 weeks (around 07/27/2023), or if symptoms worsen or fail to improve, for hypertension.    Donato Schultz, DO

## 2023-07-10 ENCOUNTER — Inpatient Hospital Stay: Payer: Medicare Other | Attending: Hematology & Oncology

## 2023-07-10 ENCOUNTER — Encounter: Payer: Self-pay | Admitting: Medical Oncology

## 2023-07-10 ENCOUNTER — Inpatient Hospital Stay (HOSPITAL_BASED_OUTPATIENT_CLINIC_OR_DEPARTMENT_OTHER): Payer: Medicare Other | Admitting: Medical Oncology

## 2023-07-10 ENCOUNTER — Inpatient Hospital Stay: Payer: Medicare Other

## 2023-07-10 VITALS — BP 172/91 | HR 91 | Temp 97.6°F | Resp 20 | Ht 71.0 in | Wt 165.0 lb

## 2023-07-10 VITALS — BP 158/85 | HR 88

## 2023-07-10 DIAGNOSIS — C9310 Chronic myelomonocytic leukemia not having achieved remission: Secondary | ICD-10-CM

## 2023-07-10 DIAGNOSIS — D4620 Refractory anemia with excess of blasts, unspecified: Secondary | ICD-10-CM | POA: Insufficient documentation

## 2023-07-10 DIAGNOSIS — R29898 Other symptoms and signs involving the musculoskeletal system: Secondary | ICD-10-CM | POA: Diagnosis not present

## 2023-07-10 DIAGNOSIS — D509 Iron deficiency anemia, unspecified: Secondary | ICD-10-CM

## 2023-07-10 DIAGNOSIS — G3184 Mild cognitive impairment, so stated: Secondary | ICD-10-CM | POA: Diagnosis not present

## 2023-07-10 DIAGNOSIS — I10 Essential (primary) hypertension: Secondary | ICD-10-CM | POA: Diagnosis not present

## 2023-07-10 DIAGNOSIS — D72819 Decreased white blood cell count, unspecified: Secondary | ICD-10-CM | POA: Diagnosis not present

## 2023-07-10 DIAGNOSIS — C9311 Chronic myelomonocytic leukemia, in remission: Secondary | ICD-10-CM

## 2023-07-10 DIAGNOSIS — E7800 Pure hypercholesterolemia, unspecified: Secondary | ICD-10-CM | POA: Diagnosis not present

## 2023-07-10 DIAGNOSIS — R718 Other abnormality of red blood cells: Secondary | ICD-10-CM

## 2023-07-10 DIAGNOSIS — K5900 Constipation, unspecified: Secondary | ICD-10-CM | POA: Diagnosis not present

## 2023-07-10 LAB — CMP (CANCER CENTER ONLY)
ALT: 6 U/L (ref 0–44)
AST: 12 U/L — ABNORMAL LOW (ref 15–41)
Albumin: 4.7 g/dL (ref 3.5–5.0)
Alkaline Phosphatase: 63 U/L (ref 38–126)
Anion gap: 11 (ref 5–15)
BUN: 18 mg/dL (ref 8–23)
CO2: 26 mmol/L (ref 22–32)
Calcium: 10 mg/dL (ref 8.9–10.3)
Chloride: 104 mmol/L (ref 98–111)
Creatinine: 1.38 mg/dL — ABNORMAL HIGH (ref 0.61–1.24)
GFR, Estimated: 49 mL/min — ABNORMAL LOW (ref >60)
Glucose, Bld: 99 mg/dL (ref 70–99)
Potassium: 4.2 mmol/L (ref 3.5–5.1)
Sodium: 141 mmol/L (ref 135–145)
Total Bilirubin: 0.5 mg/dL (ref 0.3–1.2)
Total Protein: 7.4 g/dL (ref 6.5–8.1)

## 2023-07-10 LAB — CBC WITH DIFFERENTIAL (CANCER CENTER ONLY)
Abs Immature Granulocytes: 0.02 10*3/uL (ref 0.00–0.07)
Basophils Absolute: 0 10*3/uL (ref 0.0–0.1)
Basophils Relative: 0 %
Eosinophils Absolute: 0 10*3/uL (ref 0.0–0.5)
Eosinophils Relative: 0 %
HCT: 31.9 % — ABNORMAL LOW (ref 39.0–52.0)
Hemoglobin: 10.4 g/dL — ABNORMAL LOW (ref 13.0–17.0)
Immature Granulocytes: 0 %
Lymphocytes Relative: 41 %
Lymphs Abs: 2.4 10*3/uL (ref 0.7–4.0)
MCH: 34.1 pg — ABNORMAL HIGH (ref 26.0–34.0)
MCHC: 32.6 g/dL (ref 30.0–36.0)
MCV: 104.6 fL — ABNORMAL HIGH (ref 80.0–100.0)
Monocytes Absolute: 2.8 10*3/uL — ABNORMAL HIGH (ref 0.1–1.0)
Monocytes Relative: 48 %
Neutro Abs: 0.6 10*3/uL — ABNORMAL LOW (ref 1.7–7.7)
Neutrophils Relative %: 11 %
Platelet Count: 110 10*3/uL — ABNORMAL LOW (ref 150–400)
RBC: 3.05 MIL/uL — ABNORMAL LOW (ref 4.22–5.81)
RDW: 16.1 % — ABNORMAL HIGH (ref 11.5–15.5)
WBC Count: 5.8 10*3/uL (ref 4.0–10.5)
nRBC: 0 % (ref 0.0–0.2)

## 2023-07-10 MED ORDER — DARBEPOETIN ALFA 300 MCG/0.6ML IJ SOSY
300.0000 ug | PREFILLED_SYRINGE | Freq: Once | INTRAMUSCULAR | Status: AC
  Administered 2023-07-10: 300 ug via SUBCUTANEOUS
  Filled 2023-07-10: qty 0.6

## 2023-07-10 NOTE — Progress Notes (Signed)
Hematology and Oncology Follow Up Visit  Andrew Ramos [id number] 1931-10-01 89 y.o. 07/10/2023   Principle Diagnosis:  Chronic myelomonocytic leukemia --intermediate 2  risk   Current Therapy:        Aranesp 300 mcg sq q 3 week for Hgb <11 IV Iron as indicated    Interim History:  Andrew Ramos is here today for follow-up and consideration of ESA injection.  He states that he is doing well. He is frustrated that he had to wait so long today. BP is a bit elevated secondarily to this. Improved with recheck Has chronic fatigue but otherwise has no complaints.  No blood loss noted. No bruising or petechiae. No fever, chills, n/v, cough, rash, dizziness, SOB, chest pain, palpitations, abdominal pain or changes in bowel or bladder habits.  No swelling in his extremities.  No falls or syncope. He is ambulating with a cane for added support.   He states that he is eating healthy and drinking lots of ice water.  Wt Readings from Last 3 Encounters:  07/06/23 167 lb 6.4 oz (75.9 kg)  07/05/23 167 lb (75.8 kg)  05/29/23 171 lb 9.6 oz (77.8 kg)     ECOG Performance Status: 1 - Symptomatic but completely ambulatory  Medications:  Allergies as of 07/10/2023   No Known Allergies      Medication List        Accurate as of July 10, 2023 12:23 PM. If you have any questions, ask your nurse or doctor.          atorvastatin 40 MG tablet Commonly known as: LIPITOR Take 1 tablet (40 mg total) by mouth daily.   CALCIUM 600 + D PO Take 1 tablet by mouth every morning.   Centrum Silver Ultra Mens Tabs Take 1 tablet by mouth every evening.   cholecalciferol 25 MCG (1000 UNIT) tablet Commonly known as: VITAMIN D3 Take 1,000 Units by mouth every morning.   docusate sodium 100 MG capsule Commonly known as: COLACE Take 200 mg by mouth daily as needed for mild constipation.   donepezil 23 MG Tabs tablet Commonly known as: ARICEPT Take 1 tablet (23 mg total) by mouth daily.    fluticasone 50 MCG/ACT nasal spray Commonly known as: FLONASE Place 2 sprays into both nostrils daily.   hydroxychloroquine 200 MG tablet Commonly known as: PLAQUENIL Take 1 tablet by mouth 2 (two) times daily.   Iron (Ferrous Sulfate) 325 (65 Fe) MG Tabs Take 325 mg by mouth daily.   lisinopril-hydrochlorothiazide 20-12.5 MG tablet Commonly known as: Zestoretic Take 1 tablet by mouth daily.   loratadine 10 MG tablet Commonly known as: CLARITIN TAKE 1 TABLET DAILY   terazosin 1 MG capsule Commonly known as: HYTRIN TAKE 1 CAPSULE AT BEDTIME   venlafaxine XR 150 MG 24 hr capsule Commonly known as: Effexor XR Take 1 capsule (150 mg total) by mouth daily with breakfast.        Allergies: No Known Allergies  Past Medical History, Surgical history, Social history, and Family History were reviewed and updated.  Review of Systems: All other 10 point review of systems is negative.   Physical Exam:  vitals were not taken for this visit.   Wt Readings from Last 3 Encounters:  07/06/23 167 lb 6.4 oz (75.9 kg)  07/05/23 167 lb (75.8 kg)  05/29/23 171 lb 9.6 oz (77.8 kg)    Ocular: Sclerae unicteric, pupils equal, round and reactive to light Ear-nose-throat: Oropharynx clear, dentition fair Lymphatic: No cervical  or supraclavicular adenopathy Lungs no rales or rhonchi, good excursion bilaterally Heart regular rate and rhythm, no murmur appreciated Abd soft, nontender, positive bowel sounds MSK no focal spinal tenderness, no joint edema Neuro: non-focal, well-oriented, appropriate affect  Lab Results  Component Value Date   WBC 5.8 07/10/2023   HGB 10.4 (L) 07/10/2023   HCT 31.9 (L) 07/10/2023   MCV 104.6 (H) 07/10/2023   PLT 110 (L) 07/10/2023   Lab Results  Component Value Date   FERRITIN 54 05/08/2023   IRON 93 05/08/2023   TIBC 339 05/08/2023   UIBC 246 05/08/2023   IRONPCTSAT 27 05/08/2023   Lab Results  Component Value Date   RETICCTPCT 1.2  05/08/2023   RBC 3.05 (L) 07/10/2023   No results found for: "KPAFRELGTCHN", "LAMBDASER", "KAPLAMBRATIO" No results found for: "IGGSERUM", "IGA", "IGMSERUM" No results found for: "TOTALPROTELP", "ALBUMINELP", "A1GS", "A2GS", "BETS", "BETA2SER", "GAMS", "MSPIKE", "SPEI"   Chemistry      Component Value Date/Time   NA 141 07/10/2023 1047   NA 144 02/26/2021 0000   K 4.2 07/10/2023 1047   CL 104 07/10/2023 1047   CO2 26 07/10/2023 1047   BUN 18 07/10/2023 1047   BUN 21 02/26/2021 0000   CREATININE 1.38 (H) 07/10/2023 1047   CREATININE 1.14 (H) 06/04/2020 1041   GLU 94 02/26/2021 0000      Component Value Date/Time   CALCIUM 10.0 07/10/2023 1047   ALKPHOS 63 07/10/2023 1047   AST 12 (L) 07/10/2023 1047   ALT 6 07/10/2023 1047   BILITOT 0.5 07/10/2023 1047     Encounter Diagnoses  Name Primary?   Low grade myelodysplastic syndrome lesions (HCC) Yes   Chronic myelomonocytic leukemia not having achieved remission (HCC)    Iron deficiency anemia, unspecified iron deficiency anemia type    Elevated MCV     Impression and Plan: Andrew Ramos is a very pleasant 89 yo gentleman with CML and multifactorial anemia. Hgb today is 10.4, MCV 104.6, platelets 110 and WBC count 5.8. Will obtain some more nutritional labs at follow up given his elevated MCV and platelet dysfunction.   ESA given, Hgb 10.4  Iron studies are pending.  Lab and injection every 3 weeks RTC 3 months MD, Labs ( CBC w/, CMP, iron, ferritin, folate, B12)-Andrew  Andrew Chestnut, PA-C 10/21/202412:23 PM

## 2023-07-10 NOTE — Patient Instructions (Signed)

## 2023-07-10 NOTE — Progress Notes (Signed)
BP remains elevated,, 172/91, Clent Jacks, PA aware.

## 2023-07-12 DIAGNOSIS — R29898 Other symptoms and signs involving the musculoskeletal system: Secondary | ICD-10-CM | POA: Diagnosis not present

## 2023-07-12 DIAGNOSIS — G3184 Mild cognitive impairment, so stated: Secondary | ICD-10-CM | POA: Diagnosis not present

## 2023-07-12 DIAGNOSIS — E7800 Pure hypercholesterolemia, unspecified: Secondary | ICD-10-CM | POA: Diagnosis not present

## 2023-07-12 DIAGNOSIS — D72819 Decreased white blood cell count, unspecified: Secondary | ICD-10-CM | POA: Diagnosis not present

## 2023-07-12 DIAGNOSIS — K5900 Constipation, unspecified: Secondary | ICD-10-CM | POA: Diagnosis not present

## 2023-07-12 DIAGNOSIS — I10 Essential (primary) hypertension: Secondary | ICD-10-CM | POA: Diagnosis not present

## 2023-07-20 ENCOUNTER — Telehealth: Payer: Self-pay | Admitting: Family Medicine

## 2023-07-20 DIAGNOSIS — K5900 Constipation, unspecified: Secondary | ICD-10-CM | POA: Diagnosis not present

## 2023-07-20 DIAGNOSIS — R29898 Other symptoms and signs involving the musculoskeletal system: Secondary | ICD-10-CM | POA: Diagnosis not present

## 2023-07-20 DIAGNOSIS — E7800 Pure hypercholesterolemia, unspecified: Secondary | ICD-10-CM | POA: Diagnosis not present

## 2023-07-20 DIAGNOSIS — G3184 Mild cognitive impairment, so stated: Secondary | ICD-10-CM | POA: Diagnosis not present

## 2023-07-20 DIAGNOSIS — D72819 Decreased white blood cell count, unspecified: Secondary | ICD-10-CM | POA: Diagnosis not present

## 2023-07-20 DIAGNOSIS — I10 Essential (primary) hypertension: Secondary | ICD-10-CM | POA: Diagnosis not present

## 2023-07-20 NOTE — Telephone Encounter (Signed)
Spoke w/ Sonal- verbal orders given

## 2023-07-20 NOTE — Telephone Encounter (Signed)
Sonal, speech therapist, with [facility] called and requested for verbal orders for the patient to start speech therapy with the frequency of 1 week for 4 weeks. As well as Physical Therapy for 2x/week for 3 weeks, and then, 1x/week for 3 weeks. Please call and advise at [phone].

## 2023-07-21 DIAGNOSIS — E7800 Pure hypercholesterolemia, unspecified: Secondary | ICD-10-CM | POA: Diagnosis not present

## 2023-07-21 DIAGNOSIS — I10 Essential (primary) hypertension: Secondary | ICD-10-CM | POA: Diagnosis not present

## 2023-07-21 DIAGNOSIS — G3184 Mild cognitive impairment, so stated: Secondary | ICD-10-CM | POA: Diagnosis not present

## 2023-07-21 DIAGNOSIS — D72819 Decreased white blood cell count, unspecified: Secondary | ICD-10-CM | POA: Diagnosis not present

## 2023-07-21 DIAGNOSIS — K5900 Constipation, unspecified: Secondary | ICD-10-CM | POA: Diagnosis not present

## 2023-07-21 DIAGNOSIS — R29898 Other symptoms and signs involving the musculoskeletal system: Secondary | ICD-10-CM | POA: Diagnosis not present

## 2023-07-25 ENCOUNTER — Ambulatory Visit (INDEPENDENT_AMBULATORY_CARE_PROVIDER_SITE_OTHER): Payer: Medicare Other

## 2023-07-25 VITALS — BP 140/82 | HR 89

## 2023-07-25 DIAGNOSIS — D72819 Decreased white blood cell count, unspecified: Secondary | ICD-10-CM | POA: Diagnosis not present

## 2023-07-25 DIAGNOSIS — K5900 Constipation, unspecified: Secondary | ICD-10-CM | POA: Diagnosis not present

## 2023-07-25 DIAGNOSIS — E1169 Type 2 diabetes mellitus with other specified complication: Secondary | ICD-10-CM | POA: Diagnosis not present

## 2023-07-25 DIAGNOSIS — E7800 Pure hypercholesterolemia, unspecified: Secondary | ICD-10-CM | POA: Diagnosis not present

## 2023-07-25 DIAGNOSIS — F0150 Vascular dementia without behavioral disturbance: Secondary | ICD-10-CM | POA: Diagnosis not present

## 2023-07-25 DIAGNOSIS — E1165 Type 2 diabetes mellitus with hyperglycemia: Secondary | ICD-10-CM | POA: Diagnosis not present

## 2023-07-25 DIAGNOSIS — R29898 Other symptoms and signs involving the musculoskeletal system: Secondary | ICD-10-CM | POA: Diagnosis not present

## 2023-07-25 DIAGNOSIS — E1151 Type 2 diabetes mellitus with diabetic peripheral angiopathy without gangrene: Secondary | ICD-10-CM | POA: Diagnosis not present

## 2023-07-25 DIAGNOSIS — I10 Essential (primary) hypertension: Secondary | ICD-10-CM | POA: Diagnosis not present

## 2023-07-25 DIAGNOSIS — Z9181 History of falling: Secondary | ICD-10-CM | POA: Diagnosis not present

## 2023-07-25 NOTE — Progress Notes (Signed)
Pt here for Blood pressure check per BP check per Dr Laury Axon: 07/06/23: "Elevated blood pressure despite reported adherence to current antihypertensive medication. Discussed the need to increase the dose of Lisinopril due to persistent hypertension. -Increase Lisinopril to 20/12.5mg  daily. -Check blood pressure in 2-3 weeks.:   Pt currently takes: Lisinopril- hydrochlorothiazide 20-12.5 mg daily   Pt reports compliance with medication.   BP today @ = 140/82 HR = 89  Automatic: -unable to obtain, machine ran out of batteries-    Pt advised per Dr Laury Axon:  Continue current BP meds, f/u in 3 months.

## 2023-07-27 DIAGNOSIS — F0150 Vascular dementia without behavioral disturbance: Secondary | ICD-10-CM | POA: Diagnosis not present

## 2023-07-27 DIAGNOSIS — E7800 Pure hypercholesterolemia, unspecified: Secondary | ICD-10-CM | POA: Diagnosis not present

## 2023-07-27 DIAGNOSIS — E1165 Type 2 diabetes mellitus with hyperglycemia: Secondary | ICD-10-CM | POA: Diagnosis not present

## 2023-07-27 DIAGNOSIS — I10 Essential (primary) hypertension: Secondary | ICD-10-CM | POA: Diagnosis not present

## 2023-07-27 DIAGNOSIS — E1169 Type 2 diabetes mellitus with other specified complication: Secondary | ICD-10-CM | POA: Diagnosis not present

## 2023-07-27 DIAGNOSIS — E1151 Type 2 diabetes mellitus with diabetic peripheral angiopathy without gangrene: Secondary | ICD-10-CM | POA: Diagnosis not present

## 2023-07-31 ENCOUNTER — Telehealth: Payer: Self-pay | Admitting: Family Medicine

## 2023-07-31 NOTE — Telephone Encounter (Signed)
Pt's spouse called to ask nurse if he is due for a shingles ad pneumonia vaccine? Please call and advise.

## 2023-08-01 DIAGNOSIS — E1169 Type 2 diabetes mellitus with other specified complication: Secondary | ICD-10-CM | POA: Diagnosis not present

## 2023-08-01 DIAGNOSIS — I10 Essential (primary) hypertension: Secondary | ICD-10-CM | POA: Diagnosis not present

## 2023-08-01 DIAGNOSIS — E1151 Type 2 diabetes mellitus with diabetic peripheral angiopathy without gangrene: Secondary | ICD-10-CM | POA: Diagnosis not present

## 2023-08-01 DIAGNOSIS — E7800 Pure hypercholesterolemia, unspecified: Secondary | ICD-10-CM | POA: Diagnosis not present

## 2023-08-01 DIAGNOSIS — F0150 Vascular dementia without behavioral disturbance: Secondary | ICD-10-CM | POA: Diagnosis not present

## 2023-08-01 DIAGNOSIS — E1165 Type 2 diabetes mellitus with hyperglycemia: Secondary | ICD-10-CM | POA: Diagnosis not present

## 2023-08-01 NOTE — Telephone Encounter (Signed)
Spoke with patients wife and she was made aware

## 2023-08-08 DIAGNOSIS — E1151 Type 2 diabetes mellitus with diabetic peripheral angiopathy without gangrene: Secondary | ICD-10-CM | POA: Diagnosis not present

## 2023-08-08 DIAGNOSIS — I10 Essential (primary) hypertension: Secondary | ICD-10-CM | POA: Diagnosis not present

## 2023-08-08 DIAGNOSIS — E1165 Type 2 diabetes mellitus with hyperglycemia: Secondary | ICD-10-CM | POA: Diagnosis not present

## 2023-08-08 DIAGNOSIS — F0150 Vascular dementia without behavioral disturbance: Secondary | ICD-10-CM | POA: Diagnosis not present

## 2023-08-08 DIAGNOSIS — E1169 Type 2 diabetes mellitus with other specified complication: Secondary | ICD-10-CM | POA: Diagnosis not present

## 2023-08-08 DIAGNOSIS — E7800 Pure hypercholesterolemia, unspecified: Secondary | ICD-10-CM | POA: Diagnosis not present

## 2023-08-09 DIAGNOSIS — I10 Essential (primary) hypertension: Secondary | ICD-10-CM | POA: Diagnosis not present

## 2023-08-09 DIAGNOSIS — E7800 Pure hypercholesterolemia, unspecified: Secondary | ICD-10-CM | POA: Diagnosis not present

## 2023-08-09 DIAGNOSIS — E1151 Type 2 diabetes mellitus with diabetic peripheral angiopathy without gangrene: Secondary | ICD-10-CM | POA: Diagnosis not present

## 2023-08-09 DIAGNOSIS — E1165 Type 2 diabetes mellitus with hyperglycemia: Secondary | ICD-10-CM | POA: Diagnosis not present

## 2023-08-09 DIAGNOSIS — F0150 Vascular dementia without behavioral disturbance: Secondary | ICD-10-CM | POA: Diagnosis not present

## 2023-08-09 DIAGNOSIS — E1169 Type 2 diabetes mellitus with other specified complication: Secondary | ICD-10-CM | POA: Diagnosis not present

## 2023-08-14 DIAGNOSIS — E7800 Pure hypercholesterolemia, unspecified: Secondary | ICD-10-CM | POA: Diagnosis not present

## 2023-08-14 DIAGNOSIS — E1169 Type 2 diabetes mellitus with other specified complication: Secondary | ICD-10-CM | POA: Diagnosis not present

## 2023-08-14 DIAGNOSIS — E1165 Type 2 diabetes mellitus with hyperglycemia: Secondary | ICD-10-CM | POA: Diagnosis not present

## 2023-08-14 DIAGNOSIS — E1151 Type 2 diabetes mellitus with diabetic peripheral angiopathy without gangrene: Secondary | ICD-10-CM | POA: Diagnosis not present

## 2023-08-14 DIAGNOSIS — I10 Essential (primary) hypertension: Secondary | ICD-10-CM | POA: Diagnosis not present

## 2023-08-14 DIAGNOSIS — F0150 Vascular dementia without behavioral disturbance: Secondary | ICD-10-CM | POA: Diagnosis not present

## 2023-08-15 DIAGNOSIS — E1151 Type 2 diabetes mellitus with diabetic peripheral angiopathy without gangrene: Secondary | ICD-10-CM | POA: Diagnosis not present

## 2023-08-15 DIAGNOSIS — E1169 Type 2 diabetes mellitus with other specified complication: Secondary | ICD-10-CM | POA: Diagnosis not present

## 2023-08-15 DIAGNOSIS — E1165 Type 2 diabetes mellitus with hyperglycemia: Secondary | ICD-10-CM | POA: Diagnosis not present

## 2023-08-15 DIAGNOSIS — I10 Essential (primary) hypertension: Secondary | ICD-10-CM | POA: Diagnosis not present

## 2023-08-15 DIAGNOSIS — F0150 Vascular dementia without behavioral disturbance: Secondary | ICD-10-CM | POA: Diagnosis not present

## 2023-08-15 DIAGNOSIS — E7800 Pure hypercholesterolemia, unspecified: Secondary | ICD-10-CM | POA: Diagnosis not present

## 2023-08-23 ENCOUNTER — Other Ambulatory Visit: Payer: Self-pay

## 2023-08-23 ENCOUNTER — Inpatient Hospital Stay (HOSPITAL_COMMUNITY)
Admission: EM | Admit: 2023-08-23 | Discharge: 2023-08-26 | DRG: 536 | Disposition: A | Discharge status: Skilled Nursing Facility | Payer: Medicare Other | Source: Skilled Nursing Facility | Attending: Family Medicine | Admitting: Family Medicine

## 2023-08-23 ENCOUNTER — Inpatient Hospital Stay: Payer: Medicare Other

## 2023-08-23 ENCOUNTER — Emergency Department (HOSPITAL_COMMUNITY): Payer: Medicare Other

## 2023-08-23 DIAGNOSIS — Z923 Personal history of irradiation: Secondary | ICD-10-CM

## 2023-08-23 DIAGNOSIS — E785 Hyperlipidemia, unspecified: Secondary | ICD-10-CM | POA: Diagnosis present

## 2023-08-23 DIAGNOSIS — D638 Anemia in other chronic diseases classified elsewhere: Secondary | ICD-10-CM | POA: Diagnosis not present

## 2023-08-23 DIAGNOSIS — S72111A Displaced fracture of greater trochanter of right femur, initial encounter for closed fracture: Secondary | ICD-10-CM | POA: Diagnosis present

## 2023-08-23 DIAGNOSIS — Z96642 Presence of left artificial hip joint: Secondary | ICD-10-CM | POA: Diagnosis present

## 2023-08-23 DIAGNOSIS — W010XXA Fall on same level from slipping, tripping and stumbling without subsequent striking against object, initial encounter: Secondary | ICD-10-CM | POA: Diagnosis present

## 2023-08-23 DIAGNOSIS — Z8249 Family history of ischemic heart disease and other diseases of the circulatory system: Secondary | ICD-10-CM | POA: Diagnosis not present

## 2023-08-23 DIAGNOSIS — D696 Thrombocytopenia, unspecified: Secondary | ICD-10-CM | POA: Diagnosis present

## 2023-08-23 DIAGNOSIS — R300 Dysuria: Secondary | ICD-10-CM | POA: Diagnosis not present

## 2023-08-23 DIAGNOSIS — I480 Paroxysmal atrial fibrillation: Secondary | ICD-10-CM | POA: Diagnosis present

## 2023-08-23 DIAGNOSIS — Z800 Family history of malignant neoplasm of digestive organs: Secondary | ICD-10-CM

## 2023-08-23 DIAGNOSIS — N400 Enlarged prostate without lower urinary tract symptoms: Secondary | ICD-10-CM | POA: Diagnosis present

## 2023-08-23 DIAGNOSIS — C9310 Chronic myelomonocytic leukemia not having achieved remission: Secondary | ICD-10-CM | POA: Diagnosis not present

## 2023-08-23 DIAGNOSIS — Z85820 Personal history of malignant melanoma of skin: Secondary | ICD-10-CM | POA: Diagnosis not present

## 2023-08-23 DIAGNOSIS — F0390 Unspecified dementia without behavioral disturbance: Secondary | ICD-10-CM | POA: Diagnosis not present

## 2023-08-23 DIAGNOSIS — Z8546 Personal history of malignant neoplasm of prostate: Secondary | ICD-10-CM | POA: Diagnosis not present

## 2023-08-23 DIAGNOSIS — W19XXXA Unspecified fall, initial encounter: Principal | ICD-10-CM

## 2023-08-23 DIAGNOSIS — R2689 Other abnormalities of gait and mobility: Secondary | ICD-10-CM | POA: Diagnosis not present

## 2023-08-23 DIAGNOSIS — Z043 Encounter for examination and observation following other accident: Secondary | ICD-10-CM | POA: Diagnosis not present

## 2023-08-23 DIAGNOSIS — Z96649 Presence of unspecified artificial hip joint: Secondary | ICD-10-CM

## 2023-08-23 DIAGNOSIS — Z85828 Personal history of other malignant neoplasm of skin: Secondary | ICD-10-CM

## 2023-08-23 DIAGNOSIS — W19XXXD Unspecified fall, subsequent encounter: Secondary | ICD-10-CM | POA: Diagnosis not present

## 2023-08-23 DIAGNOSIS — Y92090 Kitchen in other non-institutional residence as the place of occurrence of the external cause: Secondary | ICD-10-CM | POA: Diagnosis not present

## 2023-08-23 DIAGNOSIS — I10 Essential (primary) hypertension: Secondary | ICD-10-CM | POA: Diagnosis present

## 2023-08-23 DIAGNOSIS — F0153 Vascular dementia, unspecified severity, with mood disturbance: Secondary | ICD-10-CM | POA: Diagnosis present

## 2023-08-23 DIAGNOSIS — R2681 Unsteadiness on feet: Secondary | ICD-10-CM | POA: Diagnosis not present

## 2023-08-23 DIAGNOSIS — M25572 Pain in left ankle and joints of left foot: Secondary | ICD-10-CM | POA: Diagnosis not present

## 2023-08-23 DIAGNOSIS — M978XXA Periprosthetic fracture around other internal prosthetic joint, initial encounter: Secondary | ICD-10-CM

## 2023-08-23 DIAGNOSIS — Z860100 Personal history of colon polyps, unspecified: Secondary | ICD-10-CM | POA: Diagnosis not present

## 2023-08-23 DIAGNOSIS — H9190 Unspecified hearing loss, unspecified ear: Secondary | ICD-10-CM | POA: Diagnosis present

## 2023-08-23 DIAGNOSIS — M9701XD Periprosthetic fracture around internal prosthetic right hip joint, subsequent encounter: Secondary | ICD-10-CM | POA: Diagnosis not present

## 2023-08-23 DIAGNOSIS — F0150 Vascular dementia without behavioral disturbance: Secondary | ICD-10-CM | POA: Diagnosis present

## 2023-08-23 DIAGNOSIS — M353 Polymyalgia rheumatica: Secondary | ICD-10-CM | POA: Diagnosis present

## 2023-08-23 DIAGNOSIS — M25551 Pain in right hip: Secondary | ICD-10-CM | POA: Diagnosis not present

## 2023-08-23 DIAGNOSIS — M6281 Muscle weakness (generalized): Secondary | ICD-10-CM | POA: Diagnosis not present

## 2023-08-23 DIAGNOSIS — Z7401 Bed confinement status: Secondary | ICD-10-CM | POA: Diagnosis not present

## 2023-08-23 DIAGNOSIS — M9701XA Periprosthetic fracture around internal prosthetic right hip joint, initial encounter: Secondary | ICD-10-CM

## 2023-08-23 DIAGNOSIS — R509 Fever, unspecified: Secondary | ICD-10-CM | POA: Diagnosis not present

## 2023-08-23 DIAGNOSIS — D6959 Other secondary thrombocytopenia: Secondary | ICD-10-CM | POA: Diagnosis not present

## 2023-08-23 DIAGNOSIS — Z96643 Presence of artificial hip joint, bilateral: Secondary | ICD-10-CM | POA: Diagnosis not present

## 2023-08-23 DIAGNOSIS — Z96652 Presence of left artificial knee joint: Secondary | ICD-10-CM | POA: Diagnosis present

## 2023-08-23 DIAGNOSIS — D4620 Refractory anemia with excess of blasts, unspecified: Secondary | ICD-10-CM | POA: Diagnosis not present

## 2023-08-23 DIAGNOSIS — Z79899 Other long term (current) drug therapy: Secondary | ICD-10-CM

## 2023-08-23 DIAGNOSIS — S79911A Unspecified injury of right hip, initial encounter: Secondary | ICD-10-CM | POA: Diagnosis not present

## 2023-08-23 DIAGNOSIS — F39 Unspecified mood [affective] disorder: Secondary | ICD-10-CM | POA: Diagnosis not present

## 2023-08-23 LAB — CBC WITH DIFFERENTIAL/PLATELET
Abs Immature Granulocytes: 0.05 10*3/uL (ref 0.00–0.07)
Basophils Absolute: 0 10*3/uL (ref 0.0–0.1)
Basophils Relative: 0 %
Eosinophils Absolute: 0 10*3/uL (ref 0.0–0.5)
Eosinophils Relative: 0 %
HCT: 25.7 % — ABNORMAL LOW (ref 39.0–52.0)
Hemoglobin: 8.1 g/dL — ABNORMAL LOW (ref 13.0–17.0)
Immature Granulocytes: 1 %
Lymphocytes Relative: 29 %
Lymphs Abs: 1.9 10*3/uL (ref 0.7–4.0)
MCH: 34.8 pg — ABNORMAL HIGH (ref 26.0–34.0)
MCHC: 31.5 g/dL (ref 30.0–36.0)
MCV: 110.3 fL — ABNORMAL HIGH (ref 80.0–100.0)
Monocytes Absolute: 2.9 10*3/uL — ABNORMAL HIGH (ref 0.1–1.0)
Monocytes Relative: 42 %
Neutro Abs: 1.9 10*3/uL (ref 1.7–7.7)
Neutrophils Relative %: 28 %
Platelets: 98 10*3/uL — ABNORMAL LOW (ref 150–400)
RBC: 2.33 MIL/uL — ABNORMAL LOW (ref 4.22–5.81)
RDW: 16.9 % — ABNORMAL HIGH (ref 11.5–15.5)
WBC: 6.7 10*3/uL (ref 4.0–10.5)
nRBC: 0 % (ref 0.0–0.2)

## 2023-08-23 LAB — BASIC METABOLIC PANEL
Anion gap: 8 (ref 5–15)
BUN: 18 mg/dL (ref 8–23)
CO2: 24 mmol/L (ref 22–32)
Calcium: 9.1 mg/dL (ref 8.9–10.3)
Chloride: 109 mmol/L (ref 98–111)
Creatinine, Ser: 1.1 mg/dL (ref 0.61–1.24)
GFR, Estimated: >60 mL/min (ref >60)
Glucose, Bld: 100 mg/dL — ABNORMAL HIGH (ref 70–99)
Potassium: 3.5 mmol/L (ref 3.5–5.1)
Sodium: 141 mmol/L (ref 135–145)

## 2023-08-23 MED ORDER — ATORVASTATIN CALCIUM 40 MG PO TABS
40.0000 mg | ORAL_TABLET | Freq: Every evening | ORAL | Status: DC
  Administered 2023-08-23 – 2023-08-25 (×3): 40 mg via ORAL
  Filled 2023-08-23 (×4): qty 1

## 2023-08-23 MED ORDER — FENTANYL CITRATE PF 50 MCG/ML IJ SOSY
25.0000 ug | PREFILLED_SYRINGE | Freq: Once | INTRAMUSCULAR | Status: AC
  Administered 2023-08-23: 25 ug via INTRAVENOUS
  Filled 2023-08-23: qty 1

## 2023-08-23 MED ORDER — HYDROCHLOROTHIAZIDE 12.5 MG PO TABS
12.5000 mg | ORAL_TABLET | Freq: Every day | ORAL | Status: DC
  Administered 2023-08-23 – 2023-08-26 (×4): 12.5 mg via ORAL
  Filled 2023-08-23 (×4): qty 1

## 2023-08-23 MED ORDER — ADULT MULTIVITAMIN W/MINERALS CH
1.0000 | ORAL_TABLET | Freq: Every evening | ORAL | Status: DC
  Administered 2023-08-23 – 2023-08-25 (×3): 1 via ORAL
  Filled 2023-08-23 (×3): qty 1

## 2023-08-23 MED ORDER — BISACODYL 10 MG RE SUPP: 10.0000 mg | Freq: Every day | RECTAL | Status: DC | PRN

## 2023-08-23 MED ORDER — LISINOPRIL 20 MG PO TABS
20.0000 mg | ORAL_TABLET | Freq: Every day | ORAL | Status: DC
  Administered 2023-08-23 – 2023-08-26 (×4): 20 mg via ORAL
  Filled 2023-08-23 (×4): qty 1

## 2023-08-23 MED ORDER — OYSTER SHELL CALCIUM/D3 500-5 MG-MCG PO TABS
1.0000 | ORAL_TABLET | Freq: Every day | ORAL | Status: DC
  Administered 2023-08-24 – 2023-08-26 (×3): 1 via ORAL
  Filled 2023-08-23 (×3): qty 1

## 2023-08-23 MED ORDER — ENSURE ENLIVE PO LIQD
237.0000 mL | Freq: Two times a day (BID) | ORAL | Status: DC
  Administered 2023-08-24 – 2023-08-25 (×4): 237 mL via ORAL

## 2023-08-23 MED ORDER — VENLAFAXINE HCL ER 150 MG PO CP24
150.0000 mg | ORAL_CAPSULE | Freq: Every day | ORAL | Status: DC
  Administered 2023-08-24 – 2023-08-26 (×3): 150 mg via ORAL
  Filled 2023-08-23 (×3): qty 1

## 2023-08-23 MED ORDER — LORATADINE 10 MG PO TABS
10.0000 mg | ORAL_TABLET | Freq: Every day | ORAL | Status: DC
  Administered 2023-08-24 – 2023-08-26 (×3): 10 mg via ORAL
  Filled 2023-08-23 (×3): qty 1

## 2023-08-23 MED ORDER — LISINOPRIL-HYDROCHLOROTHIAZIDE 20-12.5 MG PO TABS: 1.0000 | ORAL_TABLET | Freq: Every day | ORAL | Status: DC

## 2023-08-23 MED ORDER — SENNA 8.6 MG PO TABS
1.0000 | ORAL_TABLET | Freq: Two times a day (BID) | ORAL | Status: DC
  Administered 2023-08-23 – 2023-08-24 (×2): 8.6 mg via ORAL
  Filled 2023-08-23 (×3): qty 1

## 2023-08-23 MED ORDER — HYDROMORPHONE HCL 1 MG/ML IJ SOLN: 0.5000 mg | INTRAMUSCULAR | Status: DC | PRN

## 2023-08-23 MED ORDER — TERAZOSIN HCL 1 MG PO CAPS
1.0000 mg | ORAL_CAPSULE | Freq: Every day | ORAL | Status: DC
  Administered 2023-08-23 – 2023-08-25 (×3): 1 mg via ORAL
  Filled 2023-08-23 (×3): qty 1

## 2023-08-23 MED ORDER — ACETAMINOPHEN 500 MG PO TABS
1000.0000 mg | ORAL_TABLET | Freq: Three times a day (TID) | ORAL | Status: DC
  Administered 2023-08-23 – 2023-08-25 (×7): 1000 mg via ORAL
  Filled 2023-08-23 (×9): qty 2

## 2023-08-23 MED ORDER — CALCIUM CARB-CHOLECALCIFEROL 600-5 MG-MCG PO TABS: ORAL_TABLET | Freq: Every morning | ORAL | Status: DC

## 2023-08-23 MED ORDER — DONEPEZIL HCL 23 MG PO TABS
23.0000 mg | ORAL_TABLET | Freq: Every day | ORAL | Status: DC
  Administered 2023-08-23 – 2023-08-26 (×4): 23 mg via ORAL
  Filled 2023-08-23 (×4): qty 1

## 2023-08-23 MED ORDER — HEPARIN SODIUM (PORCINE) 5000 UNIT/ML IJ SOLN
5000.0000 [IU] | Freq: Three times a day (TID) | INTRAMUSCULAR | Status: DC
  Administered 2023-08-23 – 2023-08-26 (×9): 5000 [IU] via SUBCUTANEOUS
  Filled 2023-08-23 (×9): qty 1

## 2023-08-23 MED ORDER — HYDROXYCHLOROQUINE SULFATE 200 MG PO TABS
200.0000 mg | ORAL_TABLET | Freq: Two times a day (BID) | ORAL | Status: DC
  Administered 2023-08-23 – 2023-08-26 (×6): 200 mg via ORAL
  Filled 2023-08-23 (×6): qty 1

## 2023-08-23 MED ORDER — OXYCODONE HCL 5 MG PO TABS
5.0000 mg | ORAL_TABLET | ORAL | Status: DC | PRN
Start: 2023-08-23 — End: 2023-08-24

## 2023-08-23 NOTE — ED Provider Notes (Signed)
[place] EMERGENCY DEPARTMENT AT [facility] Provider Note   CSN: [id number] Arrival date & time: 08/23/23  0957     History  Chief Complaint  Patient presents with   Fall    C/o right hip pain d/t recent fall    Andrew Ramos is a 89 y.o. male.  HPI Elderly male presents from his assisted living facility with concern for ongoing right hip pain.  Patient had a fall 2 days ago.  Exact episode unclear, but since that time patient has had difficulty with weightbearing, ambulation due to pain in his right hip.  He has a history of bilateral hip replacements.  He arrives via EMS.  No report of hemodynamic instability.    Home Medications Prior to Admission medications   Medication Sig Start Date End Date Taking? Authorizing Provider  atorvastatin (LIPITOR) 40 MG tablet Take 1 tablet (40 mg total) by mouth daily. 07/06/23   Donato Schultz, DO  Calcium Carbonate-Vitamin D (CALCIUM 600 + D PO) Take 1 tablet by mouth every morning.     [provider]  Cholecalciferol (VITAMIN D3) 1000 UNITS tablet Take 1,000 Units by mouth every morning.    [provider]  docusate sodium (COLACE) 100 MG capsule Take 200 mg by mouth daily as needed for mild constipation.    [provider]  donepezil (ARICEPT) 23 MG TABS tablet Take 1 tablet (23 mg total) by mouth daily. 07/05/23   Marcos Eke, PA-C  fluticasone (FLONASE) 50 MCG/ACT nasal spray Place 2 sprays into both nostrils daily. 05/10/22   Donato Schultz, DO  hydroxychloroquine (PLAQUENIL) 200 MG tablet Take 1 tablet by mouth 2 (two) times daily.    [provider]  Iron, Ferrous Sulfate, 325 (65 Fe) MG TABS Take 325 mg by mouth daily. 02/23/21   Donato Schultz, DO  lisinopril-hydrochlorothiazide (ZESTORETIC) 20-12.5 MG tablet Take 1 tablet by mouth daily. 07/06/23   Donato Schultz, DO  loratadine (CLARITIN) 10 MG tablet TAKE 1 TABLET DAILY Patient not taking: Reported  on 07/10/2023 01/07/21   Donato Schultz, DO  Multiple Vitamins-Minerals (CENTRUM SILVER ULTRA MENS) TABS Take 1 tablet by mouth every evening.    [provider]  terazosin (HYTRIN) 1 MG capsule TAKE 1 CAPSULE AT BEDTIME 07/07/21   Zola Button, Grayling Congress, DO  venlafaxine XR (EFFEXOR XR) 150 MG 24 hr capsule Take 1 capsule (150 mg total) by mouth daily with breakfast. 07/06/23   Donato Schultz, DO      Allergies    Patient has no known allergies.    Review of Systems   Review of Systems  Physical Exam Updated Vital Signs BP 136/72 (BP Location: Left Arm)   Pulse 62   Temp 98.5 F (36.9 C) (Oral)   Resp 16   Ht 5\' 11"  (1.803 m)   Wt 75.8 kg   SpO2 100%   BMI 23.29 kg/m  Physical Exam Vitals and nursing note reviewed.  Constitutional:      General: He is not in acute distress.    Appearance: He is well-developed.  HENT:     Head: Normocephalic and atraumatic.   Eyes:     Conjunctiva/sclera: Conjunctivae normal.  Cardiovascular:     Rate and Rhythm: Normal rate and regular rhythm.  Pulmonary:     Effort: Pulmonary effort is normal. No respiratory distress.     Breath sounds: No stridor.  Abdominal:  General: There is no distension.  Musculoskeletal:       Arms:  Skin:    General: Skin is warm and dry.  Neurological:     Mental Status: He is alert and oriented to person, place, and time.     ED Results / Procedures / Treatments   Labs (all labs ordered are listed, but only abnormal results are displayed) Labs Reviewed - No data to display  EKG None  Radiology No results found.  Procedures Procedures    Medications Ordered in ED Medications - No data to display  ED Course/ Medical Decision Making/ A&P                                 Medical Decision Making Fall with pain in his right hip.  With prior replacements concern for periprosthetic fracture versus pelvic fracture versus soft tissue injury.  Patient is awake, alert,  otherwise neurologically and hemodynamically unremarkable, reassuring. Pulse ox 100% room air normal Cardiac 60 sinus normal  Amount and/or Complexity of Data Reviewed Independent Historian: EMS External Data Reviewed: notes. Radiology: ordered and independent interpretation performed. Decision-making details documented in ED Course.  Risk Prescription drug management. Decision regarding hospitalization.   Update: Patient accompanied by his son and daughter-in-law.  We reviewed the patient's history, prior left periprosthetic hip fracture, and today's evidence for periprosthetic hip fracture on the right.  Patient's vital signs remain essentially unremarkable aside from mild hypertension.  I discussed the patient's case with her orthopedic colleagues, and recommendation is for partial weightbearing, with pain control, nonoperative care.  I discussed this with the patient's family, and given concerns for inability to ambulate as he was previously, and for pain control, I discussed the case with our hospitalist colleagues for admission with anticipated PT eval, ongoing pain management.        Final Clinical Impression(s) / ED Diagnoses Final diagnoses:  Fall, initial encounter  Periprosthetic fracture around internal prosthetic hip joint, initial encounter    Rx / DC Orders ED Discharge Orders     None         Gerhard Munch, MD 08/23/23 1628

## 2023-08-23 NOTE — ED Triage Notes (Signed)
Pt. BIBA from [facility]. Pt. Is c/o right hip pain w/ weight bearing d/t a recent fall on Monday. EMS stated pt. Denies hitting his head. No blood thinners. Hx of double hip replacement. Laceration noted to left arm from recent fall. EMS stated pt. Awaken w/ blood around his mouth of unknown origin.  158/80 65 18 99% RA CBG: 121

## 2023-08-23 NOTE — ED Notes (Deleted)
Error

## 2023-08-23 NOTE — ED Notes (Signed)
ED TO INPATIENT HANDOFF REPORT  Name/Age/Gender Andrew Ramos 89 y.o. male  Code Status    Code Status Orders  (From admission, onward)           Start     Ordered   08/23/23 1625  Full code  Continuous       Question:  By:  Answer:  Consent: discussion documented in EHR   08/23/23 1626           Code Status History     Date Active Date Inactive Code Status Order ID Comments User Context   09/29/2016 2022 10/01/2016 1809 Full Code [id number]  Vassie Loll, MD ED   06/12/2012 2005 06/14/2012 1436 Full Code [id number]  Shari Prows, RN Inpatient      Advance Directive Documentation    Flowsheet Row Most Recent Value  Type of Advance Directive Healthcare Power of Attorney, Living will  Pre-existing out of facility DNR order (yellow form or pink MOST form) --  "MOST" Form in Place? --       Home/SNF/Other Skilled nursing facility  Chief Complaint Periprosthetic fracture around internal prosthetic right hip joint (HCC) [U72.01XA]  Level of Care/Admitting Diagnosis ED Disposition     ED Disposition  Admit   Condition  --   Comment  Hospital Area: [facility], [place] [[id number]]  Level of Care: Med-Surg [16]  May place patient in observation at [facility] or [facility] if equivalent level of care is available:: No  Covid Evaluation: Asymptomatic - no recent exposure (last 10 days) testing not required  Diagnosis: Periprosthetic fracture around internal prosthetic right hip joint [facility]) [[id number]]  Admitting Physician: Almon Hercules [[id number]]  Attending Physician: Almon Hercules [[id number]]          Medical History Past Medical History:  Diagnosis Date   Chronic myelomonocytic leukemia (HCC) 11/25/2020   Colon polyps    Tubular Adenoma 2005   Diverticulosis    Goals of care, counseling/discussion 11/25/2020   Hepatitis    pt told by [facility] has hepatitis antibodies in blood    Hyperlipidemia    Hypertension    Low grade  myelodysplastic syndrome lesions (HCC) 10/26/2020   Melanoma (HCC)    Nonmelanoma skin cancer    Osteoarthritis    right hip- none since hip replacement   Polymyalgia rheumatica (HCC)    Polyposis of colon    Prostate cancer (HCC) 2007   s/p radiation seed implants   Prostate cancer [facility])    Thyroid disease sees dr altzhimer for yearly   nodules    Allergies No Known Allergies  IV Location/Drains/Wounds Patient Lines/Drains/Airways Status     Active Line/Drains/Airways     Name Placement date Placement time Site Days   Peripheral IV 08/23/23 20 G Right Antecubital 08/23/23  1607  Antecubital  less than 1   Incision 06/12/12 Hip 06/12/12  1721  -- 4089            Labs/Imaging Results for orders placed or performed during the hospital encounter of 08/23/23 (from the past 48 hour(s))  Basic metabolic panel     Status: Abnormal   Collection Time: 08/23/23  4:29 PM  Result Value Ref Range   Sodium 141 135 - 145 mmol/L   Potassium 3.5 3.5 - 5.1 mmol/L   Chloride 109 98 - 111 mmol/L   CO2 24 22 - 32 mmol/L   Glucose, Bld 100 (H) 70 - 99 mg/dL    Comment: Glucose reference  range applies only to samples taken after fasting for at least 8 hours.   BUN 18 8 - 23 mg/dL   Creatinine, Ser 1.61 0.61 - 1.24 mg/dL   Calcium 9.1 8.9 - 09.6 mg/dL   GFR, Estimated >04 >54 mL/min    Comment: (NOTE) Calculated using the CKD-EPI Creatinine Equation (2021)    Anion gap 8 5 - 15    Comment: Performed at [facility], [address] [place], [place] [address]  CBC with Differential     Status: Abnormal   Collection Time: 08/23/23  4:29 PM  Result Value Ref Range   WBC 6.7 4.0 - 10.5 K/uL   RBC 2.33 (L) 4.22 - 5.81 MIL/uL   Hemoglobin 8.1 (L) 13.0 - 17.0 g/dL   HCT 91.4 (L) 78.2 - 95.6 %   MCV 110.3 (H) 80.0 - 100.0 fL   MCH 34.8 (H) 26.0 - 34.0 pg   MCHC 31.5 30.0 - 36.0 g/dL   RDW 21.3 (H) 08.6 - 57.8 %   Platelets 98 (L) 150 - 400 K/uL    Comment: SPECIMEN  CHECKED FOR CLOTS Immature Platelet Fraction may be clinically indicated, consider ordering this additional test [id number] REPEATED TO VERIFY    nRBC 0.0 0.0 - 0.2 %   Neutrophils Relative % 28 %   Neutro Abs 1.9 1.7 - 7.7 K/uL   Lymphocytes Relative 29 %   Lymphs Abs 1.9 0.7 - 4.0 K/uL   Monocytes Relative 42 %   Monocytes Absolute 2.9 (H) 0.1 - 1.0 K/uL   Eosinophils Relative 0 %   Eosinophils Absolute 0.0 0.0 - 0.5 K/uL   Basophils Relative 0 %   Basophils Absolute 0.0 0.0 - 0.1 K/uL   Immature Granulocytes 1 %   Abs Immature Granulocytes 0.05 0.00 - 0.07 K/uL    Comment: Performed at [facility], [address], [place], [place] [address]   DG Hip Lucienne Capers or Wo Pelvis 2-3 Views Right  Result Date: 08/23/2023 CLINICAL DATA:  89 year old male status post fall 2 days ago with hip pain. EXAM: DG HIP (WITH OR WITHOUT PELVIS) 2-3V RIGHT COMPARISON:  Pelvis radiographs 06/12/2012. FINDINGS: AP views of the pelvis and right hip at 1055 hours. Chronic bilateral total hip arthroplasty. Chronic prostate brachytherapy. Bulky heterotopic ossification about the proximal left femur which has progressed since 2013. Pelvis appears stable and intact. SI joints are symmetric. Nonobstructed bowel-gas pattern. Evidence of acute periprosthetic fracture of the proximal right femur most apparent at the greater trochanter. Minimally displaced fracture. Maintained AP hip arthroplasty alignment there. IMPRESSION: 1. Suspicious for acute periprosthetic fracture at the proximal right femur intertrochanteric segment. 2. No other acute osseous abnormality identified about the pelvis. Electronically Signed   By: Odessa Fleming M.D.   On: 08/23/2023 11:33    Pending Labs Unresulted Labs (From admission, onward)     Start     Ordered   08/24/23 0500  Basic metabolic panel  Tomorrow morning,   R        08/23/23 1626   08/24/23 0500  CBC  Tomorrow morning,   R        08/23/23 1626   08/23/23 1703   VITAMIN D 25 Hydroxy (Vit-D Deficiency, Fractures)  Add-on,   AD        08/23/23 1703            Vitals/Pain Today's Vitals   08/23/23 1500 08/23/23 1520 08/23/23 1700 08/23/23 1726  BP: (!) 143/63 Marland Kitchen)  169/77 (!) 158/81   Pulse: 78 89 82   Resp:   18   Temp:      TempSrc:      SpO2: 100% 94% 100%   Weight:      Height:      PainSc:    Asleep    Isolation Precautions No active isolations  Medications Medications  heparin injection 5,000 Units (5,000 Units Subcutaneous Given 08/23/23 1727)  acetaminophen (TYLENOL) tablet 1,000 mg (1,000 mg Oral Given 08/23/23 1727)  oxyCODONE (Oxy IR/ROXICODONE) immediate release tablet 5 mg (has no administration in time range)  HYDROmorphone (DILAUDID) injection 0.5 mg (has no administration in time range)  senna (SENOKOT) tablet 8.6 mg (has no administration in time range)  bisacodyl (DULCOLAX) suppository 10 mg (has no administration in time range)  hydroxychloroquine (PLAQUENIL) tablet 200 mg (has no administration in time range)  atorvastatin (LIPITOR) tablet 40 mg (40 mg Oral Given 08/23/23 1727)  terazosin (HYTRIN) capsule 1 mg (has no administration in time range)  donepezil (ARICEPT) tablet 23 mg (has no administration in time range)  venlafaxine XR (EFFEXOR-XR) 24 hr capsule 150 mg (has no administration in time range)  multivitamin with minerals tablet 1 tablet (has no administration in time range)  Ensure liquid 237 mL (has no administration in time range)  loratadine (CLARITIN) tablet 10 mg (has no administration in time range)  lisinopril (ZESTRIL) tablet 20 mg (has no administration in time range)    And  hydrochlorothiazide (HYDRODIURIL) tablet 12.5 mg (has no administration in time range)  calcium-vitamin D (OSCAL WITH D) 500-5 MG-MCG per tablet 1 tablet (has no administration in time range)  fentaNYL (SUBLIMAZE) injection 25 mcg (25 mcg Intravenous Given 08/23/23 1609)    Mobility non-ambulatory

## 2023-08-23 NOTE — H&P (Signed)
History and Physical    Patient: Andrew Ramos:[id number] DOB: [birth year] DOA: 08/23/2023 DOS: the patient was seen and examined on 08/23/2023 PCP: Donato Schultz, DO  Patient coming from: ALF/ILF  Chief Complaint:  Chief Complaint  Patient presents with   Fall    C/o right hip pain d/t recent fall   HPI: Andrew Ramos is a 89 y.o. male with PMH of dementia, MDS, PMR, bilateral THA, PAF not on AC, BPH, prostate cancer, melanoma and impaired hearing brought to ED from ALF due to persistent right hip pain with weightbearing after his fall 2 days prior.  Patient was starting in the kitchen when he suddenly lost his balance and fell with his right hip against hard floor.  Denies hitting his head, LOC or any prodrome such as dizziness, chest pain, palpitation or shortness of breath.  Has been feeling significant pain in right hip radiating all the way on the right side with weightbearing.  His therapist at ALF noted difficulty ambulating and ordered him rolling walker but facility called EMS and sent patient to ED.  Patient denies pain lying in bed.  Denies numbness or tingling.  Denies fever, runny nose, sore throat, pain, shortness of breath, GI or UTI symptoms.  Patient denies smoking cigarette, drinking alcohol recreational drug use.  Likes to remain full code stating "I will like to hang around to join the party of 90s at [place]".  In ED, vital stable.  Hip x-ray suspicious for acute periprosthetic fracture at the proximal right femur intertrochanteric segment.  No labs ordered.  Per EDP, orthopedic surgeon Dr. Charlann Boxer consulted and recommended nonoperative management with pain control and therapy.  I have requested EDP to clarify about weightbearing, and preferably drop consult note with clear plan  Review of Systems: As mentioned in the history of present illness. All other systems reviewed and are negative. Past Medical History:  Diagnosis Date   Chronic myelomonocytic  leukemia (HCC) 11/25/2020   Colon polyps    Tubular Adenoma 2005   Diverticulosis    Goals of care, counseling/discussion 11/25/2020   Hepatitis    pt told by [facility] has hepatitis antibodies in blood    Hyperlipidemia    Hypertension    Low grade myelodysplastic syndrome lesions (HCC) 10/26/2020   Melanoma (HCC)    Nonmelanoma skin cancer    Osteoarthritis    right hip- none since hip replacement   Polymyalgia rheumatica (HCC)    Polyposis of colon    Prostate cancer (HCC) 2007   s/p radiation seed implants   Prostate cancer [facility])    Thyroid disease sees dr altzhimer for yearly   nodules   Past Surgical History:  Procedure Laterality Date   CATARACT EXTRACTION     POLYPECTOMY     TONSILLECTOMY  as child   TOTAL HIP ARTHROPLASTY  08/27/2008   left   TOTAL HIP ARTHROPLASTY  06/12/2012   Procedure: TOTAL HIP ARTHROPLASTY ANTERIOR APPROACH;  Surgeon: Shelda Pal, MD;  Location: [facility];  Service: Orthopedics;  Laterality: Right;   TOTAL KNEE ARTHROPLASTY  12/31/2008   left   Social History:  reports that he has never smoked. He has never used smokeless tobacco. He reports current alcohol use. He reports that he does not use drugs.  No Known Allergies  Family History  Problem Relation Age of Onset   Coronary artery disease Mother 18   Heart disease Mother 34       MI  Coronary artery disease Father 31   Pancreatic cancer Father 55       deceased 46   Pancreatic cancer Brother 60       deceased 59; smoker   Colon cancer Neg Hx    Colon polyps Neg Hx    Esophageal cancer Neg Hx    Kidney disease Neg Hx    Diabetes Neg Hx    Gallbladder disease Neg Hx     Prior to Admission medications   Medication Sig Start Date End Date Taking? Authorizing Provider  atorvastatin (LIPITOR) 40 MG tablet Take 1 tablet (40 mg total) by mouth daily. 07/06/23  Yes Donato Schultz, DO  Calcium Carbonate-Vitamin D (CALCIUM 600 + D PO) Take 1 tablet by mouth every morning.    Yes  [provider]  Cholecalciferol (VITAMIN D3) 1000 UNITS tablet Take 1,000 Units by mouth every morning.   Yes [provider]  docusate sodium (COLACE) 100 MG capsule Take 200 mg by mouth daily as needed for mild constipation.   Yes [provider]  donepezil (ARICEPT) 23 MG TABS tablet Take 1 tablet (23 mg total) by mouth daily. 07/05/23  Yes Marcos Eke, PA-C  fluticasone (FLONASE) 50 MCG/ACT nasal spray Place 2 sprays into both nostrils daily. Patient taking differently: Place 2 sprays into both nostrils daily as needed for allergies. 05/10/22  Yes Donato Schultz, DO  hydroxychloroquine (PLAQUENIL) 200 MG tablet Take 1 tablet by mouth 2 (two) times daily.   Yes [provider]  Iron, Ferrous Sulfate, 325 (65 Fe) MG TABS Take 325 mg by mouth daily. 02/23/21  Yes Seabron Spates R, DO  lisinopril-hydrochlorothiazide (ZESTORETIC) 20-12.5 MG tablet Take 1 tablet by mouth daily. 07/06/23   Donato Schultz, DO  loratadine (CLARITIN) 10 MG tablet TAKE 1 TABLET DAILY Patient not taking: Reported on 07/10/2023 01/07/21   Donato Schultz, DO  Multiple Vitamins-Minerals (CENTRUM SILVER ULTRA MENS) TABS Take 1 tablet by mouth every evening.    [provider]  terazosin (HYTRIN) 1 MG capsule TAKE 1 CAPSULE AT BEDTIME 07/07/21   Zola Button, Grayling Congress, DO  venlafaxine XR (EFFEXOR XR) 150 MG 24 hr capsule Take 1 capsule (150 mg total) by mouth daily with breakfast. 07/06/23   Donato Schultz, DO    Physical Exam: Vitals:   08/23/23 1251 08/23/23 1400 08/23/23 1500 08/23/23 1520  BP:  136/64 (!) 143/63 (!) 169/77  Pulse:  63 78 89  Resp:  18    Temp: 98.4 F (36.9 C) 98.4 F (36.9 C)    TempSrc: Oral Oral    SpO2:  100% 100% 94%  Weight:      Height:       GENERAL: No apparent distress.  Nontoxic. HEENT: MMM.  Vision grossly intact.  Impaired hearing. NECK: Supple.  No apparent JVD.  RESP:  No IWOB.  Fair aeration  bilaterally. CVS:  RRR. Heart sounds normal.  ABD/GI/GU: BS+. Abd soft, NTND.  MSK/EXT:   No apparent deformity.  Significant tenderness over right  hip region.  Neurovascular intact. SKIN: no apparent skin lesion or wound NEURO: Awake and alert. Oriented appropriately.  No apparent focal neuro deficit. PSYCH: Calm. Normal affect.  Data Reviewed: See HPI  Assessment and Plan: Accidental fall at ALF: No prodrome.  Did not strike head.  No LOC. Possible acute periprosthetic fracture at proximal right femur due to fall:  Noted on x-ray. Patient reports significant sharp  pain with weightbearing.  Per EDP, orthopedic surgeon Dr. Charlann Boxer consulted and recommended nonoperative management with pain control and therapy.  I have requested EDP to clarify about weightbearing, and preferably drop consult note with clear plan. -Pain control with scheduled Tylenol, as needed Robaxin, oxycodone and IV Dilaudid -Scheduled and as needed bowel regimens -PT/OT eval -Fall precaution -Follow basic labs   Essential hypertension: Normotensive -Resume home meds after med rec  PMR: On Plaquenil for this? -Resume home Plaquenil after med rec  Mood disorder: Stable -Resume home meds after med rec  Hyperlipidemia -Resume home meds after med rec  BPH -Resume home terazosin after med rec   Advance Care Planning:   Code Status: Full Code   Consults: EDP consulted orthopedic surgery, Dr. Charlann Boxer  Family Communication: None at bedside  Severity of Illness: The appropriate patient status for this patient is OBSERVATION. Observation status is judged to be reasonable and necessary in order to provide the required intensity of service to ensure the patient's safety. The patient's presenting symptoms, physical exam findings, and initial radiographic and laboratory data in the context of their medical condition is felt to place them at decreased risk for further clinical deterioration. Furthermore, it is anticipated  that the patient will be medically stable for discharge from the hospital within 2 midnights of admission.   Author: Almon Hercules, MD 08/23/2023 4:48 PM  For on call review [email or web address].

## 2023-08-23 NOTE — Progress Notes (Signed)
Dr. Charlann Boxer and I reviewed his hip films Plan for non-op treatment Partial weight bearing RLE 50% with walker   Will see him tomorrow  Full note to follow  Rosalene Billings, PA-C Orthopedic Surgery [facility] [place] [phone]

## 2023-08-24 ENCOUNTER — Encounter (HOSPITAL_COMMUNITY): Payer: Self-pay | Admitting: Student

## 2023-08-24 DIAGNOSIS — W19XXXD Unspecified fall, subsequent encounter: Secondary | ICD-10-CM | POA: Diagnosis not present

## 2023-08-24 DIAGNOSIS — M353 Polymyalgia rheumatica: Secondary | ICD-10-CM | POA: Diagnosis present

## 2023-08-24 DIAGNOSIS — F39 Unspecified mood [affective] disorder: Secondary | ICD-10-CM | POA: Diagnosis not present

## 2023-08-24 DIAGNOSIS — Y92090 Kitchen in other non-institutional residence as the place of occurrence of the external cause: Secondary | ICD-10-CM | POA: Diagnosis not present

## 2023-08-24 DIAGNOSIS — D6959 Other secondary thrombocytopenia: Secondary | ICD-10-CM | POA: Diagnosis not present

## 2023-08-24 DIAGNOSIS — M9701XA Periprosthetic fracture around internal prosthetic right hip joint, initial encounter: Secondary | ICD-10-CM | POA: Diagnosis present

## 2023-08-24 DIAGNOSIS — F0150 Vascular dementia without behavioral disturbance: Secondary | ICD-10-CM

## 2023-08-24 DIAGNOSIS — H9190 Unspecified hearing loss, unspecified ear: Secondary | ICD-10-CM | POA: Diagnosis present

## 2023-08-24 DIAGNOSIS — Z8546 Personal history of malignant neoplasm of prostate: Secondary | ICD-10-CM | POA: Diagnosis not present

## 2023-08-24 DIAGNOSIS — R509 Fever, unspecified: Secondary | ICD-10-CM | POA: Diagnosis not present

## 2023-08-24 DIAGNOSIS — F0153 Vascular dementia, unspecified severity, with mood disturbance: Secondary | ICD-10-CM | POA: Diagnosis present

## 2023-08-24 DIAGNOSIS — R2681 Unsteadiness on feet: Secondary | ICD-10-CM | POA: Diagnosis not present

## 2023-08-24 DIAGNOSIS — M978XXA Periprosthetic fracture around other internal prosthetic joint, initial encounter: Secondary | ICD-10-CM

## 2023-08-24 DIAGNOSIS — N400 Enlarged prostate without lower urinary tract symptoms: Secondary | ICD-10-CM | POA: Diagnosis present

## 2023-08-24 DIAGNOSIS — M9701XD Periprosthetic fracture around internal prosthetic right hip joint, subsequent encounter: Secondary | ICD-10-CM | POA: Diagnosis not present

## 2023-08-24 DIAGNOSIS — Z79899 Other long term (current) drug therapy: Secondary | ICD-10-CM | POA: Diagnosis not present

## 2023-08-24 DIAGNOSIS — E785 Hyperlipidemia, unspecified: Secondary | ICD-10-CM | POA: Diagnosis present

## 2023-08-24 DIAGNOSIS — M25551 Pain in right hip: Secondary | ICD-10-CM | POA: Diagnosis not present

## 2023-08-24 DIAGNOSIS — Z8249 Family history of ischemic heart disease and other diseases of the circulatory system: Secondary | ICD-10-CM | POA: Diagnosis not present

## 2023-08-24 DIAGNOSIS — I10 Essential (primary) hypertension: Secondary | ICD-10-CM | POA: Diagnosis present

## 2023-08-24 DIAGNOSIS — Z85820 Personal history of malignant melanoma of skin: Secondary | ICD-10-CM | POA: Diagnosis not present

## 2023-08-24 DIAGNOSIS — Z96652 Presence of left artificial knee joint: Secondary | ICD-10-CM | POA: Diagnosis present

## 2023-08-24 DIAGNOSIS — Z923 Personal history of irradiation: Secondary | ICD-10-CM | POA: Diagnosis not present

## 2023-08-24 DIAGNOSIS — Z96642 Presence of left artificial hip joint: Secondary | ICD-10-CM | POA: Diagnosis present

## 2023-08-24 DIAGNOSIS — R2689 Other abnormalities of gait and mobility: Secondary | ICD-10-CM | POA: Diagnosis not present

## 2023-08-24 DIAGNOSIS — W19XXXA Unspecified fall, initial encounter: Secondary | ICD-10-CM | POA: Diagnosis not present

## 2023-08-24 DIAGNOSIS — D4620 Refractory anemia with excess of blasts, unspecified: Secondary | ICD-10-CM | POA: Diagnosis not present

## 2023-08-24 DIAGNOSIS — R300 Dysuria: Secondary | ICD-10-CM | POA: Diagnosis not present

## 2023-08-24 DIAGNOSIS — S72111A Displaced fracture of greater trochanter of right femur, initial encounter for closed fracture: Secondary | ICD-10-CM | POA: Diagnosis present

## 2023-08-24 DIAGNOSIS — Z85828 Personal history of other malignant neoplasm of skin: Secondary | ICD-10-CM | POA: Diagnosis not present

## 2023-08-24 DIAGNOSIS — I480 Paroxysmal atrial fibrillation: Secondary | ICD-10-CM | POA: Diagnosis present

## 2023-08-24 DIAGNOSIS — Z7401 Bed confinement status: Secondary | ICD-10-CM | POA: Diagnosis not present

## 2023-08-24 DIAGNOSIS — M6281 Muscle weakness (generalized): Secondary | ICD-10-CM | POA: Diagnosis not present

## 2023-08-24 DIAGNOSIS — Z860100 Personal history of colon polyps, unspecified: Secondary | ICD-10-CM | POA: Diagnosis not present

## 2023-08-24 DIAGNOSIS — D696 Thrombocytopenia, unspecified: Secondary | ICD-10-CM | POA: Diagnosis present

## 2023-08-24 DIAGNOSIS — Z800 Family history of malignant neoplasm of digestive organs: Secondary | ICD-10-CM | POA: Diagnosis not present

## 2023-08-24 DIAGNOSIS — W010XXA Fall on same level from slipping, tripping and stumbling without subsequent striking against object, initial encounter: Secondary | ICD-10-CM | POA: Diagnosis present

## 2023-08-24 DIAGNOSIS — C9310 Chronic myelomonocytic leukemia not having achieved remission: Secondary | ICD-10-CM | POA: Diagnosis present

## 2023-08-24 DIAGNOSIS — F0390 Unspecified dementia without behavioral disturbance: Secondary | ICD-10-CM | POA: Diagnosis not present

## 2023-08-24 DIAGNOSIS — D638 Anemia in other chronic diseases classified elsewhere: Secondary | ICD-10-CM | POA: Diagnosis not present

## 2023-08-24 LAB — CBC
HCT: 26 % — ABNORMAL LOW (ref 39.0–52.0)
Hemoglobin: 8.2 g/dL — ABNORMAL LOW (ref 13.0–17.0)
MCH: 34.2 pg — ABNORMAL HIGH (ref 26.0–34.0)
MCHC: 31.5 g/dL (ref 30.0–36.0)
MCV: 108.3 fL — ABNORMAL HIGH (ref 80.0–100.0)
Platelets: 86 10*3/uL — ABNORMAL LOW (ref 150–400)
RBC: 2.4 MIL/uL — ABNORMAL LOW (ref 4.22–5.81)
RDW: 16.4 % — ABNORMAL HIGH (ref 11.5–15.5)
WBC: 7.5 10*3/uL (ref 4.0–10.5)
nRBC: 0 % (ref 0.0–0.2)

## 2023-08-24 LAB — BASIC METABOLIC PANEL
Anion gap: 8 (ref 5–15)
BUN: 20 mg/dL (ref 8–23)
CO2: 24 mmol/L (ref 22–32)
Calcium: 9.2 mg/dL (ref 8.9–10.3)
Chloride: 104 mmol/L (ref 98–111)
Creatinine, Ser: 0.98 mg/dL (ref 0.61–1.24)
GFR, Estimated: >60 mL/min (ref >60)
Glucose, Bld: 149 mg/dL — ABNORMAL HIGH (ref 70–99)
Potassium: 3.2 mmol/L — ABNORMAL LOW (ref 3.5–5.1)
Sodium: 136 mmol/L (ref 135–145)

## 2023-08-24 LAB — VITAMIN D 25 HYDROXY (VIT D DEFICIENCY, FRACTURES): Vit D, 25-Hydroxy: 19.31 ng/mL — ABNORMAL LOW (ref 30–100)

## 2023-08-24 MED ORDER — OXYCODONE HCL 5 MG PO TABS
5.0000 mg | ORAL_TABLET | ORAL | Status: DC | PRN
Start: 2023-08-24 — End: 2023-08-26

## 2023-08-24 MED ORDER — HYDROMORPHONE HCL 1 MG/ML IJ SOLN: 0.5000 mg | INTRAMUSCULAR | Status: DC | PRN

## 2023-08-24 MED ORDER — ALUM & MAG HYDROXIDE-SIMETH 200-200-20 MG/5ML PO SUSP: 15.0000 mL | Freq: Four times a day (QID) | ORAL | Status: DC | PRN

## 2023-08-24 NOTE — Consult Note (Signed)
Reason for Consult: Right hip pain/periprosthetic right femur fracture Referring Physician: Irene Limbo, MD (Hospitalist)  Andrew Ramos is an 89 y.o. male.  HPI: Andrew Ramos is a 89 y.o. male with PMH of dementia, MDS, PMR, bilateral THA, PAF not on AC, BPH, prostate cancer, melanoma and impaired hearing brought to ED from ALF due to persistent right hip pain with weightbearing after his fall 2 days prior.   He does have a history of dementia.  On examination in the morning he does not report significant concerns or complaints of pain in his upper extremities or his left lower extremity.  He does have a history of left total hip replacement in addition to his right total hip placement.  He did have a fall and was noted to have a previous left periprosthetic greater trochanteric femur fracture treated nonoperatively with healing.  Past Medical History:  Diagnosis Date   Chronic myelomonocytic leukemia (HCC) 11/25/2020   Colon polyps    Tubular Adenoma 2005   Diverticulosis    Goals of care, counseling/discussion 11/25/2020   Hepatitis    [facility] told by [facility] has hepatitis antibodies in blood    Hyperlipidemia    Hypertension    Low grade myelodysplastic syndrome lesions (HCC) 10/26/2020   Melanoma (HCC)    Nonmelanoma skin cancer    Osteoarthritis    right hip- none since hip replacement   Polymyalgia rheumatica (HCC)    Polyposis of colon    Prostate cancer (HCC) 2007   s/p radiation seed implants   Prostate cancer [facility])    Thyroid disease sees dr altzhimer for yearly   nodules    Past Surgical History:  Procedure Laterality Date   CATARACT EXTRACTION     POLYPECTOMY     TONSILLECTOMY  as child   TOTAL HIP ARTHROPLASTY  08/27/2008   left   TOTAL HIP ARTHROPLASTY  06/12/2012   Procedure: TOTAL HIP ARTHROPLASTY ANTERIOR APPROACH;  Surgeon: Shelda Pal, MD;  Location: [facility];  Service: Orthopedics;  Laterality: Right;   TOTAL KNEE ARTHROPLASTY  12/31/2008   left    Family  History  Problem Relation Age of Onset   Coronary artery disease Mother 70   Heart disease Mother 58       MI   Coronary artery disease Father 68   Pancreatic cancer Father 51       deceased 13   Pancreatic cancer Brother 74       deceased 79; smoker   Colon cancer Neg Hx    Colon polyps Neg Hx    Esophageal cancer Neg Hx    Kidney disease Neg Hx    Diabetes Neg Hx    Gallbladder disease Neg Hx     Social History:  reports that he has never smoked. He has never used smokeless tobacco. He reports current alcohol use. He reports that he does not use drugs.  Allergies: No Known Allergies  Medications: I have reviewed the patient's current medications. Scheduled:  acetaminophen  1,000 mg Oral Q8H   atorvastatin  40 mg Oral QPM   calcium-vitamin D  1 tablet Oral Q breakfast   donepezil  23 mg Oral Daily   feeding supplement  237 mL Oral BID BM   heparin  5,000 Units Subcutaneous Q8H   lisinopril  20 mg Oral Daily   And   hydrochlorothiazide  12.5 mg Oral Daily   hydroxychloroquine  200 mg Oral BID   loratadine  10 mg Oral Daily  multivitamin with minerals  1 tablet Oral QPM   senna  1 tablet Oral BID   terazosin  1 mg Oral QHS   venlafaxine XR  150 mg Oral Q breakfast    Results for orders placed or performed during the hospital encounter of 08/23/23 (from the past 24 hour(s))  Basic metabolic panel     Status: Abnormal   Collection Time: 08/23/23  4:29 PM  Result Value Ref Range   Sodium 141 135 - 145 mmol/L   Potassium 3.5 3.5 - 5.1 mmol/L   Chloride 109 98 - 111 mmol/L   CO2 24 22 - 32 mmol/L   Glucose, Bld 100 (H) 70 - 99 mg/dL   BUN 18 8 - 23 mg/dL   Creatinine, Ser 6.04 0.61 - 1.24 mg/dL   Calcium 9.1 8.9 - 54.0 mg/dL   GFR, Estimated >98 >11 mL/min   Anion gap 8 5 - 15  CBC with Differential     Status: Abnormal   Collection Time: 08/23/23  4:29 PM  Result Value Ref Range   WBC 6.7 4.0 - 10.5 K/uL   RBC 2.33 (L) 4.22 - 5.81 MIL/uL   Hemoglobin 8.1 (L)  13.0 - 17.0 g/dL   HCT 91.4 (L) 78.2 - 95.6 %   MCV 110.3 (H) 80.0 - 100.0 fL   MCH 34.8 (H) 26.0 - 34.0 pg   MCHC 31.5 30.0 - 36.0 g/dL   RDW 21.3 (H) 08.6 - 57.8 %   Platelets 98 (L) 150 - 400 K/uL   nRBC 0.0 0.0 - 0.2 %   Neutrophils Relative % 28 %   Neutro Abs 1.9 1.7 - 7.7 K/uL   Lymphocytes Relative 29 %   Lymphs Abs 1.9 0.7 - 4.0 K/uL   Monocytes Relative 42 %   Monocytes Absolute 2.9 (H) 0.1 - 1.0 K/uL   Eosinophils Relative 0 %   Eosinophils Absolute 0.0 0.0 - 0.5 K/uL   Basophils Relative 0 %   Basophils Absolute 0.0 0.0 - 0.1 K/uL   Immature Granulocytes 1 %   Abs Immature Granulocytes 0.05 0.00 - 0.07 K/uL  VITAMIN D 25 Hydroxy (Vit-D Deficiency, Fractures)     Status: Abnormal   Collection Time: 08/23/23  7:49 PM  Result Value Ref Range   Vit D, 25-Hydroxy 19.31 (L) 30 - 100 ng/mL  CBC     Status: Abnormal   Collection Time: 08/24/23  8:05 AM  Result Value Ref Range   WBC 7.5 4.0 - 10.5 K/uL   RBC 2.40 (L) 4.22 - 5.81 MIL/uL   Hemoglobin 8.2 (L) 13.0 - 17.0 g/dL   HCT 46.9 (L) 62.9 - 52.8 %   MCV 108.3 (H) 80.0 - 100.0 fL   MCH 34.2 (H) 26.0 - 34.0 pg   MCHC 31.5 30.0 - 36.0 g/dL   RDW 41.3 (H) 24.4 - 01.0 %   Platelets 86 (L) 150 - 400 K/uL   nRBC 0.0 0.0 - 0.2 %     X-ray: CLINICAL DATA:  88 year old male status post fall 2 days ago with hip pain.   EXAM: DG HIP (WITH OR WITHOUT PELVIS) 2-3V RIGHT   COMPARISON:  Pelvis radiographs 06/12/2012.   FINDINGS: AP views of the pelvis and right hip at 1055 hours. Chronic bilateral total hip arthroplasty. Chronic prostate brachytherapy. Bulky heterotopic ossification about the proximal left femur which has progressed since 2013. Pelvis appears stable and intact. SI joints are symmetric. Nonobstructed bowel-gas pattern.   Evidence of acute periprosthetic fracture  of the proximal right femur most apparent at the greater trochanter. Minimally displaced fracture. Maintained AP hip arthroplasty alignment  there.   IMPRESSION: 1. Suspicious for acute periprosthetic fracture at the proximal right femur intertrochanteric segment. 2. No other acute osseous abnormality identified about the pelvis.     Electronically Signed   By: Odessa Fleming M.D.  ROS: As noted per HPI but specifically related to this hospitalization is related to his history of prior right total hip replacement. History of dementia with current residence in a assisted living facility.  Blood pressure (!) 130/59, pulse (!) 55, temperature (!) 97.5 F (36.4 C), resp. rate 19, height 5\' 11"  (1.803 m), weight 75.8 kg, SpO2 99%.  Physical Exam: This morning I awoke from his sleep.  He was alert but not oriented to our conversation.  GENERAL: No apparent distress.  Nontoxic. HEENT: MMM.  Vision grossly intact.  Impaired hearing. NECK: Supple.  No apparent JVD.  RESP:  No IWOB.  Fair aeration bilaterally. CVS:  RRR. Heart sounds normal.  ABD/GI/GU: BS+. Abd soft, NTND.  MSK/EXT:   No apparent deformity.  Significant tenderness over right  hip region.  Neurovascular intact. SKIN: no apparent skin lesion or wound NEURO: Awake and alert. Oriented appropriately.  No apparent focal neuro deficit. PSYCH: Calm. Normal affect.   Right hip exam: Tenderness over the lateral side of the right hip He does demonstrate the ability to actively hip flex with mild discomfort.  Range of motion of the right hip is deferred.  No evidence of any ecchymosis involving his left lower extremity or bilateral upper extremities.  No obvious deformities noted.  Assessment/Plan: 1.  Nondisplaced right proximal periprosthetic femur fracture involving the greater trochanter without evidence of femoral component subsidence  Plan: Today I had a chance to review the radiographs in addition to his exam findings.  He has a stable femoral component without evidence of subsidence.  There is no significant displacement of the trochanter.  This will be treated  nonoperatively.  Recommendations would generally include having him be partial weightbearing with no active abduction of the right lower extremity.  I recognized the challenges and compliance with restricted activities.  We will make certain that he has follow-up with [place] in 3 to 4 weeks. He is admitted for pain control as well as assessment with physical therapy about his mobility concerns and subsequent recommendations for his current nursing facility.  Shelda Pal 08/24/2023, 8:53 AM

## 2023-08-24 NOTE — Hospital Course (Addendum)
89 year old man, 26-year veteran of the [employer]	[place], PMH including dementia, MDS, PMR, presented from ALF after persistent right hip pain with weightbearing after a fall 2 days prior.  Imaging revealed acute periprosthetic fracture right femur intertrochanteric segment.  Seen by orthopedics with recommendation for nonoperative treatment and pain control.  [facility]   Procedures None

## 2023-08-24 NOTE — Plan of Care (Signed)

## 2023-08-24 NOTE — Evaluation (Signed)
Occupational Therapy Evaluation Patient Details Name: Andrew Ramos MRN: [id number] DOB: 12-30-1931 Today's Date: 08/24/2023   History of Present Illness 89 y.o. male brought to ED from ALF due to persistent right hip pain with weightbearing after his fall 2 days prior.  Pt found to have  Right hip pain/periprosthetic right femur fracture and orthopaedics consulted.  Ortho recommending pt be treated nonoperatively.  "Recommendations would generally include having him be partial weightbearing with no active abduction of the right lower extremity."  PMH of dementia, MDS, PMR, bilateral THA, PAF not on AC, BPH, prostate cancer, melanoma and impaired hearing   Clinical Impression   Patient admitted for the diagnosis above.  PTA he lives at a local ALF with his spouse.  Patient had been using a RW for mobility to the dinning room, but continued to complete his own ADL.  Patient expresses only minimal pain, and is moving fairly well.  Patient needed cues for weight bearing status and precaution regarding no Abd.  Patient transferred back to the bed leading with his left leg.  Patient is very hard of hearing.  OT will continue efforts in the acute setting and Patient will benefit from continued inpatient follow up therapy, <3 hours/day.        If plan is discharge home, recommend the following: Assist for transportation;Assistance with cooking/housework;A little help with walking and/or transfers;A little help with bathing/dressing/bathroom    Functional Status Assessment  Patient has had a recent decline in their functional status and demonstrates the ability to make significant improvements in function in a reasonable and predictable amount of time.  Equipment Recommendations  None recommended by OT    Recommendations for Other Services       Precautions / Restrictions Precautions Precautions: Fall;Other (comment) Precaution Comments: no active hip abduction Restrictions Weight Bearing  Restrictions: Yes RLE Weight Bearing: Partial weight bearing      Mobility Bed Mobility Overal bed mobility: Needs Assistance Bed Mobility: Sit to Supine       Sit to supine: Min assist, Mod assist        Transfers Overall transfer level: Needs assistance Equipment used: Rolling walker (2 wheels) Transfers: Sit to/from Stand, Bed to chair/wheelchair/BSC Sit to Stand: Min assist     Step pivot transfers: Min assist     General transfer comment: transferred to the left      Balance Overall balance assessment: History of Falls, Needs assistance Sitting-balance support: Feet supported Sitting balance-Leahy Scale: Good     Standing balance support: Reliant on assistive device for balance Standing balance-Leahy Scale: Poor                             ADL either performed or assessed with clinical judgement   ADL Overall ADL's : Needs assistance/impaired Eating/Feeding: Independent;Sitting   Grooming: Set up;Wash/dry hands;Wash/dry face;Sitting   Upper Body Bathing: Set up;Sitting   Lower Body Bathing: Moderate assistance;Sit to/from stand   Upper Body Dressing : Set up;Sitting   Lower Body Dressing: Moderate assistance;Sit to/from stand   Toilet Transfer: Minimal assistance;Rolling walker (2 wheels);BSC/3in1                   Vision Baseline Vision/History: 1 Wears glasses Patient Visual Report: No change from baseline       Perception Perception: Not tested       Praxis Praxis: Not tested       Pertinent Vitals/Pain Pain Assessment Pain  Assessment: Faces Faces Pain Scale: Hurts a little bit Pain Location: right hip Pain Descriptors / Indicators: Sore Pain Intervention(s): Monitored during session     Extremity/Trunk Assessment Upper Extremity Assessment Upper Extremity Assessment: Overall WFL for tasks assessed   Lower Extremity Assessment Lower Extremity Assessment: Defer to PT evaluation   Cervical / Trunk  Assessment Cervical / Trunk Assessment: Kyphotic   Communication Communication Communication: Hearing impairment   Cognition Arousal: Alert Behavior During Therapy: WFL for tasks assessed/performed Overall Cognitive Status: History of cognitive impairments - at baseline                                       General Comments   VSS on RA    Exercises     Shoulder Instructions      Home Living Family/patient expects to be discharged to:: Assisted living                             Home Equipment: Rolling Walker (2 wheels);Cane - single point   Additional Comments: lives with spouse and 39 year old dog      Prior Functioning/Environment Prior Level of Function : Independent/Modified Independent             Mobility Comments: using cane prior to fall, states PT at facility got him a RW after his fall; ambulates to dining hall ADLs Comments: Continued to complete his own ADL        OT Problem List: Decreased strength;Decreased range of motion;Impaired balance (sitting and/or standing);Pain;Decreased safety awareness      OT Treatment/Interventions: Self-care/ADL training;Therapeutic activities;Patient/family education;Balance training;DME and/or AE instruction    OT Goals(Current goals can be found in the care plan section) Acute Rehab OT Goals Patient Stated Goal: Return home OT Goal Formulation: With patient Time For Goal Achievement: 09/07/23 Potential to Achieve Goals: Good ADL Goals Pt Will Perform Grooming: standing Pt Will Perform Lower Body Dressing: with min assist;sit to/from stand Pt Will Transfer to Toilet: with supervision;ambulating;regular height toilet  OT Frequency: Min 1X/week    Co-evaluation              AM-PAC OT "6 Clicks" Daily Activity     Outcome Measure Help from another person eating meals?: None Help from another person taking care of personal grooming?: None Help from another person toileting,  which includes using toliet, bedpan, or urinal?: A Little Help from another person bathing (including washing, rinsing, drying)?: A Little Help from another person to put on and taking off regular upper body clothing?: None Help from another person to put on and taking off regular lower body clothing?: A Little 6 Click Score: 21   End of Session Equipment Utilized During Treatment: Rolling walker (2 wheels) Nurse Communication: Mobility status  Activity Tolerance: Patient tolerated treatment well Patient left: in bed;with call bell/phone within reach;with bed alarm set  OT Visit Diagnosis: Unsteadiness on feet (R26.81);Pain;History of falling (Z91.81) Pain - Right/Left: Right Pain - part of body: Hip                Time: 1610-1630 OT Time Calculation (min): 20 min Charges:  OT General Charges $OT Visit: 1 Visit OT Evaluation $OT Eval Moderate Complexity: 1 Mod  08/24/2023  RP, OTR/L  Acute Rehabilitation Services  Office:  [phone]   Suzanna Obey 08/24/2023, 4:44 PM

## 2023-08-24 NOTE — Progress Notes (Signed)
  Progress Note   Patient: Andrew Ramos ZOX:[id number] DOB: [birth year] DOA: 08/23/2023     0 DOS: the patient was seen and examined on 08/24/2023   Brief hospital course: 89 year old man, 26-year veteran of the [employer]	[place], PMH including dementia, MDS, PMR, presented from ALF after persistent right hip pain with weightbearing after a fall 2 days prior.  Imaging revealed acute periprosthetic fracture right femur intertrochanteric segment.  Seen by orthopedics with recommendation for nonoperative treatment and pain control.  [facility]   Procedures None   Assessment and Plan: Nondisplaced right proximal periprosthetic femur fracture involving the greater trochanter without evidence of femoral component subsidence  Status post fall at ALF without prodrome.  No suspicious features articulated.   Per orthopedics, treatment is nonoperative with recommendation for partial weightbearing with no active abduction of the right lower extremity.  Follow-up with Dr. Charlann Boxer in 3 to 4 weeks.  Plan is therapy evaluations and pain control.  Hopefully can return to ALF.  Essential hypertension Stable.  Continue lisinopril and hydrochlorothiazide.   PMR Can continue Plaquenil.   Mood disorder: Stable Continue venlafaxine   Hyperlipidemia  MDS Anemia Thrombocytopenia Hgb stable 8.2.  Follow-up as an outpatient. Plts 98 > 86, chronic.  Dementia: Stable. Continue Aricept      Subjective:  Some hip pain "Don't call me sir, call me Nadine Counts"  Physical Exam: Vitals:   08/23/23 2219 08/24/23 0154 08/24/23 0608 08/24/23 0839  BP: 131/65 (!) 121/92 (!) 125/57 (!) 130/59  Pulse: 61 91 (!) 59 (!) 55  Resp: 18 18 18 19   Temp: 98.1 F (36.7 C) (!) 97.5 F (36.4 C) (!) 97.4 F (36.3 C) (!) 97.5 F (36.4 C)  TempSrc: Oral Oral    SpO2: 99% 99% 98% 99%  Weight:      Height:       Physical Exam Vitals reviewed.  Constitutional:      General: He is not in acute distress.     Appearance: He is not ill-appearing or toxic-appearing.  Cardiovascular:     Rate and Rhythm: Normal rate and regular rhythm.     Heart sounds: No murmur heard. Pulmonary:     Effort: Pulmonary effort is normal. No respiratory distress.     Breath sounds: No wheezing or rales.  Neurological:     Mental Status: He is alert.     Data Reviewed: BMP K+ 3.2 Hgb stable 8.2 Plts 98 > 86  Family Communication: none  Disposition: Status is: Observation      Time spent: 35 minutes  Author: Brendia Sacks, MD 08/24/2023 11:17 AM  For on call review [email or web address].

## 2023-08-24 NOTE — Evaluation (Signed)
Physical Therapy Evaluation Patient Details Name: Andrew Ramos MRN: [id number] DOB: 1932-01-28 Today's Date: 08/24/2023  History of Present Illness  89 y.o. male brought to ED from ALF due to persistent right hip pain with weightbearing after his fall 2 days prior.  Pt found to have  Right hip pain/periprosthetic right femur fracture and orthopaedics consulted.  Ortho recommending pt be treated nonoperatively.  "Recommendations would generally include having him be partial weightbearing with no active abduction of the right lower extremity."  PMH of dementia, MDS, PMR, bilateral THA, PAF not on AC, BPH, prostate cancer, melanoma and impaired hearing  Clinical Impression  Pt admitted with above diagnosis.  Pt currently with functional limitations due to the deficits listed below (see PT Problem List). Pt will benefit from acute skilled PT to increase their independence and safety with mobility to allow discharge.  Pt currently requiring at least min assist to mobilize at this time.  Pt very pleasant and cooperative.  Pt provided with cues and education on no active hip abduction and PWB however will likely require repeated prompts/cues to maintain.  Pt unsteady at times in standing and ambulating requiring assist to prevent fall.  Pt typically able to ambulate to dining hall using [facility].  Patient will benefit from continued inpatient follow up therapy, <3 hours/day.           If plan is discharge home, recommend the following: A little help with walking and/or transfers;A little help with bathing/dressing/bathroom;Assistance with cooking/housework;Help with stairs or ramp for entrance;Assist for transportation   Can travel by private vehicle        Equipment Recommendations None recommended by PT  Recommendations for Other Services       Functional Status Assessment Patient has had a recent decline in their functional status and demonstrates the ability to make significant improvements in  function in a reasonable and predictable amount of time.     Precautions / Restrictions Precautions Precautions: Fall;Other (comment) Precaution Comments: no active hip abduction Restrictions Weight Bearing Restrictions: Yes RLE Weight Bearing: Partial weight bearing RLE Partial Weight Bearing Percentage or Pounds:  (% not yet specified - PT consult pending)      Mobility  Bed Mobility Overal bed mobility: Needs Assistance Bed Mobility: Supine to Sit     Supine to sit: Mod assist     General bed mobility comments: assist for LEs due to weakness and to maintain no active hip abduction    Transfers Overall transfer level: Needs assistance Equipment used: Rolling walker (2 wheels) Transfers: Sit to/from Stand Sit to Stand: Min assist           General transfer comment: assist to rise and steady; from bed and toilet; cues for positioning and technique    Ambulation/Gait Ambulation/Gait assistance: Min assist Gait Distance (Feet): 100 Feet Assistive device: Rolling walker (2 wheels) Gait Pattern/deviations: Step-to pattern, Step-through pattern, Trunk flexed Gait velocity: decr     General Gait Details: verbal cues for RW positioning and PWB status; initially incontinent of stool (pt unaware) so ambulated to bathroom prior to ambulating into hallway; requiring min assist for stability (3-4x during ambulation)  Stairs            Wheelchair Mobility     Tilt Bed    Modified Rankin (Stroke Patients Only)       Balance Overall balance assessment: History of Falls, Needs assistance         Standing balance support: Bilateral upper extremity supported, Reliant on  assistive device for balance, During functional activity Standing balance-Leahy Scale: Poor                               Pertinent Vitals/Pain Pain Assessment Pain Assessment: Faces Faces Pain Scale: Hurts a little bit Pain Location: right hip Pain Descriptors / Indicators:  Sore Pain Intervention(s): Repositioned, Monitored during session    Home Living Family/patient expects to be discharged to:: Other (Comment) ([facility] ([facility]?))                 Home Equipment: Agricultural consultant (2 wheels);Cane - single point Additional Comments: lives with spouse and 86 year old dog    Prior Function Prior Level of Function : Independent/Modified Independent             Mobility Comments: using cane prior to fall, states PT at facility got him a RW after his fall; ambulates to dining hall       Extremity/Trunk Assessment        Lower Extremity Assessment Lower Extremity Assessment: RLE deficits/detail;Generalized weakness RLE Deficits / Details: cued for maintaining no active hip abduction precaution,    Cervical / Trunk Assessment Cervical / Trunk Assessment: Kyphotic  Communication   Communication Communication: Hearing impairment  Cognition Arousal: Alert Behavior During Therapy: WFL for tasks assessed/performed Overall Cognitive Status: History of cognitive impairments - at baseline                                 General Comments: history of dementia, appears oriented and able to describe PLOF and his fall        General Comments      Exercises     Assessment/Plan    PT Assessment Patient needs continued PT services  PT Problem List Decreased strength;Decreased activity tolerance;Decreased balance;Decreased mobility;Decreased knowledge of use of DME;Decreased knowledge of precautions       PT Treatment Interventions DME instruction;Gait training;Balance training;Functional mobility training;Therapeutic activities;Therapeutic exercise;Patient/family education    PT Goals (Current goals can be found in the Care Plan section)  Acute Rehab PT Goals PT Goal Formulation: With patient Time For Goal Achievement: 09/07/23 Potential to Achieve Goals: Good    Frequency Min 1X/week     Co-evaluation                AM-PAC PT "6 Clicks" Mobility  Outcome Measure Help needed turning from your back to your side while in a flat bed without using bedrails?: A Lot Help needed moving from lying on your back to sitting on the side of a flat bed without using bedrails?: A Lot Help needed moving to and from a bed to a chair (including a wheelchair)?: A Lot Help needed standing up from a chair using your arms (e.g., wheelchair or bedside chair)?: A Lot Help needed to walk in hospital room?: A Lot Help needed climbing 3-5 steps with a railing? : Total 6 Click Score: 11    End of Session Equipment Utilized During Treatment: Gait belt Activity Tolerance: Patient tolerated treatment well Patient left: in chair;with call bell/phone within reach;with chair alarm set Nurse Communication: Mobility status PT Visit Diagnosis: Difficulty in walking, not elsewhere classified (R26.2);Muscle weakness (generalized) (M62.81)    Time: 1610-9604 PT Time Calculation (min) (ACUTE ONLY): 36 min   Charges:   PT Evaluation $PT Eval Low Complexity: 1 Low PT Treatments $Gait Training: 8-22  mins PT General Charges $$ ACUTE PT VISIT: 1 Visit        Thomasene Mohair PT, DPT Physical Therapist Acute Rehabilitation Services Office: [phone]   [name] 08/24/2023, 11:54 AM

## 2023-08-25 DIAGNOSIS — M9701XA Periprosthetic fracture around internal prosthetic right hip joint, initial encounter: Secondary | ICD-10-CM | POA: Diagnosis not present

## 2023-08-25 DIAGNOSIS — F0150 Vascular dementia without behavioral disturbance: Secondary | ICD-10-CM | POA: Diagnosis not present

## 2023-08-25 DIAGNOSIS — M353 Polymyalgia rheumatica: Secondary | ICD-10-CM | POA: Diagnosis not present

## 2023-08-25 NOTE — NC FL2 (Signed)
[place] MEDICAID FL2 LEVEL OF CARE FORM     IDENTIFICATION  Patient Name: Andrew Ramos Birthdate: 05-04-1932 Sex: male Admission Date (Current Location): 08/23/2023  [facility] and [place] Number:  Producer, television/film/video and Address:         Provider Number: [id number]  Attending Physician Name and Address:  Standley Brooking, MD  Relative Name and Phone Number:  son, Stanely Chapel @ [phone]    Current Level of Care: Hospital Recommended Level of Care: Skilled Nursing Facility Prior Approval Number:    Date Approved/Denied:   PASRR Number:    Discharge Plan: SNF    Current Diagnoses: Patient Active Problem List   Diagnosis Date Noted   Periprosthetic hip fracture 08/24/2023   Periprosthetic fracture around internal prosthetic right hip joint (HCC) 08/23/2023   Vascular dementia without behavioral disturbance (HCC) 06/29/2021   Antineoplastic chemotherapy induced pancytopenia (CODE) (HCC) 06/07/2021   DM (diabetes mellitus) type II, controlled, with peripheral vascular disorder (HCC) 06/07/2021   Paroxysmal supraventricular tachycardia (HCC) 06/07/2021   Iron deficiency anemia 03/14/2021   Polymyalgia rheumatica (HCC)    Osteoarthritis    Thyroid disease    Chronic myelomonocytic leukemia (HCC) 11/25/2020   Goals of care, counseling/discussion 11/25/2020   Low grade myelodysplastic syndrome lesions (HCC) 10/26/2020   Uncontrolled type 2 diabetes mellitus with hyperglycemia (HCC) 03/16/2020   Fatigue 03/16/2020   Rib pain on left side 05/27/2017   Closed left hip fracture (HCC) 09/29/2016   Fall    Periprosthetic fracture around internal prosthetic left hip joint (HCC)    Osteoporosis    Benign prostatic hyperplasia without lower urinary tract symptoms    Depression    Mild cognitive impairment 09/23/2016   Nuclear sclerotic cataract of left eye 04/02/2016   Cortical age-related cataract of both eyes 01/04/2016   Degenerative retinal drusen of both eyes  01/04/2016   Nuclear sclerotic cataract of both eyes 01/04/2016   Open angle with borderline findings and low glaucoma risk in both eyes 01/04/2016   Posterior vitreous detachment of both eyes 01/04/2016   Maxillary sinusitis, acute 11/03/2015   Diet-controlled diabetes mellitus (HCC) 04/06/2015   Acute upper respiratory infection 11/11/2014   Prostate cancer (HCC)    Melanoma (HCC)    Nonmelanoma skin cancer    Polyposis of colon    Influenza-like illness 09/30/2013   Obesity (BMI 30-39.9) 05/10/2013   Obese 06/14/2012   S/P right THA, AA 06/12/2012   PMR (polymyalgia rheumatica) (HCC) 05/10/2011   External hemorrhoids 06/09/2010   Diverticulosis of colon 06/09/2010   History of colonic polyps 06/09/2010   Vitamin D deficiency 05/18/2010   TOTAL KNEE REPLACEMENT, LEFT, HX OF 01/22/2009   Hearing loss 03/13/2008   Thyroid nodule 08/14/2007   PROSTATE CANCER, HX OF 06/18/2007   Hyperlipidemia 02/28/2007   Essential hypertension 02/28/2007   Osteoarthritis 02/28/2007    Orientation RESPIRATION BLADDER Height & Weight     Place, Situation, Time, Self  Normal Continent Weight: 167 lb (75.8 kg) Height:  5\' 11"  (180.3 cm)  BEHAVIORAL SYMPTOMS/MOOD NEUROLOGICAL BOWEL NUTRITION STATUS      Continent Diet (regular)  AMBULATORY STATUS COMMUNICATION OF NEEDS Skin   Limited Assist Verbally Other (Comment) (surgical incision only)                       Personal Care Assistance Level of Assistance  Bathing, Dressing Bathing Assistance: Limited assistance   Dressing Assistance: Limited assistance     Functional Limitations  Info  Sight, Hearing, Speech Sight Info: Adequate Hearing Info: Adequate Speech Info: Adequate    SPECIAL CARE FACTORS FREQUENCY  PT (By licensed PT), OT (By licensed OT)     PT Frequency: 5x/wk OT Frequency: 5x/wk            Contractures Contractures Info: Not present    Additional Factors Info  Code Status, Allergies, Psychotropic Code  Status Info: Full Allergies Info: NKDA Psychotropic Info: see MAR         Current Medications (08/25/2023):  This is the current hospital active medication list Current Facility-Administered Medications  Medication Dose Route Frequency Provider Last Rate Last Admin   acetaminophen (TYLENOL) tablet 1,000 mg  1,000 mg Oral Q8H Gonfa, Taye T, MD   1,000 mg at 08/25/23 0643   alum & mag hydroxide-simeth (MAALOX/MYLANTA) 200-200-20 MG/5ML suspension 15 mL  15 mL Oral Q6H PRN Standley Brooking, MD       atorvastatin (LIPITOR) tablet 40 mg  40 mg Oral QPM Candelaria Stagers T, MD   40 mg at 08/24/23 1758   bisacodyl (DULCOLAX) suppository 10 mg  10 mg Rectal Daily PRN Almon Hercules, MD       calcium-vitamin D (OSCAL WITH D) 500-5 MG-MCG per tablet 1 tablet  1 tablet Oral Q breakfast Candelaria Stagers T, MD   1 tablet at 08/25/23 0810   donepezil (ARICEPT) tablet 23 mg  23 mg Oral Daily Gonfa, Taye T, MD   23 mg at 08/24/23 1009   feeding supplement (ENSURE ENLIVE / ENSURE PLUS) liquid 237 mL  237 mL Oral BID BM Gonfa, Taye T, MD   237 mL at 08/24/23 1435   heparin injection 5,000 Units  5,000 Units Subcutaneous Q8H Candelaria Stagers T, MD   5,000 Units at 08/25/23 0644   lisinopril (ZESTRIL) tablet 20 mg  20 mg Oral Daily Gonfa, Taye T, MD   20 mg at 08/24/23 1013   And   hydrochlorothiazide (HYDRODIURIL) tablet 12.5 mg  12.5 mg Oral Daily Gonfa, Taye T, MD   12.5 mg at 08/24/23 1013   HYDROmorphone (DILAUDID) injection 0.5 mg  0.5 mg Intravenous Q3H PRN Standley Brooking, MD       hydroxychloroquine (PLAQUENIL) tablet 200 mg  200 mg Oral BID Candelaria Stagers T, MD   200 mg at 08/24/23 2040   loratadine (CLARITIN) tablet 10 mg  10 mg Oral Daily Candelaria Stagers T, MD   10 mg at 08/24/23 1013   multivitamin with minerals tablet 1 tablet  1 tablet Oral QPM Candelaria Stagers T, MD   1 tablet at 08/24/23 1758   oxyCODONE (Oxy IR/ROXICODONE) immediate release tablet 5-10 mg  5-10 mg Oral Q4H PRN Standley Brooking, MD       senna  (SENOKOT) tablet 8.6 mg  1 tablet Oral BID Candelaria Stagers T, MD   8.6 mg at 08/24/23 1013   terazosin (HYTRIN) capsule 1 mg  1 mg Oral QHS Candelaria Stagers T, MD   1 mg at 08/24/23 2039   venlafaxine XR (EFFEXOR-XR) 24 hr capsule 150 mg  150 mg Oral Q breakfast Candelaria Stagers T, MD   150 mg at 08/25/23 1610     Discharge Medications: Please see discharge summary for a list of discharge medications.  Relevant Imaging Results:  Relevant Lab Results:   Additional Information SS# [id number]  Amada Jupiter, LCSW

## 2023-08-25 NOTE — TOC Progression Note (Addendum)
Transition of Care [facility]) - Progression Note    Patient Details  Name: Andrew Ramos MRN: [id number] Date of Birth: 1932/05/20  Transition of Care [facility]) CM/SW Contact  Amada Jupiter, LCSW Phone Number: 08/25/2023, 2:35 PM  Clinical Narrative:     Have reviewed SNF bed offers with pt and family and bed accepted at [facility] with plans to admit tomorrow.  Will alert weekend [facility] staff.    Expected Discharge Plan: Skilled Nursing Facility Barriers to Discharge: SNF Pending bed offer  Expected Discharge Plan and Services In-house Referral: Clinical Social Work   Post Acute Care Choice: Skilled Nursing Facility Living arrangements for the past 2 months: Independent Living Facility ([facility] [place])                                       Social Determinants of Health (SDOH) Interventions SDOH Screenings   Food Insecurity: No Food Insecurity (08/23/2023)  Housing: Patient Declined (08/23/2023)  Transportation Needs: No Transportation Needs (08/23/2023)  Utilities: Not At Risk (08/23/2023)  Depression (PHQ2-9): Low Risk  (05/29/2023)  Tobacco Use: Low Risk  (08/24/2023)    Readmission Risk Interventions    08/25/2023   11:22 AM  Readmission Risk Prevention Plan  Post Dischage Appt Complete  Medication Screening Complete  Transportation Screening Complete

## 2023-08-25 NOTE — TOC Initial Note (Signed)
Transition of Care [facility]) - Initial/Assessment Note    Patient Details  Name: Andrew Ramos MRN: [id number] Date of Birth: 02/27/1932  Transition of [facility]) CM/SW Contact:    Amada Jupiter, LCSW Phone Number: 08/25/2023, 11:25 AM  Clinical Narrative:                  Met with pt and have spoken with son to review dc plans.  Pt/ son confirm pt admitted from IL apt at [facility] where pt lives with his wife.  They have been managing pretty well but do have private caregivers a few hours each week mostly to assist wife.  Both aware that PT has recommended SNF for rehab due to fx/ mobility limitations and are agreeable.  Will begin bed search.   Expected Discharge Plan: Skilled Nursing Facility Barriers to Discharge: SNF Pending bed offer   Patient Goals and CMS Choice Patient states their goals for this hospitalization and ongoing recovery are:: return to ILF after rehab          Expected Discharge Plan and Services In-house Referral: Clinical Social Work   Post Acute Care Choice: Skilled Nursing Facility Living arrangements for the past 2 months: Independent Living Facility ([facility] [place])                                      Prior Living Arrangements/Services Living arrangements for the past 2 months: Marketing executive ([facility] [place]) Lives with:: Spouse Patient language and need for interpreter reviewed:: Yes Do you feel safe going back to the place where you live?: Yes      Need for Family Participation in Patient Care: No (Comment) Care giver support system in place?: Yes (comment)   Criminal Activity/Legal Involvement Pertinent to Current Situation/Hospitalization: No - Comment as needed  Activities of Daily Living   ADL Screening (condition at time of admission) Independently performs ADLs?: No Does the patient have a NEW difficulty with bathing/dressing/toileting/self-feeding that is expected to last >3 days?: Yes  (Initiates electronic notice to provider for possible OT consult) Does the patient have a NEW difficulty with getting in/out of bed, walking, or climbing stairs that is expected to last >3 days?: Yes (Initiates electronic notice to provider for possible PT consult) Does the patient have a NEW difficulty with communication that is expected to last >3 days?: Yes (Initiates electronic notice to provider for possible SLP consult) Is the patient deaf or have difficulty hearing?: No Does the patient have difficulty seeing, even when wearing glasses/contacts?: No Does the patient have difficulty concentrating, remembering, or making decisions?: No  Permission Sought/Granted Permission sought to share information with : Family Supports, Oceanographer granted to share information with : Yes, Verbal Permission Granted  Share Information with NAME: son, Brok Wilemon @ [phone]  Permission granted to share info w AGENCY: SNFs        Emotional Assessment Appearance:: Appears stated age Attitude/Demeanor/Rapport: Gracious, Engaged Affect (typically observed): Accepting Orientation: : Oriented to Self, Oriented to Place, Oriented to  Time, Oriented to Situation Alcohol / Substance Use: Not Applicable Psych Involvement: No (comment)  Admission diagnosis:  Periprosthetic fracture around internal prosthetic right hip joint (HCC) [W29.01XA] Fall, initial encounter [id number]] Periprosthetic fracture around internal prosthetic hip joint, initial encounter [F62.8XXA, Z96.649] Periprosthetic hip fracture [Z30.8XXA, Z96.649] Patient Active Problem List   Diagnosis Date Noted   Periprosthetic hip  fracture 08/24/2023   Periprosthetic fracture around internal prosthetic right hip joint (HCC) 08/23/2023   Vascular dementia without behavioral disturbance (HCC) 06/29/2021   Antineoplastic chemotherapy induced pancytopenia (CODE) (HCC) 06/07/2021   DM (diabetes mellitus) type II,  controlled, with peripheral vascular disorder (HCC) 06/07/2021   Paroxysmal supraventricular tachycardia (HCC) 06/07/2021   Iron deficiency anemia 03/14/2021   Polymyalgia rheumatica (HCC)    Osteoarthritis    Thyroid disease    Chronic myelomonocytic leukemia (HCC) 11/25/2020   Goals of care, counseling/discussion 11/25/2020   Low grade myelodysplastic syndrome lesions (HCC) 10/26/2020   Uncontrolled type 2 diabetes mellitus with hyperglycemia (HCC) 03/16/2020   Fatigue 03/16/2020   Rib pain on left side 05/27/2017   Closed left hip fracture (HCC) 09/29/2016   Fall    Periprosthetic fracture around internal prosthetic left hip joint (HCC)    Osteoporosis    Benign prostatic hyperplasia without lower urinary tract symptoms    Depression    Mild cognitive impairment 09/23/2016   Nuclear sclerotic cataract of left eye 04/02/2016   Cortical age-related cataract of both eyes 01/04/2016   Degenerative retinal drusen of both eyes 01/04/2016   Nuclear sclerotic cataract of both eyes 01/04/2016   Open angle with borderline findings and low glaucoma risk in both eyes 01/04/2016   Posterior vitreous detachment of both eyes 01/04/2016   Maxillary sinusitis, acute 11/03/2015   Diet-controlled diabetes mellitus (HCC) 04/06/2015   Acute upper respiratory infection 11/11/2014   Prostate cancer (HCC)    Melanoma (HCC)    Nonmelanoma skin cancer    Polyposis of colon    Influenza-like illness 09/30/2013   Obesity (BMI 30-39.9) 05/10/2013   Obese 06/14/2012   S/P right THA, AA 06/12/2012   PMR (polymyalgia rheumatica) (HCC) 05/10/2011   External hemorrhoids 06/09/2010   Diverticulosis of colon 06/09/2010   History of colonic polyps 06/09/2010   Vitamin D deficiency 05/18/2010   TOTAL KNEE REPLACEMENT, LEFT, HX OF 01/22/2009   Hearing loss 03/13/2008   Thyroid nodule 08/14/2007   PROSTATE CANCER, HX OF 06/18/2007   Hyperlipidemia 02/28/2007   Essential hypertension 02/28/2007    Osteoarthritis 02/28/2007   PCP:  Donato Schultz, DO Pharmacy:   [facility] - [place], [place] - [address] [place] [address] Phone: [phone] Fax: [phone]  [facility] #[id number] - [place], [place] - [address] AT [facility] OF [address] [place] [address] Phone: [phone] Fax: [phone]  [facility] #[id number] - [place], [place] - [address] AT [facility] OF [address] [place] [address] Phone: [phone] Fax: [phone]     Social Determinants of Health (SDOH) Social History: SDOH Screenings   Food Insecurity: No Food Insecurity (08/23/2023)  Housing: Patient Declined (08/23/2023)  Transportation Needs: No Transportation Needs (08/23/2023)  Utilities: Not At Risk (08/23/2023)  Depression (PHQ2-9): Low Risk  (05/29/2023)  Tobacco Use: Low Risk  (08/24/2023)   SDOH Interventions:     Readmission Risk Interventions    08/25/2023   11:22 AM  Readmission Risk Prevention Plan  Post Dischage Appt Complete  Medication Screening Complete  Transportation Screening Complete

## 2023-08-25 NOTE — Progress Notes (Signed)
  Progress Note   Patient: Andrew Ramos EAV:[id number] DOB: [birth year] DOA: 08/23/2023     1 DOS: the patient was seen and examined on 08/25/2023   Brief hospital course: 89 year old man, 26-year veteran of the [employer]	[place], PMH including dementia, MDS, PMR, presented from ALF after persistent right hip pain with weightbearing after a fall 2 days prior.  Imaging revealed acute periprosthetic fracture right femur intertrochanteric segment.  Seen by orthopedics with recommendation for nonoperative treatment and pain control.  [facility]ult[facility] Orthopedics   Procedures None   Assessment and Plan: Nondisplaced right proximal periprosthetic femur fracture involving the greater trochanter without evidence of femoral component subsidence  Status post fall at ALF without prodrome.  No suspicious features articulated.   Per orthopedics, treatment is nonoperative with recommendation for partial weightbearing with no active abduction of the right lower extremity.  Follow-up with Dr. Charlann Boxer in 3 to 4 weeks.  Plan is therapy evaluations and pain control.  Plan SNF. Patient medically stable.   Essential hypertension Remains stable.  Continue lisinopril and hydrochlorothiazide.   PMR Can continue Plaquenil.   Mood disorder: Stable Continue venlafaxine   Hyperlipidemia   MDS Anemia Thrombocytopenia Hgb stable.  Follow-up as an outpatient. Plts 98 > 86, chronic.   Dementia: Stable. Continue Aricept      Subjective:  Doing well, little pain Main issue is difficulty getting out of chair.  Physical Exam: Vitals:   08/24/23 0839 08/24/23 1259 08/24/23 2224 08/25/23 0522  BP: (!) 130/59 (!) 127/54 136/63 (!) 141/69  Pulse: (!) 55 (!) 59 74 74  Resp: 19 16 18 18   Temp: (!) 97.5 F (36.4 C) 98.3 F (36.8 C) 97.9 F (36.6 C) 97.6 F (36.4 C)  TempSrc:    Oral  SpO2: 99% 99% 98% 97%  Weight:      Height:       Physical Exam Vitals reviewed.  Constitutional:      General: He is not  in acute distress.    Appearance: He is not ill-appearing or toxic-appearing.  Cardiovascular:     Rate and Rhythm: Normal rate and regular rhythm.     Heart sounds: No murmur heard. Pulmonary:     Effort: Pulmonary effort is normal. No respiratory distress.     Breath sounds: No wheezing, rhonchi or rales.  Neurological:     Mental Status: He is alert.  Psychiatric:        Mood and Affect: Mood normal.        Behavior: Behavior normal.     Data Reviewed: No new data  Family Communication: none  Disposition: Status is: Inpatient Remains inpatient appropriate because: await SNF bed     Time spent: 20 minutes  Author: Brendia Sacks, MD 08/25/2023 8:21 AM  For on call review [email or web address].

## 2023-08-25 NOTE — NC FL2 (Signed)
[place] MEDICAID FL2 LEVEL OF CARE FORM     IDENTIFICATION  Patient Name: Andrew Ramos Birthdate: 08-12-1932 Sex: male Admission Date (Current Location): 08/23/2023  [facility] and [place] Number:  Producer, television/film/video and Address:  [facility],  [address] [place], [place] [address]      Provider Number: [id number]  Attending Physician Name and Address:  Standley Brooking, MD  Relative Name and Phone Number:  son, Baylee Whitehall @ [phone]    Current Level of Care: Hospital Recommended Level of Care: Skilled Nursing Facility Prior Approval Number:    Date Approved/Denied:   PASRR Number:    Discharge Plan: SNF    Current Diagnoses: Patient Active Problem List   Diagnosis Date Noted   Periprosthetic hip fracture 08/24/2023   Periprosthetic fracture around internal prosthetic right hip joint (HCC) 08/23/2023   Vascular dementia without behavioral disturbance (HCC) 06/29/2021   Antineoplastic chemotherapy induced pancytopenia (CODE) (HCC) 06/07/2021   DM (diabetes mellitus) type II, controlled, with peripheral vascular disorder (HCC) 06/07/2021   Paroxysmal supraventricular tachycardia (HCC) 06/07/2021   Iron deficiency anemia 03/14/2021   Polymyalgia rheumatica (HCC)    Osteoarthritis    Thyroid disease    Chronic myelomonocytic leukemia (HCC) 11/25/2020   Goals of care, counseling/discussion 11/25/2020   Low grade myelodysplastic syndrome lesions (HCC) 10/26/2020   Uncontrolled type 2 diabetes mellitus with hyperglycemia (HCC) 03/16/2020   Fatigue 03/16/2020   Rib pain on left side 05/27/2017   Closed left hip fracture (HCC) 09/29/2016   Fall    Periprosthetic fracture around internal prosthetic left hip joint (HCC)    Osteoporosis    Benign prostatic hyperplasia without lower urinary tract symptoms    Depression    Mild cognitive impairment 09/23/2016   Nuclear sclerotic cataract of left eye 04/02/2016   Cortical age-related cataract of both  eyes 01/04/2016   Degenerative retinal drusen of both eyes 01/04/2016   Nuclear sclerotic cataract of both eyes 01/04/2016   Open angle with borderline findings and low glaucoma risk in both eyes 01/04/2016   Posterior vitreous detachment of both eyes 01/04/2016   Maxillary sinusitis, acute 11/03/2015   Diet-controlled diabetes mellitus (HCC) 04/06/2015   Acute upper respiratory infection 11/11/2014   Prostate cancer (HCC)    Melanoma (HCC)    Nonmelanoma skin cancer    Polyposis of colon    Influenza-like illness 09/30/2013   Obesity (BMI 30-39.9) 05/10/2013   Obese 06/14/2012   S/P right THA, AA 06/12/2012   PMR (polymyalgia rheumatica) (HCC) 05/10/2011   External hemorrhoids 06/09/2010   Diverticulosis of colon 06/09/2010   History of colonic polyps 06/09/2010   Vitamin D deficiency 05/18/2010   TOTAL KNEE REPLACEMENT, LEFT, HX OF 01/22/2009   Hearing loss 03/13/2008   Thyroid nodule 08/14/2007   PROSTATE CANCER, HX OF 06/18/2007   Hyperlipidemia 02/28/2007   Essential hypertension 02/28/2007   Osteoarthritis 02/28/2007    Orientation RESPIRATION BLADDER Height & Weight     Place, Situation, Time, Self  Normal Continent Weight: 167 lb (75.8 kg) Height:  5\' 11"  (180.3 cm)  BEHAVIORAL SYMPTOMS/MOOD NEUROLOGICAL BOWEL NUTRITION STATUS      Continent Diet (regular)  AMBULATORY STATUS COMMUNICATION OF NEEDS Skin   Limited Assist Verbally Other (Comment) (surgical incision only)                       Personal Care Assistance Level of Assistance  Bathing, Dressing Bathing Assistance: Limited assistance   Dressing Assistance:  Limited assistance     Functional Limitations Info  Sight, Hearing, Speech Sight Info: Adequate Hearing Info: Adequate Speech Info: Adequate    SPECIAL CARE FACTORS FREQUENCY  PT (By licensed PT), OT (By licensed OT)     PT Frequency: 5x/wk OT Frequency: 5x/wk            Contractures Contractures Info: Not present    Additional  Factors Info  Code Status, Allergies, Psychotropic Code Status Info: Full Allergies Info: NKDA Psychotropic Info: see MAR         Current Medications (08/25/2023):  This is the current hospital active medication list Current Facility-Administered Medications  Medication Dose Route Frequency Provider Last Rate Last Admin   acetaminophen (TYLENOL) tablet 1,000 mg  1,000 mg Oral Q8H Gonfa, Taye T, MD   1,000 mg at 08/25/23 0643   alum & mag hydroxide-simeth (MAALOX/MYLANTA) 200-200-20 MG/5ML suspension 15 mL  15 mL Oral Q6H PRN Standley Brooking, MD       atorvastatin (LIPITOR) tablet 40 mg  40 mg Oral QPM Candelaria Stagers T, MD   40 mg at 08/24/23 1758   bisacodyl (DULCOLAX) suppository 10 mg  10 mg Rectal Daily PRN Almon Hercules, MD       calcium-vitamin D (OSCAL WITH D) 500-5 MG-MCG per tablet 1 tablet  1 tablet Oral Q breakfast Candelaria Stagers T, MD   1 tablet at 08/25/23 0810   donepezil (ARICEPT) tablet 23 mg  23 mg Oral Daily Gonfa, Taye T, MD   23 mg at 08/25/23 1124   feeding supplement (ENSURE ENLIVE / ENSURE PLUS) liquid 237 mL  237 mL Oral BID BM Gonfa, Taye T, MD   237 mL at 08/25/23 1127   heparin injection 5,000 Units  5,000 Units Subcutaneous Q8H Candelaria Stagers T, MD   5,000 Units at 08/25/23 0644   lisinopril (ZESTRIL) tablet 20 mg  20 mg Oral Daily Candelaria Stagers T, MD   20 mg at 08/25/23 1124   And   hydrochlorothiazide (HYDRODIURIL) tablet 12.5 mg  12.5 mg Oral Daily Gonfa, Taye T, MD   12.5 mg at 08/25/23 1124   HYDROmorphone (DILAUDID) injection 0.5 mg  0.5 mg Intravenous Q3H PRN Standley Brooking, MD       hydroxychloroquine (PLAQUENIL) tablet 200 mg  200 mg Oral BID Candelaria Stagers T, MD   200 mg at 08/25/23 1125   loratadine (CLARITIN) tablet 10 mg  10 mg Oral Daily Candelaria Stagers T, MD   10 mg at 08/25/23 1125   multivitamin with minerals tablet 1 tablet  1 tablet Oral QPM Candelaria Stagers T, MD   1 tablet at 08/24/23 1758   oxyCODONE (Oxy IR/ROXICODONE) immediate release tablet 5-10 mg  5-10  mg Oral Q4H PRN Standley Brooking, MD       senna (SENOKOT) tablet 8.6 mg  1 tablet Oral BID Candelaria Stagers T, MD   8.6 mg at 08/24/23 1013   terazosin (HYTRIN) capsule 1 mg  1 mg Oral QHS Candelaria Stagers T, MD   1 mg at 08/24/23 2039   venlafaxine XR (EFFEXOR-XR) 24 hr capsule 150 mg  150 mg Oral Q breakfast Candelaria Stagers T, MD   150 mg at 08/25/23 2956     Discharge Medications: Please see discharge summary for a list of discharge medications.  Relevant Imaging Results:  Relevant Lab Results:   Additional Information SS# [id number]  Amada Jupiter, LCSW

## 2023-08-25 NOTE — NC FL2 (Signed)
[place] MEDICAID FL2 LEVEL OF CARE FORM     IDENTIFICATION  Patient Name: Andrew Ramos Birthdate: 09-25-1931 Sex: male Admission Date (Current Location): 08/23/2023  [facility] and [place] Number:  Producer, television/film/video and Address:  [facility],  [address] [place], [place] [address]      Provider Number: [id number]  Attending Physician Name and Address:  Standley Brooking, MD  Relative Name and Phone Number:  son, Clark Damuth @ [phone]    Current Level of Care: Hospital Recommended Level of Care: Skilled Nursing Facility Prior Approval Number:    Date Approved/Denied:   PASRR Number: [id number]  Discharge Plan: SNF    Current Diagnoses: Patient Active Problem List   Diagnosis Date Noted   Periprosthetic hip fracture 08/24/2023   Periprosthetic fracture around internal prosthetic right hip joint (HCC) 08/23/2023   Vascular dementia without behavioral disturbance (HCC) 06/29/2021   Antineoplastic chemotherapy induced pancytopenia (CODE) (HCC) 06/07/2021   DM (diabetes mellitus) type II, controlled, with peripheral vascular disorder (HCC) 06/07/2021   Paroxysmal supraventricular tachycardia (HCC) 06/07/2021   Iron deficiency anemia 03/14/2021   Polymyalgia rheumatica (HCC)    Osteoarthritis    Thyroid disease    Chronic myelomonocytic leukemia (HCC) 11/25/2020   Goals of care, counseling/discussion 11/25/2020   Low grade myelodysplastic syndrome lesions (HCC) 10/26/2020   Uncontrolled type 2 diabetes mellitus with hyperglycemia (HCC) 03/16/2020   Fatigue 03/16/2020   Rib pain on left side 05/27/2017   Closed left hip fracture (HCC) 09/29/2016   Fall    Periprosthetic fracture around internal prosthetic left hip joint (HCC)    Osteoporosis    Benign prostatic hyperplasia without lower urinary tract symptoms    Depression    Mild cognitive impairment 09/23/2016   Nuclear sclerotic cataract of left eye 04/02/2016   Cortical age-related cataract  of both eyes 01/04/2016   Degenerative retinal drusen of both eyes 01/04/2016   Nuclear sclerotic cataract of both eyes 01/04/2016   Open angle with borderline findings and low glaucoma risk in both eyes 01/04/2016   Posterior vitreous detachment of both eyes 01/04/2016   Maxillary sinusitis, acute 11/03/2015   Diet-controlled diabetes mellitus (HCC) 04/06/2015   Acute upper respiratory infection 11/11/2014   Prostate cancer (HCC)    Melanoma (HCC)    Nonmelanoma skin cancer    Polyposis of colon    Influenza-like illness 09/30/2013   Obesity (BMI 30-39.9) 05/10/2013   Obese 06/14/2012   S/P right THA, AA 06/12/2012   PMR (polymyalgia rheumatica) (HCC) 05/10/2011   External hemorrhoids 06/09/2010   Diverticulosis of colon 06/09/2010   History of colonic polyps 06/09/2010   Vitamin D deficiency 05/18/2010   TOTAL KNEE REPLACEMENT, LEFT, HX OF 01/22/2009   Hearing loss 03/13/2008   Thyroid nodule 08/14/2007   PROSTATE CANCER, HX OF 06/18/2007   Hyperlipidemia 02/28/2007   Essential hypertension 02/28/2007   Osteoarthritis 02/28/2007    Orientation RESPIRATION BLADDER Height & Weight     Place, Situation, Time, Self  Normal Continent Weight: 167 lb (75.8 kg) Height:  5\' 11"  (180.3 cm)  BEHAVIORAL SYMPTOMS/MOOD NEUROLOGICAL BOWEL NUTRITION STATUS      Continent Diet (regular)  AMBULATORY STATUS COMMUNICATION OF NEEDS Skin   Limited Assist Verbally Other (Comment) (surgical incision only)                       Personal Care Assistance Level of Assistance  Bathing, Dressing Bathing Assistance: Limited assistance   Dressing Assistance: Limited  assistance     Functional Limitations Info  Sight, Hearing, Speech Sight Info: Adequate Hearing Info: Adequate Speech Info: Adequate    SPECIAL CARE FACTORS FREQUENCY  PT (By licensed PT), OT (By licensed OT)     PT Frequency: 5x/wk OT Frequency: 5x/wk            Contractures Contractures Info: Not present     Additional Factors Info  Code Status, Allergies, Psychotropic Code Status Info: Full Allergies Info: NKDA Psychotropic Info: see MAR         Current Medications (08/25/2023):  This is the current hospital active medication list Current Facility-Administered Medications  Medication Dose Route Frequency Provider Last Rate Last Admin   acetaminophen (TYLENOL) tablet 1,000 mg  1,000 mg Oral Q8H Gonfa, Taye T, MD   1,000 mg at 08/25/23 1347   alum & mag hydroxide-simeth (MAALOX/MYLANTA) 200-200-20 MG/5ML suspension 15 mL  15 mL Oral Q6H PRN Standley Brooking, MD       atorvastatin (LIPITOR) tablet 40 mg  40 mg Oral QPM Candelaria Stagers T, MD   40 mg at 08/24/23 1758   bisacodyl (DULCOLAX) suppository 10 mg  10 mg Rectal Daily PRN Almon Hercules, MD       calcium-vitamin D (OSCAL WITH D) 500-5 MG-MCG per tablet 1 tablet  1 tablet Oral Q breakfast Candelaria Stagers T, MD   1 tablet at 08/25/23 0810   donepezil (ARICEPT) tablet 23 mg  23 mg Oral Daily Gonfa, Taye T, MD   23 mg at 08/25/23 1124   feeding supplement (ENSURE ENLIVE / ENSURE PLUS) liquid 237 mL  237 mL Oral BID BM Gonfa, Taye T, MD   237 mL at 08/25/23 1127   heparin injection 5,000 Units  5,000 Units Subcutaneous Q8H Candelaria Stagers T, MD   5,000 Units at 08/25/23 1347   lisinopril (ZESTRIL) tablet 20 mg  20 mg Oral Daily Gonfa, Taye T, MD   20 mg at 08/25/23 1124   And   hydrochlorothiazide (HYDRODIURIL) tablet 12.5 mg  12.5 mg Oral Daily Gonfa, Taye T, MD   12.5 mg at 08/25/23 1124   HYDROmorphone (DILAUDID) injection 0.5 mg  0.5 mg Intravenous Q3H PRN Standley Brooking, MD       hydroxychloroquine (PLAQUENIL) tablet 200 mg  200 mg Oral BID Candelaria Stagers T, MD   200 mg at 08/25/23 1125   loratadine (CLARITIN) tablet 10 mg  10 mg Oral Daily Candelaria Stagers T, MD   10 mg at 08/25/23 1125   multivitamin with minerals tablet 1 tablet  1 tablet Oral QPM Candelaria Stagers T, MD   1 tablet at 08/24/23 1758   oxyCODONE (Oxy IR/ROXICODONE) immediate release  tablet 5-10 mg  5-10 mg Oral Q4H PRN Standley Brooking, MD       senna (SENOKOT) tablet 8.6 mg  1 tablet Oral BID Candelaria Stagers T, MD   8.6 mg at 08/24/23 1013   terazosin (HYTRIN) capsule 1 mg  1 mg Oral QHS Candelaria Stagers T, MD   1 mg at 08/24/23 2039   venlafaxine XR (EFFEXOR-XR) 24 hr capsule 150 mg  150 mg Oral Q breakfast Candelaria Stagers T, MD   150 mg at 08/25/23 1610     Discharge Medications: Please see discharge summary for a list of discharge medications.  Relevant Imaging Results:  Relevant Lab Results:   Additional Information SS# [id number]  Amada Jupiter, LCSW

## 2023-08-25 NOTE — Progress Notes (Signed)
30 Day PASRR Note   Patient Details  Name: Andrew Ramos Date of Birth: 11-24-1931   Transition of Care [facility]) CM/SW Contact:    Amada Jupiter, LCSW Phone Number: 08/25/2023, 11:16 AM  To Whom It May Concern:  Please be advised that this patient will require a short-term nursing home stay - anticipated 30 days or less for rehabilitation and strengthening.   The plan is for return home.

## 2023-08-25 NOTE — Plan of Care (Signed)

## 2023-08-25 NOTE — Plan of Care (Signed)
Problem: Education: Goal: Knowledge of General Education information will improve Description: Including pain rating scale, medication(s)/side effects and non-pharmacologic comfort measures Outcome: Progressing   Problem: Clinical Measurements: Goal: Ability to maintain clinical measurements within normal limits will improve Outcome: Progressing   Problem: Activity: Goal: Risk for activity intolerance will decrease Outcome: Progressing   Problem: Coping: Goal: Level of anxiety will decrease Outcome: Progressing   Problem: Pain Management: Goal: General experience of comfort will improve Outcome: Progressing   Problem: Skin Integrity: Goal: Risk for impaired skin integrity will decrease Outcome: Progressing   Problem: Safety: Goal: Ability to remain free from injury will improve Outcome: Progressing   Problem: Elimination: Goal: Will not experience complications related to bowel motility Outcome: Progressing   Haydee Salter, RN 08/25/23 2:49 PM

## 2023-08-25 NOTE — Progress Notes (Signed)
Physical Therapy Treatment Patient Details Name: Andrew Ramos MRN: [id number] DOB: [birth year] Today's Date: 08/25/2023   History of Present Illness 89 y.o. male brought to ED from ALF due to persistent right hip pain with weightbearing after his fall 2 days prior.  Pt found to have  Right hip pain/periprosthetic right femur fracture and orthopaedics consulted.  Ortho recommending pt be treated nonoperatively.  "Recommendations would generally include having him be partial weightbearing with no active abduction of the right lower extremity."  PMH of dementia, MDS, PMR, bilateral THA, PAF not on AC, BPH, prostate cancer, melanoma and impaired hearing    PT Comments  Pt requesting assist to use bathroom (forgot he was wearing condom catheter).   Pt agreeable to OOB however felt unable to ambulate today.  Pt requiring mod assist to stand and transfer to recliner.  D/c recommendations remain appropriate.  Patient will benefit from continued inpatient follow up therapy, <3 hours/day.     If plan is discharge home, recommend the following: A little help with walking and/or transfers;A little help with bathing/dressing/bathroom;Assistance with cooking/housework;Help with stairs or ramp for entrance;Assist for transportation   Can travel by private vehicle        Equipment Recommendations  None recommended by PT    Recommendations for Other Services       Precautions / Restrictions Precautions Precautions: Fall;Other (comment) Precaution Comments: no active hip abduction Restrictions Weight Bearing Restrictions: Yes RLE Weight Bearing: Partial weight bearing     Mobility  Bed Mobility Overal bed mobility: Needs Assistance Bed Mobility: Supine to Sit     Supine to sit: Mod assist     General bed mobility comments: assist for LEs due to weakness and to maintain no active hip abduction    Transfers Overall transfer level: Needs assistance Equipment used: Rolling walker (2  wheels) Transfers: Sit to/from Stand, Bed to chair/wheelchair/BSC Sit to Stand: Mod assist, +2 safety/equipment   Step pivot transfers: Mod assist       General transfer comment: verbal cues for UE and LE positioning and maintaining PWB, requiring more physical assist due to weakness and pain; pt did not feel able to tolerate ambulation    Ambulation/Gait                   Stairs             Wheelchair Mobility     Tilt Bed    Modified Rankin (Stroke Patients Only)       Balance                                            Cognition Arousal: Alert Behavior During Therapy: WFL for tasks assessed/performed Overall Cognitive Status: History of cognitive impairments - at baseline                                 General Comments: history of dementia        Exercises      General Comments        Pertinent Vitals/Pain Pain Assessment Pain Assessment: Faces Faces Pain Scale: Hurts even more Pain Location: right hip Pain Descriptors / Indicators: Sore, Aching Pain Intervention(s): Repositioned, Monitored during session    Home Living  Prior Function            PT Goals (current goals can now be found in the care plan section) Progress towards PT goals: Progressing toward goals    Frequency    Min 1X/week      PT Plan      Co-evaluation              AM-PAC PT "6 Clicks" Mobility   Outcome Measure  Help needed turning from your back to your side while in a flat bed without using bedrails?: A Lot Help needed moving from lying on your back to sitting on the side of a flat bed without using bedrails?: A Lot Help needed moving to and from a bed to a chair (including a wheelchair)?: A Lot Help needed standing up from a chair using your arms (e.g., wheelchair or bedside chair)?: A Lot Help needed to walk in hospital room?: A Lot Help needed climbing 3-5 steps with a  railing? : Total 6 Click Score: 11    End of Session Equipment Utilized During Treatment: Gait belt Activity Tolerance: Patient tolerated treatment well Patient left: in chair;with call bell/phone within reach;with chair alarm set Nurse Communication: Mobility status PT Visit Diagnosis: Difficulty in walking, not elsewhere classified (R26.2);Muscle weakness (generalized) (M62.81)     Time: 1324-4010 PT Time Calculation (min) (ACUTE ONLY): 15 min  Charges:    $Therapeutic Activity: 8-22 mins PT General Charges $$ ACUTE PT VISIT: 1 Visit                     Thomasene Mohair PT, DPT Physical Therapist Acute Rehabilitation Services Office: [phone]    [name] 08/25/2023, 4:21 PM

## 2023-08-26 DIAGNOSIS — M353 Polymyalgia rheumatica: Secondary | ICD-10-CM | POA: Diagnosis not present

## 2023-08-26 DIAGNOSIS — M9701XD Periprosthetic fracture around internal prosthetic right hip joint, subsequent encounter: Secondary | ICD-10-CM | POA: Diagnosis not present

## 2023-08-26 DIAGNOSIS — M6281 Muscle weakness (generalized): Secondary | ICD-10-CM | POA: Diagnosis not present

## 2023-08-26 DIAGNOSIS — R300 Dysuria: Secondary | ICD-10-CM | POA: Diagnosis not present

## 2023-08-26 DIAGNOSIS — F03B3 Unspecified dementia, moderate, with mood disturbance: Secondary | ICD-10-CM | POA: Diagnosis not present

## 2023-08-26 DIAGNOSIS — C9310 Chronic myelomonocytic leukemia not having achieved remission: Secondary | ICD-10-CM

## 2023-08-26 DIAGNOSIS — Z96641 Presence of right artificial hip joint: Secondary | ICD-10-CM | POA: Diagnosis not present

## 2023-08-26 DIAGNOSIS — F39 Unspecified mood [affective] disorder: Secondary | ICD-10-CM | POA: Diagnosis not present

## 2023-08-26 DIAGNOSIS — F32A Depression, unspecified: Secondary | ICD-10-CM | POA: Diagnosis not present

## 2023-08-26 DIAGNOSIS — M9701XA Periprosthetic fracture around internal prosthetic right hip joint, initial encounter: Secondary | ICD-10-CM | POA: Diagnosis not present

## 2023-08-26 DIAGNOSIS — S72001D Fracture of unspecified part of neck of right femur, subsequent encounter for closed fracture with routine healing: Secondary | ICD-10-CM | POA: Diagnosis not present

## 2023-08-26 DIAGNOSIS — R2689 Other abnormalities of gait and mobility: Secondary | ICD-10-CM | POA: Diagnosis not present

## 2023-08-26 DIAGNOSIS — F0150 Vascular dementia without behavioral disturbance: Secondary | ICD-10-CM | POA: Diagnosis not present

## 2023-08-26 DIAGNOSIS — Z9181 History of falling: Secondary | ICD-10-CM | POA: Diagnosis not present

## 2023-08-26 DIAGNOSIS — R2681 Unsteadiness on feet: Secondary | ICD-10-CM | POA: Diagnosis not present

## 2023-08-26 DIAGNOSIS — I10 Essential (primary) hypertension: Secondary | ICD-10-CM | POA: Diagnosis not present

## 2023-08-26 DIAGNOSIS — W19XXXD Unspecified fall, subsequent encounter: Secondary | ICD-10-CM | POA: Diagnosis not present

## 2023-08-26 DIAGNOSIS — I4891 Unspecified atrial fibrillation: Secondary | ICD-10-CM | POA: Diagnosis not present

## 2023-08-26 DIAGNOSIS — I469 Cardiac arrest, cause unspecified: Secondary | ICD-10-CM | POA: Diagnosis not present

## 2023-08-26 DIAGNOSIS — R29898 Other symptoms and signs involving the musculoskeletal system: Secondary | ICD-10-CM | POA: Diagnosis not present

## 2023-08-26 DIAGNOSIS — Z79899 Other long term (current) drug therapy: Secondary | ICD-10-CM | POA: Diagnosis not present

## 2023-08-26 DIAGNOSIS — E785 Hyperlipidemia, unspecified: Secondary | ICD-10-CM | POA: Diagnosis not present

## 2023-08-26 DIAGNOSIS — R509 Fever, unspecified: Secondary | ICD-10-CM | POA: Diagnosis not present

## 2023-08-26 DIAGNOSIS — R262 Difficulty in walking, not elsewhere classified: Secondary | ICD-10-CM | POA: Diagnosis not present

## 2023-08-26 DIAGNOSIS — D649 Anemia, unspecified: Secondary | ICD-10-CM | POA: Diagnosis not present

## 2023-08-26 DIAGNOSIS — E162 Hypoglycemia, unspecified: Secondary | ICD-10-CM | POA: Diagnosis not present

## 2023-08-26 DIAGNOSIS — Z7401 Bed confinement status: Secondary | ICD-10-CM | POA: Diagnosis not present

## 2023-08-26 DIAGNOSIS — C9210 Chronic myeloid leukemia, BCR/ABL-positive, not having achieved remission: Secondary | ICD-10-CM | POA: Diagnosis not present

## 2023-08-26 DIAGNOSIS — D6959 Other secondary thrombocytopenia: Secondary | ICD-10-CM | POA: Diagnosis not present

## 2023-08-26 DIAGNOSIS — D638 Anemia in other chronic diseases classified elsewhere: Secondary | ICD-10-CM | POA: Diagnosis not present

## 2023-08-26 DIAGNOSIS — E161 Other hypoglycemia: Secondary | ICD-10-CM | POA: Diagnosis not present

## 2023-08-26 DIAGNOSIS — I4510 Unspecified right bundle-branch block: Secondary | ICD-10-CM | POA: Diagnosis not present

## 2023-08-26 DIAGNOSIS — I499 Cardiac arrhythmia, unspecified: Secondary | ICD-10-CM | POA: Diagnosis not present

## 2023-08-26 MED ORDER — OXYCODONE HCL 5 MG PO TABS: 5.0000 mg | ORAL_TABLET | Freq: Three times a day (TID) | ORAL | 0 refills | Status: DC | PRN

## 2023-08-26 MED ORDER — ACETAMINOPHEN 500 MG PO TABS: 1000.0000 mg | ORAL_TABLET | Freq: Three times a day (TID) | ORAL | Status: AC

## 2023-08-26 NOTE — Plan of Care (Signed)
  Problem: Coping: Goal: Level of anxiety will decrease Outcome: Progressing   Problem: Pain Management: Goal: General experience of comfort will improve Outcome: Progressing   Problem: Safety: Goal: Ability to remain free from injury will improve Outcome: Progressing

## 2023-08-26 NOTE — Plan of Care (Signed)
  Problem: Education: Goal: Knowledge of General Education information will improve Description: Including pain rating scale, medication(s)/side effects and non-pharmacologic comfort measures Outcome: Progressing   Problem: Activity: Goal: Risk for activity intolerance will decrease Outcome: Progressing   Problem: Pain Management: Goal: General experience of comfort will improve Outcome: Progressing

## 2023-08-26 NOTE — Discharge Summary (Addendum)
Physician Discharge Summary   Patient: Andrew Ramos MRN: [id number] DOB: [birth year]  Admit date:     08/23/2023  Discharge date: 08/26/23  Discharge Physician: Brendia Sacks   PCP: Donato Schultz, DO   Recommendations at discharge:   Nondisplaced right proximal periprosthetic femur fracture involving the greater trochanter without evidence of femoral component subsidence  Per orthopedics, treatment is nonoperative with recommendation for partial weightbearing with no active abduction of the right lower extremity.  Follow-up with Dr. Charlann Boxer in 3 to 4 weeks.  Plan is therapy evaluations and pain control.  Has not required any analgesics other than Tylenol.  Can stop scheduled Tylenol anytime at discretion of SNF physician or APP.  Prescription for oxycodone included but would avoid using unless has severe pain.  Fall precautions. Plan SNF.    CML and multifactorial anemia.  Thrombocytopenia Suggest repeat CBC 12/10 or 11.  Follow-up platelets and hemoglobin.  Discharge Diagnoses: Principal Problem:   Periprosthetic fracture around internal prosthetic right hip joint (HCC) Active Problems:   PMR (polymyalgia rheumatica) (HCC)   Chronic myelomonocytic leukemia (HCC)   Polymyalgia rheumatica (HCC)   Vascular dementia without behavioral disturbance (HCC)   Periprosthetic hip fracture  Resolved Problems:   * No resolved hospital problems. [facility] Course: 89 year old man, 26-year veteran of the [employer]	[place], PMH including dementia, MDS, PMR, presented from ALF after persistent right hip pain with weightbearing after a fall 2 days prior.  Imaging revealed acute periprosthetic fracture right femur intertrochanteric segment.  Seen by orthopedics with recommendation for nonoperative treatment and pain control.  Was seen by therapy for recommendation for SNF.  Hospitalization was uncomplicated.  Consultants Orthopedics   Procedures None   Nondisplaced right proximal  periprosthetic femur fracture involving the greater trochanter without evidence of femoral component subsidence  Status post fall at ALF without prodrome.  No suspicious features articulated.   Per orthopedics, treatment is nonoperative with recommendation for partial weightbearing with no active abduction of the right lower extremity.  Follow-up with Dr. Charlann Boxer in 3 to 4 weeks.  Plan is therapy evaluations and pain control.  Has not required any analgesics other than Tylenol. Plan SNF. Patient medically stable.   Essential hypertension Remains stable.  Continue lisinopril and hydrochlorothiazide.   PMR Can continue Plaquenil.   Mood disorder: Stable Continue venlafaxine   Hyperlipidemia   CML and multifactorial anemia.  Thrombocytopenia Hgb stable.  Follow-up as an outpatient. Plts 98 > 86, chronic.   Dementia: Stable. Continue Aricept  Pain control - [employer] Controlled Substance Reporting System database was reviewed.   Disposition: Skilled nursing facility Diet recommendation:  Regular diet DISCHARGE MEDICATION: Allergies as of 08/26/2023   No Known Allergies      Medication List     TAKE these medications    acetaminophen 500 MG tablet Commonly known as: TYLENOL Take 2 tablets (1,000 mg total) by mouth every 8 (eight) hours for 7 days.   alendronate 70 MG tablet Commonly known as: FOSAMAX Take 70 mg by mouth once a week. On Saturdays   aspirin EC 81 MG tablet Take 81 mg by mouth daily as needed for fever, mild pain (pain score 1-3) or moderate pain (pain score 4-6). Swallow whole.   atorvastatin 40 MG tablet Commonly known as: LIPITOR Take 1 tablet (40 mg total) by mouth daily.   CALCIUM 600 + D PO Take 1 tablet by mouth every morning.   Centrum Silver Ultra Mens Tabs Take 1 tablet by mouth  every evening.   cholecalciferol 25 MCG (1000 UNIT) tablet Commonly known as: VITAMIN D3 Take 1,000 Units by mouth every morning.   docusate sodium 100 MG  capsule Commonly known as: COLACE Take 200 mg by mouth daily as needed for mild constipation.   donepezil 23 MG Tabs tablet Commonly known as: ARICEPT Take 1 tablet (23 mg total) by mouth daily.   Ensure Take 237 mLs by mouth daily as needed.   fluticasone 50 MCG/ACT nasal spray Commonly known as: FLONASE Place 2 sprays into both nostrils daily. What changed:  when to take this reasons to take this   hydroxychloroquine 200 MG tablet Commonly known as: PLAQUENIL Take 1 tablet by mouth 2 (two) times daily.   Iron (Ferrous Sulfate) 325 (65 Fe) MG Tabs Take 325 mg by mouth daily.   lisinopril-hydrochlorothiazide 20-12.5 MG tablet Commonly known as: Zestoretic Take 1 tablet by mouth daily.   loratadine 10 MG tablet Commonly known as: CLARITIN TAKE 1 TABLET DAILY What changed:  when to take this reasons to take this   oxyCODONE 5 MG immediate release tablet Commonly known as: Oxy IR/ROXICODONE Take 1 tablet (5 mg total) by mouth every 8 (eight) hours as needed for breakthrough pain (breakthrough severe pain not controlled by Tylenol).   terazosin 1 MG capsule Commonly known as: HYTRIN TAKE 1 CAPSULE AT BEDTIME   venlafaxine XR 150 MG 24 hr capsule Commonly known as: Effexor XR Take 1 capsule (150 mg total) by mouth daily with breakfast.        Contact information for follow-up providers     Durene Romans, MD Follow up in 4 week(s).   Specialty: Orthopedic Surgery Why: X-ray follow up Contact information: [address] [place] [address] [phone]              Contact information for after-discharge care     Destination     [facility] .   Service: Skilled Nursing Contact information: [address] [place] [address] [phone]                    No pain   Discharge Exam: Ceasar Mons Weights   08/23/23 1018  Weight: 75.8 kg   Physical Exam Vitals reviewed.   Constitutional:      General: He is not in acute distress.    Appearance: He is not ill-appearing or toxic-appearing.  Cardiovascular:     Rate and Rhythm: Normal rate and regular rhythm.     Heart sounds: No murmur heard. Pulmonary:     Effort: No respiratory distress.     Breath sounds: No wheezing or rales.  Musculoskeletal:     Right lower leg: No edema.     Left lower leg: No edema.     Comments: Raises both legs off the bed without apparent pain.  Neurological:     Mental Status: He is alert.      Condition at discharge: good  The results of significant diagnostics from this hospitalization (including imaging, microbiology, ancillary and laboratory) are listed below for reference.   Imaging Studies: DG Hip Unilat W or Wo Pelvis 2-3 Views Right  Result Date: 08/23/2023 CLINICAL DATA:  89 year old male status post fall 2 days ago with hip pain. EXAM: DG HIP (WITH OR WITHOUT PELVIS) 2-3V RIGHT COMPARISON:  Pelvis radiographs 06/12/2012. FINDINGS: AP views of the pelvis and right hip at 1055 hours. Chronic bilateral total hip arthroplasty. Chronic prostate brachytherapy. Bulky heterotopic ossification  about the proximal left femur which has progressed since 2013. Pelvis appears stable and intact. SI joints are symmetric. Nonobstructed bowel-gas pattern. Evidence of acute periprosthetic fracture of the proximal right femur most apparent at the greater trochanter. Minimally displaced fracture. Maintained AP hip arthroplasty alignment there. IMPRESSION: 1. Suspicious for acute periprosthetic fracture at the proximal right femur intertrochanteric segment. 2. No other acute osseous abnormality identified about the pelvis. Electronically Signed   By: Odessa Fleming M.D.   On: 08/23/2023 11:33    Microbiology: Results for orders placed or performed in visit on 06/19/20  Fecal occult blood, imunochemical([facility])     Status: Abnormal   Collection Time: 06/19/20  4:11 PM   Specimen:  Stool  Result Value Ref Range Status   Fecal Occult Bld Positive (A) Negative Final    Labs: CBC: Recent Labs  Lab 08/23/23 1629 08/24/23 0805  WBC 6.7 7.5  NEUTROABS 1.9  --   HGB 8.1* 8.2*  HCT 25.7* 26.0*  MCV 110.3* 108.3*  PLT 98* 86*   Basic Metabolic Panel: Recent Labs  Lab 08/23/23 1629 08/24/23 0805  NA 141 136  K 3.5 3.2*  CL 109 104  CO2 24 24  GLUCOSE 100* 149*  BUN 18 20  CREATININE 1.10 0.98  CALCIUM 9.1 9.2   Liver Function Tests: No results for input(s): "AST", "ALT", "ALKPHOS", "BILITOT", "PROT", "ALBUMIN" in the last 168 hours. CBG: No results for input(s): "GLUCAP" in the last 168 hours.  Discharge time spent: greater than 30 minutes.  Signed: Brendia Sacks, MD [facility] 08/26/2023

## 2023-08-28 ENCOUNTER — Inpatient Hospital Stay: Payer: Medicare Other | Attending: Hematology & Oncology

## 2023-08-28 ENCOUNTER — Inpatient Hospital Stay: Payer: Medicare Other

## 2023-08-28 DIAGNOSIS — F03B3 Unspecified dementia, moderate, with mood disturbance: Secondary | ICD-10-CM | POA: Diagnosis not present

## 2023-08-28 DIAGNOSIS — F32A Depression, unspecified: Secondary | ICD-10-CM | POA: Diagnosis not present

## 2023-08-28 DIAGNOSIS — D649 Anemia, unspecified: Secondary | ICD-10-CM | POA: Diagnosis not present

## 2023-08-28 DIAGNOSIS — F39 Unspecified mood [affective] disorder: Secondary | ICD-10-CM | POA: Diagnosis not present

## 2023-08-28 DIAGNOSIS — I10 Essential (primary) hypertension: Secondary | ICD-10-CM | POA: Diagnosis not present

## 2023-08-28 DIAGNOSIS — S72001D Fracture of unspecified part of neck of right femur, subsequent encounter for closed fracture with routine healing: Secondary | ICD-10-CM | POA: Diagnosis not present

## 2023-08-28 DIAGNOSIS — R29898 Other symptoms and signs involving the musculoskeletal system: Secondary | ICD-10-CM | POA: Diagnosis not present

## 2023-08-28 DIAGNOSIS — Z9181 History of falling: Secondary | ICD-10-CM | POA: Diagnosis not present

## 2023-08-28 DIAGNOSIS — C9210 Chronic myeloid leukemia, BCR/ABL-positive, not having achieved remission: Secondary | ICD-10-CM | POA: Diagnosis not present

## 2023-08-28 DIAGNOSIS — M353 Polymyalgia rheumatica: Secondary | ICD-10-CM | POA: Diagnosis not present

## 2023-08-28 DIAGNOSIS — R262 Difficulty in walking, not elsewhere classified: Secondary | ICD-10-CM | POA: Diagnosis not present

## 2023-08-30 ENCOUNTER — Other Ambulatory Visit: Payer: Self-pay | Admitting: *Deleted

## 2023-08-30 NOTE — Patient Outreach (Signed)
Verified with Fredia Beets social worker Mr. Kavanagh admitted on 08/26/23.   Mr. Pascuzzi admitted under [facility] SNF waiver.   Whitney reports anticipated transition plan is ALF.   Will continue to follow.   Raiford Noble, MSN, RN, BSN [place]  [facility] RN Post- Acute Care Manager Direct Dial: [phone]

## 2023-09-04 ENCOUNTER — Telehealth: Payer: Self-pay

## 2023-09-04 DIAGNOSIS — S72001D Fracture of unspecified part of neck of right femur, subsequent encounter for closed fracture with routine healing: Secondary | ICD-10-CM | POA: Diagnosis not present

## 2023-09-04 NOTE — Telephone Encounter (Signed)
Pt son called in he is worried about his father he thinks that the increase in donepezil may be causing hallucinations, he is seeing think on the walls and ceilings. Pt son is asking is this the medication or the next step in dementia? Pt is at [facility] [employer] [facility] [phone] Ladona Mow is Child psychotherapist there. Pt has fallen an has a broken hip Pt son would like to speak to Huntley Dec [phone] pt son is Andrew Ramos It Pt is disoriented confused and having increased sleepiness.

## 2023-09-04 NOTE — Telephone Encounter (Signed)
Pt son called informed that Huntley Dec would like to Hold the donepezil and observe.It could be a constellation of things.  Hallucinations may also  be caused by UTIs, and medications given for his fracture, pain meds .

## 2023-09-07 ENCOUNTER — Telehealth: Payer: Self-pay | Admitting: Physician Assistant

## 2023-09-07 NOTE — Telephone Encounter (Signed)
Pt son spoke with Herbert Seta last week or early this week ( he could not remember the date ) he states that [facility] has not received any thing from [place] and they are waiting on the fax that he spoke with our office about. He wanted to make sure we had the correct fax number for them.  The fax number is [phone]. He would like [place] to refax the information to them.

## 2023-09-07 NOTE — Telephone Encounter (Signed)
Information faxed to [facility] at [phone]. Pt son called an informed

## 2023-09-08 DIAGNOSIS — M9701XA Periprosthetic fracture around internal prosthetic right hip joint, initial encounter: Secondary | ICD-10-CM | POA: Diagnosis not present

## 2023-09-08 DIAGNOSIS — Z96641 Presence of right artificial hip joint: Secondary | ICD-10-CM | POA: Diagnosis not present

## 2023-09-10 DIAGNOSIS — Z79899 Other long term (current) drug therapy: Secondary | ICD-10-CM | POA: Diagnosis not present

## 2023-09-10 DIAGNOSIS — I499 Cardiac arrhythmia, unspecified: Secondary | ICD-10-CM | POA: Diagnosis not present

## 2023-09-10 DIAGNOSIS — I4891 Unspecified atrial fibrillation: Secondary | ICD-10-CM | POA: Diagnosis not present

## 2023-09-10 DIAGNOSIS — I469 Cardiac arrest, cause unspecified: Secondary | ICD-10-CM | POA: Diagnosis not present

## 2023-09-10 DIAGNOSIS — E161 Other hypoglycemia: Secondary | ICD-10-CM | POA: Diagnosis not present

## 2023-09-10 DIAGNOSIS — E162 Hypoglycemia, unspecified: Secondary | ICD-10-CM | POA: Diagnosis not present

## 2023-09-10 DIAGNOSIS — I4510 Unspecified right bundle-branch block: Secondary | ICD-10-CM | POA: Diagnosis not present

## 2023-09-20 DEATH — deceased

## 2023-10-05 ENCOUNTER — Ambulatory Visit: Payer: Medicare Other | Admitting: Physician Assistant

## 2023-10-06 ENCOUNTER — Ambulatory Visit: Payer: Medicare Other | Admitting: Physician Assistant

## 2023-10-23 ENCOUNTER — Ambulatory Visit: Payer: Medicare Other | Admitting: Family Medicine
